# Patient Record
Sex: Male | Born: 1960 | Race: White | Hispanic: No | Smoking: Former smoker
Health system: Southern US, Community
[De-identification: ages and names within clinical notes are randomized; demographics above are authoritative.]

## PROBLEM LIST (undated history)

## (undated) DIAGNOSIS — Z9989 Dependence on other enabling machines and devices: Secondary | ICD-10-CM

## (undated) DIAGNOSIS — F909 Attention-deficit hyperactivity disorder, unspecified type: Secondary | ICD-10-CM

## (undated) DIAGNOSIS — F1010 Alcohol abuse, uncomplicated: Secondary | ICD-10-CM

## (undated) DIAGNOSIS — Z8719 Personal history of other diseases of the digestive system: Secondary | ICD-10-CM

## (undated) DIAGNOSIS — I209 Angina pectoris, unspecified: Secondary | ICD-10-CM

## (undated) DIAGNOSIS — J302 Other seasonal allergic rhinitis: Secondary | ICD-10-CM

## (undated) DIAGNOSIS — I701 Atherosclerosis of renal artery: Secondary | ICD-10-CM

## (undated) DIAGNOSIS — N186 End stage renal disease: Secondary | ICD-10-CM

## (undated) DIAGNOSIS — N281 Cyst of kidney, acquired: Secondary | ICD-10-CM

## (undated) DIAGNOSIS — E785 Hyperlipidemia, unspecified: Secondary | ICD-10-CM

## (undated) DIAGNOSIS — M009 Pyogenic arthritis, unspecified: Secondary | ICD-10-CM

## (undated) DIAGNOSIS — I739 Peripheral vascular disease, unspecified: Secondary | ICD-10-CM

## (undated) DIAGNOSIS — K759 Inflammatory liver disease, unspecified: Secondary | ICD-10-CM

## (undated) DIAGNOSIS — Z8711 Personal history of peptic ulcer disease: Secondary | ICD-10-CM

## (undated) DIAGNOSIS — G4733 Obstructive sleep apnea (adult) (pediatric): Secondary | ICD-10-CM

## (undated) DIAGNOSIS — G8929 Other chronic pain: Secondary | ICD-10-CM

## (undated) DIAGNOSIS — M545 Low back pain, unspecified: Secondary | ICD-10-CM

## (undated) DIAGNOSIS — M1990 Unspecified osteoarthritis, unspecified site: Secondary | ICD-10-CM

## (undated) DIAGNOSIS — J189 Pneumonia, unspecified organism: Secondary | ICD-10-CM

## (undated) DIAGNOSIS — I509 Heart failure, unspecified: Secondary | ICD-10-CM

## (undated) DIAGNOSIS — F32A Depression, unspecified: Secondary | ICD-10-CM

## (undated) DIAGNOSIS — I10 Essential (primary) hypertension: Secondary | ICD-10-CM

## (undated) DIAGNOSIS — F329 Major depressive disorder, single episode, unspecified: Secondary | ICD-10-CM

## (undated) DIAGNOSIS — F419 Anxiety disorder, unspecified: Secondary | ICD-10-CM

## (undated) DIAGNOSIS — Z992 Dependence on renal dialysis: Secondary | ICD-10-CM

## (undated) DIAGNOSIS — J449 Chronic obstructive pulmonary disease, unspecified: Secondary | ICD-10-CM

---
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
## 2011-03-24 ENCOUNTER — Encounter (HOSPITAL_COMMUNITY): Payer: Self-pay | Admitting: Nephrology

## 2011-03-24 ENCOUNTER — Inpatient Hospital Stay (HOSPITAL_COMMUNITY)
Admission: AD | Admit: 2011-03-24 | Discharge: 2011-03-29 | DRG: 292 | Disposition: A | Discharge status: Home / Self Care | Payer: Medicaid Other | Source: Ambulatory Visit | Attending: Internal Medicine | Admitting: Internal Medicine

## 2011-03-24 DIAGNOSIS — N183 Chronic kidney disease, stage 3 unspecified: Secondary | ICD-10-CM | POA: Diagnosis present

## 2011-03-24 DIAGNOSIS — I129 Hypertensive chronic kidney disease with stage 1 through stage 4 chronic kidney disease, or unspecified chronic kidney disease: Secondary | ICD-10-CM | POA: Diagnosis present

## 2011-03-24 DIAGNOSIS — M545 Low back pain, unspecified: Secondary | ICD-10-CM | POA: Diagnosis not present

## 2011-03-24 DIAGNOSIS — E785 Hyperlipidemia, unspecified: Secondary | ICD-10-CM | POA: Diagnosis present

## 2011-03-24 DIAGNOSIS — N179 Acute kidney failure, unspecified: Secondary | ICD-10-CM | POA: Diagnosis present

## 2011-03-24 DIAGNOSIS — N189 Chronic kidney disease, unspecified: Secondary | ICD-10-CM | POA: Diagnosis present

## 2011-03-24 DIAGNOSIS — Q619 Cystic kidney disease, unspecified: Secondary | ICD-10-CM

## 2011-03-24 DIAGNOSIS — J4489 Other specified chronic obstructive pulmonary disease: Secondary | ICD-10-CM | POA: Diagnosis present

## 2011-03-24 DIAGNOSIS — N186 End stage renal disease: Secondary | ICD-10-CM | POA: Insufficient documentation

## 2011-03-24 DIAGNOSIS — Z91199 Patient's noncompliance with other medical treatment and regimen due to unspecified reason: Secondary | ICD-10-CM

## 2011-03-24 DIAGNOSIS — I10 Essential (primary) hypertension: Secondary | ICD-10-CM | POA: Insufficient documentation

## 2011-03-24 DIAGNOSIS — I739 Peripheral vascular disease, unspecified: Secondary | ICD-10-CM | POA: Diagnosis present

## 2011-03-24 DIAGNOSIS — R109 Unspecified abdominal pain: Secondary | ICD-10-CM | POA: Diagnosis not present

## 2011-03-24 DIAGNOSIS — Z9119 Patient's noncompliance with other medical treatment and regimen: Secondary | ICD-10-CM

## 2011-03-24 DIAGNOSIS — I5023 Acute on chronic systolic (congestive) heart failure: Principal | ICD-10-CM | POA: Diagnosis present

## 2011-03-24 DIAGNOSIS — K5900 Constipation, unspecified: Secondary | ICD-10-CM | POA: Diagnosis not present

## 2011-03-24 DIAGNOSIS — F1010 Alcohol abuse, uncomplicated: Secondary | ICD-10-CM | POA: Insufficient documentation

## 2011-03-24 DIAGNOSIS — I509 Heart failure, unspecified: Secondary | ICD-10-CM | POA: Diagnosis present

## 2011-03-24 DIAGNOSIS — F1021 Alcohol dependence, in remission: Secondary | ICD-10-CM | POA: Diagnosis present

## 2011-03-24 DIAGNOSIS — J449 Chronic obstructive pulmonary disease, unspecified: Secondary | ICD-10-CM | POA: Diagnosis present

## 2011-03-24 DIAGNOSIS — F17200 Nicotine dependence, unspecified, uncomplicated: Secondary | ICD-10-CM | POA: Diagnosis present

## 2011-03-24 DIAGNOSIS — G894 Chronic pain syndrome: Secondary | ICD-10-CM | POA: Diagnosis present

## 2011-03-24 HISTORY — DX: Chronic obstructive pulmonary disease, unspecified: J44.9

## 2011-03-24 HISTORY — DX: Alcohol abuse, uncomplicated: F10.10

## 2011-03-24 HISTORY — DX: Hyperlipidemia, unspecified: E78.5

## 2011-03-24 HISTORY — DX: Peripheral vascular disease, unspecified: I73.9

## 2011-03-24 HISTORY — DX: Essential (primary) hypertension: I10

## 2011-03-24 LAB — CARDIAC PANEL(CRET KIN+CKTOT+MB+TROPI)
CK, MB: 5.2 ng/mL — ABNORMAL HIGH (ref 0.3–4.0)
Relative Index: 3.3 — ABNORMAL HIGH (ref 0.0–2.5)
Troponin I: <0.3 ng/mL (ref <0.30)

## 2011-03-24 LAB — CREATININE, SERUM
Creatinine, Ser: 3.3 mg/dL — ABNORMAL HIGH (ref 0.50–1.35)
GFR calc Af Amer: 24 mL/min — ABNORMAL LOW (ref >90)

## 2011-03-24 LAB — GLUCOSE, CAPILLARY
Glucose-Capillary: 173 mg/dL — ABNORMAL HIGH (ref 70–99)
Glucose-Capillary: 152 mg/dL — ABNORMAL HIGH (ref 70–99)

## 2011-03-24 LAB — CBC
MCV: 91.7 fL (ref 78.0–100.0)
Platelets: 222 10*3/uL (ref 150–400)
RBC: 4.1 MIL/uL — ABNORMAL LOW (ref 4.22–5.81)
RDW: 13.8 % (ref 11.5–15.5)
WBC: 9.2 10*3/uL (ref 4.0–10.5)

## 2011-03-24 MED ORDER — ISOSORBIDE DINITRATE 10 MG PO TABS
10.0000 mg | ORAL_TABLET | Freq: Three times a day (TID) | ORAL | Status: DC
  Administered 2011-03-24 – 2011-03-29 (×14): 10 mg via ORAL
  Filled 2011-03-24 (×19): qty 1

## 2011-03-24 MED ORDER — MORPHINE SULFATE 2 MG/ML IJ SOLN
2.0000 mg | INTRAMUSCULAR | Status: DC | PRN
  Administered 2011-03-24 – 2011-03-26 (×6): 2 mg via INTRAVENOUS
  Filled 2011-03-24 (×7): qty 1

## 2011-03-24 MED ORDER — ALBUTEROL SULFATE (5 MG/ML) 0.5% IN NEBU
2.5000 mg | INHALATION_SOLUTION | RESPIRATORY_TRACT | Status: DC | PRN
  Administered 2011-03-24 – 2011-03-28 (×5): 2.5 mg via RESPIRATORY_TRACT
  Filled 2011-03-24 (×5): qty 0.5

## 2011-03-24 MED ORDER — ZOLPIDEM TARTRATE 5 MG PO TABS
5.0000 mg | ORAL_TABLET | Freq: Every evening | ORAL | Status: DC | PRN
  Administered 2011-03-26 – 2011-03-29 (×4): 5 mg via ORAL
  Filled 2011-03-24 (×4): qty 1

## 2011-03-24 MED ORDER — DEXTROSE 5 % IV SOLN
160.0000 mg | Freq: Two times a day (BID) | INTRAVENOUS | Status: DC
  Administered 2011-03-24 – 2011-03-26 (×4): 160 mg via INTRAVENOUS
  Filled 2011-03-24 (×5): qty 16

## 2011-03-24 MED ORDER — HEPARIN SODIUM (PORCINE) 5000 UNIT/ML IJ SOLN
5000.0000 [IU] | Freq: Three times a day (TID) | INTRAMUSCULAR | Status: DC
Start: 2011-03-24 — End: 2011-03-29
  Administered 2011-03-24 – 2011-03-29 (×15): 5000 [IU] via SUBCUTANEOUS
  Filled 2011-03-24 (×17): qty 1

## 2011-03-24 MED ORDER — HYDRALAZINE HCL 10 MG PO TABS
10.0000 mg | ORAL_TABLET | Freq: Two times a day (BID) | ORAL | Status: DC
  Administered 2011-03-24 – 2011-03-29 (×10): 10 mg via ORAL
  Filled 2011-03-24 (×13): qty 1

## 2011-03-24 MED ORDER — CARVEDILOL 25 MG PO TABS
25.0000 mg | ORAL_TABLET | Freq: Two times a day (BID) | ORAL | Status: DC
  Administered 2011-03-24 – 2011-03-29 (×10): 25 mg via ORAL
  Filled 2011-03-24 (×12): qty 1

## 2011-03-24 MED ORDER — ALPRAZOLAM 0.5 MG PO TABS
0.5000 mg | ORAL_TABLET | Freq: Three times a day (TID) | ORAL | Status: DC | PRN
  Administered 2011-03-24 – 2011-03-29 (×10): 0.5 mg via ORAL
  Filled 2011-03-24 (×10): qty 1

## 2011-03-24 NOTE — Progress Notes (Signed)
Subjective:  Patient is a 50 yo WM with PMhx most notable for CHF, patient tells me EF of 25-30%.  He also has CKD stage 3 with a baseline creatinine of 1.5-2.  He has been hospitalized at [facility] with CHF, treated with a continuous IV lasix infusion.  His CHF is felt to be secondary to noncompliance.  The patient was seen by Dr. Caryn Section from [facility] at [facility] on 11/12.  Please see his note for details regarding past history/SH/FH.  At the time of consultation, pts creatinine was up to 4, but actually has trended down the last couple of days.  Today crt was 3.14 but with BUN of 121.   Pt right now is c/o thirst and still has significant LE edema, but states it is better than when he presented Objective Vital signs in last 24 hours: Filed Vitals:   03/24/11 1535  BP: 143/96  Pulse: 86  Temp: 97.4 F (36.3 C)  TempSrc: Oral  Resp: 18  Height: 5\' 8"  (1.727 m)  Weight: 78.4 kg (172 lb 13.5 oz)  SpO2: 99%   Weight change:  No intake or output data in the 24 hours ending 03/24/11 1625 Labs: Basic Metabolic Panel: No results found for this basename: NA:3,K:3,CL:3,CO2:3,GLUCOSE:3,BUN:3,CREATININE:3,CALCIUM:3,ALB:3,PHOS:3 in the last 168 hours Liver Function Tests: No results found for this basename: AST:3,ALT:3,ALKPHOS:3,BILITOT:3,PROT:3,ALBUMIN:3 in the last 168 hours No results found for this basename: LIPASE:3,AMYLASE:3 in the last 168 hours No results found for this basename: AMMONIA:3 in the last 168 hours CBC: No results found for this basename: WBC:3,NEUTROABS:3,HGB:3,HCT:3,MCV:5,PLT:3 in the last 168 hours Cardiac Enzymes: No results found for this basename: CKTOTAL:5,CKMB:5,CKMBINDEX:5,TROPONINI:5 in the last 168 hours CBG: No results found for this basename: GLUCAP:5 in the last 168 hours  Iron Studies: No results found for this basename: IRON,TIBC,TRANSFERRIN,FERRITIN in the last 72 hours Studies/Results: No results found. Medications:  From [facility] Neurontin 100 BID solumdrol 60 mg IV q6 Hydralazine 50 q 8 hrs atrovent Isordil 20 mg q 8 hrs Furosemide infusion Azithromycin 500 mg daily protonix 40 daily xopenex Coreg 25 mg BID PRN narcotics     have reviewed scheduled and prn medications.  Physical Exam: General: pleasant wm, NAD, c/o thirst Heart:RRR Lungs: actually mostly clear Abdomen:soft, nt Extremities:2-3 plus pitting edema, pt self reports scrotal edema   I Assessment/ Plan: Pt is a 50 y.o. yo male who was admitted on 03/24/2011 with  CHF exacerbation as well as A on CRF  Assessment/Plan: 1.CHF- actually has improved during his hospitalization, renal function appears to be trending in hte right direction as well.  I am going to continue to treat with IV lasix and monitor the situation  2. A of CRF- may be trending for the better. No acute indications for HD at this time. SPEP and UPEP were negative at [place].  Proteinuria was quantified at 2 grams 3. Anemia- the early hgb was good, will follow  Dang Mathison A   03/24/2011,4:25 PM  LOS: 0 days

## 2011-03-24 NOTE — H&P (Signed)
[name] is an 50 y.o. male.   Chief Complaint: Patient transferred from [facility] for worsening renal function HPI This is a 50 year old male with a history of chronic systolic congestive heart failure, noncompliance with medications, chronic kidney disease stage III, patient transfer from [facility] after being treated for congestive heart failure with continuous IV Lasix. The patient ran out of his coreg. He was not not monitoring his weight and he noticed over 20 pounds weight gain, also  shortness of breath and accordingly presented to [facility] ,at [facility] patient treated with IV lasix continuously and fluid restriction. Patient was on the Lasix at the 5 mg per hour and also placed on Coreg 12.5 mg twice daily but noticed elevated creatinine.accordingly patient transfer for better controll and monitoring of his kidney Past Medical History  Diagnosis Date  . Hypertension   . Congestive heart failure (CHF)   . Chronic kidney disease (CKD), stage III (moderate)   . Hyperlipidemia   . COPD (chronic obstructive pulmonary disease)   . Peripheral vascular disease   . ETOH abuse     No past surgical history on file.   family history: Positive for father who is deceased with pancreatic cancer and a mother with a brain tumor. He is prepared in a grandfather and uncle have died with congestive heart failure and heart disease, he has a strong family history of chronic alcoholism Social History: positive for tobacco abus1-2 packs per day., quit drinking 9 month ago, denies any illicit drug abuse Allergies: No Known Allergies Medication at home alprazolam 0.5 mg every 6 hour when necessary,frusemide 40 mg twice daily , Coreg 25 mg twice daily number, isosorbide dinitrate 10 mg twice daily Medications Prior to Admission  Medication Dose Route Frequency Provider Last Rate Last Dose  . furosemide (LASIX) 160 mg in dextrose 5 % 50 mL IVPB  160 mg Intravenous Q12H  Kellie A [place]       No current outpatient prescriptions on file as of 03/24/2011.    Results for orders placed during the hospital encounter of 03/24/11 (from the past 48 hour(s))  GLUCOSE, CAPILLARY     Status: Abnormal   Collection Time   03/24/11  3:36 PM      Component Value Range Comment   Glucose-Capillary 173 (*) 70 - 99 (mg/dL)   GLUCOSE, CAPILLARY     Status: Abnormal   Collection Time   03/24/11  5:31 PM      Component Value Range Comment   Glucose-Capillary 152 (*) 70 - 99 (mg/dL)    Comment 1 Documented in Chart      Comment 2 Notify RN      No results found.  Review of Systems  Constitutional: Positive for diaphoresis. Negative for fever, chills, weight loss and malaise/fatigue.  HENT: Negative for hearing loss, congestion, sore throat, neck pain, tinnitus and ear discharge.   Eyes: Negative for double vision, photophobia, pain and redness.  Respiratory: Positive for shortness of breath. Negative for cough, hemoptysis, sputum production, wheezing and stridor.   Cardiovascular: Positive for chest pain, palpitations, orthopnea, leg swelling and PND.  Gastrointestinal: Negative for heartburn, nausea, vomiting, abdominal pain, diarrhea and melena.  Genitourinary: Negative for dysuria, urgency, frequency, hematuria and flank pain.  Musculoskeletal: Negative for myalgias, back pain, joint pain and falls.  Skin: Negative for itching and rash.  Neurological: Negative for dizziness, tingling, tremors, speech change, focal weakness, seizures, loss of consciousness, weakness and headaches.  Endo/Heme/Allergies: Positive for environmental  allergies. Negative for polydipsia. Does not bruise/bleed easily.    Blood pressure 147/89, pulse 82, temperature 97.6 F (36.4 C), temperature source Oral, resp. rate 19, height 5\' 8"  (1.727 m), weight 78.4 kg (172 lb 13.5 oz), SpO2 99.00%. Physical Exam  Constitutional:       On mild respiratory distress, on nasal cannula  HENT:    Head: Normocephalic and atraumatic.  Right Ear: External ear normal.  Left Ear: External ear normal.  Eyes: Pupils are equal, round, and reactive to light. Right eye exhibits no discharge. Left eye exhibits no discharge.  Neck: Normal range of motion. JVD present. No tracheal deviation present. No thyromegaly present.  Cardiovascular: Normal rate, regular rhythm and normal heart sounds.  Exam reveals no gallop and no friction rub.   No murmur heard. Respiratory: He is in respiratory distress. He has no wheezes. He has rales. He exhibits no tenderness.       Bilatera]l rales  GI: Bowel sounds are normal. He exhibits no distension and no mass. There is no tenderness. There is no rebound and no guarding.  Musculoskeletal: He exhibits edema.       Bilateral oedema , stage4  Lymphadenopathy:    He has no cervical adenopathy.  Skin: He is diaphoretic.     Assessment/Plan 1-acute on chronic congestive heart failure agree with the Dr. Edmonia Lynch evaluation would continue with Lasix IV, coreg 12.5 mg twice daily, Imdur 10 mg bid, would get 2-D echo on 5 inpatient cardiac enzyme, EKG would be ordered . 2-acute and chronic renal failure petr the nephrology team, SPEP, UPEP was negative the [facility]  3- COPD with ongoing tobacco abuse counseling, and nebs , no steroid 4-hypertension resume coreg and imdur as compared note from [facility] the patient has an EF of 27% I would like to add hydralazine to his medications and currently would hold aldactonepending improvement  kidney function.   Kimyata Milich I. 03/24/2011, 6:12 PM

## 2011-03-25 ENCOUNTER — Inpatient Hospital Stay (HOSPITAL_COMMUNITY): Payer: Medicaid Other

## 2011-03-25 ENCOUNTER — Other Ambulatory Visit: Payer: Self-pay

## 2011-03-25 ENCOUNTER — Encounter (HOSPITAL_COMMUNITY): Payer: Self-pay | Admitting: *Deleted

## 2011-03-25 DIAGNOSIS — I059 Rheumatic mitral valve disease, unspecified: Secondary | ICD-10-CM

## 2011-03-25 LAB — CBC
HCT: 38.4 % — ABNORMAL LOW (ref 39.0–52.0)
Platelets: 222 10*3/uL (ref 150–400)
RBC: 4.17 MIL/uL — ABNORMAL LOW (ref 4.22–5.81)
RDW: 13.7 % (ref 11.5–15.5)
WBC: 9.9 10*3/uL (ref 4.0–10.5)

## 2011-03-25 LAB — COMPREHENSIVE METABOLIC PANEL
ALT: 47 U/L (ref 0–53)
AST: 19 U/L (ref 0–37)
CO2: 36 mEq/L — ABNORMAL HIGH (ref 19–32)
Calcium: 8.4 mg/dL (ref 8.4–10.5)
Creatinine, Ser: 3.21 mg/dL — ABNORMAL HIGH (ref 0.50–1.35)
GFR calc Af Amer: 24 mL/min — ABNORMAL LOW (ref >90)
GFR calc non Af Amer: 21 mL/min — ABNORMAL LOW (ref >90)
Sodium: 139 mEq/L (ref 135–145)
Total Protein: 5.7 g/dL — ABNORMAL LOW (ref 6.0–8.3)

## 2011-03-25 LAB — GLUCOSE, CAPILLARY
Glucose-Capillary: 133 mg/dL — ABNORMAL HIGH (ref 70–99)
Glucose-Capillary: 140 mg/dL — ABNORMAL HIGH (ref 70–99)
Glucose-Capillary: 142 mg/dL — ABNORMAL HIGH (ref 70–99)
Glucose-Capillary: 87 mg/dL (ref 70–99)

## 2011-03-25 LAB — PHOSPHORUS: Phosphorus: 5.7 mg/dL — ABNORMAL HIGH (ref 2.3–4.6)

## 2011-03-25 LAB — CARDIAC PANEL(CRET KIN+CKTOT+MB+TROPI)
CK, MB: 5.6 ng/mL — ABNORMAL HIGH (ref 0.3–4.0)
Troponin I: <0.3 ng/mL (ref <0.30)

## 2011-03-25 MED ORDER — INFLUENZA VIRUS VACC SPLIT PF IM SUSP
0.5000 mL | INTRAMUSCULAR | Status: AC
  Administered 2011-03-26: 0.5 mL via INTRAMUSCULAR
  Filled 2011-03-25: qty 0.5

## 2011-03-25 MED ORDER — HYDROCODONE-ACETAMINOPHEN 5-325 MG PO TABS
1.0000 | ORAL_TABLET | ORAL | Status: DC | PRN
  Administered 2011-03-25: 1 via ORAL
  Administered 2011-03-26 – 2011-03-29 (×11): 2 via ORAL
  Filled 2011-03-25 (×5): qty 2
  Filled 2011-03-25: qty 1
  Filled 2011-03-25 (×7): qty 2

## 2011-03-25 MED ORDER — ASPIRIN EC 325 MG PO TBEC
325.0000 mg | DELAYED_RELEASE_TABLET | Freq: Every day | ORAL | Status: DC
  Administered 2011-03-25 – 2011-03-29 (×5): 325 mg via ORAL
  Filled 2011-03-25 (×5): qty 1

## 2011-03-25 MED ORDER — PNEUMOCOCCAL VAC POLYVALENT 25 MCG/0.5ML IJ INJ
0.5000 mL | INJECTION | INTRAMUSCULAR | Status: AC
  Administered 2011-03-26: 0.5 mL via INTRAMUSCULAR
  Filled 2011-03-25: qty 0.5

## 2011-03-25 NOTE — Progress Notes (Signed)
Patient c/o chest pain at 1830 of a "2" of "10" on pain scale.  The pain was non radiating and sharp that was much more painful upon palpation of the muscles in the middle of the chest.  Vss, 12 lead ekg was done and dr elsaid was made aware and vicoden was given x 1.  Continue to monitor

## 2011-03-25 NOTE — Progress Notes (Signed)
Utilization review complete 

## 2011-03-25 NOTE — Progress Notes (Signed)
Inpatient Diabetes Program Recommendations  AACE/ADA: New Consensus Statement on Inpatient Glycemic Control (2009)  Target Ranges:  Prepandial:   less than 140 mg/dL      Peak postprandial:   less than 180 mg/dL (1-2 hours)      Critically ill patients:  140 - 180 mg/dL   Reason for Visit: monitoring CBGs and no correction insulin    Consider starting Novolog sensitive scale TID for elevated CBGs.

## 2011-03-25 NOTE — Progress Notes (Signed)
  Echocardiogram 2D Echocardiogram has been performed.  Dewitt Hoes, RDCS 03/25/2011, 11:23 AM

## 2011-03-25 NOTE — Progress Notes (Signed)
Subjective: Today patient complained of abdominal pain, last bowel movement was yesterday, tolerated a diet well, no evidence of nausea or vomiting, also complained of pain on his both lower extremities and back. Patient informed me that he had an MRI of his back done [place]. Looking at the the report from [place] I don't see any MRI of his back. Patient able to mobilize both lower extremities he is making good urine.  Objective: Vital signs in last 24 hours: Filed Vitals:   03/24/11 2100 03/25/11 0553 03/25/11 0900 03/25/11 1400  BP: 168/117 139/91 160/80 144/70  Pulse: 97 94 87 80  Temp: 97.6 F (36.4 C) 97.6 F (36.4 C) 98 F (36.7 C) 97.5 F (36.4 C)  TempSrc: Oral  Oral Oral  Resp: 20 20 20 20   Height:      Weight:  78.6 kg (173 lb 4.5 oz)    SpO2: 98% 100% 100% 100%   Weight change:   Intake/Output Summary (Last 24 hours) at 03/25/11 1600 Last data filed at 03/25/11 1500  Gross per 24 hour  Intake    900 ml  Output    725 ml  Net    175 ml  On examination  The patient not on active respiratory distress,  Pupil equally "reactive to light and accommodation  heart S1 and S2 no gallop Lungs no Rales no wheezing Abdomen distended tender to palpation over bowel sounds present no rebound no guarding, ecchymosis to the suprapubic area Extremities bilateral lower limb edema improve since yesterday CNS patient awake alert oriented x3 moves of both lower extremities, pulses intact bilateral back examined no point tenderness. Lab Results:  [facility] 03/25/11 0308 03/24/11 1837  NA 139 --  K 3.6 --  CL 89* --  CO2 36* --  GLUCOSE 142* --  BUN 111* --  CREATININE 3.21* 3.30*  CALCIUM 8.4 --  MG 2.1 --  PHOS 5.7* --    Basename 03/25/11 0308  AST 19  ALT 47  ALKPHOS 58  BILITOT 0.5  PROT 5.7*  ALBUMIN 3.1*   No results found for this basename: LIPASE:2,AMYLASE:2 in the last 72 hours  Basename 03/25/11 0308 03/24/11 1837  WBC 9.9 9.2  NEUTROABS -- --  HGB 12.8*  12.3*  HCT 38.4* 37.6*  MCV 92.1 91.7  PLT 222 222    Basename 03/25/11 1037 03/25/11 0308 03/24/11 1832  CKTOTAL 151 127 160  CKMB 5.6* 5.1* 5.2*  CKMBINDEX -- -- --  TROPONINI <0.30 <0.30 <0.30   Micro Results: Recent Results (from the past 240 hour(s))  MRSA PCR SCREENING     Status: Normal   Collection Time   03/24/11  3:58 PM      Component Value Range Status Comment   MRSA by PCR NEGATIVE  NEGATIVE  Final     Studies/Results: No results found.  Medications: I have reviewed the patient's current medications. Scheduled Meds:   . carvedilol  25 mg Oral BID  . furosemide  160 mg Intravenous Q12H  . heparin  5,000 Units Subcutaneous Q8H  . hydrALAZINE  10 mg Oral BID  . influenza  inactive virus vaccine  0.5 mL Intramuscular Tomorrow-1000  . isosorbide dinitrate  10 mg Oral TID  . pneumococcal 23 valent vaccine  0.5 mL Intramuscular Tomorrow-1000   Continuous Infusions:  PRN Meds:.albuterol, ALPRAZolam, morphine injection, zolpidem  Assessment/Plan: Principal Problem:  *Congestive heart failure (CHF) acute on chronic systolic CHF with EF 30%, history of of alcoholism, has never had cardiac caths in  the past would get more history from his cardiologist, may need a cardiac cath in the hospital before discharge, cardiac enzymes currently negative and patient denies any chest pain, would continue with Coreg 25 mg twice daily, hydralazine 10 mg twice daily, Imdur 2 or 3 times daily, a CEA inhibitor on hold secondary to renal insufficiency. 2-acute on chronic renal insufficiency per nephrology team 3-abdomen  pain and distention patient has a history of alcoholism, cannot exclude ascites s and liver cirrhosis I would proceed with ultrasound of the abdomen, to give [place] a picture about the liver and kidney, would also get KUB to exclude any ileus 4- patient also complained of back pain he admitted he has an MRI done at [facility] we will get the report, would get ESR and  C-reactive protein. 5-COPD currently no wheezing would continue with nebs no need for steroids at this time. -  LOS: 1 day  Aaralyn Kil I. 03/25/2011, 4:00 PM

## 2011-03-25 NOTE — Progress Notes (Signed)
Subjective:  Not much UOP recorded, but patient said "i filled that jug" , BUN and creatinine not that different from [facility] where it was trending down.   Objective Vital signs in last 24 hours: Filed Vitals:   03/24/11 1813 03/24/11 2100 03/25/11 0553 03/25/11 0900  BP: 158/100 168/117 139/91 160/80  Pulse: 100 97 94 87  Temp: 97.4 F (36.3 C) 97.6 F (36.4 C) 97.6 F (36.4 C) 98 F (36.7 C)  TempSrc: Oral Oral  Oral  Resp: 20 20 20 20   Height:      Weight:   78.6 kg (173 lb 4.5 oz)   SpO2: 98% 98% 100% 100%   Weight change:   Intake/Output Summary (Last 24 hours) at 03/25/11 1045 Last data filed at 03/25/11 0900  Gross per 24 hour  Intake    780 ml  Output    225 ml  Net    555 ml   Labs: Basic Metabolic Panel:  Lab 03/25/11 1610 03/24/11 1837  NA 139 --  K 3.6 --  CL 89* --  CO2 36* --  GLUCOSE 142* --  BUN 111* --  CREATININE 3.21* 3.30*  CALCIUM 8.4 --  ALB -- --  PHOS 5.7* --   Liver Function Tests:  Lab 03/25/11 0308  AST 19  ALT 47  ALKPHOS 58  BILITOT 0.5  PROT 5.7*  ALBUMIN 3.1*   No results found for this basename: LIPASE:3,AMYLASE:3 in the last 168 hours No results found for this basename: AMMONIA:3 in the last 168 hours CBC:  Lab 03/25/11 0308 03/24/11 1837  WBC 9.9 9.2  NEUTROABS -- --  HGB 12.8* 12.3*  HCT 38.4* 37.6*  MCV 92.1 91.7  PLT 222 222   Cardiac Enzymes:  Lab 03/25/11 0308 03/24/11 1832  CKTOTAL 127 160  CKMB 5.1* 5.2*  CKMBINDEX -- --  TROPONINI <0.30 <0.30   CBG:  Lab 03/25/11 0437 03/25/11 0035 03/24/11 2110 03/24/11 1731 03/24/11 1536  GLUCAP 133* 142* 272* 152* 173*    Iron Studies: No results found for this basename: IRON,TIBC,TRANSFERRIN,FERRITIN in the last 72 hours Studies/Results: No results found. Medications: Infusions:    Scheduled Medications:    . carvedilol  25 mg Oral BID  . furosemide  160 mg Intravenous Q12H  . heparin  5,000 Units Subcutaneous Q8H  . hydrALAZINE  10 mg Oral BID  .  isosorbide dinitrate  10 mg Oral TID    have reviewed scheduled and prn medications.  Physical Exam: General: alert, still on O2 but not that symptomatic Heart: RRR Lungs: decreased BS at bases Abdomen: slightly distended and tender per pt Extremities: 1-2 + edema   I Assessment/ Plan: Pt is a 50 y.o. yo male who was admitted on 03/24/2011 as transfer from [facility] with  CHF and A on chronic renal failure  Assessment/Plan: 1. CHF- although not in records pt states is making urine, i currently have him on lasix 160 mg q 12.  Will keep same dose for now. 2. CKD with A on CKD- was trending better before transfer from [facility].  Will continue with medical management for now, no indications for HD  3. Anemia- not an issue    Nakya Weyand A   03/25/2011,10:45 AM  LOS: 1 day

## 2011-03-26 ENCOUNTER — Other Ambulatory Visit: Payer: Self-pay

## 2011-03-26 LAB — GLUCOSE, CAPILLARY
Glucose-Capillary: 105 mg/dL — ABNORMAL HIGH (ref 70–99)
Glucose-Capillary: 123 mg/dL — ABNORMAL HIGH (ref 70–99)
Glucose-Capillary: 138 mg/dL — ABNORMAL HIGH (ref 70–99)
Glucose-Capillary: 143 mg/dL — ABNORMAL HIGH (ref 70–99)
Glucose-Capillary: 94 mg/dL (ref 70–99)
Glucose-Capillary: 98 mg/dL (ref 70–99)

## 2011-03-26 LAB — SEDIMENTATION RATE: Sed Rate: 0 mm/hr (ref 0–16)

## 2011-03-26 LAB — C-REACTIVE PROTEIN: CRP: 0.15 mg/dL — ABNORMAL LOW (ref <0.60)

## 2011-03-26 LAB — CBC
Hemoglobin: 13.5 g/dL (ref 13.0–17.0)
MCHC: 32.6 g/dL (ref 30.0–36.0)
Platelets: 240 10*3/uL (ref 150–400)

## 2011-03-26 MED ORDER — CALCIUM CARBONATE ANTACID 500 MG PO CHEW
1.0000 | CHEWABLE_TABLET | Freq: Two times a day (BID) | ORAL | Status: DC | PRN
  Administered 2011-03-26 – 2011-03-27 (×2): 200 mg via ORAL
  Filled 2011-03-26: qty 1

## 2011-03-26 MED ORDER — FUROSEMIDE 80 MG PO TABS
80.0000 mg | ORAL_TABLET | Freq: Two times a day (BID) | ORAL | Status: DC
  Administered 2011-03-26 – 2011-03-29 (×7): 80 mg via ORAL
  Filled 2011-03-26 (×9): qty 1

## 2011-03-26 MED ORDER — NICOTINE 21 MG/24HR TD PT24
21.0000 mg | MEDICATED_PATCH | Freq: Every day | TRANSDERMAL | Status: DC
  Administered 2011-03-26 – 2011-03-27 (×2): 21 mg via TRANSDERMAL
  Filled 2011-03-26 (×4): qty 1

## 2011-03-26 NOTE — Progress Notes (Signed)
PCP- [facility] [place]  03/26/11- 1530-[place] Scheiderer Zenda Alpers RN, BSN [phone] Spoke with pt at bedside -per conversation pt states that he goes to the [facility] in [place] and gets his medications from [place]. He states that is is sometimes difficult affording his medications that sometimes cost up to $150/month. Per pharmacy pt is eligible for assistance with medications if needed at discharge. Pt has had some CHF f/u in the past put does not follow up with a Cardiologist on a regular basis due to cost. Pt repoorts that he has applied for Medicaid and Disability.

## 2011-03-26 NOTE — Progress Notes (Signed)
Subjective: Complaining of abdominal pain, diffuse, no nausea no vomiting he has 1 bowel movement yesterday, he pass gas, lower extremity swelling significantly resolving responding to the IV Lasix  Objective: Vital signs in last 24 hours: Filed Vitals:   15-Apr-2011 2156 03/26/11 0300 03/26/11 0900 03/26/11 1415  BP: 141/84 142/98 151/74 124/78  Pulse: 77 83 70 74  Temp:  97.3 F (36.3 C) 97.7 F (36.5 C) 98.2 F (36.8 C)  TempSrc:  Oral Oral Oral  Resp: 20 18 19 18   Height:      Weight:      SpO2: 96% 100% 94% 94%   Weight change:   Intake/Output Summary (Last 24 hours) at 03/26/11 1534 Last data filed at 03/26/11 1400  Gross per 24 hour  Intake   2140 ml  Output   4101 ml  Net  -1961 ml   The patient not on active respiratory distress, Pupil equally "reactive to light and accommodation  heart S1 and S2 no gallop  Lungs no Rales no wheezing  Abdomen distended tender to palpation over bowel sounds present no rebound no guarding, ecchymosis to the suprapubic area  Extremities bilateral lower limb edema improving CNS patient awake alert oriented x3 moves of both lower extremities, pulses intact bilateral back examined no point tenderness    Lab Results:  [facility] 15-Apr-2011 0308 03/24/11 1837  NA 139 --  K 3.6 --  CL 89* --  CO2 36* --  GLUCOSE 142* --  BUN 111* --  CREATININE 3.21* 3.30*  CALCIUM 8.4 --  MG 2.1 --  PHOS 5.7* --    Basename 2011-04-15 0308  AST 19  ALT 47  ALKPHOS 58  BILITOT 0.5  PROT 5.7*  ALBUMIN 3.1*   No results found for this basename: LIPASE:2,AMYLASE:2 in the last 72 hours  Basename 03/26/11 0605 15-Apr-2011 0308  WBC 13.2* 9.9  NEUTROABS -- --  HGB 13.5 12.8*  HCT 41.4 38.4*  MCV 94.3 92.1  PLT 240 222    Basename 2011-04-15 1037 15-Apr-2011 0308 03/24/11 1832  CKTOTAL 151 127 160  CKMB 5.6* 5.1* 5.2*  CKMBINDEX -- -- --  TROPONINI <0.30 <0.30 <0.30    Micro Results: Recent Results (from the past 240 hour(s))  MRSA PCR  SCREENING     Status: Normal   Collection Time   03/24/11  3:58 PM      Component Value Range Status Comment   MRSA by PCR NEGATIVE  NEGATIVE  Final     Studies/Results: Dg Abd 2 Views  2011-04-15  *RADIOLOGY REPORT*  Clinical Data: 50 year old male with abdominal pain.  ABDOMEN - 2 VIEW  Comparison: MRI abdomen 03/23/2011.  Findings: Nonobstructed bowel gas pattern.  Lung bases are clear. Degenerative changes in the spine.  Bulky osteophytosis at the L5 S1 level felt to explain increased density overlying the left sacral ala. No acute osseous abnormality identified.  No pneumoperitoneum.  IMPRESSION: Nonobstructed bowel gas pattern, no free air.  Original Report Authenticated By: Harley Hallmark, M.D.    Medications: I have reviewed the patient's current medications. Scheduled Meds:   . aspirin EC  325 mg Oral Daily  . carvedilol  25 mg Oral BID  . furosemide  80 mg Oral BID  . heparin  5,000 Units Subcutaneous Q8H  . hydrALAZINE  10 mg Oral BID  . influenza  inactive virus vaccine  0.5 mL Intramuscular Tomorrow-1000  . isosorbide dinitrate  10 mg Oral TID  . pneumococcal 23 valent vaccine  0.5  mL Intramuscular Tomorrow-1000  . DISCONTD: furosemide  160 mg Intravenous Q12H   Continuous Infusions:  PRN Meds:.albuterol, ALPRAZolam, calcium carbonate, HYDROcodone-acetaminophen, morphine injection, zolpidem  Assessment/Plan: Principal Problem: Congestive heart failure (CHF) acute on chronic systolic congestive heart failure, with noncompliance to medications, patient currently respond to high-dose IV Lasix, he is on beta blocker , ACE inhibitor on hold secondary to worsening renal function, generally  Improving, will arrange follow up with his cardiology as out patient. EF  30%(ischemic cardiomyopathy versus ETOH cardiomyopathy) 2- ARF on CKD. Ultrasound of the kidney done [facility]   , medical renal disease monospecific calls. 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 3- COPD: NEBS , breathing better wean off oxygen 4-abdominal distension. KUB NEG.  6- COMPLEX  Renal cyst sp MRI OF ABDOMIN did show Bosniak 36F lesion recommend follow up 6 month 7- chest pain  repeat EKG  Seems reproducible, cardiac enzyme   LOS: 2 days  Zanai Mallari I. 03/26/2011, 3:34 PM

## 2011-03-26 NOTE — Progress Notes (Signed)
Subjective: Interval History: has complaints of constipation and lower back pain.  Objective: Vital signs in last 24 hours:  Temp:  [97 F (36.1 C)-98 F (36.7 C)] 97.3 F (36.3 C) (11/16 0300) Pulse Rate:  [77-87] 83  (11/16 0300) Resp:  [18-20] 18  (11/16 0300) BP: (135-158)/(62-105) 142/98 mmHg (11/16 0300) SpO2:  [96 %-100 %] 100 % (11/16 0300)  Weight change:   Intake/Output: I/O last 3 completed shifts: In: 2080 [P.O.:1980; IV Piggyback:100] Out: 4575 [Urine:4575]   Intake/Output this shift:     CVS- RRR no murmurs RS- CTA no rales ABD- BS present soft non-distended EXT- no edema  Lab Results:  [facility] 03/26/11 0605 03-28-11 0308 03/24/11 1837  WBC 13.2* 9.9 9.2  HGB 13.5 12.8* 12.3*  HCT 41.4 38.4* 37.6*  PLT 240 222 222   BME Basename 2011/03/28 0308 03/24/11 1837  NA 139 --  K 3.6 --  CL 89* --  CO2 36* --  GLUCOSE 142* --  BUN 111* --  CREATININE 3.21* 3.30*  CALCIUM 8.4 --  PHOS 5.7* --   LFT  Basename Mar 28, 2011 0308  PROT 5.7*  ALBUMIN 3.1*  AST 19  ALT 47  ALKPHOS 58  BILITOT 0.5  BILIDIR --  IBILI --   PT/INR No results found for this basename: LABPROT:2,INR:2 in the last 72 hours Hepatitis Panel No results found for this basename: HEPBSAG,HCVAB,HEPAIGM,HEPBIGM in the last 72 hours  Studies/Results: Dg Abd 2 Views  2011/03/28  *RADIOLOGY REPORT*  Clinical Data: 50 year old male with abdominal pain.  ABDOMEN - 2 VIEW  Comparison: MRI abdomen 03/23/2011.  Findings: Nonobstructed bowel gas pattern.  Lung bases are clear. Degenerative changes in the spine.  Bulky osteophytosis at the L5 S1 level felt to explain increased density overlying the left sacral ala. No acute osseous abnormality identified.  No pneumoperitoneum.  IMPRESSION: Nonobstructed bowel gas pattern, no free air.  Original Report Authenticated By: Harley Hallmark, M.D.    I have reviewed the patient's current medications.  Assessment/Plan: 1. Acute on Chronic renal  disease Creatinine slowly trending down. 2. Good diuresis output of 2 L with high dose lasix  Will decrease lasix and change to oral 3. Electrolytes stable 4.Continue to follow labs, daily weights and ins and outs awaiting result of renal Ultrasound  LOS: 2 Jalexia Lalli W @TODAY @9 :38 AM

## 2011-03-27 ENCOUNTER — Inpatient Hospital Stay (HOSPITAL_COMMUNITY): Payer: Medicaid Other

## 2011-03-27 LAB — RENAL FUNCTION PANEL
Albumin: 3.1 g/dL — ABNORMAL LOW (ref 3.5–5.2)
BUN: 94 mg/dL — ABNORMAL HIGH (ref 6–23)
CO2: 35 mEq/L — ABNORMAL HIGH (ref 19–32)
Chloride: 90 mEq/L — ABNORMAL LOW (ref 96–112)
GFR calc non Af Amer: 22 mL/min — ABNORMAL LOW (ref >90)
Potassium: 3.8 mEq/L (ref 3.5–5.1)

## 2011-03-27 LAB — GLUCOSE, CAPILLARY: Glucose-Capillary: 131 mg/dL — ABNORMAL HIGH (ref 70–99)

## 2011-03-27 LAB — CBC
HCT: 39 % (ref 39.0–52.0)
Hemoglobin: 12.7 g/dL — ABNORMAL LOW (ref 13.0–17.0)
MCH: 30.5 pg (ref 26.0–34.0)
MCHC: 32.6 g/dL (ref 30.0–36.0)
RDW: 13.8 % (ref 11.5–15.5)

## 2011-03-27 NOTE — Progress Notes (Signed)
Subjective: Complaining of abdominal pain, and lower extremity pain, he has good bowel movement, no nausea no vomiting, he pass gases. No chest pain or shortness of breath, making good urine and his kidney function remained stable  Objective: Vital signs in last 24 hours: Filed Vitals:   03/26/11 2300 03/27/11 0500 03/27/11 0503 03/27/11 1003  BP: 159/101  151/108 146/96  Pulse: 91  87 79  Temp:   97.8 F (36.6 C) 98.1 F (36.7 C)  TempSrc:   Oral Oral  Resp: 17  20 19   Height:      Weight:  79 kg (174 lb 2.6 oz)    SpO2:   98% 96%   Weight change:   Intake/Output Summary (Last 24 hours) at 03/27/11 1603 Last data filed at 03/27/11 0900  Gross per 24 hour  Intake    240 ml  Output    850 ml  Net   -610 ml    Awake alert oriented, not on respiratory distress Lung normal vesicular breathing no wheezing no relative Heart S1 and S2 no no gallops Abdomen distended, no rebound tenderness, bowel sounds present ecchymosis on the suprapubic area   extremities edema almost resolved and peripheral pulses are intact on both lower extremities Lab Results:  [facility] 03/27/11 0820 April 06, 2011 0308  NA 137 139  K 3.8 3.6  CL 90* 89*  CO2 35* 36*  GLUCOSE 112* 142*  BUN 94* 111*  CREATININE 3.11* 3.21*  CALCIUM 7.5* 8.4  MG -- 2.1  PHOS 4.0 5.7*    Basename 03/27/11 0820 2011-04-06 0308  AST -- 19  ALT -- 47  ALKPHOS -- 58  BILITOT -- 0.5  PROT -- 5.7*  ALBUMIN 3.1* 3.1*   No results found for this basename: LIPASE:2,AMYLASE:2 in the last 72 hours  Basename 03/26/11 0605 04-06-2011 0308  WBC 13.2* 9.9  NEUTROABS -- --  HGB 13.5 12.8*  HCT 41.4 38.4*  MCV 94.3 92.1  PLT 240 222    Basename 2011/04/06 1037 April 06, 2011 0308 03/24/11 1832  CKTOTAL 151 127 160  CKMB 5.6* 5.1* 5.2*  CKMBINDEX -- -- --  TROPONINI <0.30 <0.30 <0.30   No results found for this basename: POCBNP:3 in the last 72 hours No results found for this basename: DDIMER:2 in the last 72 hours  Basename  03/26/11 0605  HGBA1C 5.8*   No results found for this basename: CHOL:2,HDL:2,LDLCALC:2,TRIG:2,CHOLHDL:2,LDLDIRECT:2 in the last 72 hours No results found for this basename: TSH,T4TOTAL,FREET3,T3FREE,THYROIDAB in the last 72 hours No results found for this basename: VITAMINB12:2,FOLATE:2,FERRITIN:2,TIBC:2,IRON:2,RETICCTPCT:2 in the last 72 hours  Micro Results: Recent Results (from the past 240 hour(s))  MRSA PCR SCREENING     Status: Normal   Collection Time   03/24/11  3:58 PM      Component Value Range Status Comment   MRSA by PCR NEGATIVE  NEGATIVE  Final     Studies/Results: Dg Abd 2 Views  April 06, 2011  *RADIOLOGY REPORT*  Clinical Data: 50 year old male with abdominal pain.  ABDOMEN - 2 VIEW  Comparison: MRI abdomen 03/23/2011.  Findings: Nonobstructed bowel gas pattern.  Lung bases are clear. Degenerative changes in the spine.  Bulky osteophytosis at the L5 S1 level felt to explain increased density overlying the left sacral ala. No acute osseous abnormality identified.  No pneumoperitoneum.  IMPRESSION: Nonobstructed bowel gas pattern, no free air.  Original Report Authenticated By: Harley Hallmark, M.D.    Medications: I have reviewed the patient's current medications. Scheduled Meds:   . aspirin EC  325 mg Oral Daily  . carvedilol  25 mg Oral BID  . furosemide  80 mg Oral BID  . heparin  5,000 Units Subcutaneous Q8H  . hydrALAZINE  10 mg Oral BID  . influenza  inactive virus vaccine  0.5 mL Intramuscular Tomorrow-1000  . isosorbide dinitrate  10 mg Oral TID  . nicotine  21 mg Transdermal Daily  . pneumococcal 23 valent vaccine  0.5 mL Intramuscular Tomorrow-1000   Continuous Infusions:  PRN Meds:.albuterol, ALPRAZolam, calcium carbonate, HYDROcodone-acetaminophen, morphine injection, zolpidem  Assessment/Plan: 1-acute on chronic systolic congestive heart failure, respond to high dose of Lasix, on beta blocker, hydralazine, Imdur. His congestive heart failure is chronic,  he had a cardiologist and he need to follow up with him as an outpatient and more than that he need to be compliant with medication 2-acute on chronic renal failure improved 3-abdomen pain and distention, would repeat KUB, half MRI of the abdomen before which was negative tamylase and lipase, patient denies any pain when he is eating but he feel his abdomin is full. Possible truncal obesity? 4-ongoing tobacco abuse alcohol abuse counseling 5- copd nebs  LOS: 3 days likely dc home soon Desera Graffeo I. 03/27/2011, 4:03 PM

## 2011-03-27 NOTE — Progress Notes (Signed)
Subjective:  Pt feeling better.  Leg swelling down, had BM first time.  Walking some, good subjective UOP.    Objective Vital signs in last 24 hours: Filed Vitals:   03/26/11 2300 03/27/11 0500 03/27/11 0503 03/27/11 1003  BP: 159/101  151/108 146/96  Pulse: 91  87 79  Temp:   97.8 F (36.6 C) 98.1 F (36.7 C)  TempSrc:   Oral Oral  Resp: 17  20 19   Height:      Weight:  79 kg (174 lb 2.6 oz)    SpO2:   98% 96%   Weight change:   Intake/Output Summary (Last 24 hours) at 03/27/11 1554 Last data filed at 03/27/11 0900  Gross per 24 hour  Intake    240 ml  Output    850 ml  Net   -610 ml   Labs: Basic Metabolic Panel:  Lab 03/27/11 9147 2011-03-28 0308 03/24/11 1837  NA 137 139 --  K 3.8 3.6 --  CL 90* 89* --  CO2 35* 36* --  GLUCOSE 112* 142* --  BUN 94* 111* --  CREATININE 3.11* 3.21* 3.30*  ALB -- -- --  CALCIUM 7.5* 8.4 --  PHOS 4.0 5.7* --   Liver Function Tests:  Lab 03/27/11 0820 28-Mar-2011 0308  AST -- 19  ALT -- 47  ALKPHOS -- 58  BILITOT -- 0.5  PROT -- 5.7*  ALBUMIN 3.1* 3.1*   No results found for this basename: LIPASE:3,AMYLASE:3 in the last 168 hours No results found for this basename: AMMONIA:3 in the last 168 hours CBC:  Lab 03/26/11 0605 03-28-11 0308 03/24/11 1837  WBC 13.2* 9.9 9.2  NEUTROABS -- -- --  HGB 13.5 12.8* 12.3*  HCT 41.4 38.4* 37.6*  MCV 94.3 92.1 91.7  PLT 240 222 222   Cardiac Enzymes:  Lab March 28, 2011 1037 03-28-11 0308 03/24/11 1832  CKTOTAL 151 127 160  CKMB 5.6* 5.1* 5.2*  CKMBINDEX -- -- --  TROPONINI <0.30 <0.30 <0.30   CBG:  Lab 03/27/11 1227 03/27/11 0754 03/26/11 2129 03/26/11 1709 03/26/11 1141  GLUCAP 101* 112* 138* 123* 143*    Iron Studies: No results found for this basename: IRON:30,TIBC:30,TRANSFERRIN:30,FERRITIN:30 in the last 168 hours Studies/Results: Dg Abd 2 Views  March 28, 2011  *RADIOLOGY REPORT*  Clinical Data: 50 year old male with abdominal pain.  ABDOMEN - 2 VIEW  Comparison: MRI abdomen  03/23/2011.  Findings: Nonobstructed bowel gas pattern.  Lung bases are clear. Degenerative changes in the spine.  Bulky osteophytosis at the L5 S1 level felt to explain increased density overlying the left sacral ala. No acute osseous abnormality identified.  No pneumoperitoneum.  IMPRESSION: Nonobstructed bowel gas pattern, no free air.  Original Report Authenticated By: Harley Hallmark, M.D.   Medications:      . aspirin EC  325 mg Oral Daily  . carvedilol  25 mg Oral BID  . furosemide  80 mg Oral BID  . heparin  5,000 Units Subcutaneous Q8H  . hydrALAZINE  10 mg Oral BID  . influenza  inactive virus vaccine  0.5 mL Intramuscular Tomorrow-1000  . isosorbide dinitrate  10 mg Oral TID  . nicotine  21 mg Transdermal Daily  . pneumococcal 23 valent vaccine  0.5 mL Intramuscular Tomorrow-1000    I  have reviewed scheduled and prn medications.  Physical Exam: General: alert, chronically ill Heart: frequent premature beats,  with no rub or gallop. No JVD. Lungs:  Some exp wheezing R lung fields.  No distress. Abdomen: soft,  protuberant, no obvious masses or HSM Extremities: trace edema LE's Dialysis Access: N/A Neuro:  Alert and 0x3,   Problem/Plan: 1. AKI - improving creatinine, baseline creat  around 2. Will sign off, please call as needed. 2. CKD - stage III 3. HTN/volume - volume overload improved, edema better, on po lasix at 80 bid, hydralzine and beta-blocker. Hx of syst CHF.   4.  COPD 5.  Renal complex cyst - f/U with repeat MRI in 6 month recommended.  Joann Kulpa D  03/27/2011,3:54 PM  LOS: 3 days

## 2011-03-28 LAB — BASIC METABOLIC PANEL
BUN: 87 mg/dL — ABNORMAL HIGH (ref 6–23)
CO2: 35 mEq/L — ABNORMAL HIGH (ref 19–32)
Calcium: 7.7 mg/dL — ABNORMAL LOW (ref 8.4–10.5)
Creatinine, Ser: 2.79 mg/dL — ABNORMAL HIGH (ref 0.50–1.35)
Glucose, Bld: 101 mg/dL — ABNORMAL HIGH (ref 70–99)

## 2011-03-28 LAB — DIFFERENTIAL
Basophils Relative: 0 % (ref 0–1)
Eosinophils Absolute: 0.3 10*3/uL (ref 0.0–0.7)
Eosinophils Relative: 2 % (ref 0–5)
Monocytes Absolute: 1.2 10*3/uL — ABNORMAL HIGH (ref 0.1–1.0)
Monocytes Relative: 10 % (ref 3–12)

## 2011-03-28 LAB — GLUCOSE, CAPILLARY: Glucose-Capillary: 79 mg/dL (ref 70–99)

## 2011-03-28 NOTE — Progress Notes (Signed)
Subjective:  feels better. Breathing improved, had a bowel movement. On chronic pain in his back and legs Abdomen x-ray reviewed, negative for acute abdomen process Objective: Vital signs in last 24 hours: Filed Vitals:   03-29-2011 2103 03/28/11 0013 03/28/11 0536 03/28/11 1036  BP: 147/84  121/79 128/82  Pulse: 76 82 86 84  Temp: 97.4 F (36.3 C)  97.6 F (36.4 C) 97.7 F (36.5 C)  TempSrc: Oral  Oral Oral  Resp: 20 19 20 19   Height: 5\' 8"  (1.727 m)     Weight: 74.3 kg (163 lb 12.8 oz)     SpO2: 95% 97% 93% 96%   Weight change: -4.7 kg (-10 lb 5.8 oz)  Intake/Output Summary (Last 24 hours) at 03/28/11 1151 Last data filed at 03/28/11 0500  Gross per 24 hour  Intake    480 ml  Output   1500 ml  Net  -1020 ml   On exam lying comfortably on bed. Not on respiratory distress Heart S1 and S2 no gallops, no murmurs Lung examination without wheezing or rales Abdomen soft nontender bowel sounds present Extremities without edema and his peripheral pulses intact   Lab Results:  [facility] 03/28/11 0620 03-29-11 0820  NA 134* 137  K 3.5 3.8  CL 88* 90*  CO2 35* 35*  GLUCOSE 101* 112*  BUN 87* 94*  CREATININE 2.79* 3.11*  CALCIUM 7.7* 7.5*  MG -- --  PHOS -- 4.0    Basename 03/29/11 0820  AST --  ALT --  ALKPHOS --  BILITOT --  PROT --  ALBUMIN 3.1*    Basename 03/28/11 0620  LIPASE 21  AMYLASE 68    Basename 03/28/11 0620 03/29/2011 1645 03/26/11 0605  WBC -- 15.0* 13.2*  NEUTROABS 8.9* -- --  HGB -- 12.7* 13.5  HCT -- 39.0 41.4  MCV -- 93.8 94.3  PLT -- 231 240   No results found for this basename: CKTOTAL:3,CKMB:3,CKMBINDEX:3,TROPONINI:3 in the last 72 hours No results found for this basename: POCBNP:3 in the last 72 hours No results found for this basename: DDIMER:2 in the last 72 hours  Basename 03/26/11 0605  HGBA1C 5.8*   No results found for this basename: CHOL:2,HDL:2,LDLCALC:2,TRIG:2,CHOLHDL:2,LDLDIRECT:2 in the last 72 hours No results found  for this basename: TSH,T4TOTAL,FREET3,T3FREE,THYROIDAB in the last 72 hours No results found for this basename: VITAMINB12:2,FOLATE:2,FERRITIN:2,TIBC:2,IRON:2,RETICCTPCT:2 in the last 72 hours  Micro Results: Recent Results (from the past 240 hour(s))  MRSA PCR SCREENING     Status: Normal   Collection Time   03/24/11  3:58 PM      Component Value Range Status Comment   MRSA by PCR NEGATIVE  NEGATIVE  Final     Studies/Results: Dg Abd 2 Views  03-29-11  *RADIOLOGY REPORT*  Clinical Data: Abdominal fullness  ABDOMEN - 2 VIEW  Comparison: 03/25/2011  Findings: Negative for bowel obstruction.  No dilated bowel loops. No free intraperitoneal gas, however the extreme top of the diaphragm was not imaged.  Mild amount of stool in the colon, similar to the prior study.  Large left-sided osteophyte at L5-S1 is unchanged.  IMPRESSION: Mild constipation without bowel obstruction.  Original Report Authenticated By: Camelia Phenes, M.D.   Dg Abd 2 Views  03/25/2011  *RADIOLOGY REPORT*  Clinical Data: 50 year old male with abdominal pain.  ABDOMEN - 2 VIEW  Comparison: MRI abdomen 03/23/2011.  Findings: Nonobstructed bowel gas pattern.  Lung bases are clear. Degenerative changes in the spine.  Bulky osteophytosis at the L5 S1 level  felt to explain increased density overlying the left sacral ala. No acute osseous abnormality identified.  No pneumoperitoneum.  IMPRESSION: Nonobstructed bowel gas pattern, no free air.  Original Report Authenticated By: Harley Hallmark, M.D.    Medications: I have reviewed the patient's current medications. Scheduled Meds:   . aspirin EC  325 mg Oral Daily  . carvedilol  25 mg Oral BID  . furosemide  80 mg Oral BID  . heparin  5,000 Units Subcutaneous Q8H  . hydrALAZINE  10 mg Oral BID  . isosorbide dinitrate  10 mg Oral TID  . nicotine  21 mg Transdermal Daily   Continuous Infusions:  PRN Meds:.albuterol, ALPRAZolam, calcium carbonate, HYDROcodone-acetaminophen,  morphine injection, zolpidem  Assessment/Plan: Acute on chronic renal failure creatinine significantly improved continue with the current Lasix 80 mg twice daily. My plan to discharge the patient home tomorrow after evaluation by the case manager at the patient admitted he does not have an M.D.as we need  to followup his kidney function closely, I would try to contact his cardiologist at [facility] in a.m., would wean off oxygenating to evaluate if patient requires any form of home oxygenation. Patient with a history of ongoing tobacco and alcohol abuse I did consult the patient about those behaviors. Patient currently denies any chest pain, telemetry monitor and event for. No change on current medications  LOS: 4 days  Telitha Plath I. 03/28/2011, 11:51 AM

## 2011-03-29 LAB — BASIC METABOLIC PANEL
BUN: 80 mg/dL — ABNORMAL HIGH (ref 6–23)
Calcium: 7.8 mg/dL — ABNORMAL LOW (ref 8.4–10.5)
GFR calc Af Amer: 31 mL/min — ABNORMAL LOW (ref >90)
GFR calc non Af Amer: 26 mL/min — ABNORMAL LOW (ref >90)
Glucose, Bld: 90 mg/dL (ref 70–99)
Sodium: 131 mEq/L — ABNORMAL LOW (ref 135–145)

## 2011-03-29 LAB — GLUCOSE, CAPILLARY: Glucose-Capillary: 123 mg/dL — ABNORMAL HIGH (ref 70–99)

## 2011-03-29 LAB — DIFFERENTIAL
Basophils Absolute: 0 10*3/uL (ref 0.0–0.1)
Basophils Relative: 0 % (ref 0–1)
Eosinophils Relative: 2 % (ref 0–5)
Monocytes Relative: 11 % (ref 3–12)
Neutro Abs: 8.6 10*3/uL — ABNORMAL HIGH (ref 1.7–7.7)

## 2011-03-29 MED ORDER — ALPRAZOLAM 0.5 MG PO TABS: 0.5000 mg | ORAL_TABLET | Freq: Three times a day (TID) | ORAL | Status: DC | PRN

## 2011-03-29 MED ORDER — MOXIFLOXACIN HCL 400 MG PO TABS
400.0000 mg | ORAL_TABLET | Freq: Every day | ORAL | Status: DC
  Filled 2011-03-29: qty 1

## 2011-03-29 MED ORDER — ASPIRIN 325 MG PO TBEC: 325.0000 mg | DELAYED_RELEASE_TABLET | Freq: Every day | ORAL | Status: AC

## 2011-03-29 MED ORDER — ALBUTEROL SULFATE (5 MG/ML) 0.5% IN NEBU: 2.5000 mg | INHALATION_SOLUTION | RESPIRATORY_TRACT | Status: DC | PRN

## 2011-03-29 MED ORDER — ISOSORBIDE DINITRATE 10 MG PO TABS: 10.0000 mg | ORAL_TABLET | Freq: Three times a day (TID) | ORAL | Status: DC

## 2011-03-29 MED ORDER — HYDRALAZINE HCL 10 MG PO TABS: 10.0000 mg | ORAL_TABLET | Freq: Two times a day (BID) | ORAL | Status: DC

## 2011-03-29 MED ORDER — HYDROCODONE-ACETAMINOPHEN 5-500 MG PO CAPS: 1.0000 | ORAL_CAPSULE | Freq: Four times a day (QID) | ORAL | Status: DC | PRN

## 2011-03-29 MED ORDER — CARVEDILOL 25 MG PO TABS: 25.0000 mg | ORAL_TABLET | Freq: Two times a day (BID) | ORAL | Status: DC

## 2011-03-29 MED ORDER — FUROSEMIDE 80 MG PO TABS: 80.0000 mg | ORAL_TABLET | Freq: Two times a day (BID) | ORAL | Status: DC

## 2011-03-29 MED ORDER — MOXIFLOXACIN HCL 400 MG PO TABS: 400.0000 mg | ORAL_TABLET | Freq: Every day | ORAL | Status: DC

## 2011-03-29 MED ORDER — TIOTROPIUM BROMIDE MONOHYDRATE 18 MCG IN CAPS: 18.0000 ug | ORAL_CAPSULE | Freq: Every day | RESPIRATORY_TRACT | Status: DC

## 2011-03-29 MED ORDER — CALCIUM CARBONATE ANTACID 500 MG PO CHEW: 1.0000 | CHEWABLE_TABLET | Freq: Two times a day (BID) | ORAL | Status: DC | PRN

## 2011-03-29 NOTE — Progress Notes (Signed)
Patient discharge home.  Discharge instruction given as explained by MD and nurse.  Copies of all forms given to patient and explained.  Patient and daughter voiced understanding of all instructions as explained by MD and nurse.

## 2011-03-29 NOTE — Discharge Summary (Signed)
DISCHARGE SUMMARY  [name]  MR#: [id number]  DOB:1960/08/09  Date of Admission: 03/24/2011 Date of Discharge: 03/29/2011  Attending Physician:Kenden Brandt I.  Patient's UYQ:[id number] Not In System  Consults:  NEPHROLOGY  Discharge Diagnoses: 1-acute on chronic systolic congestive heart failure 2-acute on chronic renal insufficiency with a baseline creatinine of 2.3 improved 3-ongoing tobacco abuse 4-COPD 5-history of alcohol abuse 6-chronic pain syndrome 7-hyperlipidemia 8-PVD 0-complex left renal cyst suggest close follow up with [place] within 3 month(([id number]) 10-anxiety PROCEDURE: ABDOMIN XRAY Mild constipation without bowel obstruction ECHOLeft ventricle: The cavity size was moderately dilated. Wall thickness was increased in a pattern of mild LVH. The estimated ejection fraction was 30%. Diffuse hypokinesis. - Mitral valve: Mild regurgitation. - Left atrium: The atrium was moderately dilated. - Atrial septum: No defect or patent foramen ovale was identified. MRI abdomin ;lefr renal cyst with complex feature([id number])  Current Discharge Medication List    START taking these medications   Details  albuterol (PROVENTIL) (5 MG/ML) 0.5% nebulizer solution Take 0.5 mLs (2.5 mg total) by nebulization every 2 (two) hours as needed for wheezing or shortness of breath. Qty: 20 mL, Refills: 2    aspirin EC 325 MG EC tablet Take 1 tablet (325 mg total) by mouth daily. Qty: 30 tablet, Refills: 0    calcium carbonate (TUMS - DOSED IN MG ELEMENTAL CALCIUM) 500 MG chewable tablet Chew 1 tablet (200 mg of elemental calcium total) by mouth 2 (two) times daily as needed. Qty: 30 tablet, Refills: 0    hydrALAZINE (APRESOLINE) 10 MG tablet Take 1 tablet (10 mg total) by mouth 2 (two) times daily. Qty: 30 tablet, Refills: 0    tiotropium (SPIRIVA HANDIHALER) 18 MCG inhalation capsule Place 1 capsule (18 mcg total) into inhaler and inhale daily. Qty: 30 capsule, Refills: 12       CONTINUE these medications which have CHANGED   Details  ALPRAZolam (XANAX) 0.5 MG tablet Take 1 tablet (0.5 mg total) by mouth 3 (three) times daily as needed. For anxiety Qty: 20 tablet, Refills: 0    furosemide (LASIX) 80 MG tablet Take 1 tablet (80 mg total) by mouth 2 (two) times daily. Qty: 30 tablet, Refills: 1    isosorbide dinitrate (ISORDIL) 10 MG tablet Take 1 tablet (10 mg total) by mouth 3 (three) times daily. Qty: 30 tablet, Refills: 2      CONTINUE these medications which have NOT CHANGED   Details  carvedilol (COREG) 25 MG tablet Take 25 mg by mouth 2 (two) times daily.      hydrocodone-acetaminophen (LORCET-HD) 5-500 MG per capsule Take 1 capsule by mouth every 6 (six) hours as needed. For pain           Hospital Course: This is a 50 year old gentleman with a past medical history of multiple multiple medical problems including congestive heart failure systolic with EF 56-38%, chronic kidney disease stage III baseline creatinine 1.5-2. Patient transferred from [facility] secondary to shortness of breath and then monocytes acute exacerbation of congestive heart failure and treated with IV Lasix infusion, nephrology consult at that time secondary to worsening creatinine and apparently requesting transfer to: Hospital as the patient may need further dialysis. #1 acute on chronic systolic congestive heart failure patient treated with high dose of Lasix 80 mg twice daily, beta blocker added including coreg,  and hydralazine added. Cardiac enzymes were sent which are negative. 2-D echo confirmed the presence of EF 30%. We'll arrange followup the heart failure clinic if patient is interested,  patient has a history of noncompliance 2-acute on chronic kidney failure with a baseline creatinine of 2 this is improved with a high dose of Lasix and his lower extremity resolved 3-COPD remained stable and patient weaned off oxygenating him as this time patient does not  require any form off oxygenation as outpatient 4-patient counseled regarding alcohol and tobacco abuse  5-hypertension remained under good control r with the current medications, consider Aldactone as outpatient  During hospital stay patient continued to complain of pain and requesting Xanax and pain medication. In none of his abdomen distention, patient has good bowel movement, there is no nausea or vomiting, multiple x-ray of his abdomen were negative, - lipase were negative, patient noted to have an MRI of the abdomen a transverse hospital which were negative.this time I don't feel the patient pain is related to pathological cause rather than abuse to pain medications  Day of Discharge BP 154/107  Pulse 88  Temp(Src) 97.6 F (36.4 C) (Oral)  Resp 20  Ht 5\' 8"  (1.727 m)  Wt 75.1 kg (165 lb 9.1 oz)  BMI 25.17 kg/m2  SpO2 97%  Physical Exam: The patient not on active respiratory distress, Pupil equally "reactive to light and accommodation  heart S1 and S2 no gallop  Lungs no Rales no wheezing  Abdomen distended tender to palpation over bowel sounds present no rebound no guarding, ecchymosis to the suprapubic area  Extremities bilateral lower limb edema resolved CNS patient awake alert oriented x3 moves y f both lower extremities, pulses intact bilateral back examined no point tenderness.   Results for orders placed during the hospital encounter of 03/24/11 (from the past 24 hour(s))  GLUCOSE, CAPILLARY     Status: Abnormal   Collection Time   03/28/11 11:49 AM      Component Value Range   Glucose-Capillary 175 (*) 70 - 99 (mg/dL)  GLUCOSE, CAPILLARY     Status: Abnormal   Collection Time   03/28/11  4:54 PM      Component Value Range   Glucose-Capillary 105 (*) 70 - 99 (mg/dL)  GLUCOSE, CAPILLARY     Status: Normal   Collection Time   03/28/11 10:28 PM      Component Value Range   Glucose-Capillary 79  70 - 99 (mg/dL)  DIFFERENTIAL     Status: Abnormal   Collection Time    03/29/11  5:30 AM      Component Value Range   Neutro Abs 8.6 (*) 1.7 - 7.7 (K/uL)   Lymphs Abs 1.4  0.7 - 4.0 (K/uL)   Monocytes Absolute 1.3 (*) 0.1 - 1.0 (K/uL)   Eosinophils Absolute 0.2  0.0 - 0.7 (K/uL)   Basophils Absolute 0.0  0.0 - 0.1 (K/uL)   Neutrophils Relative 75  43 - 77 (%)   Lymphocytes Relative 12  12 - 46 (%)   Monocytes Relative 11  3 - 12 (%)   Eosinophils Relative 2  0 - 5 (%)   Basophils Relative 0  0 - 1 (%)   WBC Morphology TOXIC GRANULATION    BASIC METABOLIC PANEL     Status: Abnormal   Collection Time   03/29/11  5:30 AM      Component Value Range   Sodium 131 (*) 135 - 145 (mEq/L)   Potassium 4.1  3.5 - 5.1 (mEq/L)   Chloride 89 (*) 96 - 112 (mEq/L)   CO2 29  19 - 32 (mEq/L)   Glucose, Bld 90  70 - 99 (  mg/dL)   BUN 80 (*) 6 - 23 (mg/dL)   Creatinine, Ser 1.19 (*) 0.50 - 1.35 (mg/dL)   Calcium 7.8 (*) 8.4 - 10.5 (mg/dL)   GFR calc non Af Amer 26 (*) >90 (mL/min)   GFR calc Af Amer 31 (*) >90 (mL/min)  GLUCOSE, CAPILLARY     Status: Normal   Collection Time   03/29/11  8:11 AM      Component Value Range   Glucose-Capillary 94  70 - 99 (mg/dL)   Comment 1 Documented in Chart     Comment 2 Notify RN      Disposition: home   Follow-up Appts: Discharge Orders    Future Orders Please Complete By Expires   Diet - low sodium heart healthy      Increase activity slowly         FOLLOW UP : MD NEXT WEEK AND DRAW KIDNEY FUNCTION [facility]   Tests Needing Follow-up:  kidney panel  Signed: Ariv Penrod I. 03/29/2011, 11:32 AM

## 2011-03-29 NOTE — Progress Notes (Signed)
On rounds patient explained that he is concerned that he will not be able to afford meds post discharge if insulin and supplies are added. CM consult entered. Also added sticky note for MD to discuss treatment plan with patient. [name], [name]

## 2011-04-05 ENCOUNTER — Ambulatory Visit (HOSPITAL_COMMUNITY)
Admission: RE | Admit: 2011-04-05 | Discharge: 2011-04-05 | Disposition: A | Discharge status: Home / Self Care | Payer: Medicaid Other | Source: Ambulatory Visit | Attending: Internal Medicine | Admitting: Internal Medicine

## 2011-04-05 ENCOUNTER — Encounter (HOSPITAL_COMMUNITY): Payer: Self-pay | Admitting: *Deleted

## 2011-04-05 ENCOUNTER — Encounter (HOSPITAL_COMMUNITY): Payer: Self-pay

## 2011-04-05 VITALS — BP 156/94 | HR 104 | Ht 68.0 in | Wt 166.5 lb

## 2011-04-05 DIAGNOSIS — I5023 Acute on chronic systolic (congestive) heart failure: Secondary | ICD-10-CM | POA: Insufficient documentation

## 2011-04-05 DIAGNOSIS — J449 Chronic obstructive pulmonary disease, unspecified: Secondary | ICD-10-CM

## 2011-04-05 DIAGNOSIS — I5022 Chronic systolic (congestive) heart failure: Secondary | ICD-10-CM

## 2011-04-05 DIAGNOSIS — J4489 Other specified chronic obstructive pulmonary disease: Secondary | ICD-10-CM | POA: Insufficient documentation

## 2011-04-05 DIAGNOSIS — I10 Essential (primary) hypertension: Secondary | ICD-10-CM

## 2011-04-05 HISTORY — DX: Cyst of kidney, acquired: N28.1

## 2011-04-05 LAB — PROTIME-INR: Prothrombin Time: 14.8 seconds (ref 11.6–15.2)

## 2011-04-05 LAB — CBC
Hemoglobin: 12.1 g/dL — ABNORMAL LOW (ref 13.0–17.0)
MCH: 30.6 pg (ref 26.0–34.0)
RBC: 3.95 MIL/uL — ABNORMAL LOW (ref 4.22–5.81)

## 2011-04-05 LAB — BASIC METABOLIC PANEL
CO2: 20 mEq/L (ref 19–32)
GFR calc non Af Amer: 23 mL/min — ABNORMAL LOW (ref >90)
Glucose, Bld: 84 mg/dL (ref 70–99)
Potassium: 4.3 mEq/L (ref 3.5–5.1)
Sodium: 141 mEq/L (ref 135–145)

## 2011-04-05 MED ORDER — HYDRALAZINE HCL 25 MG PO TABS: 25.0000 mg | ORAL_TABLET | Freq: Three times a day (TID) | ORAL | Status: DC

## 2011-04-05 NOTE — Progress Notes (Signed)
Referring Physician: Dr. Eda Paschal Primary Physician: [facility] in [place]  Primary Cardiologist: Reason for Consultation: Heart Failure    HPI:  [name] is a 50 y.o. gentlemen with history of COPD, systolic HF with EF 30%, CRI with baseline Cr 2.3, history of tobacco and alcohol abuse.  He has not had a catheterization or stress test.  He was recently discharged from [facility] hospital for acute on chronic HF, diuresed almost 30 lbs.  Echo showed EF 30%, mild LVH, diffuse hypokinesis, mild MR and mod dilated LA.  No PFO.  He diuresed almost 30 lbs during hospitalization.    He was referred to the HF clinic for further management.  He has been weighing himself daily and was staying around 161 until 2 days ago and now he is up 2 lbs.  SOB has improved since discharge but still dyspneic.  Has to stop after about 25 yards to rest.  Unable to go up steps, even 3-4 bother him.  Chronic 3 pillow orthopnea for the past 2 years.  +PND. Uses nebulizer at night with some improvement.  Lower extremity edema improved.   Review of Systems:     Cardiac Review of Systems: {Y] = yes [ ]  = no  Chest Pain [    ]  Resting SOB [  y ] Exertional SOB  [ y ] Pollyann Kennedy [name]  ]   Pedal Edema [  y]    Palpitations [  ] Syncope  [  ]   Presyncope [   ]  General Review of Systems: [Y] = yes [  ]=no Constitional: recent weight change [  ]; anorexia [name]  ]; fatigue [  ]; nausea [  ]; night sweats [  ]; fever [  ]; or chills [  ];                                                                                                                                          Dental: poor dentition[  ];   Eye : blurred vision [  ]; diplopia [   ]; vision changes [  ];  Amaurosis fugax[  ]; Resp: cough [  ];  wheezing[  ];  hemoptysis[  ]; shortness of breath[  ]; paroxysmal nocturnal dyspnea[  ]; dyspnea on exertion[  ]; or orthopnea[  ];  GI:  gallstones[  ], vomiting[  ];  dysphagia[  ]; melena[  ];  hematochezia [  ]; heartburn[  ];    Hx of  Colonoscopy[  ]; GU: kidney stones [  ]; hematuria[  ];   dysuria [  ];  nocturia[  ];  history of     obstruction [  ];                 Skin: rash, swelling[  ];, hair loss[  ];  peripheral edema[  ];  or  itching[  ]; Musculosketetal: myalgias[  ];  joint swelling[  ];  joint erythema[  ];  joint pain[  ];  back pain[  ];  Heme/Lymph: bruising[  ];  bleeding[  ];  anemia[  ];  Neuro: TIA[  ];  headaches[  ];  stroke[  ];  vertigo[  ];  seizures[  ];   paresthesias[  ];  difficulty walking[  ];  Psych:depression[  ]; anxiety[  ];  Endocrine: diabetes[  ];  thyroid dysfunction[  ];  Immunizations: Flu [  ]; Pneumococcal[  ];  Other:  Past Medical History  Diagnosis Date  . Hypertension   . Systolic heart failure     EF 16-10%  . Chronic kidney disease (CKD), stage III (moderate)     baseline cr 2.3  . Hyperlipidemia   . COPD (chronic obstructive pulmonary disease)   . Peripheral vascular disease   . ETOH abuse   . Renal cyst     left, complex    Medications Prior to Admission  Medication Sig Dispense Refill  . albuterol (PROVENTIL) (5 MG/ML) 0.5% nebulizer solution Take 0.5 mLs (2.5 mg total) by nebulization every 2 (two) hours as needed for wheezing or shortness of breath.  20 mL  2  . ALPRAZolam (XANAX) 0.5 MG tablet Take 1 tablet (0.5 mg total) by mouth 3 (three) times daily as needed. For anxiety  20 tablet  0  . aspirin EC 325 MG EC tablet Take 1 tablet (325 mg total) by mouth daily.  30 tablet  0  . calcium carbonate (TUMS - DOSED IN MG ELEMENTAL CALCIUM) 500 MG chewable tablet Chew 1 tablet (200 mg of elemental calcium total) by mouth 2 (two) times daily as needed.  30 tablet  0  . carvedilol (COREG) 25 MG tablet Take 1 tablet (25 mg total) by mouth 2 (two) times daily.  30 tablet  3  . furosemide (LASIX) 80 MG tablet Take 1 tablet (80 mg total) by mouth 2 (two) times daily.  30 tablet  1  . hydrALAZINE (APRESOLINE) 10 MG tablet Take 1 tablet (10 mg total) by mouth 2  (two) times daily.  30 tablet  0  . hydrocodone-acetaminophen (LORCET-HD) 5-500 MG per capsule Take 1 capsule by mouth every 6 (six) hours as needed. For pain  30 capsule  0  . isosorbide dinitrate (ISORDIL) 10 MG tablet Take 1 tablet (10 mg total) by mouth 3 (three) times daily.  30 tablet  2  . moxifloxacin (AVELOX) 400 MG tablet Take 1 tablet (400 mg total) by mouth daily at 6 PM.  5 tablet  0  . tiotropium (SPIRIVA HANDIHALER) 18 MCG inhalation capsule Place 1 capsule (18 mcg total) into inhaler and inhale daily.  30 capsule  12   No current facility-administered medications on file as of 04/05/2011.       No Known Allergies  History   Social History  . Marital Status: Widowed    Spouse Name: N/A    Number of Children: N/A  . Years of Education: N/A   Occupational History  . Not on file.   Social History Main Topics  . Smoking status: Former Smoker    Types: Cigarettes    Quit date: 06/10/2010  . Smokeless tobacco: Not on file  . Alcohol Use: No     quit drinking in Feb 2012  . Drug Use: No  . Sexually Active: Not on file   Other Topics Concern  . Not on  file   Social History Narrative  . No narrative on file    Family History  Problem Relation Age of Onset  . Brain cancer Mother   . Pancreatic cancer Father   . Heart failure Paternal Uncle   . Heart disease Paternal Uncle   . Heart failure Paternal Grandfather   . Heart disease Paternal Grandfather     PHYSICAL EXAM: Filed Vitals:   04/05/11 1333  Pulse: 104  Wt 166  No intake or output data in the 24 hours ending 04/05/11 1403  General:  Well appearing. No respiratory difficulty HEENT: normal Neck: supple. JVP difficult to assess but appears 7-8. Carotids 2+ bilat; no bruits. No lymphadenopathy or thryomegaly appreciated. Cor: PMI nondisplaced. Distant regular rate & rhythm. +S3.  2/6 MR murmurs. Lungs: Exp wheezing, rales bilaterally  Abdomen: soft, nontender, nondistended. No  hepatosplenomegaly. No bruits or masses. Good bowel sounds. Extremities: no cyanosis, rash, trace edema, bilateral clubbing.  DP 2+ on right and 1+ on left  Neuro: alert & oriented x 3, cranial nerves grossly intact. moves all 4 extremities w/o difficulty. Affect pleasant.   ASSESSMENT/PLAN:

## 2011-04-05 NOTE — Patient Instructions (Addendum)
Increase Hydralazine 25 mg three times a day.  Continue to weight daily.  Can take an extra lasix 40 mg if weight up 2-3 lbs in 24 hours.    Your physician has requested that you have a cardiac catheterization. Cardiac catheterization is used to diagnose and/or treat various heart conditions. Doctors may recommend this procedure for a number of different reasons. The most common reason is to evaluate chest pain. Chest pain can be a symptom of coronary artery disease (CAD), and cardiac catheterization can show whether plaque is narrowing or blocking your heart's arteries. This procedure is also used to evaluate the valves, as well as measure the blood flow and oxygen levels in different parts of your heart. For further information please visit [email or web address] Please follow instruction sheet, as given.  Consult for pulmonologist for lung testing.   Return for follow up in 2-3 weeks.

## 2011-04-05 NOTE — Assessment & Plan Note (Addendum)
Volume status appears stable today.  NYHA III-IIIb but unsure if this is more due to his heart or lungs.  He appears to have minimal fluid on board but continues to demonstrate marked functional limitations.  He ambulated in the clinic today and O2 saturations remained above 94%.  Will proceed with PFTs as well as right and left heart catheterization to determine limiting factor.  With chronic renal failure will use minimal dye exposure in left heart catheterization, he understands risk of renal failure.  Risk, benefits and alternatives were discussed in full with the patient and he agrees to proceed.   Will increase hydralazine at this time.  Discussed changing coreg to bisoprolol with lung disease but will wait for PFT before making this decision.    Patient seen and examined with Ulyess Blossom PA-C. We discussed all aspects of the encounter. I agree with the assessment and plan as stated above. I agree that cath is next step. Discussed risks and indications at length including risk of renal failure and need for dialysis with contrast exposure. He is willing to proceed. Overall suspect the major issue here is pulmonary and will need pulmonary eval with full PFTs as well.

## 2011-04-05 NOTE — Assessment & Plan Note (Signed)
As above, increase hydralazine.

## 2011-04-06 ENCOUNTER — Encounter (HOSPITAL_BASED_OUTPATIENT_CLINIC_OR_DEPARTMENT_OTHER): Payer: Self-pay

## 2011-04-08 ENCOUNTER — Encounter (HOSPITAL_BASED_OUTPATIENT_CLINIC_OR_DEPARTMENT_OTHER)
Admission: RE | Disposition: A | Discharge status: Hospice | Payer: Self-pay | Source: Ambulatory Visit | Attending: Internal Medicine

## 2011-04-08 ENCOUNTER — Other Ambulatory Visit: Payer: Self-pay | Admitting: Physician Assistant

## 2011-04-08 ENCOUNTER — Inpatient Hospital Stay (HOSPITAL_COMMUNITY)
Admission: AD | Admit: 2011-04-08 | Discharge: 2011-04-13 | DRG: 291 | Disposition: A | Discharge status: Home / Self Care | Payer: Medicaid Other | Source: Ambulatory Visit | Attending: Internal Medicine | Admitting: Internal Medicine

## 2011-04-08 ENCOUNTER — Institutional Professional Consult (permissible substitution): Payer: Self-pay | Admitting: Critical Care Medicine

## 2011-04-08 ENCOUNTER — Inpatient Hospital Stay (HOSPITAL_BASED_OUTPATIENT_CLINIC_OR_DEPARTMENT_OTHER)
Admission: RE | Admit: 2011-04-08 | Discharge: 2011-04-08 | Disposition: A | Discharge status: Hospice | Payer: Medicaid Other | Source: Ambulatory Visit | Attending: Internal Medicine | Admitting: Internal Medicine

## 2011-04-08 ENCOUNTER — Encounter (HOSPITAL_BASED_OUTPATIENT_CLINIC_OR_DEPARTMENT_OTHER): Payer: Self-pay | Admitting: Internal Medicine

## 2011-04-08 DIAGNOSIS — N179 Acute kidney failure, unspecified: Secondary | ICD-10-CM | POA: Diagnosis present

## 2011-04-08 DIAGNOSIS — F1011 Alcohol abuse, in remission: Secondary | ICD-10-CM | POA: Insufficient documentation

## 2011-04-08 DIAGNOSIS — I428 Other cardiomyopathies: Secondary | ICD-10-CM | POA: Insufficient documentation

## 2011-04-08 DIAGNOSIS — R570 Cardiogenic shock: Secondary | ICD-10-CM | POA: Diagnosis present

## 2011-04-08 DIAGNOSIS — N189 Chronic kidney disease, unspecified: Secondary | ICD-10-CM | POA: Insufficient documentation

## 2011-04-08 DIAGNOSIS — E785 Hyperlipidemia, unspecified: Secondary | ICD-10-CM | POA: Diagnosis present

## 2011-04-08 DIAGNOSIS — J449 Chronic obstructive pulmonary disease, unspecified: Secondary | ICD-10-CM | POA: Insufficient documentation

## 2011-04-08 DIAGNOSIS — I5023 Acute on chronic systolic (congestive) heart failure: Principal | ICD-10-CM | POA: Diagnosis present

## 2011-04-08 DIAGNOSIS — I739 Peripheral vascular disease, unspecified: Secondary | ICD-10-CM

## 2011-04-08 DIAGNOSIS — I509 Heart failure, unspecified: Secondary | ICD-10-CM

## 2011-04-08 DIAGNOSIS — F341 Dysthymic disorder: Secondary | ICD-10-CM | POA: Diagnosis present

## 2011-04-08 DIAGNOSIS — Z87891 Personal history of nicotine dependence: Secondary | ICD-10-CM | POA: Insufficient documentation

## 2011-04-08 DIAGNOSIS — R0609 Other forms of dyspnea: Secondary | ICD-10-CM | POA: Insufficient documentation

## 2011-04-08 DIAGNOSIS — I10 Essential (primary) hypertension: Secondary | ICD-10-CM

## 2011-04-08 DIAGNOSIS — I129 Hypertensive chronic kidney disease with stage 1 through stage 4 chronic kidney disease, or unspecified chronic kidney disease: Secondary | ICD-10-CM | POA: Diagnosis present

## 2011-04-08 DIAGNOSIS — Z79899 Other long term (current) drug therapy: Secondary | ICD-10-CM

## 2011-04-08 DIAGNOSIS — N186 End stage renal disease: Secondary | ICD-10-CM | POA: Diagnosis present

## 2011-04-08 DIAGNOSIS — G4733 Obstructive sleep apnea (adult) (pediatric): Secondary | ICD-10-CM | POA: Diagnosis present

## 2011-04-08 DIAGNOSIS — N183 Chronic kidney disease, stage 3 unspecified: Secondary | ICD-10-CM | POA: Diagnosis present

## 2011-04-08 DIAGNOSIS — J4489 Other specified chronic obstructive pulmonary disease: Secondary | ICD-10-CM | POA: Insufficient documentation

## 2011-04-08 DIAGNOSIS — R0989 Other specified symptoms and signs involving the circulatory and respiratory systems: Secondary | ICD-10-CM | POA: Insufficient documentation

## 2011-04-08 DIAGNOSIS — F1010 Alcohol abuse, uncomplicated: Secondary | ICD-10-CM

## 2011-04-08 LAB — POCT I-STAT 3, VENOUS BLOOD GAS (G3P V)
Acid-base deficit: 6 mmol/L — ABNORMAL HIGH (ref 0.0–2.0)
Acid-base deficit: 7 mmol/L — ABNORMAL HIGH (ref 0.0–2.0)
Bicarbonate: 17.4 mEq/L — ABNORMAL LOW (ref 20.0–24.0)
Bicarbonate: 19 mEq/L — ABNORMAL LOW (ref 20.0–24.0)
O2 Saturation: 48 %
O2 Saturation: 49 %
O2 Saturation: 54 %
TCO2: 19 mmol/L (ref 0–100)
TCO2: 20 mmol/L (ref 0–100)
pCO2, Ven: 37.5 mmHg — ABNORMAL LOW (ref 45.0–50.0)
pO2, Ven: 29 mmHg — CL (ref 30.0–45.0)

## 2011-04-08 LAB — GLUCOSE, CAPILLARY

## 2011-04-08 LAB — POCT I-STAT 3, ART BLOOD GAS (G3+)
O2 Saturation: 98 %
pCO2 arterial: 29.9 mmHg — ABNORMAL LOW (ref 35.0–45.0)
pO2, Arterial: 109 mmHg — ABNORMAL HIGH (ref 80.0–100.0)

## 2011-04-08 LAB — CBC
HCT: 32.2 % — ABNORMAL LOW (ref 39.0–52.0)
MCH: 30.3 pg (ref 26.0–34.0)
MCHC: 33.2 g/dL (ref 30.0–36.0)
MCV: 91.2 fL (ref 78.0–100.0)
RDW: 14.5 % (ref 11.5–15.5)

## 2011-04-08 SURGERY — JV LEFT AND RIGHT HEART CATHETERIZATION WITH CORONARY ANGIOGRAM
Anesthesia: Choice

## 2011-04-08 SURGERY — JV LEFT AND RIGHT HEART CATHETERIZATION WITH CORONARY ANGIOGRAM
Anesthesia: Moderate Sedation

## 2011-04-08 MED ORDER — HEPARIN SODIUM (PORCINE) 5000 UNIT/ML IJ SOLN
5000.0000 [IU] | Freq: Three times a day (TID) | INTRAMUSCULAR | Status: DC
  Administered 2011-04-08 – 2011-04-13 (×15): 5000 [IU] via SUBCUTANEOUS
  Filled 2011-04-08 (×19): qty 1

## 2011-04-08 MED ORDER — SODIUM CHLORIDE 0.9 % IJ SOLN
3.0000 mL | INTRAMUSCULAR | Status: DC | PRN
  Administered 2011-04-10: 3 mL via INTRAVENOUS

## 2011-04-08 MED ORDER — POTASSIUM CHLORIDE CRYS ER 20 MEQ PO TBCR
20.0000 meq | EXTENDED_RELEASE_TABLET | Freq: Every day | ORAL | Status: DC
  Administered 2011-04-09 – 2011-04-13 (×5): 20 meq via ORAL
  Filled 2011-04-08 (×5): qty 1

## 2011-04-08 MED ORDER — SODIUM CHLORIDE 0.9 % IJ SOLN
3.0000 mL | Freq: Two times a day (BID) | INTRAMUSCULAR | Status: DC
  Administered 2011-04-08 – 2011-04-09 (×2): 3 mL via INTRAVENOUS

## 2011-04-08 MED ORDER — SODIUM CHLORIDE 0.9 % IV SOLN
250.0000 mL | INTRAVENOUS | Status: DC | PRN
  Administered 2011-04-08: 10 mL via INTRAVENOUS

## 2011-04-08 MED ORDER — OXYCODONE HCL 5 MG PO TABS
5.0000 mg | ORAL_TABLET | ORAL | Status: DC | PRN
  Administered 2011-04-08 – 2011-04-13 (×15): 5 mg via ORAL
  Filled 2011-04-08 (×15): qty 1

## 2011-04-08 MED ORDER — ALPRAZOLAM 0.25 MG PO TABS
0.2500 mg | ORAL_TABLET | Freq: Three times a day (TID) | ORAL | Status: DC | PRN
  Administered 2011-04-08 – 2011-04-13 (×12): 0.25 mg via ORAL
  Filled 2011-04-08 (×10): qty 1

## 2011-04-08 MED ORDER — ALBUTEROL SULFATE (5 MG/ML) 0.5% IN NEBU
2.5000 mg | INHALATION_SOLUTION | Freq: Four times a day (QID) | RESPIRATORY_TRACT | Status: DC | PRN
  Administered 2011-04-10: 2.5 mg via RESPIRATORY_TRACT
  Filled 2011-04-08 (×2): qty 0.5

## 2011-04-08 MED ORDER — SODIUM CHLORIDE 0.9 % IV SOLN: 250.0000 mL | INTRAVENOUS | Status: DC

## 2011-04-08 MED ORDER — ASPIRIN 325 MG PO TABS
325.0000 mg | ORAL_TABLET | Freq: Every day | ORAL | Status: DC
  Administered 2011-04-08 – 2011-04-13 (×6): 325 mg via ORAL
  Filled 2011-04-08 (×6): qty 1

## 2011-04-08 MED ORDER — SODIUM CHLORIDE 0.9 % IJ SOLN
3.0000 mL | Freq: Two times a day (BID) | INTRAMUSCULAR | Status: DC
  Administered 2011-04-08 – 2011-04-13 (×10): 3 mL via INTRAVENOUS

## 2011-04-08 MED ORDER — CARVEDILOL 12.5 MG PO TABS
12.5000 mg | ORAL_TABLET | Freq: Two times a day (BID) | ORAL | Status: DC
  Administered 2011-04-08 – 2011-04-13 (×10): 12.5 mg via ORAL
  Filled 2011-04-08 (×12): qty 1

## 2011-04-08 MED ORDER — SODIUM CHLORIDE 0.9 % IJ SOLN: 3.0000 mL | INTRAMUSCULAR | Status: DC | PRN

## 2011-04-08 MED ORDER — HYDRALAZINE HCL 25 MG PO TABS
25.0000 mg | ORAL_TABLET | Freq: Three times a day (TID) | ORAL | Status: DC
  Administered 2011-04-08 – 2011-04-09 (×3): 25 mg via ORAL
  Filled 2011-04-08 (×7): qty 1

## 2011-04-08 MED ORDER — ALPRAZOLAM 0.25 MG PO TABS
0.2500 mg | ORAL_TABLET | Freq: Two times a day (BID) | ORAL | Status: DC | PRN
  Administered 2011-04-09: 0.25 mg via ORAL
  Filled 2011-04-08 (×3): qty 1

## 2011-04-08 MED ORDER — FUROSEMIDE 10 MG/ML IJ SOLN
80.0000 mg | Freq: Two times a day (BID) | INTRAMUSCULAR | Status: DC
  Administered 2011-04-08: 80 mg via INTRAVENOUS

## 2011-04-08 MED ORDER — TIOTROPIUM BROMIDE MONOHYDRATE 18 MCG IN CAPS
18.0000 ug | ORAL_CAPSULE | Freq: Every day | RESPIRATORY_TRACT | Status: DC
  Administered 2011-04-09 – 2011-04-13 (×6): 18 ug via RESPIRATORY_TRACT
  Filled 2011-04-08: qty 5

## 2011-04-08 MED ORDER — MILRINONE IN DEXTROSE 200-5 MCG/ML-% IV SOLN
0.2500 ug/kg/min | INTRAVENOUS | Status: AC
  Administered 2011-04-08 – 2011-04-10 (×4): 0.25 ug/kg/min via INTRAVENOUS
  Filled 2011-04-08 (×4): qty 100

## 2011-04-08 MED ORDER — ASPIRIN 325 MG PO TABS
325.0000 mg | ORAL_TABLET | Freq: Once | ORAL | Status: AC
  Administered 2011-04-08: 325 mg via ORAL

## 2011-04-08 MED ORDER — ISOSORBIDE MONONITRATE ER 30 MG PO TB24
30.0000 mg | ORAL_TABLET | Freq: Every day | ORAL | Status: DC
  Administered 2011-04-08 – 2011-04-13 (×6): 30 mg via ORAL
  Filled 2011-04-08 (×7): qty 1

## 2011-04-08 MED ORDER — ACETAMINOPHEN 325 MG PO TABS
650.0000 mg | ORAL_TABLET | ORAL | Status: DC | PRN
  Administered 2011-04-08 – 2011-04-10 (×3): 650 mg via ORAL
  Filled 2011-04-08 (×3): qty 2

## 2011-04-08 MED ORDER — ONDANSETRON HCL 4 MG/2ML IJ SOLN: 4.0000 mg | Freq: Four times a day (QID) | INTRAMUSCULAR | Status: DC | PRN

## 2011-04-08 NOTE — OR Nursing (Signed)
Pt transported via stretcher, on monitor, to [id number] without change.

## 2011-04-08 NOTE — OR Nursing (Signed)
Tegaderm dressing applied, site intact, bedrest begins at 0945

## 2011-04-08 NOTE — H&P (View-Only) (Signed)
Referring Physician: Dr. Elsaid Primary Physician: [facility] in [place]  Primary Cardiologist: Reason for Consultation: Heart Failure    HPI:  [name] is a 50 y.o. gentlemen with history of COPD, systolic HF with EF 30%, CRI with baseline Cr 2.3, history of tobacco and alcohol abuse.  He has not had a catheterization or stress test.  He was recently discharged from [facility] for acute on chronic HF, diuresed almost 30 lbs.  Echo showed EF 30%, mild LVH, diffuse hypokinesis, mild MR and mod dilated LA.  No PFO.  He diuresed almost 30 lbs during hospitalization.    He was referred to the HF clinic for further management.  He has been weighing himself daily and was staying around 161 until 2 days ago and now he is up 2 lbs.  SOB has improved since discharge but still dyspneic.  Has to stop after about 25 yards to rest.  Unable to go up steps, even 3-4 bother him.  Chronic 3 pillow orthopnea for the past 2 years.  +PND. Uses nebulizer at night with some improvement.  Lower extremity edema improved.   Review of Systems:     Cardiac Review of Systems: {Y] = yes [ ] = no  Chest Pain [    ]  Resting SOB [  y ] Exertional SOB  [ y ] Orthopnea [y  ]   Pedal Edema [  y]    Palpitations [  ] Syncope  [  ]   Presyncope [   ]  General Review of Systems: [Y] = yes [  ]=no Constitional: recent weight change [  ]; anorexia [y  ]; fatigue [  ]; nausea [  ]; night sweats [  ]; fever [  ]; or chills [  ];                                                                                                                                          Dental: poor dentition[  ];   Eye : blurred vision [  ]; diplopia [   ]; vision changes [  ];  Amaurosis fugax[  ]; Resp: cough [  ];  wheezing[  ];  hemoptysis[  ]; shortness of breath[  ]; paroxysmal nocturnal dyspnea[  ]; dyspnea on exertion[  ]; or orthopnea[  ];  GI:  gallstones[  ], vomiting[  ];  dysphagia[  ]; melena[  ];  hematochezia [  ]; heartburn[  ];    Hx of  Colonoscopy[  ]; GU: kidney stones [  ]; hematuria[  ];   dysuria [  ];  nocturia[  ];  history of     obstruction [  ];                 Skin: rash, swelling[  ];, hair loss[  ];  peripheral edema[  ];  or   itching[  ]; Musculosketetal: myalgias[  ];  joint swelling[  ];  joint erythema[  ];  joint pain[  ];  back pain[  ];  Heme/Lymph: bruising[  ];  bleeding[  ];  anemia[  ];  Neuro: TIA[  ];  headaches[  ];  stroke[  ];  vertigo[  ];  seizures[  ];   paresthesias[  ];  difficulty walking[  ];  Psych:depression[  ]; anxiety[  ];  Endocrine: diabetes[  ];  thyroid dysfunction[  ];  Immunizations: Flu [  ]; Pneumococcal[  ];  Other:  Past Medical History  Diagnosis Date  . Hypertension   . Systolic heart failure     EF 25-30%  . Chronic kidney disease (CKD), stage III (moderate)     baseline cr 2.3  . Hyperlipidemia   . COPD (chronic obstructive pulmonary disease)   . Peripheral vascular disease   . ETOH abuse   . Renal cyst     left, complex    Medications Prior to Admission  Medication Sig Dispense Refill  . albuterol (PROVENTIL) (5 MG/ML) 0.5% nebulizer solution Take 0.5 mLs (2.5 mg total) by nebulization every 2 (two) hours as needed for wheezing or shortness of breath.  20 mL  2  . ALPRAZolam (XANAX) 0.5 MG tablet Take 1 tablet (0.5 mg total) by mouth 3 (three) times daily as needed. For anxiety  20 tablet  0  . aspirin EC 325 MG EC tablet Take 1 tablet (325 mg total) by mouth daily.  30 tablet  0  . calcium carbonate (TUMS - DOSED IN MG ELEMENTAL CALCIUM) 500 MG chewable tablet Chew 1 tablet (200 mg of elemental calcium total) by mouth 2 (two) times daily as needed.  30 tablet  0  . carvedilol (COREG) 25 MG tablet Take 1 tablet (25 mg total) by mouth 2 (two) times daily.  30 tablet  3  . furosemide (LASIX) 80 MG tablet Take 1 tablet (80 mg total) by mouth 2 (two) times daily.  30 tablet  1  . hydrALAZINE (APRESOLINE) 10 MG tablet Take 1 tablet (10 mg total) by mouth 2  (two) times daily.  30 tablet  0  . hydrocodone-acetaminophen (LORCET-HD) 5-500 MG per capsule Take 1 capsule by mouth every 6 (six) hours as needed. For pain  30 capsule  0  . isosorbide dinitrate (ISORDIL) 10 MG tablet Take 1 tablet (10 mg total) by mouth 3 (three) times daily.  30 tablet  2  . moxifloxacin (AVELOX) 400 MG tablet Take 1 tablet (400 mg total) by mouth daily at 6 PM.  5 tablet  0  . tiotropium (SPIRIVA HANDIHALER) 18 MCG inhalation capsule Place 1 capsule (18 mcg total) into inhaler and inhale daily.  30 capsule  12   No current facility-administered medications on file as of 04/05/2011.       No Known Allergies  History   Social History  . Marital Status: Widowed    Spouse Name: N/A    Number of Children: N/A  . Years of Education: N/A   Occupational History  . Not on file.   Social History Main Topics  . Smoking status: Former Smoker    Types: Cigarettes    Quit date: 06/10/2010  . Smokeless tobacco: Not on file  . Alcohol Use: No     quit drinking in Feb 2012  . Drug Use: No  . Sexually Active: Not on file   Other Topics Concern  . Not on   file   Social History Narrative  . No narrative on file    Family History  Problem Relation Age of Onset  . Brain cancer Mother   . Pancreatic cancer Father   . Heart failure Paternal Uncle   . Heart disease Paternal Uncle   . Heart failure Paternal Grandfather   . Heart disease Paternal Grandfather     PHYSICAL EXAM: Filed Vitals:   04/05/11 1333  Pulse: 104  Wt 166  No intake or output data in the 24 hours ending 04/05/11 1403  General:  Well appearing. No respiratory difficulty HEENT: normal Neck: supple. JVP difficult to assess but appears 7-8. Carotids 2+ bilat; no bruits. No lymphadenopathy or thryomegaly appreciated. Cor: PMI nondisplaced. Distant regular rate & rhythm. +S3.  2/6 MR murmurs. Lungs: Exp wheezing, rales bilaterally  Abdomen: soft, nontender, nondistended. No  hepatosplenomegaly. No bruits or masses. Good bowel sounds. Extremities: no cyanosis, rash, trace edema, bilateral clubbing.  DP 2+ on right and 1+ on left  Neuro: alert & oriented x 3, cranial nerves grossly intact. moves all 4 extremities w/o difficulty. Affect pleasant.   ASSESSMENT/PLAN:    

## 2011-04-08 NOTE — Op Note (Signed)
Cardiac Cath Procedure Note  Indication:   Procedures performed:  1) Right heart cathererization 2) Selective coronary angiography 3) Left heart catheterization  No v-gram done due to desire to limit contrast exposure.  Description of procedure:     The risks and indication of the procedure were explained. Consent was signed and placed on the chart. An appropriate timeout was taken prior to the procedure. The right groin was prepped and draped in the routine sterile fashion and anesthetized with 1% local lidocaine.   A 4 FR arterial sheath was placed in the right femoral artery using a modified Seldinger technique. Standard catheters including a JL4, 3DRC and angled pigtail were used. All catheter exchanges were made over a wire. A 7 FR venous sheath was placed in the right femoral vein using a modified Seldinger technique. A standard Swan-Ganz catheter was used for the procedure.   Complications:  None apparent  Findings:  RA = 18 RV =  67/16/21 PA =   72/43 (57) PCW =  38 v = 50 Fick cardiac output/index = 2.9/1.5 PVR = 6.6 Woods  SVR = 2883 Dynes FA sat = 98 PA sat = 48%, 49% SVC sat = 54%  Ao Pressure: 147/102 (122) LV Pressure: 147/23/39  There was no signficant gradient across the aortic valve on pullback.  Left main: Normal   LAD: Normal  LCX: Normal   RCA: Dominant/con-dominant. Normal   Assessment:  1) Normal coronary arteries 2) Severe NICM with markedly elevated filling pressures and low cardiac output c/w cardiogenic shock 3) Markedly elevated systemic vascular resistance 4) PVR probably overestimated due to prominent v-waves (mitral regurgitation)  Plan/Discussion:  Admit to stepdown for diuresis and inotropic support. Will need aggressive afterload reduction.   Arvilla Meres, MD 9:32 AM

## 2011-04-08 NOTE — OR Nursing (Signed)
Meal served 

## 2011-04-08 NOTE — Interval H&P Note (Signed)
History and Physical Interval Note:  04/08/2011 8:58 AM  [name]  has presented today for surgery, with the diagnosis of Heart failure, dyspnea  The various methods of treatment have been discussed with the patient and family. After consideration of risks, benefits and other options for treatment, the patient has consented to  Procedure(s): JV LEFT AND RIGHT HEART CATHETERIZATION WITH CORONARY ANGIOGRAM as a surgical intervention .  The patients' history has been reviewed, patient examined, no change in status, stable for surgery.  I have reviewed the patients' chart and labs.  Questions were answered to the patient's satisfaction.     [name]

## 2011-04-08 NOTE — Progress Notes (Signed)
Patient seen and examined with Nicki Bradley PA-C. We discussed all aspects of the encounter. I agree with the assessment and plan as stated above.   

## 2011-04-08 NOTE — OR Nursing (Signed)
Bedrest complete, pt up to bedside to void, site intact.

## 2011-04-08 NOTE — OR Nursing (Signed)
Report called to Jan, RN on 2900.

## 2011-04-09 ENCOUNTER — Inpatient Hospital Stay (HOSPITAL_COMMUNITY): Payer: Medicaid Other

## 2011-04-09 DIAGNOSIS — R570 Cardiogenic shock: Secondary | ICD-10-CM

## 2011-04-09 LAB — BASIC METABOLIC PANEL
BUN: 54 mg/dL — ABNORMAL HIGH (ref 6–23)
CO2: 20 mEq/L (ref 19–32)
Chloride: 105 mEq/L (ref 96–112)
Creatinine, Ser: 2.88 mg/dL — ABNORMAL HIGH (ref 0.50–1.35)

## 2011-04-09 LAB — GLUCOSE, CAPILLARY
Glucose-Capillary: 92 mg/dL (ref 70–99)
Glucose-Capillary: 124 mg/dL — ABNORMAL HIGH (ref 70–99)

## 2011-04-09 MED ORDER — FUROSEMIDE 10 MG/ML IJ SOLN
80.0000 mg | Freq: Two times a day (BID) | INTRAMUSCULAR | Status: DC
  Administered 2011-04-09 – 2011-04-12 (×7): 80 mg via INTRAVENOUS
  Filled 2011-04-09 (×9): qty 8

## 2011-04-09 MED ORDER — HYDRALAZINE HCL 50 MG PO TABS
50.0000 mg | ORAL_TABLET | Freq: Three times a day (TID) | ORAL | Status: DC
  Administered 2011-04-09 – 2011-04-12 (×9): 50 mg via ORAL
  Filled 2011-04-09 (×13): qty 1

## 2011-04-09 MED ORDER — ALBUTEROL SULFATE (5 MG/ML) 0.5% IN NEBU
2.5000 mg | INHALATION_SOLUTION | Freq: Three times a day (TID) | RESPIRATORY_TRACT | Status: DC
  Administered 2011-04-09 – 2011-04-10 (×3): 2.5 mg via RESPIRATORY_TRACT
  Filled 2011-04-09 (×3): qty 0.5

## 2011-04-09 MED ORDER — HYDRALAZINE HCL 25 MG PO TABS
25.0000 mg | ORAL_TABLET | ORAL | Status: AC
  Administered 2011-04-09: 25 mg via ORAL
  Filled 2011-04-09: qty 1

## 2011-04-09 MED ORDER — ALBUTEROL SULFATE (5 MG/ML) 0.5% IN NEBU
2.5000 mg | INHALATION_SOLUTION | Freq: Once | RESPIRATORY_TRACT | Status: AC
  Administered 2011-04-09: 2.5 mg via RESPIRATORY_TRACT

## 2011-04-09 MED ORDER — SODIUM CHLORIDE 0.9 % IV SOLN
INTRAVENOUS | Status: DC
  Administered 2011-04-09: 21:00:00 via INTRAVENOUS

## 2011-04-09 NOTE — Progress Notes (Signed)
Advanced Heart Failure Rounding Note   Subjective:    Mr. Lundin was admitted 11/29 with cardiogenic shock after right and left heart catheterization.  RA 18, RV mean 21, PA mean 57, PCW 38 with V 50, Fick Cardiac output/index 2.9/1.5.  PVR 6.6 Woods (although felt overestimated due to MR).  Normal coronaries.    He was placed on milrinone.  Given 1 dose of IV lasix yesterday with adequate response although weight unchanged today. BP up today.  He feels his breathing has improved since yesterday, not as SOB during conversations.  No CP.  No dizziness.      Objective:    Vital Signs:   Temp:  [97.5 F (36.4 C)-98.1 F (36.7 C)] 97.7 F (36.5 C) (11/30 0400) Pulse Rate:  [81-96] 89  (11/30 0400) Resp:  [12-53] 23  (11/30 0400) BP: (101-175)/(52-107) 175/97 mmHg (11/30 0638) SpO2:  [91 %-98 %] 97 % (11/30 0400) Weight:  [73 kg (160 lb 15 oz)-74.1 kg (163 lb 5.8 oz)] 163 lb 5.8 oz (74.1 kg) (11/30 0500)    24-hour weight change: Weight change:   Intake/Output:  11/29 0701 - 11/30 0700 In: 1162 [P.O.:960; I.V.:202] Out: 1950 [Urine:1950]   Physical Exam: General:  Well appearing. No resp difficulty HEENT: normal Neck: thick supple. JVP 7-8 difficult to assess . Carotids 2+ bilat; no bruits. No lymphadenopathy or thryomegaly appreciated. Cor: PMI nondisplaced. Distant regular rate & rhythm.  +S3.  2/6 murmurs. Lungs: occ rales Abdomen: obese. soft, nontender, nondistended. No hepatosplenomegaly. No bruits or masses. Good bowel sounds. Extremities: no cyanosis, clubbing, rash, edema Neuro: alert & orientedx3, cranial nerves grossly intact. moves all 4 extremities w/o difficulty. Affect pleasant  Telemetry: SR 80s  Labs: Basic Metabolic Panel:  Lab 04/09/11 0454 04/08/11 1614 04/05/11 1545  NA 139 -- 141  K 3.7 -- 4.3  CL 105 -- 106  CO2 20 -- 20  GLUCOSE 103* -- 84  BUN 54* -- 54*  CREATININE 2.88* 2.91* 2.99*  CALCIUM 8.5 -- 8.6  MG 2.3 -- --  PHOS -- -- --     Liver Function Tests: No results found for this basename: AST:5,ALT:5,ALKPHOS:5,BILITOT:5,PROT:5,ALBUMIN:5 in the last 168 hours No results found for this basename: LIPASE:5,AMYLASE:5 in the last 168 hours No results found for this basename: AMMONIA:3 in the last 168 hours  CBC:  Lab 04/08/11 1614 04/05/11 1545  WBC 8.8 11.2*  NEUTROABS -- --  HGB 10.7* 12.1*  HCT 32.2* 36.4*  MCV 91.2 92.2  PLT 221 251    Cardiac Enzymes: No results found for this basename: CKTOTAL:5,CKMB:5,CKMBINDEX:5,TROPONINI:5 in the last 168 hours  BNP: No results found for this basename: POCBNP:5 in the last 168 hours  CBG:  Lab 04/08/11 2138  GLUCAP 114*    Coagulation Studies: No results found for this basename: LABPROT:5,INR:5 in the last 72 hours  Other results:   Imaging: No results found.   Medications:     Scheduled Medications:    . aspirin  325 mg Oral Daily  . carvedilol  12.5 mg Oral Q12H  . heparin  5,000 Units Subcutaneous Q8H  . hydrALAZINE  25 mg Oral Q8H  . isosorbide mononitrate  30 mg Oral Daily  . potassium chloride  20 mEq Oral Daily  . sodium chloride  3 mL Intravenous Q12H  . sodium chloride  3 mL Intravenous Q12H  . tiotropium  18 mcg Inhalation Daily    Infusions:    . milrinone 0.251 mcg/kg/min (04/09/11 0600)  PRN Medications: sodium chloride, acetaminophen, albuterol, ALPRAZolam, ALPRAZolam, ondansetron (ZOFRAN) IV, oxyCODONE, sodium chloride, sodium chloride   Assessment:    Cardiogenic Shock  Chronic Systolic Heart Failure  Nonischemic cardiomyopathy, EF 30%  Normal coronaries per cath 04/08/2011  Acute on Chronic Renal Failure, baseline Cr 2.3  No ACEI/ARB  COPD  Plan/Discussion:    Symptomatically improved but still with volume on board.  Will continue with diuresis and inotrope support. Also needs aggressive afterload reduction (SVR > 2880)  At this time the patient has only received 1 dose of IV lasix, UOP did slow last  evening but his second dose of lasix was not given.  I will therefore continue with BID lasix today but will follow up at lunch to ensure adequate response.  Will continue to monitor renal function closely, stable at this point.  Will hold off on digoxin at this time with renal failure.  Will increase afterload reduction with hydralazine/nitrates which will also help with better BP control.    Length of Stay: 1 Ulyess Blossom, [place]  04/09/2011, 7:47 AM  Patient seen and examined with Ulyess Blossom PA-C. We discussed all aspects of the encounter. I agree with the assessment and plan as stated above. Improved with inotropes and first dose lasix. Will continue IV diuresis and milrinone over weekend. Keep in stepdown. Aggressive titration of hydralazine over the weekend with goal of at least 75 TID and Imdur 30 BID.  I still think he has very severe lung disease in addition to his cardiomyopathy - management will be challenging. Get PFTs/PFDs to assess severity of lung disease. No ACE-I due to renal failure. B-blocker at half dose. We have considered switching carvedilol to bisoprolol for more beta-selectivity. Has severe leg pain concerning for PAD but pulses seem ok. Check ABIs.   Daniel BensimhonMD 9:26 AM

## 2011-04-09 NOTE — H&P (Addendum)
Patient being admitted after outpatient cath which showed decompensated HF.   Please see our clinic note on 04/05/2011 for full details.   Daniel BensimhonMD  1:47 AM  Cath results:  Findings:  RA = 18  RV = 67/16/21  PA = 72/43 (57)  PCW = 38 v = 50  Fick cardiac output/index = 2.9/1.5  PVR = 6.6 Woods  SVR = 2883 Dynes  FA sat = 98  PA sat = 48%, 49%  SVC sat = 54%  Ao Pressure: 147/102 (122)  LV Pressure: 147/23/39  There was no signficant gradient across the aortic valve on pullback.  Left main: Normal  LAD: Normal  LCX: Normal  RCA: Dominant/con-dominant. Normal  Assessment:  1) Normal coronary arteries  2) Severe NICM with markedly elevated filling pressures and low cardiac output c/w cardiogenic shock  3) Markedly elevated systemic vascular resistance  4) PVR probably overestimated due to prominent v-waves (mitral regurgitation)  Plan/Discussion:  Admit to stepdown for diuresis and inotropic support. Will need aggressive afterload reduction.

## 2011-04-09 NOTE — Plan of Care (Signed)
Problem: Consults Goal: Tobacco Cessation referral if indicated Outcome: Not Applicable Date Met:  04/09/11 Pt. Tobacco free as of 06/2010

## 2011-04-09 NOTE — Progress Notes (Signed)
Pt refuses to wear CPAP at this time. No distress noted. 

## 2011-04-10 DIAGNOSIS — M79609 Pain in unspecified limb: Secondary | ICD-10-CM

## 2011-04-10 DIAGNOSIS — I5023 Acute on chronic systolic (congestive) heart failure: Secondary | ICD-10-CM

## 2011-04-10 LAB — BASIC METABOLIC PANEL
CO2: 22 mEq/L (ref 19–32)
Calcium: 8.4 mg/dL (ref 8.4–10.5)
Chloride: 102 mEq/L (ref 96–112)
Creatinine, Ser: 2.71 mg/dL — ABNORMAL HIGH (ref 0.50–1.35)
Glucose, Bld: 101 mg/dL — ABNORMAL HIGH (ref 70–99)

## 2011-04-10 LAB — MRSA PCR SCREENING: MRSA by PCR: NEGATIVE

## 2011-04-10 MED ORDER — ALBUTEROL SULFATE (5 MG/ML) 0.5% IN NEBU
2.5000 mg | INHALATION_SOLUTION | Freq: Three times a day (TID) | RESPIRATORY_TRACT | Status: DC
  Administered 2011-04-11 – 2011-04-12 (×6): 2.5 mg via RESPIRATORY_TRACT
  Filled 2011-04-10 (×5): qty 0.5

## 2011-04-10 NOTE — Progress Notes (Signed)
Patient ID: [name], male   DOB: October 15, 1960, 50 y.o.   MRN: [id number] Subjective:  Dyspnea improved. C/o body aches. Objective:  Vital Signs in the last 24 hours: Temp:  [97.5 F (36.4 C)-98.3 F (36.8 C)] 98.3 F (36.8 C) (12/01 0510) Pulse Rate:  [85-94] 89  (12/01 0510) Resp:  [18-25] 18  (12/01 0510) BP: (126-166)/(62-102) 158/85 mmHg (12/01 0825) SpO2:  [94 %-99 %] 95 % (12/01 1011) Weight:  [76.386 kg (168 lb 6.4 oz)] 168 lb 6.4 oz (76.386 kg) (12/01 0019)  Intake/Output from previous day: 11/30 0701 - 12/01 0700 In: 1893.2 [P.O.:1480; I.V.:405.2; IV Piggyback:8] Out: 4340 [Urine:4340] Intake/Output from this shift: Total I/O In: 240 [P.O.:240] Out: -   Physical Exam: Well appearing middle aged man, NAD HEENT: Unremarkable Neck:  No JVD, no thyromegally Lymphatics:  No adenopathy Back:  No CVA tenderness Lungs:  Clear except for rales 1/4 up bilaterally. HEART:  Regular rate rhythm, no murmurs, no rubs, no clicks. S3 gallop. Abd:  Flat, positive bowel sounds, no organomegally, no rebound, no guarding Ext:  2 plus pulses, trace edema, no cyanosis, no clubbing. No hematoma. Skin:  No rashes no nodules Neuro:  CN II through XII intact, motor grossly intact  Lab Results:  Basename 04/08/11 1614  WBC 8.8  HGB 10.7*  PLT 221    Basename 04/10/11 0500 04/09/11 0554  NA 136 139  K 3.7 3.7  CL 102 105  CO2 22 20  GLUCOSE 101* 103*  BUN 48* 54*  CREATININE 2.71* 2.88*   No results found for this basename: TROPONINI:2,CK,MB:2 in the last 72 hours Hepatic Function Panel No results found for this basename: PROT,ALBUMIN,AST,ALT,ALKPHOS,BILITOT,BILIDIR,IBILI in the last 72 hours No results found for this basename: CHOL in the last 72 hours No results found for this basename: PROTIME in the last 72 hours  Cardiac Studies: Right and left heart cath reviewed. Tele - NSR. Assessment/Plan:  1. Acute on chronic systolic heart failure - Continue diuretic therapy.  Continue afterload reduction and ionotropes. Follow daily weights. 2. COPD - continue bronchodilators  3. Stage 3 renal insufficiency - creatinine trending downward. Will follow.   LOS: 2 days    Lewayne Bunting 04/10/2011, 10:38 AM

## 2011-04-10 NOTE — Progress Notes (Signed)
ABIs completed. Right 1.34. Left ABI 1.18.  [name] 04/10/2011, 11:03 AM

## 2011-04-11 LAB — BASIC METABOLIC PANEL
CO2: 24 mEq/L (ref 19–32)
Calcium: 8.7 mg/dL (ref 8.4–10.5)
Calcium: 8.9 mg/dL (ref 8.4–10.5)
Chloride: 98 mEq/L (ref 96–112)
Creatinine, Ser: 2.85 mg/dL — ABNORMAL HIGH (ref 0.50–1.35)
GFR calc Af Amer: 28 mL/min — ABNORMAL LOW (ref >90)
GFR calc non Af Amer: 24 mL/min — ABNORMAL LOW (ref >90)
Glucose, Bld: 105 mg/dL — ABNORMAL HIGH (ref 70–99)
Potassium: 3.9 mEq/L (ref 3.5–5.1)
Sodium: 135 mEq/L (ref 135–145)
Sodium: 133 mEq/L — ABNORMAL LOW (ref 135–145)

## 2011-04-11 NOTE — Plan of Care (Signed)
Problem: Phase I Progression Outcomes Goal: EF % per last Echo/documented,Core Reminder form on chart EF 30% WITH CARDIAC CATH 04/08/11

## 2011-04-11 NOTE — Progress Notes (Signed)
Patient ID: [name], male   DOB: 07-17-1960, 50 y.o.   MRN: [id number] Subjective:  Dyspnea improved. Cough improved. Peripheral edema improved. Objective:  Vital Signs in the last 24 hours: Temp:  [98.1 F (36.7 C)] 98.1 F (36.7 C) (12/02 0449) Pulse Rate:  [87-96] 90  (12/02 0449) Resp:  [16-18] 16  (12/02 0449) BP: (138-152)/(77-81) 148/81 mmHg (12/02 0449) SpO2:  [93 %-96 %] 95 % (12/02 0805) Weight:  [75.751 kg (167 lb)] 167 lb (75.751 kg) (12/02 0449)  Intake/Output from previous day: 12/01 0701 - 12/02 0700 In: 1104 [P.O.:1020; I.V.:84] Out: 3125 [Urine:3125] Intake/Output from this shift: Total I/O In: 360 [P.O.:360] Out: 375 [Urine:375]  Physical Exam: Well appearing middle aged man, NAD HEENT: Unremarkable Neck:  No JVD, no thyromegally Lungs:  Clear except for basilar rales. HEART:  Regular rate rhythm, no murmurs, no rubs, no clicks. S3 gallop. Abd:  Flat, positive bowel sounds, no organomegally, no rebound, no guarding Ext:  2 plus pulses, trace edema, no cyanosis, no clubbing. No hematoma. Skin:  No rashes no nodules Neuro:  CN II through XII intact, motor grossly intact  Lab Results:  Basename 04/08/11 1614  WBC 8.8  HGB 10.7*  PLT 221    Basename 04/11/11 0642 04/11/11 0003  NA 133* 135  K 3.9 3.5  CL 100 98  CO2 22 24  GLUCOSE 105* 108*  BUN 46* 49*  CREATININE 2.86* 2.85*   No results found for this basename: TROPONINI:2,CK,MB:2 in the last 72 hours Hepatic Function Panel No results found for this basename: PROT,ALBUMIN,AST,ALT,ALKPHOS,BILITOT,BILIDIR,IBILI in the last 72 hours No results found for this basename: CHOL in the last 72 hours No results found for this basename: PROTIME in the last 72 hours  Cardiac Studies: Right and left heart cath reviewed. Tele - NSR. Assessment/Plan:  1. Acute on chronic systolic heart failure - Continue diuretic therapy. Continue afterload reduction and ionotropes. Follow daily weights. Will  discontinue milrinone this p.m. 2. COPD - continue bronchodilators  3. Stage 3 renal insufficiency - Will follow creatinine.   LOS: 3 days    Lewayne Bunting 04/11/2011, 9:32 AM

## 2011-04-12 ENCOUNTER — Inpatient Hospital Stay (HOSPITAL_COMMUNITY): Payer: Medicaid Other

## 2011-04-12 ENCOUNTER — Institutional Professional Consult (permissible substitution): Payer: Self-pay | Admitting: Critical Care Medicine

## 2011-04-12 ENCOUNTER — Encounter (HOSPITAL_COMMUNITY): Payer: Self-pay | Admitting: Pulmonary Disease

## 2011-04-12 DIAGNOSIS — R0602 Shortness of breath: Secondary | ICD-10-CM

## 2011-04-12 DIAGNOSIS — J449 Chronic obstructive pulmonary disease, unspecified: Secondary | ICD-10-CM

## 2011-04-12 DIAGNOSIS — R570 Cardiogenic shock: Secondary | ICD-10-CM

## 2011-04-12 DIAGNOSIS — I509 Heart failure, unspecified: Secondary | ICD-10-CM

## 2011-04-12 DIAGNOSIS — I5022 Chronic systolic (congestive) heart failure: Secondary | ICD-10-CM

## 2011-04-12 LAB — CBC
HCT: 34.9 % — ABNORMAL LOW (ref 39.0–52.0)
Hemoglobin: 11.6 g/dL — ABNORMAL LOW (ref 13.0–17.0)
RBC: 3.81 MIL/uL — ABNORMAL LOW (ref 4.22–5.81)
WBC: 5.5 10*3/uL (ref 4.0–10.5)

## 2011-04-12 LAB — BASIC METABOLIC PANEL
BUN: 44 mg/dL — ABNORMAL HIGH (ref 6–23)
CO2: 22 mEq/L (ref 19–32)
Chloride: 98 mEq/L (ref 96–112)
Chloride: 100 mEq/L (ref 96–112)
GFR calc Af Amer: 26 mL/min — ABNORMAL LOW (ref >90)
GFR calc non Af Amer: 22 mL/min — ABNORMAL LOW (ref >90)
GFR calc non Af Amer: 23 mL/min — ABNORMAL LOW (ref >90)
Glucose, Bld: 105 mg/dL — ABNORMAL HIGH (ref 70–99)
Potassium: 3.9 mEq/L (ref 3.5–5.1)
Potassium: 4.2 mEq/L (ref 3.5–5.1)
Sodium: 136 mEq/L (ref 135–145)
Sodium: 136 mEq/L (ref 135–145)

## 2011-04-12 MED ORDER — HYDRALAZINE HCL 50 MG PO TABS
75.0000 mg | ORAL_TABLET | Freq: Three times a day (TID) | ORAL | Status: DC
  Administered 2011-04-12 – 2011-04-13 (×3): 75 mg via ORAL
  Filled 2011-04-12 (×6): qty 1

## 2011-04-12 MED ORDER — HYDRALAZINE HCL 50 MG PO TABS
62.5000 mg | ORAL_TABLET | Freq: Three times a day (TID) | ORAL | Status: DC
  Filled 2011-04-12 (×3): qty 0.5

## 2011-04-12 MED ORDER — HYDRALAZINE HCL 25 MG PO TABS
25.0000 mg | ORAL_TABLET | Freq: Once | ORAL | Status: AC
  Administered 2011-04-12: 25 mg via ORAL
  Filled 2011-04-12: qty 1

## 2011-04-12 NOTE — Consult Note (Signed)
HISTORY of PRESENT ILLNESS:  [name] is a 50 y.o. male, former smoker (20pk yr) with PMH of HTN, Systolic CHF (EF 45/40 25-30%, diffuse hypokinesis), CKD (baseline cr 2.3), HLD, COPD, OSA, recent admit from 11/14-11/19 for decompensated CHF.  Pt admitted on 04/08/2011 with increased weight gain, LE swelling and SOB.  Had cardiogenic shock after right and left heart catheterization.  Normal coronaries per cath.  12/3 PCCM consulted for COPD optimization.  Pt indicates at baseline he can walk approx 50 yards before requiring rest. Prior to admit noted increased weight gain, LE swelling, 3 pillows to sleep at night, and worsening SOB.  Denies fevers, chills, chest pain, pain on inspiration.       Past Medical History  Diagnosis Date  . Hypertension   . Systolic heart failure     EF 98-11%  . Chronic kidney disease (CKD), stage III (moderate)     baseline cr 2.3  . Hyperlipidemia   . COPD (chronic obstructive pulmonary disease)   . Peripheral vascular disease   . ETOH abuse   . Renal cyst     left, complex    No past surgical history on file.  Family History  Problem Relation Age of Onset  . Brain cancer Mother   . Pancreatic cancer Father   . Heart failure Paternal Uncle   . Heart disease Paternal Uncle   . Heart failure Paternal Grandfather   . Heart disease Paternal Grandfather      reports that he quit smoking about 10 months ago. His smoking use included Cigarettes. He has a 20 pack-year smoking history. He does not have any smokeless tobacco history on file. He reports that he does not drink alcohol or use illicit drugs.  No Known Allergies  Scheduled Medications    . albuterol  2.5 mg Nebulization TID  . aspirin  325 mg Oral Daily  . carvedilol  12.5 mg Oral Q12H  . heparin  5,000 Units Subcutaneous Q8H  . hydrALAZINE  25 mg Oral Once  . hydrALAZINE  75 mg Oral Q8H  . isosorbide mononitrate  30 mg Oral Daily  . potassium chloride  20 mEq Oral Daily  .  sodium chloride  3 mL Intravenous Q12H  . tiotropium  18 mcg Inhalation Daily  . DISCONTD: furosemide  80 mg Intravenous BID  . DISCONTD: hydrALAZINE  50 mg Oral Q8H  . DISCONTD: hydrALAZINE  62.5 mg Oral Q8H   Prescriptions prior to admission  Medication Sig Dispense Refill  . albuterol (PROVENTIL) (5 MG/ML) 0.5% nebulizer solution Take 0.5 mLs (2.5 mg total) by nebulization every 2 (two) hours as needed for wheezing or shortness of breath.  20 mL  2  . ALPRAZolam (XANAX) 0.5 MG tablet Take 1 tablet (0.5 mg total) by mouth 3 (three) times daily as needed. For anxiety  20 tablet  0  . aspirin EC 325 MG EC tablet Take 1 tablet (325 mg total) by mouth daily.  30 tablet  0  . calcium carbonate (TUMS - DOSED IN MG ELEMENTAL CALCIUM) 500 MG chewable tablet Chew 1 tablet by mouth 2 (two) times daily as needed. For heartburn       . carvedilol (COREG) 25 MG tablet Take 1 tablet (25 mg total) by mouth 2 (two) times daily.  30 tablet  3  . furosemide (LASIX) 80 MG tablet Take 1 tablet (80 mg total) by mouth 2 (two) times daily.  30 tablet  1  . hydrALAZINE (APRESOLINE) 25  MG tablet Take 1 tablet (25 mg total) by mouth 3 (three) times daily.  90 tablet  6  . hydrocodone-acetaminophen (LORCET-HD) 5-500 MG per capsule Take 1 capsule by mouth every 6 (six) hours as needed. For pain  30 capsule  0  . isosorbide dinitrate (ISORDIL) 10 MG tablet Take 1 tablet (10 mg total) by mouth 3 (three) times daily.  30 tablet  2  . tiotropium (SPIRIVA HANDIHALER) 18 MCG inhalation capsule Place 1 capsule (18 mcg total) into inhaler and inhale daily.  30 capsule  12  . DISCONTD: calcium carbonate (TUMS - DOSED IN MG ELEMENTAL CALCIUM) 500 MG chewable tablet Chew 1 tablet (200 mg of elemental calcium total) by mouth 2 (two) times daily as needed.  30 tablet  0     ROS: Gen: Denies fever, chills, increased weight prior to admit, fatigue, night sweats HEENT: Denies blurred vision, double vision, hearing loss, tinnitus,  sinus congestion, rhinorrhea, sore throat, neck stiffness, dysphagia PULM: SEE HPI CV: Denies chest pain.  Otherwise see HPI 3  GI: Denies abdominal pain, nausea, vomiting, diarrhea, hematochezia, melena, constipation, change in bowel habits GU: Denies dysuria, hematuria, polyuria, oliguria, urethral discharge Endocrine: Denies hot or cold intolerance, polyuria, polyphagia or appetite change Derm: Denies rash, dry skin, scaling or peeling skin change Heme: Denies easy bruising, bleeding, bleeding gums Neuro: Denies headache, numbness, weakness, slurred speech, loss of memory or conciousness   Blood pressure 153/84, pulse 91, temperature 98.4 F (36.9 C), temperature source Oral, resp. rate 22, height 5\' 8"  (1.727 m), weight 165 lb 5.5 oz (75 kg), SpO2 92.00%.  PHYICAL EXAM: General: wdwn adult male in NAD Neuro: aaox4, MAE, speech clear CV: s1s2 rrr, no m/r/g PULM:resp's even/non-labored, lungs bilaterally with faint scattered wheezes GI: obese/soft, bsx4 active Extremities: warm/dry, no edema   LABS BMET    Component Value Date/Time   NA 136 04/12/2011 0615   K 4.2 04/12/2011 0615   CL 100 04/12/2011 0615   CO2 22 04/12/2011 0615   GLUCOSE 105* 04/12/2011 0615   BUN 44* 04/12/2011 0615   CREATININE 3.01* 04/12/2011 0615   CALCIUM 9.3 04/12/2011 0615   GFRNONAA 23* 04/12/2011 0615   GFRAA 26* 04/12/2011 0615    CBC    Component Value Date/Time   WBC 5.5 04/12/2011 0217   RBC 3.81* 04/12/2011 0217   HGB 11.6* 04/12/2011 0217   HCT 34.9* 04/12/2011 0217   PLT 345 04/12/2011 0217   MCV 91.6 04/12/2011 0217   MCH 30.4 04/12/2011 0217   MCHC 33.2 04/12/2011 0217   RDW 14.8 04/12/2011 0217   LYMPHSABS 1.4 03/29/2011 0530   MONOABS 1.3* 03/29/2011 0530   EOSABS 0.2 03/29/2011 0530   BASOSABS 0.0 03/29/2011 0530     ABG    Component Value Date/Time   PHART 7.341* 04/08/2011 0909   HCO3 19.4* 04/08/2011 0929   TCO2 21 04/08/2011 0929   ACIDBASEDEF 6.0* 04/08/2011 0929   O2SAT  54.0 04/08/2011 0929    RADIOLOGIC DATA 12/3 CXR>>>no acute infiltrate, edema or effusion.  Cardiomegaly.     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  Decreased DLCO likely related to acute decompensated CHF.  No real improvement with BD's. ? Accuracy of PFT's given acute illness.  Normal RV on ECHO 11/19.    It is unclear to me why he has an elevated total lung cap if restrictive lung process , accuracy? He has a strong h/o smoking ad some  wheezing now ( chf related?), but no response on pft He has significant exposure occupational that could have resulted in restriction and intr markings in addition to chf  PLAN: -repeat PFT's as outpatient when CHF more stable -check CT chest to eval for ILD given hx of occupational exposures -continue albuterol, spiriva, may benefit from inhaled steroid but would like to re-eval PFT's prior to initiation.  -scheduled follow up for outpatient pulmonary  Acute on Chronic Systolic CHF PLAN: -afterload reduction per Cardiology  Will follow in am    Mcarthur Rossetti. Tyson Alias, MD, FACP Pgr: [phone] [facility]  Canary Brim, NP-C [facility] Pgr: [phone]  04/12/2011

## 2011-04-12 NOTE — Progress Notes (Signed)
UR Completed.   [name] 04/12/2011 [phone]  

## 2011-04-12 NOTE — Progress Notes (Signed)
Advanced Heart Failure Rounding Note   Subjective:    Mr. Toledo was admitted 11/29 with cardiogenic shock after right and left heart catheterization.  RA 18, RV mean 21, PA mean 57, PCW 38 with V 50, Fick Cardiac output/index 2.9/1.5.  PVR 6.6 [place] (although felt overestimated due to MR).  Normal coronaries.    PFTS and ABIs reviewed carefully today: ABIs: Right 1.34, Left 1.18  PFTs: FVC 64% predicted, FEV1 57% predicted, FEV1/FVC 88% predicted, FEF 25-75% 42% predicted, DLCO 61% predicted.  No significant response to bronchodilators.    Milrinone stopped yesterday.  Feels the best he has felt in a really long time.  Walked 6 lengths of the hallway without stopping.  Had to quit because of leg pain not SOB.  No dizziness.  No orthopnea, PND.     Objective:    Vital Signs:   Temp:  [97.4 F (36.3 C)-98.4 F (36.9 C)] 98.4 F (36.9 C) (12/03 0553) Pulse Rate:  [91-97] 91  (12/03 0553) Resp:  [18-22] 22  (12/03 0553) BP: (140-179)/(79-91) 153/84 mmHg (12/03 0553) SpO2:  [93 %-96 %] 96 % (12/03 0553) Weight:  [75 kg (165 lb 5.5 oz)] 165 lb 5.5 oz (75 kg) (12/03 0553) Last BM Date: 04/11/11  24-hour weight change: Weight change: -0.751 kg (-1 lb 10.5 oz)  Intake/Output:  12/02 0701 - 12/03 0700 In: 1349.6 [P.O.:1140; I.V.:209.6] Out: 3275 [Urine:3275]   Physical Exam: General:  Well appearing. No resp difficulty HEENT: normal Neck: thick supple. JVP 7-8 difficult to assess . Carotids 2+ bilat; no bruits. No lymphadenopathy or thryomegaly appreciated. Cor: PMI nondisplaced. Distant regular rate & rhythm.  2/6 murmurs. Lungs: occ wheeze Abdomen: obese. soft, nontender, nondistended. No hepatosplenomegaly. No bruits or masses. Good bowel sounds. Extremities: no cyanosis, clubbing, rash, edema, DP +2 on L and +1 on R Neuro: alert & orientedx3, cranial nerves grossly intact. moves all 4 extremities w/o difficulty. Affect pleasant  Telemetry: SR 90s  Labs: Basic Metabolic  Panel:  Lab 04/12/11 0615 04/12/11 0217 04/11/11 0642 04/11/11 0003 04/10/11 0500 04/09/11 0554  NA 136 136 133* 135 136 --  K 4.2 3.9 3.9 3.5 3.7 --  CL 100 98 100 98 102 --  CO2 22 24 22 24 22  --  GLUCOSE 105* 114* 105* 108* 101* --  BUN 44* 46* 46* 49* 48* --  CREATININE 3.01* 3.08* 2.86* 2.85* 2.71* --  CALCIUM 9.3 9.1 8.9 -- -- --  MG -- -- -- -- -- 2.3  PHOS -- -- -- -- -- --    Liver Function Tests: No results found for this basename: AST:5,ALT:5,ALKPHOS:5,BILITOT:5,PROT:5,ALBUMIN:5 in the last 168 hours No results found for this basename: LIPASE:5,AMYLASE:5 in the last 168 hours No results found for this basename: AMMONIA:3 in the last 168 hours  CBC:  Lab 04/12/11 0217 04/08/11 1614 04/05/11 1545  WBC 5.5 8.8 11.2*  NEUTROABS -- -- --  HGB 11.6* 10.7* 12.1*  HCT 34.9* 32.2* 36.4*  MCV 91.6 91.2 92.2  PLT 345 221 251    Cardiac Enzymes: No results found for this basename: CKTOTAL:5,CKMB:5,CKMBINDEX:5,TROPONINI:5 in the last 168 hours  BNP:  Lab 04/12/11 0217  POCBNP 6260.0*    CBG:  Lab 04/09/11 2114 04/09/11 1718 04/09/11 1205 04/09/11 0752 04/08/11 2138  GLUCAP 124* 92 98 101* 114*    Coagulation Studies: No results found for this basename: LABPROT:5,INR:5 in the last 72 hours  Other results:   Imaging: Dg Chest 2 View  04/12/2011  *RADIOLOGY REPORT*  Clinical Data: Hypertension and COPD.  Leg pain.  CHEST - 2 VIEW  Comparison: 03/22/2011  Findings: The cardiopericardial silhouette is enlarged. The lungs are clear without focal infiltrate, edema, pneumothorax or pleural effusion. Imaged bony structures of the thorax are intact.  IMPRESSION: No acute cardiopulmonary process.  Original Report Authenticated By: ERIC A. MANSELL, M.D.     Medications:     Scheduled Medications:    . albuterol  2.5 mg Nebulization TID  . aspirin  325 mg Oral Daily  . carvedilol  12.5 mg Oral Q12H  . furosemide  80 mg Intravenous BID  . heparin  5,000 Units  Subcutaneous Q8H  . hydrALAZINE  50 mg Oral Q8H  . isosorbide mononitrate  30 mg Oral Daily  . potassium chloride  20 mEq Oral Daily  . sodium chloride  3 mL Intravenous Q12H  . tiotropium  18 mcg Inhalation Daily    Infusions:    . sodium chloride 10 mL/hr at 04/09/11 2040  . milrinone 0.25 mcg/kg/min (04/10/11 2032)    PRN Medications: sodium chloride, acetaminophen, albuterol, ALPRAZolam, ALPRAZolam, ondansetron (ZOFRAN) IV, oxyCODONE, sodium chloride   Assessment:    Cardiogenic Shock  Chronic Systolic Heart Failure  Nonischemic cardiomyopathy, EF 30%  Normal coronaries per cath 04/08/2011  Acute on Chronic Renal Failure, baseline Cr 2.3  No ACEI/ARB  COPD  Restrictive Pulmonary Disease   Plan/Discussion:    Symptomatically much better, UOP remains good but weight relatively unchanged.  Inotrope support stopped yesterday without further titration of afterload reduction.  Will titrate hydralazine/nitrate at this time with initial SVR >2880.  Renal function slightly worse today, will hold diuretics at this time.    PFTs suggest moderate to severe small airway disease and also restriction, expected this to be much worse.  Will continue with carvedilol at this time.  Continue bronchodilator therapy.  Will have pulmonary evaluate while in the hospital to optimize therapy.     Length of Stay: [address] [name], [place]  04/12/2011, 8:05 AM   Patient seen and examined with Albert Blossom PA-C. We discussed all aspects of the encounter. I agree with the assessment and plan as stated above. Much improved despite no significant change in weight. Will continue to try to optimize med regimen. On PFTs patient appears to get much worse with BDs, Will ask Pulm to see to help optimize this regimen. Check noncontrast CT of ab/pelvis to look for spinal stenosis as cause of pseudoclaudication (ABIs ok). Hopefully home 24-48 hours.   Reuel Boom BensimhonMD

## 2011-04-12 NOTE — Progress Notes (Signed)
Clinical Social worker completed the psychosocial assessment which can be found in the shadow chart . MD please be advised-  Pt reported having symptoms of depression due to lack of support in the home.Christmas is also a very difficult time for the patient because his wife died several years ago around this time. As well, Patient has not been able to work due to his illness and has no source of income at this time. Pt has applied for Medicaid and is filing for disability but both are still pending. Pt qualifies for the 34 day supply of HF medications.   Please call if you have any questions. [phone]. CSW will continue to follow.

## 2011-04-13 ENCOUNTER — Other Ambulatory Visit: Payer: Self-pay | Admitting: *Deleted

## 2011-04-13 DIAGNOSIS — I509 Heart failure, unspecified: Secondary | ICD-10-CM

## 2011-04-13 DIAGNOSIS — N189 Chronic kidney disease, unspecified: Secondary | ICD-10-CM

## 2011-04-13 DIAGNOSIS — J4489 Other specified chronic obstructive pulmonary disease: Secondary | ICD-10-CM

## 2011-04-13 DIAGNOSIS — J449 Chronic obstructive pulmonary disease, unspecified: Secondary | ICD-10-CM

## 2011-04-13 DIAGNOSIS — R0602 Shortness of breath: Secondary | ICD-10-CM

## 2011-04-13 LAB — BASIC METABOLIC PANEL
CO2: 22 mEq/L (ref 19–32)
Chloride: 98 mEq/L (ref 96–112)
GFR calc non Af Amer: 23 mL/min — ABNORMAL LOW (ref >90)
Glucose, Bld: 99 mg/dL (ref 70–99)
Potassium: 4.1 mEq/L (ref 3.5–5.1)
Sodium: 134 mEq/L — ABNORMAL LOW (ref 135–145)

## 2011-04-13 MED ORDER — CITALOPRAM HYDROBROMIDE 10 MG PO TABS: 10.0000 mg | ORAL_TABLET | Freq: Every day | ORAL | Status: DC

## 2011-04-13 MED ORDER — HYDRALAZINE HCL 25 MG PO TABS: 75.0000 mg | ORAL_TABLET | Freq: Three times a day (TID) | ORAL | Status: DC

## 2011-04-13 MED ORDER — BISOPROLOL FUMARATE 5 MG PO TABS: 5.0000 mg | ORAL_TABLET | Freq: Every day | ORAL | Status: DC

## 2011-04-13 MED ORDER — IPRATROPIUM-ALBUTEROL 18-103 MCG/ACT IN AERO: 2.0000 | INHALATION_SPRAY | RESPIRATORY_TRACT | Status: DC | PRN

## 2011-04-13 MED ORDER — IPRATROPIUM-ALBUTEROL 18-103 MCG/ACT IN AERO
2.0000 | INHALATION_SPRAY | RESPIRATORY_TRACT | Status: DC | PRN
  Filled 2011-04-13: qty 14.7

## 2011-04-13 MED ORDER — ISOSORBIDE MONONITRATE ER 30 MG PO TB24: 30.0000 mg | ORAL_TABLET | Freq: Two times a day (BID) | ORAL | Status: DC

## 2011-04-13 NOTE — Plan of Care (Signed)
Problem: Phase I Progression Outcomes Goal: EF % per last Echo/documented,Core Reminder form on chart Outcome: Completed/Met Date Met:  04/13/11 25-30% as of 03/25/11

## 2011-04-13 NOTE — Progress Notes (Signed)
IV d/c'd. Tele d/c'd. Pt d/c'd to home. Home meds and d/c instructions discussed with pt. Pt denies any questions or concerns at this time. Pt leaving unit via wheelchair and appears in no acute distress. Karey Stucki RN 

## 2011-04-13 NOTE — Progress Notes (Signed)
[name] is a 50 y.o. male fomer smoker admitted on 04/08/2011 with dyspnea, CHF exacerbation and cardiogenic shock.  Lt heart cath showed normal coronaries.  Pulmonary consulted for COPD management. PMHx HTN, Systolic CHF (EF 25 to 30%), CKD (Baseline creat 2.3), Hyperlipidemia, COPD, OSA, ETOH, PVD  PFT 11/30>>FEV1 2.10(57%), FEV1% 69, TLC 7.37(112%), DLCO 61%, no BD response CT chest w/o contrast 12/03>>basilar scarring  SUBJECTIVE:  Breathing improved.  States he gets hoarseness from spiriva, and not sure if this helps.  He has tried advair before, but developed thrush and didn't feel this helped either.  He has used ventolin and albuterol nebulizer, and feels these work the best.  He is concerned about how he will be able to afford his inhalers.  OBJECTIVE:  Blood pressure 160/97, pulse 87, temperature 97.9 F (36.6 C), temperature source Oral, resp. rate 18, height 5\' 8"  (1.727 m), weight 167 lb 3.2 oz (75.841 kg), SpO2 94.00%. Body mass index is 25.42 kg/(m^2).    Intake/Output Summary (Last 24 hours) at 04/13/11 9811 Last data filed at 04/13/11 0859  Gross per 24 hour  Intake    600 ml  Output   3875 ml  Net  -3275 ml   General - obese, no distress HEENT - no sinus tenderness, no LAN Cardiac - s1s2 regular, no murmur Chest - prolonged exhalation, no wheeze/rales Abd - obese, soft, nontender Ext - no edema Neuro - normal strength Psych - normal mood, behavior  Lab Results  Component Value Date   CREATININE 2.94* 04/13/2011   BUN 45* 04/13/2011   NA 134* 04/13/2011   K 4.1 04/13/2011   CL 98 04/13/2011   CO2 22 04/13/2011   Lab Results  Component Value Date   WBC 5.5 04/12/2011   HGB 11.6* 04/12/2011   HCT 34.9* 04/12/2011   MCV 91.6 04/12/2011   PLT 345 04/12/2011    Ct Abdomen Pelvis Wo Contrast  04/12/2011  *RADIOLOGY REPORT*  Clinical Data:  History of occupational exposure.  Evaluate for interstitial lung disease.  The patient complains of shortness of  breath and low abdominal pain.  Renal failure.  CT CHEST, ABDOMEN AND PELVIS WITHOUT CONTRAST  Technique:  Multidetector CT imaging of the chest, abdomen and pelvis was performed following the standard protocol without IV contrast.  Comparison:  Chest radiographs 04/12/2011.  Abdominal MRI 03/23/2011.  CT CHEST  Findings:  Small mediastinal lymph nodes do not appear pathologically enlarged.  Hilar assessment is limited without contrast, however, no significant enlarged lymph nodes are identified.  The great vessels appear normal.  There are faint coronary artery calcifications.  There is a small hiatal hernia.  No pleural or pericardial effusion is present.  There is no evidence of interstitial lung disease.  Linear scarring is present in both lower lobes.  There is no confluent airspace opacity.  IMPRESSION:  1.  No acute chest findings or evidence of interstitial lung disease. 2.  Linear scarring in both lung bases. 3.  Coronary artery calcifications and small hiatal hernia.  CT ABDOMEN AND PELVIS  Findings:  There is stable low density prominence of both adrenal glands consistent with hyperplasia.  The liver, spleen, pancreas, gallbladder and biliary system appear normal.  Several low density renal lesions are present bilaterally.  The largest on the left measures 3.5 cm on image 62, unchanged from the mildly complicated cystic lesion on prior MRI.  The other lesions also appear unchanged.  There is no hydronephrosis.  The bowel gas pattern  is normal.  The appendix appears normal. There are no enlarged abdominal pelvic lymph nodes.  There is no ascites or inflammatory change.  The urinary bladder, prostate gland and seminal vesicles appear unremarkable.  Bridging osteophytes at the lumbosacral junction on the left are noted. There is no acute osseous abnormality. There are small inguinal lymph nodes, right greater than left with possible mild surrounding soft tissue stranding in the right groin.  IMPRESSION:  1.   Possible mild soft tissue stranding in the right groin. Adjacent lymph nodes do not appear significantly enlarged. 2.  No other acute abdominal pelvic findings. 3.  Stable bilateral renal cysts.  Original Report Authenticated By: Gerrianne Scale, M.D.   Dg Chest 2 View  04/12/2011  *RADIOLOGY REPORT*  Clinical Data: Hypertension and COPD.  Leg pain.  CHEST - 2 VIEW  Comparison: 03/22/2011  Findings: The cardiopericardial silhouette is enlarged. The lungs are clear without focal infiltrate, edema, pneumothorax or pleural effusion. Imaged bony structures of the thorax are intact.  IMPRESSION: No acute cardiopulmonary process.  Original Report Authenticated By: ERIC A. MANSELL, M.D.   Ct Chest Wo Contrast  04/12/2011  *RADIOLOGY REPORT*  Clinical Data:  History of occupational exposure.  Evaluate for interstitial lung disease.  The patient complains of shortness of breath and low abdominal pain.  Renal failure.  CT CHEST, ABDOMEN AND PELVIS WITHOUT CONTRAST  Technique:  Multidetector CT imaging of the chest, abdomen and pelvis was performed following the standard protocol without IV contrast.  Comparison:  Chest radiographs 04/12/2011.  Abdominal MRI 03/23/2011.  CT CHEST  Findings:  Small mediastinal lymph nodes do not appear pathologically enlarged.  Hilar assessment is limited without contrast, however, no significant enlarged lymph nodes are identified.  The great vessels appear normal.  There are faint coronary artery calcifications.  There is a small hiatal hernia.  No pleural or pericardial effusion is present.  There is no evidence of interstitial lung disease.  Linear scarring is present in both lower lobes.  There is no confluent airspace opacity.  IMPRESSION:  1.  No acute chest findings or evidence of interstitial lung disease. 2.  Linear scarring in both lung bases. 3.  Coronary artery calcifications and small hiatal hernia.  CT ABDOMEN AND PELVIS  Findings:  There is stable low density prominence  of both adrenal glands consistent with hyperplasia.  The liver, spleen, pancreas, gallbladder and biliary system appear normal.  Several low density renal lesions are present bilaterally.  The largest on the left measures 3.5 cm on image 62, unchanged from the mildly complicated cystic lesion on prior MRI.  The other lesions also appear unchanged.  There is no hydronephrosis.  The bowel gas pattern is normal.  The appendix appears normal. There are no enlarged abdominal pelvic lymph nodes.  There is no ascites or inflammatory change.  The urinary bladder, prostate gland and seminal vesicles appear unremarkable.  Bridging osteophytes at the lumbosacral junction on the left are noted. There is no acute osseous abnormality. There are small inguinal lymph nodes, right greater than left with possible mild surrounding soft tissue stranding in the right groin.  IMPRESSION:  1.  Possible mild soft tissue stranding in the right groin. Adjacent lymph nodes do not appear significantly enlarged. 2.  No other acute abdominal pelvic findings. 3.  Stable bilateral renal cysts.  Original Report Authenticated By: Gerrianne Scale, M.D.    ASSESSMENT/PLAN:  COPD -his PFT's are consistent with COPD, airtrapping, and decreased diffusion capacity -will  d/c spiriva>>causes hoarseness, and pt states he would not be able to afford this -was not able to tolerate advair previously>>developed thrush -will start combivent, and continue albuterol nebulizer prn -check ambulatory oximetry -will need outpt f/u with pulmonary>>pt concerned about having to come to [place] for this>>could arrange for evaluation by Dr. Jonell Cluck in [place]  OSA -will need to continue CPAP as outpt  Okay for d/c home from pulmonary standpoint if ambulatory oximetry okay  Cay Kath Pager:  [phone] 04/13/2011, 9:21 AM

## 2011-04-13 NOTE — Discharge Summary (Signed)
Patient ID: HALTON NEAS MRN: [id number] DOB/AGE: 05/23/1960 50 y.o.  Admit date: 04/08/2011 Discharge date: 04/13/2011  Primary Discharge Diagnosis 1) Cardiogenic Shock requiring inotrope therapy 2) Chronic systolic heart failure 3) Nonischemic cardiomyopathy, EF 30% 4) Normal coronary arteries per catheterization this admission 5) Acute on chronic renal failure      - Discharge Cr 2.94 6) COPD 7) Depression/anxiety    Secondary Discharge Diagnosis 1) History of alcohol and tobacco abuse, quit 06/2010 2) Hyperlipidemia 3) Hypertension 4) Renal Cyst, complex  Significant Diagnostic Studies: (Please see full reports in Results Review) 1) Right and Left Heart Catheterization, 04/09/2011 2) PFT's  3) ABI's 4) CXR 5) CT Chest, Abdomen, and Pelvis without contrast  Hospital Course:  Mr. Hanton is a 50 year old gentlemen with the above stated problem list who was referred to the HF clinic for follow up post hospitalization for HF exacerbation.  In the clinic patient's was tachypneic with complaints of NYHA III-IIIB symptoms.  With his history of COPD it was unclear if limiting factor was pulmonary or cardiac in nature.  It was decided to proceed with right and left heart catheterization for hemodynamics.  Risk, benefits and alternatives were discussed in full with patient understanding that renal failure with possible need for dialysis was an option, he agreed to proceed.  He was brought to the [facility] by Dr. [name] on 04/08/2011.  Right heart catheterization demonstrated decompensated HF with RA = 18, RV = 67/16/21, PA = 72/43 (57),  PCW = 38 v = 50, Fick cardiac output/index = 2.9/1.5, PVR = 6.6 Woods, SVR = 2883 Dynes.  Left heart with normal coronaries.  He was admitted from the [facility] for diuresis with inotropic therapy.   Placed on milrinone for 4 days with improvement in symptoms, his afterload reducers were titrated at this time due to an SVR >28800.  His weight was relatively  unchanged during hospitalization but did exhibit good UOP.  He was transitioned to oral lasix.  With his lung disease carvedilol was changed to bisoprolol.  Currently patient awaiting medicaid acceptance therefore he was given a month supply of his HF medication prior to discharge.    He had complaints of right greater than left lower extremity pain with ambulation.  ABIs ordered and were noted to be ok.  CT abdomen and pelvis did not show spinal stenosis.  Will follow up in the outpatient setting.  Renal cyst noted on CT, will have outpatient renal ultrasound and follow up with nephrology on an outpatient basis.    Pulmonary consult was obtained during his stay after PFTs showed FVC 64% predicted, FEV1 57% predicted, FEV1/FVC 88% predicted, FEF 25-75% 42% predicted, DLCO 61% predicted. No significant response to bronchodilators.  CT chest significant for basilar scarring.  Dr. Craige Cotta states findings consistent with COPD, air trapping and decreased diffusion capacity.  Spiriva was discontinued due to hoarseness, advair not used due to thrush but patient started on combivent and continued on albuterol nebulizers.  Will follow up in the outpatient setting.  Asked to continue CPAP at night.  Ambulated in halls without O2 with sats remaining above 96%.  Patient stable for discharge and further outpatient follow up.    Discharge Info: Blood pressure 168/116, pulse 91, temperature 97.9 F (36.6 C), temperature source Oral, resp. rate 18, height 5\' 8"  (1.727 m), weight 75.841 kg (167 lb 3.2 oz), SpO2 94.00%.  Weight change: 0.841 kg (1 lb 13.7 oz)   Results for orders placed during  the hospital encounter of 04/08/11 (from the past 24 hour(s))  BASIC METABOLIC PANEL     Status: Abnormal   Collection Time   04/13/11  6:35 AM      Component Value Range   Sodium 134 (*) 135 - 145 (mEq/L)   Potassium 4.1  3.5 - 5.1 (mEq/L)   Chloride 98  96 - 112 (mEq/L)   CO2 22  19 - 32 (mEq/L)   Glucose, Bld 99  70 - 99  (mg/dL)   BUN 45 (*) 6 - 23 (mg/dL)   Creatinine, Ser 1.61 (*) 0.50 - 1.35 (mg/dL)   Calcium 9.1  8.4 - 09.6 (mg/dL)   GFR calc non Af Amer 23 (*) >90 (mL/min)   GFR calc Af Amer 27 (*) >90 (mL/min)   Physical Exam:  General: Well appearing. No resp difficulty  HEENT: normal  Neck: thick supple. JVP 7-8 difficult to assess . Carotids 2+ bilat; no bruits. No lymphadenopathy or thryomegaly appreciated.  Cor: PMI nondisplaced. Distant regular rate & rhythm. 2/6 murmurs.  Lungs: CTA  Abdomen: obese. soft, nontender, nondistended. No hepatosplenomegaly. No bruits or masses. Good bowel sounds.  Extremities: no cyanosis, clubbing, rash, edema, DP +2 on L and +1 on R  Neuro: alert & orientedx3, cranial nerves grossly intact. moves all 4 extremities w/o difficulty. Affect pleasant   Discharge Medications: Current Discharge Medication List    START taking these medications   Details  albuterol-ipratropium (COMBIVENT) 18-103 MCG/ACT inhaler Inhale 2 puffs into the lungs every 4 (four) hours as needed for wheezing or shortness of breath (prn cough). Qty: 1 Inhaler, Refills: 3    bisoprolol (ZEBETA) 5 MG tablet Take 1 tablet (5 mg total) by mouth daily. Qty: 30 tablet, Refills: 6    citalopram (CELEXA) 10 MG tablet Take 1 tablet (10 mg total) by mouth daily. Qty: 30 tablet, Refills: 6    isosorbide mononitrate (IMDUR) 30 MG 24 hr tablet Take 1 tablet (30 mg total) by mouth 2 (two) times daily. Qty: 60 tablet, Refills: 6      CONTINUE these medications which have CHANGED   Details  hydrALAZINE (APRESOLINE) 25 MG tablet Take 3 tablets (75 mg total) by mouth 3 (three) times daily. Qty: 90 tablet, Refills: 6      CONTINUE these medications which have NOT CHANGED   Details  albuterol (PROVENTIL) (5 MG/ML) 0.5% nebulizer solution Take 0.5 mLs (2.5 mg total) by nebulization every 2 (two) hours as needed for wheezing or shortness of breath. Qty: 20 mL, Refills: 2    ALPRAZolam (XANAX) 0.5  MG tablet Take 1 tablet (0.5 mg total) by mouth 3 (three) times daily as needed. For anxiety Qty: 20 tablet, Refills: 0    calcium carbonate (TUMS - DOSED IN MG ELEMENTAL CALCIUM) 500 MG chewable tablet Chew 1 tablet by mouth 2 (two) times daily as needed. For heartburn     furosemide (LASIX) 80 MG tablet Take 1 tablet (80 mg total) by mouth 2 (two) times daily. Qty: 30 tablet, Refills: 1    hydrocodone-acetaminophen (LORCET-HD) 5-500 MG per capsule Take 1 capsule by mouth every 6 (six) hours as needed. For pain Qty: 30 capsule, Refills: 0      STOP taking these medications     aspirin EC 325 MG EC tablet      carvedilol (COREG) 25 MG tablet      isosorbide dinitrate (ISORDIL) 10 MG tablet      tiotropium (SPIRIVA HANDIHALER) 18 MCG inhalation capsule  Follow-up Plans & Instructions: Discharge Orders    Future Appointments: Provider: Department: Dept Phone: Center:   04/20/2011 9:45 AM [facility] [phone] None   04/30/2011 3:15 PM Sandrea Hughs, MD [facility] [phone] None   05/10/2011 9:00 AM [facility]  None     Future Orders Please Complete By Expires   Diet - low sodium heart healthy      Increase activity slowly      Discharge instructions      Comments:   Do the following things EVERYDAY: Weigh yourself in the morning before breakfast. Write it down and keep it in a log. Take your medicines as prescribed Eat low salt foods-Limit salt (sodium) to 2000mg  per day.  Stay as active as you can everyday    Heart Failure patients record your daily weight using the same scale at the same time of day      (HEART FAILURE PATIENTS) Call MD:  Anytime you have any of the following symptoms: 1) 3 pound weight gain in 24 hours or 5 pounds in 1 week 2) shortness of breath, with or without a dry hacking cough 3) swelling in the hands, feet or stomach 4) if you have to sleep on extra pillows at night in order to breathe.       Contraindication to ACEI at discharge        Follow-up Information    Follow up with Sandrea Hughs, MD on 04/30/2011. (@ 315 )    Contact information:   [address] [facility] [address] [place] [address] [place] [address] [phone]       Follow up with Renal Artery Ultrasound on 05/10/2011. (at 9 am )    Contact information:   [facility]  [address] [place]  [address] [phone]      Follow up with Arvilla Meres, MD on 04/20/2011. (at 9:45 am )    Contact information:   [facility]  [address] [place] [address] [phone]           BRING ALL MEDICATIONS WITH YOU TO FOLLOW UP APPOINTMENTS  Time spent with patient to include physician time: Greater than 40 minutes Signed:  Robbi Garter, PA 04/13/2011, 11:21 AM   Patient seen and examined with Ulyess Blossom PA-C. We discussed all aspects of the encounter. I agree with the assessment and plan as stated above. Ready for d/c today. Long talk about need for close f/u. Coordinated care with outpatient pulmonary. Also discussed need to f/u with renal as he will likely need access placed soon. Will follow closely in HF clinic in several days. Anti-depressant initiated on d/c.   Reuel Boom BensimhonMD

## 2011-04-14 ENCOUNTER — Telehealth: Payer: Self-pay | Admitting: *Deleted

## 2011-04-14 NOTE — Telephone Encounter (Signed)
Message copied by Tommie Sams on Wed Apr 14, 2011  8:41 AM ------      Message from: Coralyn Helling      Created: Wed Apr 14, 2011  7:41 AM       I saw Mr. Yniguez as consult in hospital.  He is scheduled for FOV with Dr. Sherene Sires later this month.  He has both COPD and OSA, and would be better to f/u with me.  Can you please schedule his HFU visit with me.  Thanks.

## 2011-04-14 NOTE — Telephone Encounter (Signed)
Pt is scheduled to come in 04/29/11 at 1:30 for his hfu

## 2011-04-20 ENCOUNTER — Encounter (HOSPITAL_COMMUNITY): Payer: Self-pay

## 2011-04-20 ENCOUNTER — Ambulatory Visit (HOSPITAL_COMMUNITY)
Admit: 2011-04-20 | Discharge: 2011-04-20 | Disposition: A | Discharge status: Home / Self Care | Payer: Medicaid Other | Source: Ambulatory Visit | Attending: Internal Medicine | Admitting: Internal Medicine

## 2011-04-20 VITALS — BP 142/78 | HR 85 | Wt 166.5 lb

## 2011-04-20 DIAGNOSIS — J449 Chronic obstructive pulmonary disease, unspecified: Secondary | ICD-10-CM

## 2011-04-20 DIAGNOSIS — J4489 Other specified chronic obstructive pulmonary disease: Secondary | ICD-10-CM | POA: Insufficient documentation

## 2011-04-20 DIAGNOSIS — I509 Heart failure, unspecified: Secondary | ICD-10-CM | POA: Insufficient documentation

## 2011-04-20 DIAGNOSIS — I10 Essential (primary) hypertension: Secondary | ICD-10-CM | POA: Insufficient documentation

## 2011-04-20 DIAGNOSIS — I5022 Chronic systolic (congestive) heart failure: Secondary | ICD-10-CM | POA: Insufficient documentation

## 2011-04-20 LAB — BASIC METABOLIC PANEL
BUN: 60 mg/dL — ABNORMAL HIGH (ref 6–23)
CO2: 22 mEq/L (ref 19–32)
Calcium: 9.6 mg/dL (ref 8.4–10.5)
Creatinine, Ser: 3.73 mg/dL — ABNORMAL HIGH (ref 0.50–1.35)
GFR calc non Af Amer: 17 mL/min — ABNORMAL LOW (ref >90)
Glucose, Bld: 97 mg/dL (ref 70–99)
Sodium: 138 mEq/L (ref 135–145)

## 2011-04-20 MED ORDER — HYDRALAZINE HCL 25 MG PO TABS: 100.0000 mg | ORAL_TABLET | Freq: Three times a day (TID) | ORAL | Status: DC

## 2011-04-20 MED ORDER — HYDRALAZINE HCL 25 MG PO TABS: 75.0000 mg | ORAL_TABLET | Freq: Three times a day (TID) | ORAL | Status: DC

## 2011-04-20 MED ORDER — BISOPROLOL FUMARATE 5 MG PO TABS: 7.5000 mg | ORAL_TABLET | Freq: Every day | ORAL | Status: DC

## 2011-04-20 NOTE — Assessment & Plan Note (Addendum)
NYHA III.  Volume status looks good today.  I am pleased with how he is doing today, will continue current medical therapy but will titrate bisoprolol to 7.5 mg daily.  Continue to watch low sodium diet and daily weights.  BMET today.  Will refer to renal for renal evaluation and discussion of timing of access placement.  Patient seen and examined with Ulyess Blossom PA-C. We discussed all aspects of the encounter. I agree with the assessment and plan as stated above. Much improved. Check labs today. Renal function will be a problem. Agree with nephrology referral.

## 2011-04-20 NOTE — Assessment & Plan Note (Signed)
Will follow up with Dr. Craige Cotta at the end of the month.

## 2011-04-20 NOTE — Patient Instructions (Addendum)
Increase Bisoprolol to 7.5 mg daily (1 & 1/2 tabs)   Labs today.  Follow up in 4 weeks.    Do the following things EVERYDAY: 1) Weigh yourself in the morning before breakfast. Write it down and keep it in a log. 2) Take your medicines as prescribed 3) Eat low salt foods-Limit salt (sodium) to 2000mg  per day.  4) Stay as active as you can everyday

## 2011-04-20 NOTE — Assessment & Plan Note (Addendum)
As above, increase bisoprolol.

## 2011-04-20 NOTE — Progress Notes (Signed)
Referring Physician: Dr. Eda Paschal Primary Physician: [facility] in [place]    HPI:  [name] is a 50 y.o. gentlemen with history of COPD, systolic HF with EF 30%, CRI with baseline Cr 2.3, history of tobacco and alcohol abuse.  He has not had a catheterization or stress test.  He was recently discharged from [facility] hospital for acute on chronic HF, diuresed almost 30 lbs.  Echo showed EF 30%, mild LVH, diffuse hypokinesis, mild MR and mod dilated LA.  No PFO.  He diuresed almost 30 lbs during hospitalization.    Last visit he continued to be dyspneic at rest and it was decided to proceed with left and right heart catheterization on 04/15/11.  Results showed RA 18, RV mean 21, PA mean 57, PCW 38 with V 50, Fick Cardiac output/index 2.9/1.5.  PVR 6.6 Woods (although felt overestimated due to MR).  Normal coronaries.  He was admitted for cardiogenic shock and placed on milrinone.  During hospitalization ABIs: Right 1.34, Left 1.18 and PFTs: FVC 64% predicted, FEV1 57% predicted, FEV1/FVC 88% predicted, FEF 25-75% 42% predicted, DLCO 61% predicted.  No significant response to bronchodilators.  Dr. Craige Cotta evaluated the patient and discontinued Spiriva and started combivent although he is awaiting aid from the [facility] clinic to afford this prescription.    He returns for post hospital follow up today.  He is feeling great, he has not felt this way in a really long time.  His baseline dyspnea has improved.  He is able to get around without having to stop and rest.  The only time he has problems is when he is trying to carry something and walk at the same time.  He denies orthopnea/PND.  No CP or syncope.  No lower extremity edema.  His right leg continues to cause him pain.  He is weighing himself daily and watching a low sodium diet.    Renal cyst noted on abdominal CT, has renal ultrasound at the end of the month and will then need renal follow up.     Past Medical History  Diagnosis Date  . Hypertension     . Systolic heart failure     EF 40-98%  . Chronic kidney disease (CKD), stage III (moderate)     baseline cr 2.3  . Hyperlipidemia   . COPD (chronic obstructive pulmonary disease)   . Peripheral vascular disease   . ETOH abuse   . Renal cyst     left, complex    Medications Prior to Admission  Medication Sig Dispense Refill  . albuterol (PROVENTIL) (5 MG/ML) 0.5% nebulizer solution Take 0.5 mLs (2.5 mg total) by nebulization every 2 (two) hours as needed for wheezing or shortness of breath.  20 mL  2  . albuterol-ipratropium (COMBIVENT) 18-103 MCG/ACT inhaler Inhale 2 puffs into the lungs every 4 (four) hours as needed for wheezing or shortness of breath (prn cough).  1 Inhaler  3  . ALPRAZolam (XANAX) 0.5 MG tablet Take 1 tablet (0.5 mg total) by mouth 3 (three) times daily as needed. For anxiety  20 tablet  0  . bisoprolol (ZEBETA) 5 MG tablet Take 1 tablet (5 mg total) by mouth daily.  30 tablet  6  . calcium carbonate (TUMS - DOSED IN MG ELEMENTAL CALCIUM) 500 MG chewable tablet Chew 1 tablet by mouth 2 (two) times daily as needed. For heartburn       . citalopram (CELEXA) 10 MG tablet Take 1 tablet (10 mg total) by mouth  daily.  30 tablet  6  . furosemide (LASIX) 80 MG tablet Take 1 tablet (80 mg total) by mouth 2 (two) times daily.  30 tablet  1  . hydrALAZINE (APRESOLINE) 25 MG tablet Take 3 tablets (75 mg total) by mouth 3 (three) times daily.  90 tablet  6  . hydrocodone-acetaminophen (LORCET-HD) 5-500 MG per capsule Take 1 capsule by mouth every 6 (six) hours as needed. For pain  30 capsule  0  . isosorbide mononitrate (IMDUR) 30 MG 24 hr tablet Take 1 tablet (30 mg total) by mouth 2 (two) times daily.  60 tablet  6   No current facility-administered medications on file as of 04/20/2011.       No Known Allergies  History   Social History  . Marital Status: Widowed    Spouse Name: N/A    Number of Children: N/A  . Years of Education: N/A   Occupational History  .  Not on file.   Social History Main Topics  . Smoking status: Former Smoker -- 1.0 packs/day for 20 years    Types: Cigarettes    Quit date: 06/10/2010  . Smokeless tobacco: Not on file  . Alcohol Use: No     quit drinking in Feb 2012  . Drug Use: No  . Sexually Active: Not on file   Other Topics Concern  . Not on file   Social History Narrative  . No narrative on file    Family History  Problem Relation Age of Onset  . Brain cancer Mother   . Pancreatic cancer Father   . Heart failure Paternal Uncle   . Heart disease Paternal Uncle   . Heart failure Paternal Grandfather   . Heart disease Paternal Grandfather     PHYSICAL EXAM: Filed Vitals:   04/20/11 1015  BP: 142/78  Pulse: 85  Wt 166   General:  Well appearing. No respiratory difficulty HEENT: normal Neck: thick, supple. JVP difficult to assess but appears 6-7. Carotids 2+ bilat; no bruits. No lymphadenopathy or thryomegaly appreciated. Cor: PMI nondisplaced. Distant regular rate & rhythm. 2/6 MR murmurs. Lungs: CTA bilaterally   Abdomen: soft, nontender, nondistended. No hepatosplenomegaly. No bruits or masses. Good bowel sounds. Extremities: no cyanosis, rash, trace edema, bilateral clubbing.  DP 2+ on right and 1+ on left  Neuro: alert & oriented x 3, cranial nerves grossly intact. moves all 4 extremities w/o difficulty. Affect pleasant.   ASSESSMENT/PLAN:

## 2011-04-21 ENCOUNTER — Other Ambulatory Visit: Payer: Self-pay | Admitting: Internal Medicine

## 2011-04-21 ENCOUNTER — Telehealth (HOSPITAL_COMMUNITY): Payer: Self-pay | Admitting: *Deleted

## 2011-04-21 DIAGNOSIS — I5023 Acute on chronic systolic (congestive) heart failure: Secondary | ICD-10-CM

## 2011-04-21 NOTE — Telephone Encounter (Signed)
Message copied by Noralee Space on Wed Apr 21, 2011  3:23 PM ------      Message from: Hadassah Pais      Created: Tue Apr 20, 2011  1:06 PM       Hold lasix for 2 days then start lasix 80/40 mg and repeat BMET next week.

## 2011-04-21 NOTE — Telephone Encounter (Signed)
Pt aware order placed for bmet 12/20 at Dr Evlyn Courier office

## 2011-04-25 NOTE — Progress Notes (Signed)
Patient seen and examined with Nicki Bradley PA-C. We discussed all aspects of the encounter. I agree with the assessment and plan as stated below.   

## 2011-04-26 ENCOUNTER — Encounter (HOSPITAL_COMMUNITY): Payer: Self-pay

## 2011-04-29 ENCOUNTER — Encounter: Payer: Self-pay | Admitting: Pulmonary Disease

## 2011-04-29 ENCOUNTER — Ambulatory Visit (INDEPENDENT_AMBULATORY_CARE_PROVIDER_SITE_OTHER): Payer: Self-pay | Admitting: Pulmonary Disease

## 2011-04-29 ENCOUNTER — Inpatient Hospital Stay: Payer: Self-pay | Admitting: Pulmonary Disease

## 2011-04-29 DIAGNOSIS — G4733 Obstructive sleep apnea (adult) (pediatric): Secondary | ICD-10-CM

## 2011-04-29 DIAGNOSIS — J449 Chronic obstructive pulmonary disease, unspecified: Secondary | ICD-10-CM

## 2011-04-29 NOTE — Assessment & Plan Note (Signed)
He has trouble affording his medications.  He is applying for medicare.  I have give him a sample of proair.  He is to continue albuterol nebulizer.

## 2011-04-29 NOTE — Patient Instructions (Signed)
Will arrange for sleep study Will call to schedule follow up after sleep study reviewed 

## 2011-04-29 NOTE — Progress Notes (Signed)
Chief Complaint  Patient presents with  . HFU    Pt states his breathing has been fine. Pt c/o cough w/ clear phlem, wheezing, vomiting, diarrhea off and on x 1 week. Pt states he has never had sleep study.    History of Present Illness: [name] is a 50 y.o. male former smoker with COPD, OSA, systolic CHF.  He is here for hospital follow up.  He has been doing better with his breathing.  He is not able to afford combivent.  His medicaid hearing is in two weeks.  He has been using albuterol nebulizer twice per day and this helps.  He started getting a cough yesterday.  He denies sore throat, chest pain, fever, or sputum.  He has some sinus congestion, but not too bad.  He does snore.  He was told by the nurses in the hospital that he stops breathing while asleep.  He feels sleepy all the time, and can never get a good night sleep.  His Epworth score is 8 out of 24.  Past Medical History  Diagnosis Date  . Hypertension   . Systolic heart failure     EF 04-54%  . Chronic kidney disease (CKD), stage III (moderate)     baseline cr 2.3  . Hyperlipidemia   . COPD (chronic obstructive pulmonary disease)   . Peripheral vascular disease   . ETOH abuse   . Renal cyst     left, complex    No past surgical history on file.  No Known Allergies  Physical Exam:  Blood pressure 152/88, pulse 84, temperature 98.3 F (36.8 C), temperature source Oral, height 5\' 8"  (1.727 m), weight 171 lb 6.4 oz (77.747 kg), SpO2 98.00%. Body mass index is 26.06 kg/(m^2). Wt Readings from Last 2 Encounters:  04/29/11 171 lb 6.4 oz (77.747 kg)  04/20/11 166 lb 8 oz (75.524 kg)   General - obese, no distress  HEENT - no sinus tenderness, no LAN  Cardiac - s1s2 regular, no murmur  Chest - prolonged exhalation, no wheeze/rales  Abd - obese, soft, nontender  Ext - no edema  Neuro - normal strength  Psych - normal mood, behavior  PFT 11/30>>FEV1 2.10(57%), FEV1% 69, TLC 7.37(112%), DLCO 61%, no  BD response   CT chest w/o contrast 04/12/11>>basilar scarring  Assessment/Plan:  Outpatient Encounter Prescriptions as of 04/29/2011  Medication Sig Dispense Refill  . albuterol (PROVENTIL) (5 MG/ML) 0.5% nebulizer solution Take 0.5 mLs (2.5 mg total) by nebulization every 2 (two) hours as needed for wheezing or shortness of breath.  20 mL  2  . albuterol-ipratropium (COMBIVENT) 18-103 MCG/ACT inhaler Inhale 2 puffs into the lungs every 4 (four) hours as needed for wheezing or shortness of breath (prn cough).  1 Inhaler  3  . ALPRAZolam (XANAX) 0.5 MG tablet Take 1 tablet (0.5 mg total) by mouth 3 (three) times daily as needed. For anxiety  20 tablet  0  . bisoprolol (ZEBETA) 5 MG tablet Take 1.5 tablets (7.5 mg total) by mouth daily.  45 tablet  3  . calcium carbonate (TUMS - DOSED IN MG ELEMENTAL CALCIUM) 500 MG chewable tablet Chew 1 tablet by mouth 2 (two) times daily as needed. For heartburn       . citalopram (CELEXA) 10 MG tablet Take 1 tablet (10 mg total) by mouth daily.  30 tablet  6  . furosemide (LASIX) 80 MG tablet Take 1 tablet (80 mg total) by mouth 2 (two) times daily.  30 tablet  1  . hydrALAZINE (APRESOLINE) 25 MG tablet Take 3 tablets (75 mg total) by mouth 3 (three) times daily.  90 tablet  6  . hydrocodone-acetaminophen (LORCET-HD) 5-500 MG per capsule Take 1 capsule by mouth every 6 (six) hours as needed. For pain  30 capsule  0  . isosorbide mononitrate (IMDUR) 30 MG 24 hr tablet Take 1 tablet (30 mg total) by mouth 2 (two) times daily.  60 tablet  6    [name] Pager:  [phone] 04/29/2011, 1:35 PM

## 2011-04-29 NOTE — Assessment & Plan Note (Signed)
He has snoring, sleep disruption, and daytime sleepiness.  He has history of cardiovascular disease.  I am concerned he could have sleep apnea.  To further assess will arrange for in lab sleep study.

## 2011-04-30 ENCOUNTER — Inpatient Hospital Stay: Payer: Self-pay | Admitting: Internal Medicine

## 2011-05-10 ENCOUNTER — Encounter: Payer: Self-pay | Admitting: Cardiology

## 2011-05-21 ENCOUNTER — Encounter (HOSPITAL_COMMUNITY): Payer: Self-pay

## 2011-05-26 ENCOUNTER — Inpatient Hospital Stay: Payer: Self-pay | Admitting: Pulmonary Disease

## 2011-05-31 ENCOUNTER — Ambulatory Visit (HOSPITAL_BASED_OUTPATIENT_CLINIC_OR_DEPARTMENT_OTHER): Payer: Self-pay

## 2011-06-02 ENCOUNTER — Encounter (HOSPITAL_COMMUNITY): Payer: Self-pay

## 2011-06-04 ENCOUNTER — Encounter (HOSPITAL_COMMUNITY): Payer: Self-pay

## 2011-06-22 ENCOUNTER — Ambulatory Visit (HOSPITAL_COMMUNITY)
Admission: RE | Admit: 2011-06-22 | Discharge: 2011-06-22 | Disposition: A | Discharge status: Home / Self Care | Payer: Medicaid Other | Source: Ambulatory Visit | Attending: Internal Medicine | Admitting: Internal Medicine

## 2011-06-22 VITALS — BP 156/80 | HR 80 | Wt 171.5 lb

## 2011-06-22 DIAGNOSIS — I5022 Chronic systolic (congestive) heart failure: Secondary | ICD-10-CM

## 2011-06-22 MED ORDER — ALBUTEROL SULFATE (5 MG/ML) 0.5% IN NEBU: 2.5000 mg | INHALATION_SOLUTION | RESPIRATORY_TRACT | Status: DC | PRN

## 2011-06-22 MED ORDER — CITALOPRAM HYDROBROMIDE 10 MG PO TABS: 10.0000 mg | ORAL_TABLET | Freq: Every day | ORAL | Status: DC

## 2011-06-22 MED ORDER — HYDRALAZINE HCL 25 MG PO TABS: 75.0000 mg | ORAL_TABLET | Freq: Three times a day (TID) | ORAL | Status: DC

## 2011-06-22 MED ORDER — FUROSEMIDE 80 MG PO TABS: 80.0000 mg | ORAL_TABLET | ORAL | Status: DC | PRN

## 2011-06-22 MED ORDER — ISOSORBIDE MONONITRATE ER 30 MG PO TB24: 30.0000 mg | ORAL_TABLET | Freq: Two times a day (BID) | ORAL | Status: DC

## 2011-06-22 MED ORDER — BISOPROLOL FUMARATE 5 MG PO TABS: 7.5000 mg | ORAL_TABLET | Freq: Every day | ORAL | Status: DC

## 2011-06-22 NOTE — Assessment & Plan Note (Signed)
Stable.  Followed by Dr. Caryn Section.

## 2011-06-22 NOTE — Progress Notes (Signed)
Referring Physician: Dr. Eda Paschal Primary Physician: [facility] in [place]    HPI:  [name] is a 51 y.o. gentlemen with history of COPD, systolic HF with EF 30%, CRI with baseline Cr 2.3, history of tobacco and alcohol abuse.  He has not had a catheterization or stress test.  He was recently discharged from [facility] hospital for acute on chronic HF, diuresed almost 30 lbs.  Echo showed EF 30%, mild LVH, diffuse hypokinesis, mild MR and mod dilated LA.  No PFO.  He diuresed almost 30 lbs during hospitalization.    Last visit he continued to be dyspneic at rest and it was decided to proceed with left and right heart catheterization on 04/15/11.  Results showed RA 18, RV mean 21, PA mean 57, PCW 38 with V 50, Fick Cardiac output/index 2.9/1.5.  PVR 6.6 Woods (although felt overestimated due to MR).  Normal coronaries.  He was admitted for cardiogenic shock and placed on milrinone.  During hospitalization ABIs: Right 1.34, Left 1.18 and PFTs: FVC 64% predicted, FEV1 57% predicted, FEV1/FVC 88% predicted, FEF 25-75% 42% predicted, DLCO 61% predicted.  No significant response to bronchodilators.  Dr. Craige Cotta evaluated the patient and discontinued Spiriva and started combivent although he is awaiting aid from the [facility] clinic to afford this prescription.    Returns for routine follow up today.  He feels ok today, has increased fatigue.  Daughter states she noticed increased dyspnea over the last week.  Had the flu several weeks ago and feels like he is still getting over the fatigue.  He is undergoing a sleep study 2/27 now that he has insurance.  He is weighing daily, weight staying 164-165, he is only taking his lasix once the weight is around 170.   Saw Dr. Caryn Section yesterday with labs, will follow up with him in 2 months for further discussion of HD.  Denies orthopnea/PND/CP.  Dyspnea at baseline.  Weighing daily and following low sodium diet.  Was running out of his hydralazine so has been taking 50 mg TID instead of  75 mg TID.  +cough/HA     06/15/11: Cr 3.2, BUN 2  Ambulated in halls O2 sat 94-95%  ROS: All pertinent positive/negatives in HPI otherwise negative    Past Medical History  Diagnosis Date  . Hypertension   . Systolic heart failure     EF 16-10%  . Chronic kidney disease (CKD), stage III (moderate)     baseline cr 2.3  . Hyperlipidemia   . COPD (chronic obstructive pulmonary disease)   . Peripheral vascular disease   . ETOH abuse   . Renal cyst     left, complex    Medications Prior to Admission  Medication Sig Dispense Refill  . albuterol (PROVENTIL) (5 MG/ML) 0.5% nebulizer solution Take 0.5 mLs (2.5 mg total) by nebulization every 2 (two) hours as needed for wheezing or shortness of breath.  20 mL  2  . albuterol-ipratropium (COMBIVENT) 18-103 MCG/ACT inhaler Inhale 2 puffs into the lungs every 4 (four) hours as needed for wheezing or shortness of breath (prn cough).  1 Inhaler  3  . ALPRAZolam (XANAX) 0.5 MG tablet Take 1 tablet (0.5 mg total) by mouth 3 (three) times daily as needed. For anxiety  20 tablet  0  . bisoprolol (ZEBETA) 5 MG tablet Take 1.5 tablets (7.5 mg total) by mouth daily.  45 tablet  3  . calcium carbonate (TUMS - DOSED IN MG ELEMENTAL CALCIUM) 500 MG chewable tablet Chew 1  tablet by mouth 2 (two) times daily as needed. For heartburn       . citalopram (CELEXA) 10 MG tablet Take 1 tablet (10 mg total) by mouth daily.  30 tablet  6  . furosemide (LASIX) 80 MG tablet Take 1 tablet (80 mg total) by mouth 2 (two) times daily.  30 tablet  1  . hydrALAZINE (APRESOLINE) 25 MG tablet Take 3 tablets (75 mg total) by mouth 3 (three) times daily.  90 tablet  6  . hydrocodone-acetaminophen (LORCET-HD) 5-500 MG per capsule Take 1 capsule by mouth every 6 (six) hours as needed. For pain  30 capsule  0  . isosorbide mononitrate (IMDUR) 30 MG 24 hr tablet Take 1 tablet (30 mg total) by mouth 2 (two) times daily.  60 tablet  6   No current facility-administered medications on  file as of 06/22/2011.    No Known Allergies   PHYSICAL EXAM: Filed Vitals:   06/22/11 1004  BP: 156/80  Pulse: 80  Weight: 171 lb 8 oz (77.792 kg)  SpO2: 97%    General:  Well appearing. No respiratory difficulty HEENT: normal Neck: thick, supple. JVP difficult to assess but appears 6-7. Carotids 2+ bilat; no bruits. No lymphadenopathy or thryomegaly appreciated. Cor: PMI nondisplaced. Distant regular rate & rhythm. 2/6 MR murmurs. Lungs: CTA bilaterally   Abdomen: soft, nontender, nondistended. No hepatosplenomegaly. No bruits or masses. Good bowel sounds. Extremities: no cyanosis, rash, trace edema, bilateral clubbing.  DP 2+ on right and 1+ on left  Neuro: alert & oriented x 3, cranial nerves grossly intact. moves all 4 extremities w/o difficulty. Affect pleasant.   ASSESSMENT/PLAN:

## 2011-06-22 NOTE — Assessment & Plan Note (Signed)
Followed by Dr. Craige Cotta.  Sleep apnea study on 2/27.  No home O2 right now, O2 sats above 94% with ambulation.

## 2011-06-22 NOTE — Assessment & Plan Note (Addendum)
NYHA III.  Volume status looks good today.  Will increase hydralazine back to 75 mg TID.  Will use lasix 80 mg prn for weight greater than 168.  Will continue to follow closely and titrate as tolerated.  Congratulated him on daily weights and low sodium diet.    Patient seen and examined with Ulyess Blossom PA-C. We discussed all aspects of the encounter. I agree with the assessment and plan as stated above.  He looks good. Reinforced need for daily weights and reviewed use of sliding scale diuretics. We discussed the importance of complying with his medical regimen to avoid progression of his renal disease as he may not be able to tolerate dialysis with his HF.

## 2011-06-22 NOTE — Patient Instructions (Addendum)
Will send in refills today.    Take lasix 80 mg if weight goes up above 168 pounds until weight back down to 165.    Follow up 4 weeks.

## 2011-06-24 ENCOUNTER — Telehealth (HOSPITAL_COMMUNITY): Payer: Self-pay | Admitting: *Deleted

## 2011-06-24 MED ORDER — HYDRALAZINE HCL 25 MG PO TABS: 75.0000 mg | ORAL_TABLET | Freq: Three times a day (TID) | ORAL | Status: DC

## 2011-06-24 MED ORDER — FUROSEMIDE 80 MG PO TABS: 80.0000 mg | ORAL_TABLET | ORAL | Status: DC | PRN

## 2011-06-24 MED ORDER — CITALOPRAM HYDROBROMIDE 10 MG PO TABS: 10.0000 mg | ORAL_TABLET | Freq: Every day | ORAL | Status: DC

## 2011-06-24 MED ORDER — ISOSORBIDE MONONITRATE ER 30 MG PO TB24: 30.0000 mg | ORAL_TABLET | Freq: Two times a day (BID) | ORAL | Status: DC

## 2011-06-24 MED ORDER — BISOPROLOL FUMARATE 5 MG PO TABS: 7.5000 mg | ORAL_TABLET | Freq: Every day | ORAL | Status: DC

## 2011-06-24 NOTE — Telephone Encounter (Signed)
Okey Regal called today for her father.  She was here for his last visit and was told by [facility] that we would be calling in refills for her fathers medicine, and they have not been called yet.  Please follow up.  Thanks.

## 2011-06-25 NOTE — Telephone Encounter (Signed)
Spoke w/Carol yesterday she is aware prescriptions were sent in, she ask about enough refills on Xanax and Hydrocodone to last until next Friday when he is scheduled to see his new pcp Dr Nedra Hai, discussed w/Dr [name] he states he is not able to provide these prescriptions Okey Regal is aware

## 2011-06-29 ENCOUNTER — Encounter: Payer: Self-pay | Admitting: Internal Medicine

## 2011-07-07 ENCOUNTER — Ambulatory Visit (HOSPITAL_BASED_OUTPATIENT_CLINIC_OR_DEPARTMENT_OTHER): Payer: Medicaid Other | Attending: Pulmonary Disease | Admitting: Radiology

## 2011-07-07 VITALS — Ht 65.0 in | Wt 163.0 lb

## 2011-07-07 DIAGNOSIS — J4489 Other specified chronic obstructive pulmonary disease: Secondary | ICD-10-CM | POA: Insufficient documentation

## 2011-07-07 DIAGNOSIS — G4733 Obstructive sleep apnea (adult) (pediatric): Secondary | ICD-10-CM | POA: Insufficient documentation

## 2011-07-07 DIAGNOSIS — Z79899 Other long term (current) drug therapy: Secondary | ICD-10-CM | POA: Insufficient documentation

## 2011-07-07 DIAGNOSIS — J449 Chronic obstructive pulmonary disease, unspecified: Secondary | ICD-10-CM | POA: Insufficient documentation

## 2011-07-07 DIAGNOSIS — I10 Essential (primary) hypertension: Secondary | ICD-10-CM | POA: Insufficient documentation

## 2011-07-14 NOTE — Procedures (Signed)
NAME:  [name], [name] NO.:  [id number]  MEDICAL RECORD NO.:  [id number]          PATIENT TYPE:  OUT  LOCATION:  SLEEP CENTER                 FACILITY:  [facility]  PHYSICIAN:  Coralyn Helling, MD        DATE OF BIRTH:  07-Dec-1960  DATE OF STUDY:  07/07/2011                           NOCTURNAL POLYSOMNOGRAM  REFERRING PHYSICIAN:  Coralyn Helling, MD  FACILITY:  [facility]  REFERRING PHYSICIAN:  Coralyn Helling, MD  INDICATION:  Mr. Swetz is a 51 year old male who has a history of hypertension and COPD.  He also has symptoms of snoring, sleep disruption, and daytime sleepiness.  He is referred to the sleep lab for evaluation of hypersomnia with obstructive sleep apnea.  Height is 5 feet 5 inches, weight is 163 pounds.  BMI is 27, neck size is 16.5 inches.  MEDICATIONS:  Albuterol, Combivent, Xanax, Zebeta, Tums, Celexa, Lasix, hydralazine, hydrocodone, acetaminophen, and Imdur.  EPWORTH SLEEPINESS SCORE:  6.  SLEEP ARCHITECTURE:  The patient followed a split night study protocol. During the diagnostic portion of the study, total recording time was 208 minutes.  Total sleep time was 132 minutes.  Sleep efficiency was 63%. Sleep latency was 25 minutes.  This portion of the study was notable for lack of slow-wave sleep and REM sleep.  He slept in both the supine and nonsupine positions.  During the titration portion of study, total recording time was 175 minutes.  Total sleep time was 148 minutes.  Sleep efficiency was 84%. Sleep latency was 12 minutes.  REM latency was 10 minutes.  The patient was observed in all stages of sleep and slept in the nonsupine position.  RESPIRATORY DATA:  The average respiratory rate was 16.  Moderate snoring was noted by the technician.  The overall apnea/hypopnea index was 38.2.  The events were exclusively obstructive in nature.  The supine apnea/hypopnea index was 36.5.  The nonsupine apnea/hypopnea index is 40.8.  During the  titration portion of study, the patient was started on CPAP of 5 and increased to 11 cm of water.  With CPAP set at 7 cm of water, the apnea/hypopnea index was reduced to 2.1.  At this pressure setting, he was observed in REM sleep.  OXYGEN DATA:  The baseline oxygenation was 96%.  The oxygen saturation nadir was 87%.  The study was conducted without the use of supplemental oxygen.  CARDIAC DATA:  The average heart rate is 86 and the rhythm strip showed sinus rhythm with occasional PVCs and PACs.  MOVEMENT/PARASOMNIA:  The patient had no restroom trips.  The periodic limb movement index was 7.3 during the diagnostic portion of study but 0 during the titration portion of study.  IMPRESSION:  This study shows evidence for severe obstructive sleep apnea with an apnea/hypopnea index of 38.2 and oxygen saturation nadir of 87%.  He had very good control of his sleep-disordered breathing with CPAP set at 7 cm of water.  In addition to diet, exercise, weight reduction, I would recommend that the patient be started on CPAP at 7 cm of water and monitored for his clinical response.     Coralyn Helling, MD Diplomat, American  Board of Sleep Medicine    VS/MEDQ  D:  07/13/2011 11:25:23  T:  07/14/2011 02:51:35  Job:  [id number]

## 2011-07-16 ENCOUNTER — Telehealth: Payer: Self-pay | Admitting: Pulmonary Disease

## 2011-07-16 DIAGNOSIS — G4733 Obstructive sleep apnea (adult) (pediatric): Secondary | ICD-10-CM

## 2011-07-16 DIAGNOSIS — G4761 Periodic limb movement disorder: Secondary | ICD-10-CM

## 2011-07-16 NOTE — Telephone Encounter (Signed)
PSG 07/07/11>>AHI 38.2, SpO2 low 87%.  CPAP 7 cm H2O>>AHI 2.1.  Discussed with patient.    Will proceed with setting up CPAP 7 cm H2O.    Will have my nurse schedule ROV 2 months after CPAP set up.

## 2011-07-19 ENCOUNTER — Encounter (HOSPITAL_COMMUNITY): Payer: Self-pay

## 2011-07-19 ENCOUNTER — Ambulatory Visit (HOSPITAL_COMMUNITY)
Admission: RE | Admit: 2011-07-19 | Discharge: 2011-07-19 | Disposition: A | Discharge status: Home / Self Care | Payer: Medicaid Other | Source: Ambulatory Visit | Attending: Internal Medicine | Admitting: Internal Medicine

## 2011-07-19 VITALS — BP 162/86 | HR 82 | Ht 66.0 in | Wt 168.4 lb

## 2011-07-19 DIAGNOSIS — J4489 Other specified chronic obstructive pulmonary disease: Secondary | ICD-10-CM | POA: Insufficient documentation

## 2011-07-19 DIAGNOSIS — E785 Hyperlipidemia, unspecified: Secondary | ICD-10-CM | POA: Insufficient documentation

## 2011-07-19 DIAGNOSIS — I739 Peripheral vascular disease, unspecified: Secondary | ICD-10-CM | POA: Insufficient documentation

## 2011-07-19 DIAGNOSIS — F1010 Alcohol abuse, uncomplicated: Secondary | ICD-10-CM | POA: Insufficient documentation

## 2011-07-19 DIAGNOSIS — Q619 Cystic kidney disease, unspecified: Secondary | ICD-10-CM | POA: Insufficient documentation

## 2011-07-19 DIAGNOSIS — F17200 Nicotine dependence, unspecified, uncomplicated: Secondary | ICD-10-CM | POA: Insufficient documentation

## 2011-07-19 DIAGNOSIS — G4733 Obstructive sleep apnea (adult) (pediatric): Secondary | ICD-10-CM | POA: Insufficient documentation

## 2011-07-19 DIAGNOSIS — I129 Hypertensive chronic kidney disease with stage 1 through stage 4 chronic kidney disease, or unspecified chronic kidney disease: Secondary | ICD-10-CM | POA: Insufficient documentation

## 2011-07-19 DIAGNOSIS — Z79899 Other long term (current) drug therapy: Secondary | ICD-10-CM | POA: Insufficient documentation

## 2011-07-19 DIAGNOSIS — I5022 Chronic systolic (congestive) heart failure: Secondary | ICD-10-CM | POA: Insufficient documentation

## 2011-07-19 DIAGNOSIS — J449 Chronic obstructive pulmonary disease, unspecified: Secondary | ICD-10-CM | POA: Insufficient documentation

## 2011-07-19 DIAGNOSIS — I509 Heart failure, unspecified: Secondary | ICD-10-CM | POA: Insufficient documentation

## 2011-07-19 DIAGNOSIS — I10 Essential (primary) hypertension: Secondary | ICD-10-CM

## 2011-07-19 DIAGNOSIS — R6889 Other general symptoms and signs: Secondary | ICD-10-CM | POA: Insufficient documentation

## 2011-07-19 DIAGNOSIS — N183 Chronic kidney disease, stage 3 unspecified: Secondary | ICD-10-CM | POA: Insufficient documentation

## 2011-07-19 DIAGNOSIS — I059 Rheumatic mitral valve disease, unspecified: Secondary | ICD-10-CM | POA: Insufficient documentation

## 2011-07-19 NOTE — Progress Notes (Signed)
Referring Physician: Dr. Eda Paschal Primary Physician: [facility] in [place]    HPI:  [name] is a 51 y.o. gentlemen with history of COPD, systolic HF with EF 30%, CRI with baseline Cr 2.3, history of tobacco and alcohol abuse.  He has not had a catheterization or stress test.  He was recently discharged from [facility] hospital for acute on chronic HF, diuresed almost 30 lbs.  Echo showed EF 30%, mild LVH, diffuse hypokinesis, mild MR and mod dilated LA.  No PFO.  He diuresed almost 30 lbs during hospitalization.    Last visit he continued to be dyspneic at rest and it was decided to proceed with left and right heart catheterization on 04/08/11.  Results showed RA 18, RV mean 21, PA mean 57, PCW 38 with V 50, Fick Cardiac output/index 2.9/1.5.  PVR 6.6 Woods (although felt overestimated due to MR).  Normal coronaries.  He was admitted for cardiogenic shock and placed on milrinone.  During hospitalization ABIs: Right 1.34, Left 1.18 and PFTs: FVC 64% predicted, FEV1 57% predicted, FEV1/FVC 88% predicted, FEF 25-75% 42% predicted, DLCO 61% predicted.  No significant response to bronchodilators.  Dr. Craige Cotta evaluated the patient and discontinued Spiriva and started combivent although he is awaiting aid from the [facility] clinic to afford this prescription.  D/c weight was 167 pounds.  Returns for routine follow up today.  Recently had sleep study which showed severe OSA with AHI 38. Starting CPAP.  He feels ok today. Very fatigued.He is weighing daily, weight staying 164-165, he is only taking his lasix once the weight is around 167.  Taking bisoprolol 7.5 daily. Reportedly taking hydralazine 75 tid but says he is only taking 1 pill at a time. Still smoking and drinking.   Following with Dr. Caryn Section. Last Cr 12/12 was 3.7.    ROS: All pertinent positive/negatives in HPI otherwise negative    Past Medical History  Diagnosis Date  . Hypertension   . Systolic heart failure     EF 40-98%  . Chronic kidney disease (CKD),  stage III (moderate)     baseline cr 2.3  . Hyperlipidemia   . COPD (chronic obstructive pulmonary disease)   . Peripheral vascular disease   . ETOH abuse   . Renal cyst     left, complex    Medications Prior to Admission  Medication Sig Dispense Refill  . albuterol (PROVENTIL) (5 MG/ML) 0.5% nebulizer solution Take 0.5 mLs (2.5 mg total) by nebulization every 2 (two) hours as needed for wheezing or shortness of breath.  20 mL  2  . bisoprolol (ZEBETA) 5 MG tablet Take 1.5 tablets (7.5 mg total) by mouth daily.  45 tablet  6  . calcium carbonate (TUMS - DOSED IN MG ELEMENTAL CALCIUM) 500 MG chewable tablet Chew 1 tablet by mouth 2 (two) times daily as needed. For heartburn       . citalopram (CELEXA) 10 MG tablet Take 1 tablet (10 mg total) by mouth daily.  10 tablet  0  . furosemide (LASIX) 80 MG tablet Take 1 tablet (80 mg total) by mouth as needed ( for weight greater than 168).  30 tablet  6  . hydrALAZINE (APRESOLINE) 25 MG tablet Take 3 tablets (75 mg total) by mouth 3 (three) times daily.  90 tablet  6  . isosorbide mononitrate (IMDUR) 30 MG 24 hr tablet Take 1 tablet (30 mg total) by mouth 2 (two) times daily.  60 tablet  6  . albuterol-ipratropium (COMBIVENT) 18-103 MCG/ACT  inhaler Inhale 2 puffs into the lungs every 4 (four) hours as needed for wheezing or shortness of breath (prn cough).  1 Inhaler  3  . ALPRAZolam (XANAX) 0.5 MG tablet Take 1 tablet (0.5 mg total) by mouth 3 (three) times daily as needed. For anxiety  20 tablet  0  . hydrocodone-acetaminophen (LORCET-HD) 5-500 MG per capsule Take 1 capsule by mouth every 6 (six) hours as needed. For pain  30 capsule  0   No current facility-administered medications on file as of 07/19/2011.    No Known Allergies   PHYSICAL EXAM: Filed Vitals:   07/19/11 1145  BP: 162/86  Pulse: 82  Height: 5\' 6"  (1.676 m)  Weight: 168 lb 6.4 oz (76.386 kg)  SpO2: 96%    General:  Ambulates slowly. No respiratory difficulty HEENT:  normal Neck: thick, supple. JVP difficult to assess but appears 6-7. Carotids 2+ bilat; no bruits. No lymphadenopathy or thryomegaly appreciated. Cor: PMI nondisplaced. Distant regular rate & rhythm. 2/6 MR murmurs. Lungs: CTA bilaterally   Abdomen: soft, nontender, nondistended. No hepatosplenomegaly. No bruits or masses. Good bowel sounds. Extremities: no cyanosis, rash, trace edema, bilateral clubbing.  DP 2+ on right and 1+ on left  Neuro: alert & oriented x 3, cranial nerves grossly intact. moves all 4 extremities w/o difficulty. Affect pleasant.   ASSESSMENT/PLAN:

## 2011-07-19 NOTE — Patient Instructions (Signed)
The current medical regimen is effective;  continue present plan and medications.   Follow up in 1 month with Dr [name].

## 2011-07-19 NOTE — Assessment & Plan Note (Addendum)
NYHA III-IIIB due to a combination of his HF and COPD. He seems to have significant confusion about his medications and I am not sure he is taking his hydralazine correctly. I have reviewed at length with him and his daughter and I have also asked our pharmacist, Raynelle Fanning Copper, to provide education and pill boxes. Volume status looks OK. Will continue current lasix regimen. Will need to re-evaluate EF to decided on ICD.

## 2011-07-19 NOTE — Assessment & Plan Note (Signed)
BP up. But not sure if he is taking hydralazine as directed. Will f/u in 1 month and see what it looks like.

## 2011-07-19 NOTE — Assessment & Plan Note (Signed)
Will start CPAP soon.

## 2011-07-21 NOTE — Telephone Encounter (Signed)
I spoke with pt and is scheduled to come in 09/15/10 at 1:30 for follow up

## 2011-07-30 ENCOUNTER — Telehealth: Payer: Self-pay | Admitting: Pulmonary Disease

## 2011-07-30 MED ORDER — ALBUTEROL SULFATE HFA 108 (90 BASE) MCG/ACT IN AERS: 2.0000 | INHALATION_SPRAY | Freq: Four times a day (QID) | RESPIRATORY_TRACT | Status: DC | PRN

## 2011-07-30 NOTE — Telephone Encounter (Signed)
Contacted patient, patient states that he received a sample of the proair inhaler and would like a prescription sent in but it is not on his med list. Please advise. Thanks

## 2011-07-30 NOTE — Telephone Encounter (Signed)
Okay to send script for proair hfa two puffs up to four times per day as needed for cough, wheezing, or chest congestion.  Dispense one inhaler with 5 refills.

## 2011-07-30 NOTE — Telephone Encounter (Signed)
Prescription for Proair went to [facility] in [place]. Was not able to reach the pt or LM so I called pt's daughter and and she will make sure pt knows prescription was sent.

## 2011-08-23 ENCOUNTER — Ambulatory Visit (HOSPITAL_COMMUNITY)
Admission: RE | Admit: 2011-08-23 | Discharge: 2011-08-23 | Disposition: A | Discharge status: Home / Self Care | Payer: Medicaid Other | Source: Ambulatory Visit | Attending: Internal Medicine | Admitting: Internal Medicine

## 2011-08-23 VITALS — BP 160/78 | HR 75 | Wt 167.2 lb

## 2011-08-23 DIAGNOSIS — I10 Essential (primary) hypertension: Secondary | ICD-10-CM | POA: Insufficient documentation

## 2011-08-23 DIAGNOSIS — I5022 Chronic systolic (congestive) heart failure: Secondary | ICD-10-CM | POA: Insufficient documentation

## 2011-08-23 DIAGNOSIS — G4733 Obstructive sleep apnea (adult) (pediatric): Secondary | ICD-10-CM

## 2011-08-23 MED ORDER — HYDRALAZINE HCL 100 MG PO TABS: 100.0000 mg | ORAL_TABLET | Freq: Three times a day (TID) | ORAL | Status: DC

## 2011-08-23 NOTE — Progress Notes (Addendum)
Patient ID: [name], male   DOB: 1961/02/07, 51 y.o.   MRN: [id number] Referring Physician: Dr Eda Paschal Primary Physician: Dr Orvan Falconer Nephoroligist: Dr Caryn Section   HPI: [name] is a 51 y.o. gentlemen with history of COPD, systolic HF with EF 30%, CRI with baseline Cr 2.3, history of tobacco, OSA (CPAP),  and alcohol abuse.  He has not had a catheterization or stress test.   Echo showed EF 30%, mild LVH, diffuse hypokinesis, mild MR and mod dilated LA.  No PFO.     L/RHCtheterization on 04/08/11.   RA 18, RV mean 21, PA mean 57, PCW 38 with V 50, Fick Cardiac output/index 2.9/1.5.  PVR 6.6 Woods (although felt overestimated due to MR).  Normal coronaries.  He was admitted for cardiogenic shock and placed on milrinone.  During hospitalization ABIs: Right 1.34, Left 1.18 and PFTs: FVC 64% predicted, FEV1 57% predicted, FEV1/FVC 88% predicted, FEF 25-75% 42% predicted, DLCO 61% predicted.  No significant response to bronchodilators.  Dr. Craige Cotta evaluated the patient and discontinued Spiriva and started combivent although he is awaiting aid from the [facility] clinic to afford this prescription.  D/c weight was 167 pounds.  He is here for follow up. Denies SOB and orthopnea. He is walking 200 yards daily. He is taking Xanax at night and he is able to tolerate CPAP.  Weight at home 161-164. He is only taking Lasix if his weight 165 pounds or greater. He only requires Lasix 80 mg once a month.   He is complaint with medications because he sets up pill box on Sundays. Stopped smoking and drinking alcohol.     ROS: All pertinent positive/negatives in HPI otherwise negative    Past Medical History  Diagnosis Date  . Hypertension   . Systolic heart failure     EF 40-98%  . Chronic kidney disease (CKD), stage III (moderate)     baseline cr 2.3  . Hyperlipidemia   . COPD (chronic obstructive pulmonary disease)   . Peripheral vascular disease   . ETOH abuse   . Renal cyst     left, complex    Medications Prior  to Admission  Medication Sig Dispense Refill  . albuterol (PROAIR HFA) 108 (90 BASE) MCG/ACT inhaler Inhale 2 puffs into the lungs every 6 (six) hours as needed for wheezing.  1 Inhaler  5  . albuterol (PROVENTIL) (5 MG/ML) 0.5% nebulizer solution Take 0.5 mLs (2.5 mg total) by nebulization every 2 (two) hours as needed for wheezing or shortness of breath.  20 mL  2  . albuterol-ipratropium (COMBIVENT) 18-103 MCG/ACT inhaler Inhale 2 puffs into the lungs every 4 (four) hours as needed for wheezing or shortness of breath (prn cough).  1 Inhaler  3  . ALPRAZolam (XANAX) 0.5 MG tablet Take 1 tablet (0.5 mg total) by mouth 3 (three) times daily as needed. For anxiety  20 tablet  0  . bisoprolol (ZEBETA) 5 MG tablet Take 1.5 tablets (7.5 mg total) by mouth daily.  45 tablet  6  . calcium carbonate (TUMS - DOSED IN MG ELEMENTAL CALCIUM) 500 MG chewable tablet Chew 1 tablet by mouth 2 (two) times daily as needed. For heartburn       . citalopram (CELEXA) 10 MG tablet Take 1 tablet (10 mg total) by mouth daily.  10 tablet  0  . furosemide (LASIX) 80 MG tablet Take 1 tablet (80 mg total) by mouth as needed ( for weight greater than 168).  30 tablet  6  . gabapentin (NEURONTIN) 100 MG capsule Take 100 mg by mouth 3 (three) times daily.      . hydrALAZINE (APRESOLINE) 25 MG tablet Take 3 tablets (75 mg total) by mouth 3 (three) times daily.  90 tablet  6  . hydrocodone-acetaminophen (LORCET-HD) 5-500 MG per capsule Take 1 capsule by mouth every 6 (six) hours as needed. For pain  30 capsule  0  . isosorbide mononitrate (IMDUR) 30 MG 24 hr tablet Take 1 tablet (30 mg total) by mouth 2 (two) times daily.  60 tablet  6   No current facility-administered medications on file as of 08/23/2011.    No Known Allergies   PHYSICAL EXAM: Filed Vitals:   08/23/11 1344  BP: 160/78  Pulse: 75  Weight: 167 lb 4 oz (75.864 kg)  SpO2: 93%    General:  Ambulates slowly. No respiratory difficulty HEENT: normal Neck:  thick, supple. JVP difficult to assess but appears flat. Carotids 2+ bilat; no bruits. No lymphadenopathy or thryomegaly appreciated. Cor: PMI nondisplaced. Distant regular rate & rhythm. 2/6 MR murmurs. Lungs: CTA bilaterally   Abdomen: soft, nontender, nondistended. No hepatosplenomegaly. No bruits or masses. Good bowel sounds. Extremities: no cyanosis, rash, trace edema, bilateral clubbing.   Neuro: alert & oriented x 3, cranial nerves grossly intact. moves all 4 extremities w/o difficulty. Affect pleasant.   ASSESSMENT/PLAN:

## 2011-08-23 NOTE — Patient Instructions (Signed)
Take Hydralazine 100 mg three times a day  Follow up in one week with NP or PA  Do the following things EVERYDAY: 1) Weigh yourself in the morning before breakfast. Write it down and keep it in a log. 2) Take your medicines as prescribed 3) Eat low salt foods--Limit salt (sodium) to 2000mg  per day.  4) Stay as active as you can everyday

## 2011-08-23 NOTE — Assessment & Plan Note (Signed)
He has been compliant with all medications over the last month. SBP >130. Will increase Hydralazine 100 mg tid and follow up in one month.

## 2011-08-23 NOTE — Assessment & Plan Note (Addendum)
NYHA III. Volume status stable. Continue current medical regimen. Reinforced daily weights and to take Lasix if his weight is 165 pounds or greater.

## 2011-08-23 NOTE — Assessment & Plan Note (Signed)
Congratulated him CPAP compliance. Continue CPAP.

## 2011-08-30 ENCOUNTER — Telehealth (HOSPITAL_COMMUNITY): Payer: Self-pay | Admitting: *Deleted

## 2011-08-30 ENCOUNTER — Ambulatory Visit (HOSPITAL_COMMUNITY): Payer: Medicaid Other

## 2011-08-30 NOTE — Telephone Encounter (Signed)
Ok he wasn't suppose to come back for a month anyway, it was a mistake on his pt instructions, just schedule him for a month out is fine, thanks

## 2011-08-30 NOTE — Telephone Encounter (Signed)
[name] called today.  He is unable to make his appt today due to no transportation.  He wanted [place] to know that he saw a Kidney Dr, Dr Caryn Section with [facility] and he changed [name] B/P medicine (Lisinopril 10 mg/day).  Since starting this medication his B/P has gone down 130/76 range.  He is feeling better.  He will call back to make follow up appt with daughter schedule later today.

## 2011-09-15 ENCOUNTER — Ambulatory Visit (INDEPENDENT_AMBULATORY_CARE_PROVIDER_SITE_OTHER): Payer: Medicaid Other | Admitting: Pulmonary Disease

## 2011-09-15 ENCOUNTER — Encounter: Payer: Self-pay | Admitting: Pulmonary Disease

## 2011-09-15 VITALS — BP 162/98 | HR 82 | Ht 67.0 in | Wt 166.4 lb

## 2011-09-15 DIAGNOSIS — G4733 Obstructive sleep apnea (adult) (pediatric): Secondary | ICD-10-CM

## 2011-09-15 DIAGNOSIS — J449 Chronic obstructive pulmonary disease, unspecified: Secondary | ICD-10-CM

## 2011-09-15 MED ORDER — ALBUTEROL SULFATE (2.5 MG/3ML) 0.083% IN NEBU: 2.5000 mg | INHALATION_SOLUTION | Freq: Four times a day (QID) | RESPIRATORY_TRACT | Status: DC | PRN

## 2011-09-15 NOTE — Assessment & Plan Note (Signed)
He has been doing better since he started spiriva.  He is able to tolerate this now w/o hoarseness.  Will refill his script for albuterol nebulizer meds.

## 2011-09-15 NOTE — Patient Instructions (Signed)
Follow up in 6 months 

## 2011-09-15 NOTE — Progress Notes (Signed)
Chief Complaint  Patient presents with  . Sleep Apnea    Pt states he tries to wear his cpap everynight. he wears 1-3 hrs a night--sometimes he doesn't realize he has taken his mask off during his sleep  . COPD    pt states breathing has been okay, cough w/ white phlem due to pollen, a lot of wheezing    History of Present Illness: [name] is a 51 y.o. male former smoker with COPD, OSA, systolic CHF.  PSG 07/07/11>>AHI 38.2, SpO2 low 87%. CPAP 7 cm H2O>>AHI 2.1.  He is still getting used to CPAP.  He feels it helps, but has trouble keeping mask on for whole night.  His breathing is doing much better.  He is using spiriva.  He does not need to use albuterol much.   Past Medical History  Diagnosis Date  . Hypertension   . Systolic heart failure     EF 16-10%  . Chronic kidney disease (CKD), stage III (moderate)     baseline cr 2.3  . Hyperlipidemia   . COPD (chronic obstructive pulmonary disease)   . Peripheral vascular disease   . ETOH abuse   . Renal cyst     left, complex    No past surgical history on file.  No Known Allergies  Physical Exam:  Blood pressure 162/98, pulse 82, height 5\' 7"  (1.702 m), weight 166 lb 6.4 oz (75.479 kg), SpO2 97.00%.  Body mass index is 26.06 kg/(m^2).  Wt Readings from Last 2 Encounters:  09/15/11 166 lb 6.4 oz (75.479 kg)  08/23/11 167 lb 4 oz (75.864 kg)    General - obese, no distress  HEENT - no sinus tenderness, no LAN  Cardiac - s1s2 regular, no murmur  Chest - prolonged exhalation, no wheeze/rales  Abd - obese, soft, nontender  Ext - no edema  Neuro - normal strength  Psych - normal mood, behavior   Assessment/Plan:  Outpatient Encounter Prescriptions as of 09/15/2011  Medication Sig Dispense Refill  . albuterol (PROAIR HFA) 108 (90 BASE) MCG/ACT inhaler Inhale 2 puffs into the lungs every 6 (six) hours as needed for wheezing.  1 Inhaler  5  . ALPRAZolam (XANAX) 0.5 MG tablet Take 1 tablet (0.5 mg total) by  mouth 3 (three) times daily as needed. For anxiety  20 tablet  0  . bisoprolol (ZEBETA) 5 MG tablet Take 1.5 tablets (7.5 mg total) by mouth daily.  45 tablet  6  . calcium carbonate (TUMS - DOSED IN MG ELEMENTAL CALCIUM) 500 MG chewable tablet Chew 1 tablet by mouth 2 (two) times daily as needed. For heartburn       . citalopram (CELEXA) 10 MG tablet Take 1 tablet (10 mg total) by mouth daily.  10 tablet  0  . furosemide (LASIX) 80 MG tablet Take 1 tablet (80 mg total) by mouth as needed ( for weight greater than 168).  30 tablet  6  . gabapentin (NEURONTIN) 100 MG capsule Take 100 mg by mouth 3 (three) times daily.      . hydrALAZINE (APRESOLINE) 100 MG tablet Take 1 tablet (100 mg total) by mouth 3 (three) times daily.  90 tablet  6  . isosorbide mononitrate (IMDUR) 30 MG 24 hr tablet Take 1 tablet (30 mg total) by mouth 2 (two) times daily.  60 tablet  6  . tiotropium (SPIRIVA) 18 MCG inhalation capsule Place 18 mcg into inhaler and inhale daily.      Marland Kitchen DISCONTD:  albuterol (PROVENTIL) (5 MG/ML) 0.5% nebulizer solution Take 0.5 mLs (2.5 mg total) by nebulization every 2 (two) hours as needed for wheezing or shortness of breath.  20 mL  2  . DISCONTD: albuterol-ipratropium (COMBIVENT) 18-103 MCG/ACT inhaler Inhale 2 puffs into the lungs every 4 (four) hours as needed for wheezing or shortness of breath (prn cough).  1 Inhaler  3  . DISCONTD: hydrocodone-acetaminophen (LORCET-HD) 5-500 MG per capsule Take 1 capsule by mouth every 6 (six) hours as needed. For pain  30 capsule  0    Maruice Pieroni Pager:  [phone] 09/15/2011, 1:46 PM

## 2011-09-15 NOTE — Assessment & Plan Note (Signed)
I have reviewed his sleep test results with the patient.  Explained how sleep apnea can affect the patient's health.  Driving precautions and importance of weight loss were discussed.  Treatment options for sleep apnea were reviewed.  Encouraged him to maintain his compliance with CPAP.  He does feel benefit from using CPAP.

## 2011-09-27 ENCOUNTER — Ambulatory Visit (HOSPITAL_COMMUNITY)
Admission: RE | Admit: 2011-09-27 | Discharge: 2011-09-27 | Disposition: A | Discharge status: Home / Self Care | Payer: Medicaid Other | Source: Ambulatory Visit | Attending: Internal Medicine | Admitting: Internal Medicine

## 2011-09-27 ENCOUNTER — Encounter (HOSPITAL_COMMUNITY): Payer: Self-pay

## 2011-09-27 VITALS — BP 156/90 | HR 76 | Ht 67.0 in | Wt 167.0 lb

## 2011-09-27 DIAGNOSIS — G4733 Obstructive sleep apnea (adult) (pediatric): Secondary | ICD-10-CM

## 2011-09-27 DIAGNOSIS — I5022 Chronic systolic (congestive) heart failure: Secondary | ICD-10-CM

## 2011-09-27 NOTE — Assessment & Plan Note (Signed)
Continue to use CPAP and inhalers as ordered

## 2011-09-27 NOTE — Assessment & Plan Note (Addendum)
He returns for follow and his volume status remains stable. He continues to use Lasix 80 mg as needed if his weight increases to 165 pounds. Reviewed most recent BMET potassium 5.8  Creatinine 3.88. He is not on ACE/Spironolactone due renal insuffiencey. Reinforced daily weights and low salt diet. He will follow up with Nephrology this week. Follow up in 2 months with an ECHO.   Patient seen and examined with Tonye Becket, NP. We discussed all aspects of the encounter. I agree with the assessment and plan as stated above. Doing well from HF perspective. Volume status looks good. Reinforced need for daily weights and reviewed use of sliding scale diuretics. Renal function slowly deteriorating and will likely need HD in near future. Renal is following.

## 2011-09-27 NOTE — Progress Notes (Signed)
Patient ID: [name], male   DOB: 1960-12-22, 51 y.o.   MRN: [id number]  Referring Physician: Dr Eda Paschal Primary Physician: Dr Orvan Falconer Nephoroligist: Dr Caryn Section Pulmonologist: Dr Craige Cotta  HPI: [name] is a 51 y.o. gentlemen with history of COPD, systolic HF with EF 30%, CRI with baseline Cr 2.3, history of tobacco, OSA (CPAP),  and alcohol abuse.  He has not had a catheterization or stress test.   Echo showed EF 30%, mild LVH, diffuse hypokinesis, mild MR and mod dilated LA.  No PFO.     L/RHCtheterization on 04/08/11.   RA 18, RV mean 21, PA mean 57, PCW 38 with V 50, Fick Cardiac output/index 2.9/1.5.  PVR 6.6 Woods (although felt overestimated due to MR).  Normal coronaries.  He was admitted for cardiogenic shock and placed on milrinone.  During hospitalization ABIs: Right 1.34, Left 1.18 and PFTs: FVC 64% predicted, FEV1 57% predicted, FEV1/FVC 88% predicted, FEF 25-75% 42% predicted, DLCO 61% predicted.  No significant response to bronchodilators.  Dr. Craige Cotta evaluated the patient and discontinued Spiriva and started combivent although he is awaiting aid from the [facility] clinic to afford this prescription.  D/C weight was 167 pounds.  09/24/11 Potassium 5.8 Creatinine 3.88 Sodium 134  He is here for follow up. Feels ok. Denies SOB/Orthopnea. +PND. He is taking Xanax at night and he is able to tolerate CPAP. He able to use CPAP nightly.   He is able to walk longer distances. SOB going up stairs. He will start home nebulizer. Weight at home 161-164.  He only take Lasix 80 mg if his weight increases to 165 pounds. He has only required a few doses of Lasix when his weight went to 168 pounds. Compliant with medications. He prepares pill box every Sunday morning. He remains tobacco free. SBP at home 120-130.   ROS: All pertinent positive/negatives in HPI otherwise negative    Past Medical History  Diagnosis Date  . Hypertension   . Systolic heart failure     EF 40-98%  . Chronic kidney disease (CKD),  stage III (moderate)     baseline cr 2.3  . Hyperlipidemia   . COPD (chronic obstructive pulmonary disease)   . Peripheral vascular disease   . ETOH abuse   . Renal cyst     left, complex     (Not in a hospital admission)  No Known Allergies   PHYSICAL EXAM: Filed Vitals:   09/27/11 1039  BP: 156/90  Pulse: 76  Height: 5\' 7"  (1.702 m)  Weight: 167 lb (75.751 kg)  SpO2: 97%    General:  No respiratory difficulty HEENT: normal Neck: thick, supple. JVP difficult to assess but appears flat. Carotids 2+ bilat; no bruits. No lymphadenopathy or thryomegaly appreciated. Cor: PMI nondisplaced. Distant regular rate & rhythm. 2/6 MR murmurs. Lungs: LLL EW RML and RLL EW  Abdomen: soft, nontender, nondistended. No hepatosplenomegaly. No bruits or masses. Good bowel sounds. Extremities: no cyanosis, rash, trace edema, bilateral clubbing.   Neuro: alert & oriented x 3, cranial nerves grossly intact. moves all 4 extremities w/o difficulty. Affect pleasant.   ASSESSMENT/PLAN:

## 2011-09-27 NOTE — Patient Instructions (Addendum)
Follow up in 2 months with an ECHO  Do the following things EVERYDAY: 1) Weigh yourself in the morning before breakfast. Write it down and keep it in a log. 2) Take your medicines as prescribed 3) Eat low salt foods-Limit salt (sodium) to 2000 mg per day.  4) Stay as active as you can everyday 5) Limit all fluids for the day to less than 2 liters 

## 2011-09-28 ENCOUNTER — Encounter (HOSPITAL_COMMUNITY): Payer: Self-pay

## 2011-09-29 ENCOUNTER — Other Ambulatory Visit (HOSPITAL_COMMUNITY): Payer: Self-pay | Admitting: Nephrology

## 2011-09-29 DIAGNOSIS — N189 Chronic kidney disease, unspecified: Secondary | ICD-10-CM

## 2011-10-05 ENCOUNTER — Other Ambulatory Visit (HOSPITAL_COMMUNITY): Payer: Medicaid Other

## 2011-10-13 ENCOUNTER — Inpatient Hospital Stay (HOSPITAL_COMMUNITY): Admission: RE | Admit: 2011-10-13 | Payer: Medicaid Other | Source: Ambulatory Visit

## 2011-10-21 ENCOUNTER — Encounter: Payer: Self-pay | Admitting: Internal Medicine

## 2011-11-05 ENCOUNTER — Ambulatory Visit (HOSPITAL_COMMUNITY)
Admission: RE | Admit: 2011-11-05 | Discharge: 2011-11-05 | Disposition: A | Discharge status: Home / Self Care | Payer: Medicaid Other | Source: Ambulatory Visit | Attending: Nephrology | Admitting: Nephrology

## 2011-11-05 DIAGNOSIS — Q619 Cystic kidney disease, unspecified: Secondary | ICD-10-CM | POA: Insufficient documentation

## 2011-11-05 DIAGNOSIS — N189 Chronic kidney disease, unspecified: Secondary | ICD-10-CM | POA: Insufficient documentation

## 2011-12-08 ENCOUNTER — Ambulatory Visit (HOSPITAL_COMMUNITY)
Admission: RE | Admit: 2011-12-08 | Discharge: 2011-12-08 | Disposition: A | Discharge status: Home / Self Care | Payer: Medicaid Other | Source: Ambulatory Visit | Attending: Internal Medicine | Admitting: Internal Medicine

## 2011-12-08 ENCOUNTER — Encounter (HOSPITAL_COMMUNITY): Payer: Self-pay

## 2011-12-08 VITALS — BP 198/98 | HR 69 | Ht 67.0 in | Wt 164.8 lb

## 2011-12-08 DIAGNOSIS — I5022 Chronic systolic (congestive) heart failure: Secondary | ICD-10-CM

## 2011-12-08 DIAGNOSIS — G4733 Obstructive sleep apnea (adult) (pediatric): Secondary | ICD-10-CM | POA: Insufficient documentation

## 2011-12-08 DIAGNOSIS — I10 Essential (primary) hypertension: Secondary | ICD-10-CM | POA: Insufficient documentation

## 2011-12-08 DIAGNOSIS — I059 Rheumatic mitral valve disease, unspecified: Secondary | ICD-10-CM

## 2011-12-08 MED ORDER — CLONIDINE HCL 0.1 MG PO TABS: 0.2000 mg | ORAL_TABLET | Freq: Once | ORAL | Status: DC

## 2011-12-08 MED ORDER — AMLODIPINE BESYLATE 10 MG PO TABS: 10.0000 mg | ORAL_TABLET | Freq: Every day | ORAL | Status: DC

## 2011-12-08 NOTE — Assessment & Plan Note (Signed)
Continue CPAP.  Follow up with Dr. Craige Cotta.

## 2011-12-08 NOTE — Assessment & Plan Note (Addendum)
Blood pressure uncontrolled, he is unsure of medications at this time.  Have given him clonidine 0.2 mg while in clinic today.  SBP down to 200.  Will call in norvasc 10 mg daily, he will pick up today and start immediately.  Will have him follow up in 5 days.  He will bring all medications, list of recorded BPs, and BP cuff.  Have discussed the plan with Dr. [name] via phone and he agrees.

## 2011-12-08 NOTE — Assessment & Plan Note (Signed)
EF recovered.  Volume status looks good.  Continue current medications.

## 2011-12-08 NOTE — Progress Notes (Signed)
  Echocardiogram 2D Echocardiogram has been performed.  [name] 12/08/2011, 11:22 AM

## 2011-12-08 NOTE — Patient Instructions (Addendum)
Start amlodipine 10 mg daily.  Bring all medications and blood pressure cuff to appointment on Monday.   Bring list of blood pressure monitors.   Follow up Monday after appointment with Dr. Caryn Section.

## 2011-12-08 NOTE — Progress Notes (Signed)
Primary Physician: Dr Orvan Falconer Nephoroligist: Dr Caryn Section Pulmonologist: Dr Craige Cotta  HPI: [name] is a 51 y.o. gentlemen with history of COPD, systolic HF with EF 30%, CRI with baseline Cr 2.3, history of tobacco, OSA (CPAP),  and alcohol abuse.  He has not had a catheterization or stress test.   Echo showed EF 30%, mild LVH, diffuse hypokinesis, mild MR and mod dilated LA.  No PFO.     L/RHCtheterization on 04/08/11.   RA 18, RV mean 21, PA mean 57, PCW 38 with V 50, Fick Cardiac output/index 2.9/1.5.  PVR 6.6 Woods (although felt overestimated due to MR).  Normal coronaries.  He was admitted for cardiogenic shock and placed on milrinone.  During hospitalization ABIs: Right 1.34, Left 1.18 and PFTs: FVC 64% predicted, FEV1 57% predicted, FEV1/FVC 88% predicted, FEF 25-75% 42% predicted, DLCO 61% predicted.  No significant response to bronchodilators.  Dr. Craige Cotta evaluated the patient and discontinued Spiriva and started combivent although he is awaiting aid from the [facility] clinic to afford this prescription.  D/C weight was 167 pounds.  09/24/11 Potassium 5.8 Creatinine 3.88 Sodium 134  He is here for follow up.  Increased fatigue over the last month.  He started group therapy 3 weeks ago, helping him a lot.  Continues to wear CPAP at night.  Started having aches throughout the day, oxy helps.  Not having to take lasix.  Denies SOB/Orthopnea/PND.  Dyspnea at basline Compliant with medications. He prepares pill box every Sunday morning. He remains tobacco free.  BP elevated in clinic today but SBP 150s in am and 120s in afternoon per his cuff.  Unsure if meds are accurate as last note from Dr. Caryn Section states bisoprolol was increased to 10 mg daily and added clonidine 0.2 mg BID, patient does not have his meds today.  He denies HA/dizziness/blurred vision.    Today Echo: EF 55-60%  ROS: All pertinent positive/negatives in HPI otherwise negative    Past Medical History  Diagnosis Date  . Hypertension   . Systolic  heart failure     EF 25-30%  . Chronic kidney disease (CKD), stage III (moderate)     baseline cr 2.3  . Hyperlipidemia   . COPD (chronic obstructive pulmonary disease)   . Peripheral vascular disease   . ETOH abuse   . Renal cyst     left, complex    Current Outpatient Prescriptions on File Prior to Encounter  Medication Sig Dispense Refill  . albuterol (PROAIR HFA) 108 (90 BASE) MCG/ACT inhaler Inhale 2 puffs into the lungs every 6 (six) hours as needed for wheezing.  1 Inhaler  5  . albuterol (PROVENTIL) (2.5 MG/3ML) 0.083% nebulizer solution Take 3 mLs (2.5 mg total) by nebulization every 6 (six) hours as needed for wheezing.  75 mL  12  . ALPRAZolam (XANAX) 0.5 MG tablet Take 0.5 mg by mouth at bedtime as needed.      . bisoprolol (ZEBETA) 5 MG tablet Take 1.5 tablets (7.5 mg total) by mouth daily.  45 tablet  6  . calcium carbonate (TUMS - DOSED IN MG ELEMENTAL CALCIUM) 500 MG chewable tablet Chew 1 tablet by mouth 2 (two) times daily as needed. For heartburn       . citalopram (CELEXA) 10 MG tablet Take 1 tablet (10 mg total) by mouth daily.  10 tablet  0  . furosemide (LASIX) 80 MG tablet Take 1 tablet (80 mg total) by mouth as needed ( for weight greater than 168).  30 tablet  6  . gabapentin (NEURONTIN) 100 MG capsule Take 100 mg by mouth 3 (three) times daily.      . hydrALAZINE (APRESOLINE) 100 MG tablet Take 100 mg by mouth 3 (three) times daily.       . isosorbide mononitrate (IMDUR) 30 MG 24 hr tablet Take 1 tablet (30 mg total) by mouth 2 (two) times daily.  60 tablet  6  . tiotropium (SPIRIVA) 18 MCG inhalation capsule Place 18 mcg into inhaler and inhale daily.        No Known Allergies   PHYSICAL EXAM: Filed Vitals:   12/08/11 1135  BP: 198/98  Pulse: 69  Height: 5\' 7"  (1.702 m)  Weight: 164 lb 12.8 oz (74.753 kg)  SpO2: 99%  Repeat blood pressure 220/115 left arm and 210/100 right arm   General:  No respiratory difficulty HEENT: normal Neck: thick,  supple. JVP difficult to assess but appears flat. Carotids 2+ bilat; no bruits. No lymphadenopathy or thryomegaly appreciated. Cor: PMI nondisplaced. Distant regular rate & rhythm. 2/6 MR murmurs. Lungs: clear.  Abdomen: soft, nontender, nondistended. No hepatosplenomegaly. No bruits or masses. Good bowel sounds. Extremities: no cyanosis, rash, trace edema, bilateral clubbing.   Neuro: alert & oriented x 3, cranial nerves grossly intact. moves all 4 extremities w/o difficulty. Affect pleasant.   ASSESSMENT/PLAN:

## 2011-12-13 ENCOUNTER — Ambulatory Visit (HOSPITAL_COMMUNITY)
Admission: RE | Admit: 2011-12-13 | Discharge: 2011-12-13 | Disposition: A | Discharge status: Home / Self Care | Payer: Medicaid Other | Source: Ambulatory Visit | Attending: Internal Medicine | Admitting: Internal Medicine

## 2011-12-13 ENCOUNTER — Encounter (HOSPITAL_COMMUNITY): Payer: Self-pay

## 2011-12-13 VITALS — BP 172/86 | HR 67 | Ht 67.0 in | Wt 165.4 lb

## 2011-12-13 DIAGNOSIS — J4489 Other specified chronic obstructive pulmonary disease: Secondary | ICD-10-CM | POA: Insufficient documentation

## 2011-12-13 DIAGNOSIS — G4733 Obstructive sleep apnea (adult) (pediatric): Secondary | ICD-10-CM | POA: Insufficient documentation

## 2011-12-13 DIAGNOSIS — N183 Chronic kidney disease, stage 3 unspecified: Secondary | ICD-10-CM | POA: Insufficient documentation

## 2011-12-13 DIAGNOSIS — E785 Hyperlipidemia, unspecified: Secondary | ICD-10-CM | POA: Insufficient documentation

## 2011-12-13 DIAGNOSIS — J449 Chronic obstructive pulmonary disease, unspecified: Secondary | ICD-10-CM | POA: Insufficient documentation

## 2011-12-13 DIAGNOSIS — Q619 Cystic kidney disease, unspecified: Secondary | ICD-10-CM | POA: Insufficient documentation

## 2011-12-13 DIAGNOSIS — I10 Essential (primary) hypertension: Secondary | ICD-10-CM

## 2011-12-13 DIAGNOSIS — I5022 Chronic systolic (congestive) heart failure: Secondary | ICD-10-CM

## 2011-12-13 DIAGNOSIS — F1010 Alcohol abuse, uncomplicated: Secondary | ICD-10-CM | POA: Insufficient documentation

## 2011-12-13 DIAGNOSIS — I059 Rheumatic mitral valve disease, unspecified: Secondary | ICD-10-CM | POA: Insufficient documentation

## 2011-12-13 DIAGNOSIS — Z87891 Personal history of nicotine dependence: Secondary | ICD-10-CM | POA: Insufficient documentation

## 2011-12-13 DIAGNOSIS — I517 Cardiomegaly: Secondary | ICD-10-CM | POA: Insufficient documentation

## 2011-12-13 DIAGNOSIS — I150 Renovascular hypertension: Secondary | ICD-10-CM | POA: Insufficient documentation

## 2011-12-13 DIAGNOSIS — I701 Atherosclerosis of renal artery: Secondary | ICD-10-CM | POA: Insufficient documentation

## 2011-12-13 DIAGNOSIS — I739 Peripheral vascular disease, unspecified: Secondary | ICD-10-CM | POA: Insufficient documentation

## 2011-12-13 DIAGNOSIS — I5020 Unspecified systolic (congestive) heart failure: Secondary | ICD-10-CM | POA: Insufficient documentation

## 2011-12-13 NOTE — Progress Notes (Addendum)
Primary Physician: Dr Orvan Falconer Nephoroligist: Dr Caryn Section Pulmonologist: Dr Craige Cotta  HPI: [name] is a 51 y.o. gentlemen with history of COPD, systolic HF with EF 30% (recovered to 55-60% in 7/13), CRI with baseline Cr 2.3, history of tobacco, OSA (CPAP),  and alcohol abuse.  L/RHCtheterization on 04/08/11.   RA 18, RV mean 21, PA mean 57, PCW 38 with V 50, Fick Cardiac output/index 2.9/1.5.  PVR 6.6 Woods (although felt overestimated due to MR).  Normal coronaries.  He was admitted for cardiogenic shock and placed on milrinone.  During hospitalization ABIs: Right 1.34, Left 1.18 and PFTs: FVC 64% predicted, FEV1 57% predicted, FEV1/FVC 88% predicted, FEF 25-75% 42% predicted, DLCO 61% predicted.  No significant response to bronchodilators.  Dr. Craige Cotta evaluated the patient and discontinued Spiriva and started combivent although he is awaiting aid from the [facility] clinic to afford this prescription.  D/C weight was 167 pounds.  09/24/11 Potassium 5.8 Creatinine 3.88 Sodium 134 12/08/11  Echo 55-60%  He is here for BP recheck.  Dr. Caryn Section increased clonidine 0.2 mg 1.5-2 tabs BID.  Amlodipine 10 mg added last week.  BP at home   AM BP 198/111, 205/120, 176/102, 193/110.  Evening 143/82, 167/86.  Bilateral renal artery stenosis, followed by Dr. Caryn Section with plans to refer to Dr. Excell Seltzer for possible stent.  Feels ok today.  Had episode of dizziness 2 days ago lasting 15 min.  One SBP reading is 114 which may correlate with dizziness.  He denies SOB/Orthopnea/PND.  Dyspnea at basline Compliant with medications. He prepares pill box every Sunday morning. Wearing CPAP.   He denies HA/dizziness/blurred vision.  ROS: All pertinent positive/negatives in HPI otherwise negative    Past Medical History  Diagnosis Date  . Hypertension   . Systolic heart failure     EF 95-62%  . Chronic kidney disease (CKD), stage III (moderate)     baseline cr 2.3  . Hyperlipidemia   . COPD (chronic obstructive pulmonary disease)   .  Peripheral vascular disease   . ETOH abuse   . Renal cyst     left, complex    Current Outpatient Prescriptions on File Prior to Encounter  Medication Sig Dispense Refill  . albuterol (PROAIR HFA) 108 (90 BASE) MCG/ACT inhaler Inhale 2 puffs into the lungs every 6 (six) hours as needed for wheezing.  1 Inhaler  5  . albuterol (PROVENTIL) (2.5 MG/3ML) 0.083% nebulizer solution Take 3 mLs (2.5 mg total) by nebulization every 6 (six) hours as needed for wheezing.  75 mL  12  . ALPRAZolam (XANAX) 0.5 MG tablet Take 0.5 mg by mouth at bedtime as needed.      . calcium carbonate (TUMS - DOSED IN MG ELEMENTAL CALCIUM) 500 MG chewable tablet Chew 1 tablet by mouth 2 (two) times daily as needed. For heartburn       . citalopram (CELEXA) 10 MG tablet Take 1 tablet (10 mg total) by mouth daily.  10 tablet  0  . furosemide (LASIX) 80 MG tablet Take 1 tablet (80 mg total) by mouth as needed ( for weight greater than 168).  30 tablet  6  . gabapentin (NEURONTIN) 100 MG capsule Take 100 mg by mouth 3 (three) times daily.      . hydrALAZINE (APRESOLINE) 100 MG tablet Take 100 mg by mouth 3 (three) times daily.       . isosorbide mononitrate (IMDUR) 30 MG 24 hr tablet Take 1 tablet (30 mg total) by mouth 2 (two)  times daily.  60 tablet  6  . oxycodone (OXY-IR) 5 MG capsule Take 5 mg by mouth as directed.      . tiotropium (SPIRIVA) 18 MCG inhalation capsule Place 18 mcg into inhaler and inhale daily.      Marland Kitchen DISCONTD: bisoprolol (ZEBETA) 5 MG tablet Take 1.5 tablets (7.5 mg total) by mouth daily.  45 tablet  6  . DISCONTD: cloNIDine (CATAPRES) 0.1 MG tablet Take 2 tablets (0.2 mg total) by mouth once.  60 tablet  11    No Known Allergies   PHYSICAL EXAM: Filed Vitals:   12/13/11 1242  BP: 172/86  Pulse: 67  Height: 5\' 7"  (1.702 m)  Weight: 165 lb 6.4 oz (75.025 kg)  SpO2: 97%  Home BP monitor 176/102  General:  No respiratory difficulty HEENT: normal Neck: thick, supple. JVP difficult to assess  but appears flat. Carotids 2+ bilat; no bruits. No lymphadenopathy or thryomegaly appreciated. Cor: PMI nondisplaced. Distant regular rate & rhythm. 2/6 MR murmurs. Lungs: rhonchi throughout with expiratory wheezes  Abdomen: soft, nontender, nondistended. No hepatosplenomegaly. No bruits or masses. Good bowel sounds. Extremities: no cyanosis, rash, trace edema, bilateral clubbing.   Neuro: alert & oriented x 3, cranial nerves grossly intact. moves all 4 extremities w/o difficulty. Affect pleasant.   ASSESSMENT/PLAN:

## 2011-12-13 NOTE — Assessment & Plan Note (Addendum)
Will refer to Dr. Excell Seltzer for further evaluation and possible stent placement.   Patient seen and examined with Ulyess Blossom, PA-C. We discussed all aspects of the encounter. I agree with the assessment and plan as stated above.  Will refer to [facility] clinic to evaluate for PCI of renals.

## 2011-12-13 NOTE — Assessment & Plan Note (Addendum)
Have reviewed MRA with bilateral renal artery stenosis.  Will refer to Dr. Excell Seltzer for possible stent placement.  Have reviewed blood pressures and morning BP much higher then afternoon/evening will therefore move amlodipine to bedtime for better am coverage.  Otherwise agree with increase in clonidine per Dr. Caryn Section.     Attending: Agree with above. Given pattern of BP will move amlodipine to bedtime. Refer to Dr. Excell Seltzer for possible PCI of renals.

## 2011-12-13 NOTE — Patient Instructions (Addendum)
Will refer to Dr Excell Seltzer for renal artery stenosis evaluation.   Continue current medications.  Follow up 2-3 months.

## 2011-12-22 ENCOUNTER — Ambulatory Visit: Payer: Medicaid Other | Admitting: Cardiovascular Disease

## 2011-12-29 ENCOUNTER — Ambulatory Visit (INDEPENDENT_AMBULATORY_CARE_PROVIDER_SITE_OTHER): Payer: Medicaid Other | Admitting: Cardiovascular Disease

## 2011-12-29 ENCOUNTER — Encounter (HOSPITAL_COMMUNITY): Payer: Self-pay | Admitting: Pharmacy Technician

## 2011-12-29 ENCOUNTER — Encounter: Payer: Self-pay | Admitting: Cardiovascular Disease

## 2011-12-29 VITALS — BP 148/86 | HR 73 | Ht 67.0 in | Wt 161.1 lb

## 2011-12-29 DIAGNOSIS — I701 Atherosclerosis of renal artery: Secondary | ICD-10-CM

## 2011-12-29 DIAGNOSIS — I509 Heart failure, unspecified: Secondary | ICD-10-CM

## 2011-12-29 DIAGNOSIS — Z01812 Encounter for preprocedural laboratory examination: Secondary | ICD-10-CM

## 2011-12-29 LAB — PROTIME-INR
INR: 0.9 ratio (ref 0.8–1.0)
Prothrombin Time: 9.6 s — ABNORMAL LOW (ref 10.2–12.4)

## 2011-12-29 LAB — CBC WITH DIFFERENTIAL/PLATELET
Basophils Absolute: 0.1 10*3/uL (ref 0.0–0.1)
HCT: 35.2 % — ABNORMAL LOW (ref 39.0–52.0)
Lymphs Abs: 0.8 10*3/uL (ref 0.7–4.0)
MCV: 91.4 fl (ref 78.0–100.0)
Monocytes Absolute: 0.8 10*3/uL (ref 0.1–1.0)
Neutrophils Relative %: 77.6 % — ABNORMAL HIGH (ref 43.0–77.0)
Platelets: 276 10*3/uL (ref 150.0–400.0)
RDW: 15.9 % — ABNORMAL HIGH (ref 11.5–14.6)

## 2011-12-29 LAB — BASIC METABOLIC PANEL
BUN: 49 mg/dL — ABNORMAL HIGH (ref 6–23)
Creatinine, Ser: 3.5 mg/dL — ABNORMAL HIGH (ref 0.4–1.5)
GFR: 19.99 mL/min — ABNORMAL LOW (ref >60.00)
Glucose, Bld: 95 mg/dL (ref 70–99)
Potassium: 4.6 mEq/L (ref 3.5–5.1)

## 2011-12-29 NOTE — Patient Instructions (Addendum)
Please refer to the letter you were given for your instructions for your procedure on 01/05/12  Your physician recommends that you return for lab work in: today (bmet, cbc, pt/inr)

## 2011-12-29 NOTE — Assessment & Plan Note (Signed)
The patient has significant bilateral renal artery stenosis in at least 3/5 renal arteries. It is possible that he might have ischemic nephropathy contributing to his chronic kidney disease. I had a prolonged discussion with him about the risks and benefits of proceeding with angiography and possible endovascular intervention. Obviously the biggest risk is contrast-induced nephropathy due to his advanced chronic kidney disease. Also I discussed with him that even with successful revascularization, that is not a guarantee for improved renal function if his kidneys already have been reversible damage or if the etiology of chronic kidney disease is due to something else. Nonetheless, there is potentially significant benefit in revascularization in terms of renal function preservation. I will plan on doing the procedure mostly with CO2 and avoid excessive contrast use. I will check preprocedure labs. If his creatinine is about 3, I will likely admit him to the hospital the night before for gentle hydration which will have to be done carefully given his known history of congestive heart failure.

## 2011-12-29 NOTE — Progress Notes (Signed)
  HPI [name] is a 50 y.o. gentlemen who was referred by Dr. Fox and Dr. Bensimhon for evaluation and management of renal artery stenosis. The patient has history of COPD, systolic HF with EF 30%, chronic kidney disease, history of tobacco, OSA (CPAP), and alcohol abuse. L/RHCtheterization on 04/08/11. RA 18, RV mean 21, PA mean 57, PCW 38 with V 50, Fick Cardiac output/index 2.9/1.5. PVR 6.6 Woods (although felt overestimated due to MR). Normal coronaries. He was admitted for cardiogenic shock and placed on milrinone.  In November of 2012 his creatinine was 3.77. Since then, he had fluctuations in renal function with worse creatinine of 4.23 in May of this year. Most recently in June his creatinine was 3.09. The patient had renal artery duplex ultrasound which showed absence of flow in the proximal and mid right renal artery with significant left renal artery stenosis greater than 60%. He had an MRA done which showed that the patient has 3 left renal arteries and 2 right renal arteries. There was significant stenosis in 3 of these. The patient was referred to consider revascularization.  No Known Allergies   Current Outpatient Prescriptions on File Prior to Visit  Medication Sig Dispense Refill  . bisoprolol (ZEBETA) 5 MG tablet Take 10 mg by mouth daily.       . calcium carbonate (TUMS - DOSED IN MG ELEMENTAL CALCIUM) 500 MG chewable tablet Chew 1 tablet by mouth 2 (two) times daily as needed. For heartburn       . carvedilol (COREG) 25 MG tablet Take 25 mg by mouth 2 (two) times daily with a meal.      . cloNIDine (CATAPRES) 0.1 MG tablet Take 0.15 mg by mouth 2 (two) times daily.       . gabapentin (NEURONTIN) 100 MG capsule Take 100 mg by mouth 3 (three) times daily.      . hydrALAZINE (APRESOLINE) 100 MG tablet Take 100 mg by mouth 3 (three) times daily.       . oxycodone (OXY-IR) 5 MG capsule Take 5 mg by mouth every 6 (six) hours as needed. For pain.      . tiotropium (SPIRIVA) 18 MCG  inhalation capsule Place 18 mcg into inhaler and inhale daily.         Past Medical History  Diagnosis Date  . Hypertension   . Systolic heart failure     EF 25-30%  . Chronic kidney disease (CKD), stage III (moderate)     baseline cr 2.3  . Hyperlipidemia   . COPD (chronic obstructive pulmonary disease)   . Peripheral vascular disease   . ETOH abuse   . Renal cyst     left, complex     No past surgical history on file.   Family History  Problem Relation Age of Onset  . Brain cancer Mother   . Pancreatic cancer Father   . Heart failure Paternal Uncle   . Heart disease Paternal Uncle   . Heart failure Paternal Grandfather   . Heart disease Paternal Grandfather      History   Social History  . Marital Status: Widowed    Spouse Name: N/A    Number of Children: N/A  . Years of Education: N/A   Occupational History  . Not on file.   Social History Main Topics  . Smoking status: Former Smoker -- 1.0 packs/day for 20 years    Types: Cigarettes    Quit date: 06/10/2010  . Smokeless tobacco: Current User      Types: Snuff, Chew   Comment: 1 to 2 times a week  . Alcohol Use: No     quit drinking in Feb 2012  . Drug Use: No  . Sexually Active: Not on file   Other Topics Concern  . Not on file   Social History Narrative  . No narrative on file     ROS Constitutional: Negative for fever, chills, diaphoresis, activity change, appetite change and fatigue.  HENT: Negative for hearing loss, nosebleeds, congestion, sore throat, facial swelling, drooling, trouble swallowing, neck pain, voice change, sinus pressure and tinnitus.  Eyes: Negative for photophobia, pain, discharge and visual disturbance.  Respiratory: Negative for apnea, cough and wheezing.  Cardiovascular: Negative for palpitations and leg swelling.  Gastrointestinal: Negative for nausea, vomiting, abdominal pain, diarrhea, constipation, blood in stool and abdominal distention.  Genitourinary: Negative  for dysuria, urgency, frequency, hematuria and decreased urine volume.  Musculoskeletal: Negative for myalgias, back pain, joint swelling, arthralgias and gait problem.  Skin: Negative for color change, pallor, rash and wound.  Neurological: Negative for dizziness, tremors, seizures, syncope, speech difficulty, weakness, light-headedness, numbness and headaches.  Psychiatric/Behavioral: Negative for suicidal ideas, hallucinations, behavioral problems and agitation. The patient is not nervous/anxious.     PHYSICAL EXAM   BP 148/86  Pulse 73  Ht 5' 7" (1.702 m)  Wt 161 lb 1.9 oz (73.084 kg)  BMI 25.24 kg/m2 Constitutional: He is oriented to person, place, and time. He appears well-developed and well-nourished. No distress.  HENT: No nasal discharge.  Head: Normocephalic and atraumatic.  Eyes: Pupils are equal and round. Right eye exhibits no discharge. Left eye exhibits no discharge.  Neck: Normal range of motion. Neck supple. No JVD present. No thyromegaly present.  Cardiovascular: Normal rate, regular rhythm, normal heart sounds and. Exam reveals no gallop and no friction rub. No murmur heard.  Pulmonary/Chest: Effort normal and breath sounds normal. No stridor. No respiratory distress. He has no wheezes. He has no rales. He exhibits no tenderness.  Abdominal: Soft. Bowel sounds are normal. He exhibits no distension. There is no tenderness. There is no rebound and no guarding.  Musculoskeletal: Normal range of motion. He exhibits no edema and no tenderness.  Neurological: He is alert and oriented to person, place, and time. Coordination normal.  Skin: Skin is warm and dry. No rash noted. He is not diaphoretic. No erythema. No pallor.  Psychiatric: He has a normal mood and affect. His behavior is normal. Judgment and thought content normal.  Vascular: No abdominal bruits. Femoral pulses are normal.     EKG: Normal sinus rhythm with no significant ST or T wave changes.   ASSESSMENT  AND PLAN   

## 2012-01-04 ENCOUNTER — Other Ambulatory Visit: Payer: Self-pay | Admitting: Cardiovascular Disease

## 2012-01-04 DIAGNOSIS — I701 Atherosclerosis of renal artery: Secondary | ICD-10-CM

## 2012-01-05 ENCOUNTER — Encounter (HOSPITAL_COMMUNITY): Payer: Self-pay | Admitting: General Practice

## 2012-01-05 ENCOUNTER — Ambulatory Visit (HOSPITAL_COMMUNITY)
Admission: RE | Admit: 2012-01-05 | Discharge: 2012-01-06 | Disposition: A | Discharge status: Home / Self Care | Payer: Medicaid Other | Source: Ambulatory Visit | Attending: Cardiovascular Disease | Admitting: Cardiovascular Disease

## 2012-01-05 ENCOUNTER — Encounter (HOSPITAL_COMMUNITY)
Admission: RE | Disposition: A | Discharge status: Home / Self Care | Payer: Self-pay | Source: Ambulatory Visit | Attending: Cardiovascular Disease

## 2012-01-05 DIAGNOSIS — E785 Hyperlipidemia, unspecified: Secondary | ICD-10-CM | POA: Diagnosis present

## 2012-01-05 DIAGNOSIS — I701 Atherosclerosis of renal artery: Secondary | ICD-10-CM | POA: Diagnosis present

## 2012-01-05 DIAGNOSIS — J4489 Other specified chronic obstructive pulmonary disease: Secondary | ICD-10-CM | POA: Insufficient documentation

## 2012-01-05 DIAGNOSIS — G4733 Obstructive sleep apnea (adult) (pediatric): Secondary | ICD-10-CM | POA: Diagnosis present

## 2012-01-05 DIAGNOSIS — I10 Essential (primary) hypertension: Secondary | ICD-10-CM | POA: Diagnosis present

## 2012-01-05 DIAGNOSIS — J449 Chronic obstructive pulmonary disease, unspecified: Secondary | ICD-10-CM | POA: Diagnosis present

## 2012-01-05 DIAGNOSIS — I150 Renovascular hypertension: Secondary | ICD-10-CM | POA: Insufficient documentation

## 2012-01-05 DIAGNOSIS — I5020 Unspecified systolic (congestive) heart failure: Secondary | ICD-10-CM | POA: Insufficient documentation

## 2012-01-05 DIAGNOSIS — N183 Chronic kidney disease, stage 3 unspecified: Secondary | ICD-10-CM | POA: Insufficient documentation

## 2012-01-05 DIAGNOSIS — I5022 Chronic systolic (congestive) heart failure: Secondary | ICD-10-CM | POA: Diagnosis present

## 2012-01-05 DIAGNOSIS — N186 End stage renal disease: Secondary | ICD-10-CM | POA: Diagnosis present

## 2012-01-05 DIAGNOSIS — I739 Peripheral vascular disease, unspecified: Secondary | ICD-10-CM | POA: Diagnosis present

## 2012-01-05 DIAGNOSIS — F1010 Alcohol abuse, uncomplicated: Secondary | ICD-10-CM | POA: Insufficient documentation

## 2012-01-05 HISTORY — DX: Low back pain, unspecified: M54.50

## 2012-01-05 HISTORY — DX: Anxiety disorder, unspecified: F41.9

## 2012-01-05 HISTORY — DX: Dependence on other enabling machines and devices: Z99.89

## 2012-01-05 HISTORY — DX: Major depressive disorder, single episode, unspecified: F32.9

## 2012-01-05 HISTORY — PX: RENAL ARTERY STENT: SHX2321

## 2012-01-05 HISTORY — DX: Personal history of other diseases of the digestive system: Z87.19

## 2012-01-05 HISTORY — DX: Depression, unspecified: F32.A

## 2012-01-05 HISTORY — DX: Pneumonia, unspecified organism: J18.9

## 2012-01-05 HISTORY — DX: Unspecified osteoarthritis, unspecified site: M19.90

## 2012-01-05 HISTORY — DX: Attention-deficit hyperactivity disorder, unspecified type: F90.9

## 2012-01-05 HISTORY — PX: ABDOMINAL AORTAGRAM: SHX5454

## 2012-01-05 HISTORY — DX: Obstructive sleep apnea (adult) (pediatric): G47.33

## 2012-01-05 HISTORY — DX: Other chronic pain: G89.29

## 2012-01-05 HISTORY — DX: Personal history of peptic ulcer disease: Z87.11

## 2012-01-05 HISTORY — DX: Atherosclerosis of renal artery: I70.1

## 2012-01-05 HISTORY — PX: RENAL ANGIOGRAM: SHX5509

## 2012-01-05 HISTORY — DX: Low back pain: M54.5

## 2012-01-05 HISTORY — DX: Angina pectoris, unspecified: I20.9

## 2012-01-05 LAB — BASIC METABOLIC PANEL
BUN: 40 mg/dL — ABNORMAL HIGH (ref 6–23)
Calcium: 9.3 mg/dL (ref 8.4–10.5)
Creatinine, Ser: 3.39 mg/dL — ABNORMAL HIGH (ref 0.50–1.35)
GFR calc Af Amer: 23 mL/min — ABNORMAL LOW (ref >90)
GFR calc non Af Amer: 20 mL/min — ABNORMAL LOW (ref >90)

## 2012-01-05 SURGERY — ABDOMINAL AORTAGRAM
Anesthesia: LOCAL

## 2012-01-05 MED ORDER — CLONIDINE HCL 0.3 MG PO TABS
0.1500 mg | ORAL_TABLET | Freq: Two times a day (BID) | ORAL | Status: DC
  Administered 2012-01-05 – 2012-01-06 (×2): 0.15 mg via ORAL
  Filled 2012-01-05 (×3): qty 0.5

## 2012-01-05 MED ORDER — HYDRALAZINE HCL 50 MG PO TABS
100.0000 mg | ORAL_TABLET | Freq: Three times a day (TID) | ORAL | Status: DC
  Administered 2012-01-05 – 2012-01-06 (×4): 100 mg via ORAL
  Filled 2012-01-05 (×6): qty 2

## 2012-01-05 MED ORDER — AMLODIPINE BESYLATE 10 MG PO TABS
10.0000 mg | ORAL_TABLET | Freq: Every day | ORAL | Status: DC
  Administered 2012-01-06: 10 mg via ORAL
  Filled 2012-01-05: qty 1

## 2012-01-05 MED ORDER — ISOSORBIDE MONONITRATE ER 30 MG PO TB24
30.0000 mg | ORAL_TABLET | Freq: Two times a day (BID) | ORAL | Status: DC
  Administered 2012-01-05 – 2012-01-06 (×2): 30 mg via ORAL
  Filled 2012-01-05 (×3): qty 1

## 2012-01-05 MED ORDER — MIDAZOLAM HCL 2 MG/2ML IJ SOLN
INTRAMUSCULAR | Status: AC
  Filled 2012-01-05: qty 2

## 2012-01-05 MED ORDER — FENTANYL CITRATE 0.05 MG/ML IJ SOLN
INTRAMUSCULAR | Status: AC
  Filled 2012-01-05: qty 2

## 2012-01-05 MED ORDER — OXYCODONE HCL 5 MG PO CAPS
5.0000 mg | ORAL_CAPSULE | Freq: Four times a day (QID) | ORAL | Status: DC | PRN
  Administered 2012-01-05: 5 mg via ORAL

## 2012-01-05 MED ORDER — SODIUM CHLORIDE 0.9 % IJ SOLN: 3.0000 mL | INTRAMUSCULAR | Status: DC | PRN

## 2012-01-05 MED ORDER — FENTANYL CITRATE 0.05 MG/ML IJ SOLN
50.0000 ug | Freq: Once | INTRAMUSCULAR | Status: AC
  Administered 2012-01-05: 50 ug via INTRAVENOUS

## 2012-01-05 MED ORDER — ALPRAZOLAM 0.25 MG PO TABS
0.5000 mg | ORAL_TABLET | Freq: Every evening | ORAL | Status: DC | PRN
  Administered 2012-01-06: 0.5 mg via ORAL
  Filled 2012-01-05 (×2): qty 1

## 2012-01-05 MED ORDER — SODIUM CHLORIDE 0.9 % IJ SOLN: 3.0000 mL | Freq: Two times a day (BID) | INTRAMUSCULAR | Status: DC

## 2012-01-05 MED ORDER — HEPARIN (PORCINE) IN NACL 2-0.9 UNIT/ML-% IJ SOLN
INTRAMUSCULAR | Status: AC
  Filled 2012-01-05: qty 1000

## 2012-01-05 MED ORDER — SODIUM CHLORIDE 0.9 % IV SOLN
INTRAVENOUS | Status: AC
  Administered 2012-01-05: 16:00:00 via INTRAVENOUS

## 2012-01-05 MED ORDER — MORPHINE SULFATE 4 MG/ML IJ SOLN
INTRAMUSCULAR | Status: AC
  Filled 2012-01-05: qty 1

## 2012-01-05 MED ORDER — HYDRALAZINE HCL 100 MG PO TABS: 100.0000 mg | ORAL_TABLET | Freq: Three times a day (TID) | ORAL | Status: DC

## 2012-01-05 MED ORDER — ONDANSETRON HCL 4 MG/2ML IJ SOLN: 4.0000 mg | Freq: Four times a day (QID) | INTRAMUSCULAR | Status: DC | PRN

## 2012-01-05 MED ORDER — CITALOPRAM HYDROBROMIDE 10 MG PO TABS
10.0000 mg | ORAL_TABLET | Freq: Every day | ORAL | Status: DC
  Administered 2012-01-06: 10 mg via ORAL
  Filled 2012-01-05: qty 1

## 2012-01-05 MED ORDER — LABETALOL HCL 5 MG/ML IV SOLN
INTRAVENOUS | Status: AC
  Filled 2012-01-05: qty 4

## 2012-01-05 MED ORDER — LABETALOL HCL 5 MG/ML IV SOLN
10.0000 mg | INTRAVENOUS | Status: DC | PRN
  Administered 2012-01-05: 10 mg via INTRAVENOUS
  Filled 2012-01-05: qty 4

## 2012-01-05 MED ORDER — TIOTROPIUM BROMIDE MONOHYDRATE 18 MCG IN CAPS
18.0000 ug | ORAL_CAPSULE | Freq: Every day | RESPIRATORY_TRACT | Status: DC
  Filled 2012-01-05: qty 5

## 2012-01-05 MED ORDER — CALCIUM CARBONATE ANTACID 500 MG PO CHEW
1.0000 | CHEWABLE_TABLET | Freq: Two times a day (BID) | ORAL | Status: DC | PRN
  Filled 2012-01-05: qty 1

## 2012-01-05 MED ORDER — SODIUM CHLORIDE 0.9 % IV SOLN
INTRAVENOUS | Status: DC
  Administered 2012-01-05: 08:00:00 via INTRAVENOUS

## 2012-01-05 MED ORDER — OXYCODONE HCL 5 MG PO TABS
5.0000 mg | ORAL_TABLET | Freq: Four times a day (QID) | ORAL | Status: DC | PRN
  Administered 2012-01-05 – 2012-01-06 (×2): 5 mg via ORAL
  Filled 2012-01-05 (×2): qty 1

## 2012-01-05 MED ORDER — ACETAMINOPHEN 325 MG PO TABS: 650.0000 mg | ORAL_TABLET | ORAL | Status: DC | PRN

## 2012-01-05 MED ORDER — CLOPIDOGREL BISULFATE 300 MG PO TABS
300.0000 mg | ORAL_TABLET | Freq: Once | ORAL | Status: DC
  Filled 2012-01-05: qty 1

## 2012-01-05 MED ORDER — OXYCODONE HCL 5 MG PO TABS
ORAL_TABLET | ORAL | Status: AC
  Filled 2012-01-05: qty 1

## 2012-01-05 MED ORDER — CARVEDILOL 25 MG PO TABS
25.0000 mg | ORAL_TABLET | Freq: Two times a day (BID) | ORAL | Status: DC
  Administered 2012-01-05 – 2012-01-06 (×2): 25 mg via ORAL
  Filled 2012-01-05 (×4): qty 1

## 2012-01-05 MED ORDER — ALBUTEROL SULFATE (5 MG/ML) 0.5% IN NEBU
2.5000 mg | INHALATION_SOLUTION | RESPIRATORY_TRACT | Status: DC | PRN
  Administered 2012-01-06: 03:00:00 2.5 mg via RESPIRATORY_TRACT
  Filled 2012-01-05: qty 0.5

## 2012-01-05 MED ORDER — CLOPIDOGREL BISULFATE 75 MG PO TABS
75.0000 mg | ORAL_TABLET | Freq: Every day | ORAL | Status: DC
  Administered 2012-01-06: 10:00:00 75 mg via ORAL
  Filled 2012-01-05: qty 1

## 2012-01-05 MED ORDER — ALBUTEROL SULFATE HFA 108 (90 BASE) MCG/ACT IN AERS: 2.0000 | INHALATION_SPRAY | Freq: Four times a day (QID) | RESPIRATORY_TRACT | Status: DC | PRN

## 2012-01-05 MED ORDER — HEPARIN SODIUM (PORCINE) 1000 UNIT/ML IJ SOLN
INTRAMUSCULAR | Status: AC
  Filled 2012-01-05: qty 1

## 2012-01-05 MED ORDER — SODIUM CHLORIDE 0.9 % IV SOLN: 250.0000 mL | INTRAVENOUS | Status: DC | PRN

## 2012-01-05 MED ORDER — LIDOCAINE HCL (PF) 1 % IJ SOLN
INTRAMUSCULAR | Status: AC
  Filled 2012-01-05: qty 30

## 2012-01-05 MED ORDER — CLOPIDOGREL BISULFATE 300 MG PO TABS
ORAL_TABLET | ORAL | Status: AC
  Filled 2012-01-05: qty 1

## 2012-01-05 MED ORDER — GABAPENTIN 100 MG PO CAPS
100.0000 mg | ORAL_CAPSULE | Freq: Three times a day (TID) | ORAL | Status: DC
  Administered 2012-01-05 – 2012-01-06 (×3): 100 mg via ORAL
  Filled 2012-01-05 (×6): qty 1

## 2012-01-05 MED ORDER — MORPHINE SULFATE 4 MG/ML IJ SOLN
4.0000 mg | INTRAMUSCULAR | Status: DC | PRN
Start: 2012-01-05 — End: 2012-01-06
  Administered 2012-01-05 (×2): 4 mg via INTRAVENOUS
  Filled 2012-01-05: qty 1

## 2012-01-05 NOTE — Interval H&P Note (Signed)
History and Physical Interval Note:  01/05/2012 11:25 AM  [name]  has presented today for surgery, with the diagnosis of PVD  The various methods of treatment have been discussed with the patient and family. After consideration of risks, benefits and other options for treatment, the patient has consented to  Procedure(s) (LRB): ABDOMINAL AORTAGRAM (N/A) RENAL ANGIOGRAM (N/A) as a surgical intervention .  The patient's history has been reviewed, patient examined, no change in status, stable for surgery.  I have reviewed the patient's chart and labs.  Questions were answered to the patient's satisfaction.     Lorine Bears

## 2012-01-05 NOTE — H&P (View-Only) (Signed)
HPI [name] is a 51 y.o. gentlemen who was referred by Dr. Caryn Section and Dr. [name] for evaluation and management of renal artery stenosis. The patient has history of COPD, systolic HF with EF 30%, chronic kidney disease, history of tobacco, OSA (CPAP), and alcohol abuse. L/RHCtheterization on 04/08/11. RA 18, RV mean 21, PA mean 57, PCW 38 with V 50, Fick Cardiac output/index 2.9/1.5. PVR 6.6 Woods (although felt overestimated due to MR). Normal coronaries. He was admitted for cardiogenic shock and placed on milrinone.  In November of 2012 his creatinine was 3.77. Since then, he had fluctuations in renal function with worse creatinine of 4.23 in May of this year. Most recently in June his creatinine was 3.09. The patient had renal artery duplex ultrasound which showed absence of flow in the proximal and mid right renal artery with significant left renal artery stenosis greater than 60%. He had an MRA done which showed that the patient has 3 left renal arteries and 2 right renal arteries. There was significant stenosis in 3 of these. The patient was referred to consider revascularization.  No Known Allergies   Current Outpatient Prescriptions on File Prior to Visit  Medication Sig Dispense Refill  . bisoprolol (ZEBETA) 5 MG tablet Take 10 mg by mouth daily.       . calcium carbonate (TUMS - DOSED IN MG ELEMENTAL CALCIUM) 500 MG chewable tablet Chew 1 tablet by mouth 2 (two) times daily as needed. For heartburn       . carvedilol (COREG) 25 MG tablet Take 25 mg by mouth 2 (two) times daily with a meal.      . cloNIDine (CATAPRES) 0.1 MG tablet Take 0.15 mg by mouth 2 (two) times daily.       Marland Kitchen gabapentin (NEURONTIN) 100 MG capsule Take 100 mg by mouth 3 (three) times daily.      . hydrALAZINE (APRESOLINE) 100 MG tablet Take 100 mg by mouth 3 (three) times daily.       Marland Kitchen oxycodone (OXY-IR) 5 MG capsule Take 5 mg by mouth every 6 (six) hours as needed. For pain.      Marland Kitchen tiotropium (SPIRIVA) 18 MCG  inhalation capsule Place 18 mcg into inhaler and inhale daily.         Past Medical History  Diagnosis Date  . Hypertension   . Systolic heart failure     EF 16-10%  . Chronic kidney disease (CKD), stage III (moderate)     baseline cr 2.3  . Hyperlipidemia   . COPD (chronic obstructive pulmonary disease)   . Peripheral vascular disease   . ETOH abuse   . Renal cyst     left, complex     No past surgical history on file.   Family History  Problem Relation Age of Onset  . Brain cancer Mother   . Pancreatic cancer Father   . Heart failure Paternal Uncle   . Heart disease Paternal Uncle   . Heart failure Paternal Grandfather   . Heart disease Paternal Grandfather      History   Social History  . Marital Status: Widowed    Spouse Name: N/A    Number of Children: N/A  . Years of Education: N/A   Occupational History  . Not on file.   Social History Main Topics  . Smoking status: Former Smoker -- 1.0 packs/day for 20 years    Types: Cigarettes    Quit date: 06/10/2010  . Smokeless tobacco: Current User  Types: Snuff, Chew   Comment: 1 to 2 times a week  . Alcohol Use: No     quit drinking in Feb 2012  . Drug Use: No  . Sexually Active: Not on file   Other Topics Concern  . Not on file   Social History Narrative  . No narrative on file     ROS Constitutional: Negative for fever, chills, diaphoresis, activity change, appetite change and fatigue.  HENT: Negative for hearing loss, nosebleeds, congestion, sore throat, facial swelling, drooling, trouble swallowing, neck pain, voice change, sinus pressure and tinnitus.  Eyes: Negative for photophobia, pain, discharge and visual disturbance.  Respiratory: Negative for apnea, cough and wheezing.  Cardiovascular: Negative for palpitations and leg swelling.  Gastrointestinal: Negative for nausea, vomiting, abdominal pain, diarrhea, constipation, blood in stool and abdominal distention.  Genitourinary: Negative  for dysuria, urgency, frequency, hematuria and decreased urine volume.  Musculoskeletal: Negative for myalgias, back pain, joint swelling, arthralgias and gait problem.  Skin: Negative for color change, pallor, rash and wound.  Neurological: Negative for dizziness, tremors, seizures, syncope, speech difficulty, weakness, light-headedness, numbness and headaches.  Psychiatric/Behavioral: Negative for suicidal ideas, hallucinations, behavioral problems and agitation. The patient is not nervous/anxious.     PHYSICAL EXAM   BP 148/86  Pulse 73  Ht 5\' 7"  (1.702 m)  Wt 161 lb 1.9 oz (73.084 kg)  BMI 25.24 kg/m2 Constitutional: He is oriented to person, place, and time. He appears well-developed and well-nourished. No distress.  HENT: No nasal discharge.  Head: Normocephalic and atraumatic.  Eyes: Pupils are equal and round. Right eye exhibits no discharge. Left eye exhibits no discharge.  Neck: Normal range of motion. Neck supple. No JVD present. No thyromegaly present.  Cardiovascular: Normal rate, regular rhythm, normal heart sounds and. Exam reveals no gallop and no friction rub. No murmur heard.  Pulmonary/Chest: Effort normal and breath sounds normal. No stridor. No respiratory distress. He has no wheezes. He has no rales. He exhibits no tenderness.  Abdominal: Soft. Bowel sounds are normal. He exhibits no distension. There is no tenderness. There is no rebound and no guarding.  Musculoskeletal: Normal range of motion. He exhibits no edema and no tenderness.  Neurological: He is alert and oriented to person, place, and time. Coordination normal.  Skin: Skin is warm and dry. No rash noted. He is not diaphoretic. No erythema. No pallor.  Psychiatric: He has a normal mood and affect. His behavior is normal. Judgment and thought content normal.  Vascular: No abdominal bruits. Femoral pulses are normal.     EKG: Normal sinus rhythm with no significant ST or T wave changes.   ASSESSMENT  AND PLAN

## 2012-01-05 NOTE — Progress Notes (Signed)
Utilization Review Completed.Lieutenant Abarca T8/28/2013   

## 2012-01-05 NOTE — CV Procedure (Signed)
     PERIPHERAL VASCULAR PROCEDURE  NAME:  [name]   MRN: [id number] DOB:  14-Oct-1960   ADMIT DATE: 01/05/2012  Performing Cardiologist: Lorine Bears Primary Physician: Junious Dresser, MD Primary Cardiologist:  Nicholes Mango, MD Primary nephrologist: Dr. Caryn Section.   Procedures Performed: 1. Abdominal Aortic Angiogram 2. Selective right renal artery angiography.   3. Stenting of the inferior right renal artery.  Indication(s): renovascular hypertension with chronic kidney disease and known renal artery stenosis.  Consent: The procedure with Risks/Benefits/Alternatives and Indications was reviewed with the patient .  All questions were answered.  Medications:  Sedation:   1  mg IV Versed,  25  mcg IV Fentanyl  Contrast:   30 mL of Visipaque    Procedural details: The  right  groin was prepped, draped, and anesthetized with 1% lidocaine. Using modified Seldinger technique, a  5  French sheath was introduced into the  femoral artery. A 5 Fr Short Pigtail Catheter was advanced of over a  Versicore wire into the descending Aorta to a level just above the renal arteries. 2 manual injections were performed with CO2. One manual injection was performed with contrast.   Interventional Procedure:   the 5 French sheath was exchanged into a 6 French sheath. 4,000 units of unfractionated heparin was given. A CT was only 188. Thus, an additional 2000 units of heparin was given .  A 6 [place] JR renal guide was used. It engaged the inferior right renal artery and was occlusive. The lesion was wired with a [place] core wire. Direct stenting was performed with a 4.0 x 15 mm Herculink stent which was deployed to 14 atmospheres. The balloon was retracted and another inflation was performed at the ostium. Final angiography showed excellent results with no residual stenosis. The stent was at least 2-3 mm in the aorta. The wire was removed as well as the guiding catheter. The sheath will be removed manually.     Hemodynamics:  Central Aortic Pressure / Mean Aortic Pressure:  186/94   Findings:  Abdominal aorta:  normal in size with no evidence of aortic aneurysm. The aorta has diffuse atherosclerosis.  Left renal artery:  it appears that are there are 3 left renal arteries. The superior branch is medium in size with minor irregularities. The 2 inferior branches are tiny with likely subtotal occlusion.   Right renal artery:  There are 2 renal arteries on the right side. The superior branch is larger and free of significant disease. The inferior branch has 99% ostial stenosis.  Conclusions: 1. Significant renal artery stenosis. On the left side, there are 3 renal arteries. Only the superior branch appears to be patent with minor irregularities. On the right side there was severe 99% stenosis in the inferior branch. 2. Successful stenting of the inferior right renal artery.   Recommendations:   I recommend Plavix for one month. The patient will be admitted overnight for hydration. A total of 30 mL of contrast was used. Check renal function in the morning. No revascularization is advised to the left renal arteries which seem to be subtotally occluded and very small in diameter.    Lorine Bears, MD, [facility] 01/05/2012 12:26 PM

## 2012-01-06 ENCOUNTER — Encounter (HOSPITAL_COMMUNITY): Payer: Self-pay | Admitting: Nurse Practitioner

## 2012-01-06 DIAGNOSIS — I701 Atherosclerosis of renal artery: Secondary | ICD-10-CM

## 2012-01-06 LAB — CBC
Hemoglobin: 11.2 g/dL — ABNORMAL LOW (ref 13.0–17.0)
MCH: 30.3 pg (ref 26.0–34.0)
MCV: 90 fL (ref 78.0–100.0)
Platelets: 232 10*3/uL (ref 150–400)
RBC: 3.7 MIL/uL — ABNORMAL LOW (ref 4.22–5.81)
WBC: 8.7 10*3/uL (ref 4.0–10.5)

## 2012-01-06 LAB — BASIC METABOLIC PANEL WITH GFR
BUN: 37 mg/dL — ABNORMAL HIGH (ref 6–23)
CO2: 23 meq/L (ref 19–32)
Calcium: 8.7 mg/dL (ref 8.4–10.5)
Chloride: 101 meq/L (ref 96–112)
Creatinine, Ser: 2.76 mg/dL — ABNORMAL HIGH (ref 0.50–1.35)
GFR calc Af Amer: 29 mL/min — ABNORMAL LOW
GFR calc non Af Amer: 25 mL/min — ABNORMAL LOW
Glucose, Bld: 119 mg/dL — ABNORMAL HIGH (ref 70–99)
Potassium: 3.7 meq/L (ref 3.5–5.1)
Sodium: 135 meq/L (ref 135–145)

## 2012-01-06 MED ORDER — CLOPIDOGREL BISULFATE 75 MG PO TABS: 75.0000 mg | ORAL_TABLET | Freq: Every day | ORAL | Status: DC

## 2012-01-06 NOTE — Plan of Care (Signed)
Problem: Consults Goal: Tobacco Cessation referral if indicated Outcome: Completed/Met Date Met:  01/06/12 NT found quart bottle of Natural Ice beer in patient's shower approx 1/4 full.  NT left it untouched and showed RN.  Removed bottle from room, disposed of in trash can, and advised pt it is against hospital policy and unsafe to have alcohol here.  Pt states it is not his and he "didn't know my boy brought it".  Pt's son has been in and out of room all night, going outside to smoke frequently.  Pt states he quit drinking and smoking March 2012.  No alcohol smelled on patient but room smells strongly of old cigarette smoke.  Pt shows no signs of intoxication.  Congratulated patient on quitting and encouraged him to avoid second hand smoke and to remind son of policy.

## 2012-01-06 NOTE — Progress Notes (Addendum)
Patient Name: [name] Date of Encounter: 01/06/2012   Principal Problem:  *Renal artery stenosis Active Problems:  Chronic kidney disease (CKD), stage III (moderate)  Hypertension  Hyperlipidemia  COPD (chronic obstructive pulmonary disease)  Peripheral vascular disease  Chronic systolic heart failure  OSA (obstructive sleep apnea)   SUBJECTIVE  Feels well.  Ready to go home.  No groin complaints.   Renal fxn stable.  CURRENT MEDS    . amLODipine  10 mg Oral Daily  . carvedilol  25 mg Oral BID WC  . citalopram  10 mg Oral Daily  . cloNIDine  0.15 mg Oral BID  . clopidogrel  300 mg Oral Once  . clopidogrel  75 mg Oral Q breakfast  . fentaNYL  50 mcg Intravenous Once  . gabapentin  100 mg Oral TID  . hydrALAZINE  100 mg Oral Q8H  . isosorbide mononitrate  30 mg Oral BID  . tiotropium  18 mcg Inhalation Daily  . DISCONTD: hydrALAZINE  100 mg Oral Q8H  . DISCONTD: sodium chloride  3 mL Intravenous Q12H   OBJECTIVE  Filed Vitals:   01/06/12 0319 01/06/12 0500 01/06/12 0510 01/06/12 0817  BP:  177/85 165/99 161/90  Pulse:   70 76  Temp:  97.9 F (36.6 C)  97.7 F (36.5 C)  TempSrc:  Oral  Oral  Resp:  12 10 15   Height:      Weight:  165 lb 12.6 oz (75.2 kg)    SpO2: 97% 97%  97%    Intake/Output Summary (Last 24 hours) at 01/06/12 1225 Last data filed at 01/06/12 0600  Gross per 24 hour  Intake 1567.5 ml  Output   1300 ml  Net  267.5 ml   Filed Weights   01/05/12 0733 01/06/12 0500  Weight: 160 lb (72.576 kg) 165 lb 12.6 oz (75.2 kg)   PHYSICAL EXAM  General: Pleasant, NAD. Neuro: Alert and oriented X 3. Moves all extremities spontaneously. Psych: Normal affect. HEENT:  Normal  Neck: Supple without bruits or JVD. Lungs:  Resp regular and unlabored, CTA. Heart: RRR no s3, s4, or murmurs. Abdomen: Soft, non-tender, non-distended, BS + x 4.  Extremities: No clubbing, cyanosis or edema. DP/PT/Radials 2+ and equal bilaterally.  No fem bruits,  hematoma, bleeding.  Accessory Clinical Findings  CBC  Basename 01/06/12 0523  WBC 8.7  NEUTROABS --  HGB 11.2*  HCT 33.3*  MCV 90.0  PLT 232   Basic Metabolic Panel  Basename 01/06/12 0523 01/05/12 0737  NA 135 138  K 3.7 4.1  CL 101 99  CO2 23 20  GLUCOSE 119* 101*  BUN 37* 40*  CREATININE 2.76* 3.39*  CALCIUM 8.7 9.3  MG -- --  PHOS -- --   TELE  rsr  ASSESSMENT AND PLAN  1.  Bilateral RAS:  S/p PTA and stenting of the inferior branch of the R Renal Artery.  Creat slightly improved.  Cont asa, plavix (30 days).  ? Not on statin.  2.  Chronic syst chf/NICM:  Volume looks good.  I/O +267.  Wt listed as being up 5 lbs but this is not reflected on exam - ? Accuracy.  Cont bb, hydral/ntg.  3.  CKD Stage IV: Improved post cath.  F/U bmet as an outpt.  4.  HTN:  bp up this am.  Cont home meds.  F/u as outpt as he may drop following RAS stenting.  Signed, Nicolasa Ducking NP  Patient seen, examined. Available data  reviewed. Agree with findings, assessment, and plan as outlined by Ward Givens NP. He is s/p renal PTA and stenting by Dr Kirke Corin. Groin site looks good and he will follow-up in one month. Continued follow-up with Dr Caryn Section in nephrology.  Tonny Bollman, M.D. 01/06/2012 1:27 PM

## 2012-01-06 NOTE — Discharge Summary (Signed)
Patient ID: [name],  MRN: [id number], DOB/AGE: 1960/12/06 50 y.o.  Admit date: 01/05/2012 Discharge date: 01/06/2012  Primary Care Provider: CAMPBELL,STEPHEN Primary Cardiologist: D. Bensimhon, MD/M. Kirke Corin, MD  Discharge Diagnoses Principal Problem:  *Renal artery stenosis  **s/p stenting of the inferior branch of the R Renal Artery this admission. Active Problems:  Chronic kidney disease (CKD), stage III (moderate)  Hypertension  Hyperlipidemia  COPD (chronic obstructive pulmonary disease)  Peripheral vascular disease  Chronic systolic heart failure  OSA (obstructive sleep apnea)  NICM/Chronic systolic CHF  Allergies No Known Allergies  Procedures  Renal Artery Angiography  Hemodynamics:             Central Aortic Pressure / Mean Aortic Pressure:  186/94   Findings:  Abdominal aorta:  normal in size with no evidence of aortic aneurysm. The aorta has diffuse atherosclerosis.   Left renal artery:  it appears that are there are 3 left renal arteries. The superior branch is medium in size with minor irregularities. The 2 inferior branches are tiny with likely subtotal occlusion.     Right renal artery:  There are 2 renal arteries on the right side. The superior branch is larger and free of significant disease. The inferior branch has 99% ostial stenosis.  Conclusions: 1. Significant renal artery stenosis. On the left side, there are 3 renal arteries. Only the superior branch appears to be patent with minor irregularities. On the right side there was severe 99% stenosis in the inferior branch. 2. Successful stenting of the inferior right renal artery with a 4.0 x 15mm Herculink BMS. _____________  History of Present Illness  51 y/o male with the above problem list.  He was recently seen in PV clinic secondary to worsening renal function and recent finding of RAS noted on renal duplex.  Decision was made to pursue renal angiography.  Hospital Course  Pt presented to  the [facility] EP lab.  He underwent renal angiography with findings as outlined above.  He then underwent successful stenting of the inferior branch of the R renal artery.  He tolerated this procedure well and post-procedure creatinine is stable.  He will be discharged home today in good condition.   Discharge Vitals Blood pressure 161/90, pulse 76, temperature 97.7 F (36.5 C), temperature source Oral, resp. rate 15, height 5\' 7"  (1.702 m), weight 165 lb 12.6 oz (75.2 kg), SpO2 97.00%.  Filed Weights   01/05/12 0733 01/06/12 0500  Weight: 160 lb (72.576 kg) 165 lb 12.6 oz (75.2 kg)   Labs  CBC  Basename 01/06/12 0523  WBC 8.7  NEUTROABS --  HGB 11.2*  HCT 33.3*  MCV 90.0  PLT 232   Basic Metabolic Panel  Basename 01/06/12 0523 01/05/12 0737  NA 135 138  K 3.7 4.1  CL 101 99  CO2 23 20  GLUCOSE 119* 101*  BUN 37* 40*  CREATININE 2.76* 3.39*  CALCIUM 8.7 9.3  MG -- --  PHOS -- --   Disposition  Pt is being discharged home today in good condition.  Follow-up Plans & Appointments  Follow-up Information    Follow up with Lorine Bears, MD on 02/02/2012. (2:00)    Contact information:   [address] [place] [address] [phone]         Discharge Medications  Medication List  As of 01/06/2012  2:13 PM   STOP taking these medications         bisoprolol 5 MG tablet  TAKE these medications         albuterol 108 (90 BASE) MCG/ACT inhaler   Commonly known as: PROVENTIL HFA;VENTOLIN HFA   Inhale 2 puffs into the lungs every 6 (six) hours as needed. For wheezing.      albuterol (2.5 MG/3ML) 0.083% nebulizer solution   Commonly known as: PROVENTIL   Take 2.5 mg by nebulization every 6 (six) hours as needed. For wheezing.      ALPRAZolam 0.5 MG tablet   Commonly known as: XANAX   Take 0.5 mg by mouth at bedtime as needed. For anxiety/sleep.      amLODipine 10 MG tablet   Commonly known as: NORVASC   Take 10 mg by mouth  daily.      calcium carbonate 500 MG chewable tablet   Commonly known as: TUMS - dosed in mg elemental calcium   Chew 1 tablet by mouth 2 (two) times daily as needed. For heartburn      carvedilol 25 MG tablet   Commonly known as: COREG   Take 25 mg by mouth 2 (two) times daily with a meal.      citalopram 10 MG tablet   Commonly known as: CELEXA   Take 10 mg by mouth daily.      cloNIDine 0.1 MG tablet   Commonly known as: CATAPRES   Take 0.15 mg by mouth 2 (two) times daily.      clopidogrel 75 MG tablet   Commonly known as: PLAVIX   Take 1 tablet (75 mg total) by mouth daily with breakfast.      furosemide 80 MG tablet   Commonly known as: LASIX   Take 80 mg by mouth as needed. For weight greater than 168.      gabapentin 100 MG capsule   Commonly known as: NEURONTIN   Take 100 mg by mouth 3 (three) times daily.      hydrALAZINE 100 MG tablet   Commonly known as: APRESOLINE   Take 100 mg by mouth 3 (three) times daily.      isosorbide mononitrate 30 MG 24 hr tablet   Commonly known as: IMDUR   Take 30 mg by mouth 2 (two) times daily.      oxycodone 5 MG capsule   Commonly known as: OXY-IR   Take 5 mg by mouth every 6 (six) hours as needed. For pain.      tiotropium 18 MCG inhalation capsule   Commonly known as: SPIRIVA   Place 18 mcg into inhaler and inhale daily.           Outstanding Labs/Studies  rec f/u renal artery duplex in 6 mos.  Duration of Discharge Encounter   Greater than 30 minutes including physician time.  Signed, Nicolasa Ducking NP 01/06/2012, 2:13 PM

## 2012-01-07 ENCOUNTER — Telehealth: Payer: Self-pay | Admitting: *Deleted

## 2012-01-07 NOTE — Telephone Encounter (Signed)
Message copied by Deveron Furlong on Fri Jan 07, 2012  9:31 AM ------      Message from: Lorine Bears A      Created: Fri Jan 07, 2012  8:55 AM      Regarding: labs       I want him to have BMP done next week on Monday to make sure renal function is stable after procedure.

## 2012-01-07 NOTE — Telephone Encounter (Signed)
That's fine

## 2012-01-07 NOTE — Telephone Encounter (Signed)
Dr Kirke Corin --called mr revoir and advised about needing lab work next Monday--per pt --he normally goes to [facility] in [place] for labs and he cannot go Monday or Tuesday due to previous appoint., but will go for bmp on Wednesday in [place]

## 2012-01-08 NOTE — Assessment & Plan Note (Addendum)
Much improved. EF recovered. Volume status looks good. BP meds just titrated by Dr. Caryn Section. Will continue.

## 2012-01-08 NOTE — Addendum Note (Signed)
Encounter addended by: Dolores Patty, MD on: 01/08/2012  6:27 PM<BR>     Documentation filed: Notes Section, Follow-up Section, LOS Section

## 2012-01-12 ENCOUNTER — Encounter: Payer: Self-pay | Admitting: Cardiovascular Disease

## 2012-01-26 ENCOUNTER — Encounter (INDEPENDENT_AMBULATORY_CARE_PROVIDER_SITE_OTHER): Payer: Medicaid Other

## 2012-01-26 DIAGNOSIS — I10 Essential (primary) hypertension: Secondary | ICD-10-CM

## 2012-01-26 DIAGNOSIS — N189 Chronic kidney disease, unspecified: Secondary | ICD-10-CM

## 2012-02-02 ENCOUNTER — Ambulatory Visit (INDEPENDENT_AMBULATORY_CARE_PROVIDER_SITE_OTHER): Payer: Medicaid Other | Admitting: Cardiovascular Disease

## 2012-02-02 ENCOUNTER — Encounter: Payer: Self-pay | Admitting: Cardiovascular Disease

## 2012-02-02 VITALS — BP 140/84 | HR 65 | Ht 67.0 in | Wt 161.0 lb

## 2012-02-02 DIAGNOSIS — I739 Peripheral vascular disease, unspecified: Secondary | ICD-10-CM

## 2012-02-02 DIAGNOSIS — I701 Atherosclerosis of renal artery: Secondary | ICD-10-CM

## 2012-02-02 NOTE — Patient Instructions (Addendum)
Your physician wants you to follow-up in: 6 months, one week after your renal artery duplex.   You will receive a reminder letter in the mail two months in advance. If you don't receive a letter, please call our office to schedule the follow-up appointment.  Your physician has requested that you have a renal artery duplex. During this test, an ultrasound is used to evaluate blood flow to the kidneys. Allow one hour for this exam. Do not eat after midnight the day before and avoid carbonated beverages. Take your medications as you usually do.  Your physician has recommended you make the following change in your medication: STOP your Plavix when you run out

## 2012-02-02 NOTE — Assessment & Plan Note (Signed)
Status post stenting of the inferior right renal artery. Slight improvement in renal function since then. Blood pressure is reasonably controlled. He can stop Plavix after a month of treatment. He had renal artery duplex ultrasound which was overall a difficult study but did show patent arteries. I will plan to repeat this in 6 months and have him followup with me after that. Due to his chronic kidney disease, there are no other good alternatives to duplex ultrasound in terms of following the patency of the stent.

## 2012-02-02 NOTE — Progress Notes (Signed)
HPI  This is a 51 year old man who is here today for a followup visit. He was seen recently for renal artery stenosis with chronic kidney disease. He underwent renal angiography which showed 2 renal arteries on the right side with 99% stenosis in the inferior one. And the left side there were 3 renal arteries. The superior branch was the largest that was patent. The other 2 were small in size and subtotally occluded. He underwent a successful stent placement to the right renal artery without complications. The procedure was performed with small amount of contrast. His creatinine before the procedure was slightly above 3. Shortly after the procedure it went down to 2.76. It was checked 10 days later and it was 2.64. The patient is overall feeling well. He denies chest pain or dyspnea.  No Known Allergies   Current Outpatient Prescriptions on File Prior to Visit  Medication Sig Dispense Refill  . albuterol (PROVENTIL HFA;VENTOLIN HFA) 108 (90 BASE) MCG/ACT inhaler Inhale 2 puffs into the lungs every 6 (six) hours as needed. For wheezing.      Marland Kitchen albuterol (PROVENTIL) (2.5 MG/3ML) 0.083% nebulizer solution Take 2.5 mg by nebulization every 6 (six) hours as needed. For wheezing.      Marland Kitchen ALPRAZolam (XANAX) 0.5 MG tablet Take 0.5 mg by mouth at bedtime as needed. For anxiety/sleep.      Marland Kitchen amLODipine (NORVASC) 10 MG tablet Take 10 mg by mouth daily.      . calcium carbonate (TUMS - DOSED IN MG ELEMENTAL CALCIUM) 500 MG chewable tablet Chew 1 tablet by mouth 2 (two) times daily as needed. For heartburn       . carvedilol (COREG) 25 MG tablet Take 25 mg by mouth 2 (two) times daily with a meal.      . citalopram (CELEXA) 10 MG tablet Take 10 mg by mouth daily.      . cloNIDine (CATAPRES) 0.1 MG tablet Take 0.15 mg by mouth 2 (two) times daily.       . furosemide (LASIX) 80 MG tablet Take 80 mg by mouth as needed. For weight greater than 168.      . gabapentin (NEURONTIN) 100 MG capsule Take 100 mg by  mouth 3 (three) times daily.      . hydrALAZINE (APRESOLINE) 100 MG tablet Take 100 mg by mouth 3 (three) times daily.       . isosorbide mononitrate (IMDUR) 30 MG 24 hr tablet Take 30 mg by mouth 2 (two) times daily.      Marland Kitchen oxycodone (OXY-IR) 5 MG capsule Take 5 mg by mouth every 6 (six) hours as needed. For pain.      Marland Kitchen tiotropium (SPIRIVA) 18 MCG inhalation capsule Place 18 mcg into inhaler and inhale daily.         Past Medical History  Diagnosis Date  . Hypertension   . Systolic heart failure     EF 56-21%  . Hyperlipidemia   . COPD (chronic obstructive pulmonary disease)   . Peripheral vascular disease   . Pneumonia 1980's  . OSA on CPAP   . Anginal pain   . ETOH abuse   . History of stomach ulcers   . Arthritis     "arms"  . Chronic lower back pain   . Anxiety   . Depression   . Adult ADHD     "never diagnosed; my son was; I think I've got it too"  . Chronic kidney disease (CKD), stage III (moderate)  baseline cr 2.3  . Renal cyst     left, complex  . Renal artery stenosis     a. s/p BMS to inferior branch of right renal artery 12/2011. On left, 3 renal arteries were noted, 2 of them were very small in size and subtotally occluded     Past Surgical History  Procedure Date  . Renal artery stent 01/05/2012    right inferior     Family History  Problem Relation Age of Onset  . Brain cancer Mother   . Pancreatic cancer Father   . Heart failure Paternal Uncle   . Heart disease Paternal Uncle   . Heart failure Paternal Grandfather   . Heart disease Paternal Grandfather      History   Social History  . Marital Status: Widowed    Spouse Name: N/A    Number of Children: N/A  . Years of Education: N/A   Occupational History  . Not on file.   Social History Main Topics  . Smoking status: Former Smoker -- 1.0 packs/day for 20 years    Types: Cigarettes    Quit date: 07/09/2010  . Smokeless tobacco: Current User    Types: Snuff, Chew   Comment:  01/05/2012 offered cessation materials; pt declines; I only use 1 to 2 times a week; when I get urge to smoke"  . Alcohol Use: Yes     01/05/2012 "quit drinking in March 2012"  . Drug Use: No  . Sexually Active: Not Currently   Other Topics Concern  . Not on file   Social History Narrative  . No narrative on file     PHYSICAL EXAM   BP 140/84  Pulse 65  Ht 5\' 7"  (1.702 m)  Wt 161 lb (73.029 kg)  BMI 25.22 kg/m2 Constitutional: He is oriented to person, place, and time. He appears well-developed and well-nourished. No distress.  HENT: No nasal discharge.  Head: Normocephalic and atraumatic.  Eyes: Pupils are equal and round. Right eye exhibits no discharge. Left eye exhibits no discharge.  Neck: Normal range of motion. Neck supple. No JVD present. No thyromegaly present.  Cardiovascular: Normal rate, regular rhythm, normal heart sounds and. Exam reveals no gallop and no friction rub. No murmur heard.  Pulmonary/Chest: Effort normal and breath sounds normal. No stridor. No respiratory distress. He has no wheezes. He has no rales. He exhibits no tenderness.  Abdominal: Soft. Bowel sounds are normal. He exhibits no distension. There is no tenderness. There is no rebound and no guarding.  Musculoskeletal: Normal range of motion. He exhibits no edema and no tenderness.  Neurological: He is alert and oriented to person, place, and time. Coordination normal.  Skin: Skin is warm and dry. No rash noted. He is not diaphoretic. No erythema. No pallor.  Psychiatric: He has a normal mood and affect. His behavior is normal. Judgment and thought content normal.  Right groin: No hematoma or bruit.      ASSESSMENT AND PLAN

## 2012-04-13 ENCOUNTER — Ambulatory Visit: Payer: Medicaid Other | Admitting: Pulmonary Disease

## 2012-06-15 ENCOUNTER — Other Ambulatory Visit: Payer: Self-pay | Admitting: *Deleted

## 2012-06-15 DIAGNOSIS — Z01810 Encounter for preprocedural cardiovascular examination: Secondary | ICD-10-CM

## 2012-06-15 DIAGNOSIS — N184 Chronic kidney disease, stage 4 (severe): Secondary | ICD-10-CM

## 2012-06-19 ENCOUNTER — Other Ambulatory Visit (HOSPITAL_COMMUNITY): Payer: Self-pay | Admitting: Adult Health

## 2012-06-19 ENCOUNTER — Encounter: Payer: Self-pay | Admitting: Vascular Surgery

## 2012-06-20 ENCOUNTER — Ambulatory Visit: Payer: Medicaid Other | Admitting: Vascular Surgery

## 2012-07-03 ENCOUNTER — Telehealth (HOSPITAL_COMMUNITY): Payer: Self-pay | Admitting: Cardiology

## 2012-07-03 NOTE — Telephone Encounter (Signed)
Message copied by Divonte Senger, Milagros Reap on Mon Jul 03, 2012  4:36 PM ------      Message from: Noralee Space      Created: Mon Jun 19, 2012  1:31 PM       Hey can you bring him in for f/u please, we haven't seen him since the fall, thanks ------

## 2012-07-03 NOTE — Telephone Encounter (Signed)
Attempted to call regarding follow up. No answer and unable to leave message

## 2012-08-01 ENCOUNTER — Other Ambulatory Visit: Payer: Self-pay | Admitting: Pulmonary Disease

## 2012-08-08 ENCOUNTER — Ambulatory Visit: Payer: Medicaid Other | Admitting: Vascular Surgery

## 2012-08-29 ENCOUNTER — Ambulatory Visit (HOSPITAL_COMMUNITY)
Admission: RE | Admit: 2012-08-29 | Discharge: 2012-08-29 | Disposition: A | Discharge status: Home / Self Care | Payer: Medicaid Other | Source: Ambulatory Visit | Attending: Internal Medicine | Admitting: Internal Medicine

## 2012-08-29 ENCOUNTER — Encounter (HOSPITAL_COMMUNITY): Payer: Self-pay

## 2012-08-29 VITALS — BP 180/90 | HR 91 | Wt 168.0 lb

## 2012-08-29 DIAGNOSIS — G4733 Obstructive sleep apnea (adult) (pediatric): Secondary | ICD-10-CM | POA: Insufficient documentation

## 2012-08-29 DIAGNOSIS — I739 Peripheral vascular disease, unspecified: Secondary | ICD-10-CM | POA: Insufficient documentation

## 2012-08-29 DIAGNOSIS — Z8711 Personal history of peptic ulcer disease: Secondary | ICD-10-CM | POA: Insufficient documentation

## 2012-08-29 DIAGNOSIS — J4489 Other specified chronic obstructive pulmonary disease: Secondary | ICD-10-CM | POA: Insufficient documentation

## 2012-08-29 DIAGNOSIS — M129 Arthropathy, unspecified: Secondary | ICD-10-CM | POA: Insufficient documentation

## 2012-08-29 DIAGNOSIS — I5022 Chronic systolic (congestive) heart failure: Secondary | ICD-10-CM

## 2012-08-29 DIAGNOSIS — Q619 Cystic kidney disease, unspecified: Secondary | ICD-10-CM | POA: Insufficient documentation

## 2012-08-29 DIAGNOSIS — I5020 Unspecified systolic (congestive) heart failure: Secondary | ICD-10-CM | POA: Insufficient documentation

## 2012-08-29 DIAGNOSIS — M549 Dorsalgia, unspecified: Secondary | ICD-10-CM | POA: Insufficient documentation

## 2012-08-29 DIAGNOSIS — N183 Chronic kidney disease, stage 3 unspecified: Secondary | ICD-10-CM | POA: Insufficient documentation

## 2012-08-29 DIAGNOSIS — G8929 Other chronic pain: Secondary | ICD-10-CM | POA: Insufficient documentation

## 2012-08-29 DIAGNOSIS — I10 Essential (primary) hypertension: Secondary | ICD-10-CM

## 2012-08-29 DIAGNOSIS — J449 Chronic obstructive pulmonary disease, unspecified: Secondary | ICD-10-CM | POA: Insufficient documentation

## 2012-08-29 DIAGNOSIS — F17200 Nicotine dependence, unspecified, uncomplicated: Secondary | ICD-10-CM | POA: Insufficient documentation

## 2012-08-29 DIAGNOSIS — F1010 Alcohol abuse, uncomplicated: Secondary | ICD-10-CM | POA: Insufficient documentation

## 2012-08-29 MED ORDER — CLONIDINE HCL 0.1 MG PO TABS: 0.2000 mg | ORAL_TABLET | Freq: Two times a day (BID) | ORAL | Status: DC

## 2012-08-29 MED ORDER — FUROSEMIDE 80 MG PO TABS: 80.0000 mg | ORAL_TABLET | ORAL | Status: DC | PRN

## 2012-08-29 NOTE — Assessment & Plan Note (Signed)
Increase clonidine to 0.2 mg twice a day

## 2012-08-29 NOTE — Progress Notes (Signed)
Patient ID: [name], male   DOB: Mar 11, 1961, 52 y.o.   MRN: [id number] Primary Physician: Dr Orvan Falconer Nephoroligist: Dr Caryn Section Pulmonologist: Dr Craige Cotta  HPI: [name] is a 52 y.o. gentlemen with history of COPD, systolic HF with EF 30% (recovered to 55-60% in 7/13), CRI with baseline Cr 2.3, history of tobacco, OSA (CPAP),  and alcohol abuse.  L/RHCtheterization on 04/08/11.   RA 18, RV mean 21, PA mean 57, PCW 38 with V 50, Fick Cardiac output/index 2.9/1.5.  PVR 6.6 Woods (although felt overestimated due to MR).  Normal coronaries.  He was admitted for cardiogenic shock and placed on milrinone.  During hospitalization ABIs: Right 1.34, Left 1.18 and PFTs: FVC 64% predicted, FEV1 57% predicted, FEV1/FVC 88% predicted, FEF 25-75% 42% predicted, DLCO 61% predicted.  No significant response to bronchodilators.  Dr. Craige Cotta evaluated the patient and discontinued Spiriva and started combivent although he is awaiting aid from the [facility] clinic to afford this prescription.  D/C weight was 167 pounds.  09/24/11 Potassium 5.8 Creatinine 3.88 Sodium 134 12/08/11  Echo 55-60%  01/05/2012  S/P successful stenting R renal artery. Unable to complete revascularization to L renal artery.  Plan for renal ultrasound.   Admitted to [facility] 04/2012 for bleeding ulcers. Received multiple transfusions.   He returns for follow up. Has good days and bad days. Denies SOB/PND/Orthopnea. Sleeps in a recliner. Uses CPAP every night. SBP at home 130-140s/70-80s. Smokes 1/2 pack of cigarettes per day. Weight at home 167 pounds. Taking lasix as needed for ankle swelling about 3 times per week.   He will follow up with Nephrology today regarding possible fistula.   ROS: All pertinent positive/negatives in HPI otherwise negative    Past Medical History  Diagnosis Date  . Hypertension   . Systolic heart failure     EF 82-95%  . Hyperlipidemia   . COPD (chronic obstructive pulmonary disease)   . Peripheral vascular disease    . Pneumonia 1980's  . OSA on CPAP   . Anginal pain   . ETOH abuse   . History of stomach ulcers   . Arthritis     "arms"  . Chronic lower back pain   . Anxiety   . Depression   . Adult ADHD     "never diagnosed; my son was; I think I've got it too"  . Chronic kidney disease (CKD), stage III (moderate)     baseline cr 2.3  . Renal cyst     left, complex  . Renal artery stenosis     a. s/p BMS to inferior branch of right renal artery 12/2011. On left, 3 renal arteries were noted, 2 of them were very small in size and subtotally occluded    Current Outpatient Prescriptions on File Prior to Encounter  Medication Sig Dispense Refill  . albuterol (PROVENTIL HFA;VENTOLIN HFA) 108 (90 BASE) MCG/ACT inhaler Inhale 2 puffs into the lungs every 6 (six) hours as needed. For wheezing.      Marland Kitchen albuterol (PROVENTIL) (2.5 MG/3ML) 0.083% nebulizer solution Take 2.5 mg by nebulization every 6 (six) hours as needed. For wheezing.      Marland Kitchen amLODipine (NORVASC) 10 MG tablet Take 10 mg by mouth daily.      . cloNIDine (CATAPRES) 0.1 MG tablet Take 0.2 mg by mouth 2 (two) times daily.       . furosemide (LASIX) 80 MG tablet Take 80 mg by mouth as needed. For weight greater than 168.      Marland Kitchen  gabapentin (NEURONTIN) 100 MG capsule Take 100 mg by mouth 3 (three) times daily.      . hydrALAZINE (APRESOLINE) 100 MG tablet TAKE ONE TABLET BY MOUTH THREE TIMES DAILY  90 tablet  1  . isosorbide mononitrate (IMDUR) 30 MG 24 hr tablet Take 60 mg by mouth daily.       Marland Kitchen PROVENTIL HFA 108 (90 BASE) MCG/ACT inhaler INHALE TWO PUFFS BY MOUTH EVERY 6 HOURS AS NEEDED FOR WHEEZING  7 each  0  . tiotropium (SPIRIVA) 18 MCG inhalation capsule Place 18 mcg into inhaler and inhale daily.       No current facility-administered medications on file prior to encounter.    No Known Allergies   PHYSICAL EXAM: Filed Vitals:   08/29/12 1153  BP: 180/90  Pulse: 91  Weight: 168 lb (76.204 kg)  SpO2: 96%    General:  No  respiratory difficulty HEENT: normal Neck: thick, supple. JVP difficult to assess but appears flat. Carotids 2+ bilat; no bruits. No lymphadenopathy or thryomegaly appreciated. Cor: PMI nondisplaced. Distant regular rate & rhythm. 2/6 MR murmurs. Lungs: Clear   Abdomen: soft, nontender, nondistended. No hepatosplenomegaly. No bruits or masses. Good bowel sounds. Extremities: no cyanosis, rash, edema, bilateral clubbing.   Neuro: alert & oriented x 3, cranial nerves grossly intact. moves all 4 extremities w/o difficulty. Affect pleasant.   ASSESSMENT/PLAN:

## 2012-08-29 NOTE — Patient Instructions (Addendum)
Take clonidine 0.2 mg twice a day (2 tablets in am and pm)  Follow up in 4 months   Do the following things EVERYDAY: 1) Weigh yourself in the morning before breakfast. Write it down and keep it in a log. 2) Take your medicines as prescribed 3) Eat low salt foods-Limit salt (sodium) to 2000 mg per day.  4) Stay as active as you can everyday 5) Limit all fluids for the day to less than 2 liters

## 2012-08-29 NOTE — Assessment & Plan Note (Signed)
Volume status stable. Continue current regimen. Reinforced daily weights, low salt food choices, and medication compliance. Follow up in 4 months.

## 2012-08-29 NOTE — Assessment & Plan Note (Signed)
Continue CPAP.  

## 2012-09-02 ENCOUNTER — Other Ambulatory Visit: Payer: Self-pay | Admitting: Pulmonary Disease

## 2012-09-06 ENCOUNTER — Encounter: Payer: Self-pay | Admitting: Vascular Surgery

## 2012-09-07 ENCOUNTER — Ambulatory Visit: Payer: Medicaid Other | Admitting: Vascular Surgery

## 2012-09-20 ENCOUNTER — Other Ambulatory Visit: Payer: Self-pay | Admitting: Pulmonary Disease

## 2012-09-20 ENCOUNTER — Other Ambulatory Visit (HOSPITAL_COMMUNITY): Payer: Self-pay | Admitting: Adult Health

## 2012-09-22 NOTE — Telephone Encounter (Signed)
Pt last seen Dr. Craige Cotta on 09/15/11; asked to f/u in 6 months. No pending OV.  Proventil HFA rx sent x 1 with instructions to pharm that pt needs OV for further refills.

## 2012-10-04 ENCOUNTER — Encounter: Payer: Self-pay | Admitting: Vascular Surgery

## 2012-10-05 ENCOUNTER — Ambulatory Visit (INDEPENDENT_AMBULATORY_CARE_PROVIDER_SITE_OTHER): Payer: Medicaid Other | Admitting: Vascular Surgery

## 2012-10-05 ENCOUNTER — Encounter: Payer: Self-pay | Admitting: Vascular Surgery

## 2012-10-05 ENCOUNTER — Other Ambulatory Visit: Payer: Self-pay | Admitting: *Deleted

## 2012-10-05 ENCOUNTER — Encounter (INDEPENDENT_AMBULATORY_CARE_PROVIDER_SITE_OTHER): Payer: Medicaid Other | Admitting: *Deleted

## 2012-10-05 ENCOUNTER — Encounter: Payer: Self-pay | Admitting: *Deleted

## 2012-10-05 VITALS — BP 184/99 | HR 90 | Ht 67.0 in | Wt 163.8 lb

## 2012-10-05 DIAGNOSIS — Z01810 Encounter for preprocedural cardiovascular examination: Secondary | ICD-10-CM

## 2012-10-05 DIAGNOSIS — N184 Chronic kidney disease, stage 4 (severe): Secondary | ICD-10-CM

## 2012-10-05 NOTE — Progress Notes (Signed)
[facility] [place] HISTORY AND PHYSICAL   History of Present Illness:  Patient is a 52 y.o. year old male who presents for placement of a permanent hemodialysis access. The patient is right handed .  The patient is not currently on hemodialysis.  The cause of renal failure is thought to be secondary to hypertension.  Other chronic medical problems include CHF, COPD, peripheral arterial disease, sleep apnea wearing CPAP. He becomes short of breath with minimal exertion. He does have some left hand numbness at baseline and says he has carpal tunnel syndrome.  Past Medical History  Diagnosis Date  . Hypertension   . Systolic heart failure     EF 25-30%  . Hyperlipidemia   . COPD (chronic obstructive pulmonary disease)   . Peripheral vascular disease   . Pneumonia 1980's  . OSA on CPAP   . Anginal pain   . ETOH abuse   . History of stomach ulcers   . Arthritis     "arms"  . Chronic lower back pain   . Anxiety   . Depression   . Adult ADHD     "never diagnosed; my son was; I think I've got it too"  . Chronic kidney disease (CKD), stage III (moderate)     baseline cr 2.3  . Renal cyst     left, complex  . Renal artery stenosis     a. s/p BMS to inferior branch of right renal artery 12/2011. On left, 3 renal arteries were noted, 2 of them were very small in size and subtotally occluded    Past Surgical History  Procedure Laterality Date  . Renal artery stent  01/05/2012    right inferior     Social History History  Substance Use Topics  . Smoking status: Current Every Day Smoker -- 0.50 packs/day for 20 years    Types: Cigarettes  . Smokeless tobacco: Current User    Types: Snuff, Chew  . Alcohol Use: Yes     Comment: 01/05/2012 "quit drinking in March 2012"    Family History Family History  Problem Relation Age of Onset  . Brain cancer Mother   . Pancreatic cancer Father   . Heart failure Paternal Uncle   . Heart disease Paternal Uncle   .  Heart failure Paternal Grandfather   . Heart disease Paternal Grandfather     Allergies  No Known Allergies   Current Outpatient Prescriptions  Medication Sig Dispense Refill  . acetaminophen (TYLENOL) 500 MG tablet Take 500 mg by mouth every 6 (six) hours as needed for pain.      . albuterol (PROVENTIL HFA;VENTOLIN HFA) 108 (90 BASE) MCG/ACT inhaler Inhale 2 puffs into the lungs every 6 (six) hours as needed. For wheezing.      . albuterol (PROVENTIL) (2.5 MG/3ML) 0.083% nebulizer solution Take 2.5 mg by nebulization every 6 (six) hours as needed. For wheezing.      . amLODipine (NORVASC) 10 MG tablet Take 10 mg by mouth daily.      . calcitRIOL (ROCALTROL) 0.5 MCG capsule Take 0.5 mcg by mouth daily.      . cloNIDine (CATAPRES) 0.1 MG tablet Take 2 tablets (0.2 mg total) by mouth 2 (two) times daily.  120 tablet  3  . diphenhydrAMINE (BENADRYL) 25 MG tablet Take 25 mg by mouth every 6 (six) hours as needed for itching.      . furosemide (LASIX) 80 MG tablet Take 1 tablet (80 mg total) by mouth   as needed. For weight greater than 168.  30 tablet  3  . gabapentin (NEURONTIN) 100 MG capsule Take 100 mg by mouth 3 (three) times daily.      . hydrALAZINE (APRESOLINE) 100 MG tablet TAKE ONE TABLET BY MOUTH THREE TIMES DAILY...NEEDS FOLLOW UP APPOINTMENT  90 tablet  6  . isosorbide mononitrate (IMDUR) 30 MG 24 hr tablet Take 60 mg by mouth daily.       . Nebivolol HCl (BYSTOLIC) 20 MG TABS Take 20 mg by mouth daily.      . pantoprazole (PROTONIX) 40 MG tablet Take 40 mg by mouth daily.      . PROVENTIL HFA 108 (90 BASE) MCG/ACT inhaler INHALE TWO PUFFS BY MOUTH EVERY 6 HOURS AS NEEDED FOR WHEEZING  1 Inhaler  0  . roflumilast (DALIRESP) 500 MCG TABS tablet Take 500 mcg by mouth daily.      . tiotropium (SPIRIVA) 18 MCG inhalation capsule Place 18 mcg into inhaler and inhale daily.       No current facility-administered medications for this visit.    ROS:   General:  No weight loss, Fever,  chills  HEENT: No recent headaches, no nasal bleeding, no visual changes, no sore throat  Neurologic: No dizziness, blackouts, seizures. No recent symptoms of stroke or mini- stroke. No recent episodes of slurred speech, or temporary blindness.  Cardiac: No recent episodes of chest pain/pressure, no shortness of breath at rest.  + shortness of breath with exertion.  Denies history of atrial fibrillation or irregular heartbeat  Vascular: No history of rest pain in feet.    Pulmonary: No home oxygen, no productive cough, no hemoptysis,  No asthma or wheezing  Musculoskeletal:  [ ] Arthritis, [ ] Low back pain,  [ ] Joint pain  Hematologic:No history of hypercoagulable state.  No history of easy bleeding.  No history of anemia  Gastrointestinal: No hematochezia or melena,  No gastroesophageal reflux, no trouble swallowing  Urinary: [ x] chronic Kidney disease, [ ] on HD - [ ] MWF or [ ] TTHS, [ ] Burning with urination, [ ] Frequent urination, [ ] Difficulty urinating;   Skin: No rashes  Psychological: No history of anxiety,  No history of depression   Physical Examination  Filed Vitals:   10/05/12 1406  BP: 184/99  Pulse: 90  Height: 5' 7" (1.702 m)  Weight: 163 lb 12.8 oz (74.299 kg)  SpO2: 98%    Body mass index is 25.65 kg/(m^2).  General:  Alert and oriented, no acute distress HEENT: Normal Neck: No bruit or JVD Pulmonary: Clear to auscultation bilaterally Cardiac: Regular Rate and Rhythm without murmur Gastrointestinal: Soft, non-tender, non-distended, no mass Skin: No rash Extremity Pulses:  2+ radial, brachial pulses bilaterally Musculoskeletal: No deformity or edema  Neurologic: Upper and lower extremity motor 5/5 and symmetric  DATA: Patient had a vein mapping ultrasound today. I reviewed and interpreted this study. The left forearm cephalic vein is small. Upper arm pain his 3-4 mm in diameter. The upper arm basilic vein is also 3-5 mm in diameter. On the right  side the cephalic vein is too small to be usable. The upper arm right basilic vein is 3-6 mm in diameter.   ASSESSMENT:   Needs long-term hemodialysis access.   PLAN:  Left brachiocephalic AV fistula scheduled for 10/10/2012. Risks benefits possible complications and procedure details were explained the patient today including but not limited to bleeding infection ischemic steal. I also emphasized to the   patient that although he does have baseline numbness in his left hand is potentially could get worse and that he would need to call me if he expands any aching tingling or worsening of his symptoms in his left hand postoperatively.  Charles Fields, MD [facility] [place] Office: [phone] Pager: [phone]  

## 2012-10-06 ENCOUNTER — Encounter: Payer: Self-pay | Admitting: Nephrology

## 2012-10-06 ENCOUNTER — Encounter (HOSPITAL_COMMUNITY): Payer: Self-pay | Admitting: Pharmacy Technician

## 2012-10-09 ENCOUNTER — Encounter (HOSPITAL_COMMUNITY): Payer: Self-pay | Admitting: *Deleted

## 2012-10-09 MED ORDER — DEXTROSE 5 % IV SOLN
1.5000 g | INTRAVENOUS | Status: AC
  Administered 2012-10-10: 1.5 g via INTRAVENOUS
  Filled 2012-10-09: qty 1.5

## 2012-10-09 NOTE — Progress Notes (Addendum)
Pt was in a hurry to get off of phone, "I'm running low on minutes."  Pt said that he was told to be here at 7:30 in am  And that is what time he will be here.  I explained to him the reason why he needed to be early, "my daughter is bring me and she is not a early riser, we have a 1 hour drive, we will be there at 7:30."

## 2012-10-10 ENCOUNTER — Ambulatory Visit (HOSPITAL_COMMUNITY): Payer: Medicaid Other

## 2012-10-10 ENCOUNTER — Encounter (HOSPITAL_COMMUNITY): Payer: Self-pay | Admitting: *Deleted

## 2012-10-10 ENCOUNTER — Telehealth: Payer: Self-pay | Admitting: Vascular Surgery

## 2012-10-10 ENCOUNTER — Encounter (HOSPITAL_COMMUNITY)
Admission: RE | Disposition: A | Discharge status: Home / Self Care | Payer: Self-pay | Source: Ambulatory Visit | Attending: Vascular Surgery

## 2012-10-10 ENCOUNTER — Ambulatory Visit (HOSPITAL_COMMUNITY): Payer: Medicaid Other | Admitting: Anesthesiology

## 2012-10-10 ENCOUNTER — Ambulatory Visit (HOSPITAL_COMMUNITY)
Admission: RE | Admit: 2012-10-10 | Discharge: 2012-10-10 | Disposition: A | Discharge status: Home / Self Care | Payer: Medicaid Other | Source: Ambulatory Visit | Attending: Vascular Surgery | Admitting: Vascular Surgery

## 2012-10-10 ENCOUNTER — Other Ambulatory Visit: Payer: Self-pay | Admitting: *Deleted

## 2012-10-10 ENCOUNTER — Encounter (HOSPITAL_COMMUNITY): Payer: Self-pay | Admitting: Anesthesiology

## 2012-10-10 DIAGNOSIS — I509 Heart failure, unspecified: Secondary | ICD-10-CM | POA: Insufficient documentation

## 2012-10-10 DIAGNOSIS — F329 Major depressive disorder, single episode, unspecified: Secondary | ICD-10-CM | POA: Insufficient documentation

## 2012-10-10 DIAGNOSIS — J4489 Other specified chronic obstructive pulmonary disease: Secondary | ICD-10-CM | POA: Insufficient documentation

## 2012-10-10 DIAGNOSIS — F3289 Other specified depressive episodes: Secondary | ICD-10-CM | POA: Insufficient documentation

## 2012-10-10 DIAGNOSIS — F1010 Alcohol abuse, uncomplicated: Secondary | ICD-10-CM | POA: Insufficient documentation

## 2012-10-10 DIAGNOSIS — Z8711 Personal history of peptic ulcer disease: Secondary | ICD-10-CM | POA: Insufficient documentation

## 2012-10-10 DIAGNOSIS — N186 End stage renal disease: Secondary | ICD-10-CM | POA: Insufficient documentation

## 2012-10-10 DIAGNOSIS — Z8701 Personal history of pneumonia (recurrent): Secondary | ICD-10-CM | POA: Insufficient documentation

## 2012-10-10 DIAGNOSIS — G4733 Obstructive sleep apnea (adult) (pediatric): Secondary | ICD-10-CM | POA: Insufficient documentation

## 2012-10-10 DIAGNOSIS — Z79899 Other long term (current) drug therapy: Secondary | ICD-10-CM | POA: Insufficient documentation

## 2012-10-10 DIAGNOSIS — Z4931 Encounter for adequacy testing for hemodialysis: Secondary | ICD-10-CM

## 2012-10-10 DIAGNOSIS — F17200 Nicotine dependence, unspecified, uncomplicated: Secondary | ICD-10-CM | POA: Insufficient documentation

## 2012-10-10 DIAGNOSIS — I5020 Unspecified systolic (congestive) heart failure: Secondary | ICD-10-CM | POA: Insufficient documentation

## 2012-10-10 DIAGNOSIS — F411 Generalized anxiety disorder: Secondary | ICD-10-CM | POA: Insufficient documentation

## 2012-10-10 DIAGNOSIS — I120 Hypertensive chronic kidney disease with stage 5 chronic kidney disease or end stage renal disease: Secondary | ICD-10-CM | POA: Insufficient documentation

## 2012-10-10 DIAGNOSIS — J449 Chronic obstructive pulmonary disease, unspecified: Secondary | ICD-10-CM | POA: Insufficient documentation

## 2012-10-10 DIAGNOSIS — N184 Chronic kidney disease, stage 4 (severe): Secondary | ICD-10-CM

## 2012-10-10 DIAGNOSIS — I701 Atherosclerosis of renal artery: Secondary | ICD-10-CM | POA: Insufficient documentation

## 2012-10-10 DIAGNOSIS — I739 Peripheral vascular disease, unspecified: Secondary | ICD-10-CM | POA: Insufficient documentation

## 2012-10-10 HISTORY — DX: Heart failure, unspecified: I50.9

## 2012-10-10 HISTORY — DX: Other seasonal allergic rhinitis: J30.2

## 2012-10-10 HISTORY — DX: Inflammatory liver disease, unspecified: K75.9

## 2012-10-10 HISTORY — PX: AV FISTULA PLACEMENT: SHX1204

## 2012-10-10 LAB — POCT I-STAT 4, (NA,K, GLUC, HGB,HCT)
Glucose, Bld: 118 mg/dL — ABNORMAL HIGH (ref 70–99)
HCT: 34 % — ABNORMAL LOW (ref 39.0–52.0)
Sodium: 133 mEq/L — ABNORMAL LOW (ref 135–145)

## 2012-10-10 LAB — SURGICAL PCR SCREEN: Staphylococcus aureus: NEGATIVE

## 2012-10-10 SURGERY — ARTERIOVENOUS (AV) FISTULA CREATION
Anesthesia: Monitor Anesthesia Care | Site: Arm Upper | Laterality: Left | Wound class: Clean

## 2012-10-10 MED ORDER — FENTANYL CITRATE 0.05 MG/ML IJ SOLN
INTRAMUSCULAR | Status: DC | PRN
  Administered 2012-10-10: 25 ug via INTRAVENOUS
  Administered 2012-10-10 (×2): 50 ug via INTRAVENOUS
  Administered 2012-10-10: 25 ug via INTRAVENOUS
  Administered 2012-10-10: 50 ug via INTRAVENOUS

## 2012-10-10 MED ORDER — 0.9 % SODIUM CHLORIDE (POUR BTL) OPTIME
TOPICAL | Status: DC | PRN
  Administered 2012-10-10: 1000 mL

## 2012-10-10 MED ORDER — MUPIROCIN 2 % EX OINT
TOPICAL_OINTMENT | CUTANEOUS | Status: AC
  Filled 2012-10-10: qty 22

## 2012-10-10 MED ORDER — SODIUM CHLORIDE 0.9 % IV SOLN: INTRAVENOUS | Status: DC

## 2012-10-10 MED ORDER — MIDAZOLAM HCL 5 MG/5ML IJ SOLN
INTRAMUSCULAR | Status: DC | PRN
  Administered 2012-10-10 (×2): 1 mg via INTRAVENOUS

## 2012-10-10 MED ORDER — MUPIROCIN 2 % EX OINT
TOPICAL_OINTMENT | Freq: Two times a day (BID) | CUTANEOUS | Status: DC
  Administered 2012-10-10: 1 via NASAL

## 2012-10-10 MED ORDER — LIDOCAINE HCL (PF) 1 % IJ SOLN
INTRAMUSCULAR | Status: DC | PRN
  Administered 2012-10-10: 30 mL via SUBCUTANEOUS

## 2012-10-10 MED ORDER — FENTANYL CITRATE 0.05 MG/ML IJ SOLN: 25.0000 ug | INTRAMUSCULAR | Status: DC | PRN

## 2012-10-10 MED ORDER — PROMETHAZINE HCL 25 MG/ML IJ SOLN: 6.2500 mg | INTRAMUSCULAR | Status: DC | PRN

## 2012-10-10 MED ORDER — MEPERIDINE HCL 25 MG/ML IJ SOLN: 6.2500 mg | INTRAMUSCULAR | Status: DC | PRN

## 2012-10-10 MED ORDER — LACTATED RINGERS IV SOLN: INTRAVENOUS | Status: DC

## 2012-10-10 MED ORDER — SODIUM CHLORIDE 0.9 % IV SOLN
INTRAVENOUS | Status: DC
  Administered 2012-10-10: 20 mL/h via INTRAVENOUS

## 2012-10-10 MED ORDER — OXYCODONE HCL 5 MG PO TABS: 5.0000 mg | ORAL_TABLET | Freq: Four times a day (QID) | ORAL | Status: DC | PRN

## 2012-10-10 MED ORDER — HEPARIN SODIUM (PORCINE) 1000 UNIT/ML IJ SOLN
INTRAMUSCULAR | Status: DC | PRN
  Administered 2012-10-10: 5000 [IU] via INTRAVENOUS

## 2012-10-10 MED ORDER — SODIUM CHLORIDE 0.9 % IV SOLN
INTRAVENOUS | Status: DC | PRN
  Administered 2012-10-10: 10:00:00 via INTRAVENOUS

## 2012-10-10 MED ORDER — SODIUM CHLORIDE 0.9 % IR SOLN
Status: DC | PRN
  Administered 2012-10-10: 10:00:00

## 2012-10-10 MED ORDER — LIDOCAINE HCL (PF) 1 % IJ SOLN
INTRAMUSCULAR | Status: AC
  Filled 2012-10-10: qty 30

## 2012-10-10 MED ORDER — PROPOFOL INFUSION 10 MG/ML OPTIME
INTRAVENOUS | Status: DC | PRN
  Administered 2012-10-10: 25 ug/kg/min via INTRAVENOUS

## 2012-10-10 MED ORDER — ALBUTEROL SULFATE (5 MG/ML) 0.5% IN NEBU
INHALATION_SOLUTION | RESPIRATORY_TRACT | Status: AC
  Administered 2012-10-10: 2.5 mg via RESPIRATORY_TRACT
  Filled 2012-10-10: qty 0.5

## 2012-10-10 MED ORDER — ALBUTEROL SULFATE (5 MG/ML) 0.5% IN NEBU: 2.5000 mg | INHALATION_SOLUTION | RESPIRATORY_TRACT | Status: DC | PRN

## 2012-10-10 SURGICAL SUPPLY — 39 items
ARMBAND PINK RESTRICT EXTREMIT (MISCELLANEOUS) ×2 IMPLANT
CANISTER SUCTION 2500CC (MISCELLANEOUS) ×2 IMPLANT
CLIP TI MEDIUM 6 (CLIP) ×2 IMPLANT
CLIP TI WIDE RED SMALL 6 (CLIP) ×2 IMPLANT
CLOTH BEACON ORANGE TIMEOUT ST (SAFETY) ×2 IMPLANT
COVER PROBE W GEL 5X96 (DRAPES) IMPLANT
COVER SURGICAL LIGHT HANDLE (MISCELLANEOUS) ×2 IMPLANT
DECANTER SPIKE VIAL GLASS SM (MISCELLANEOUS) ×2 IMPLANT
DERMABOND ADVANCED (GAUZE/BANDAGES/DRESSINGS) ×1
DERMABOND ADVANCED .7 DNX12 (GAUZE/BANDAGES/DRESSINGS) ×1 IMPLANT
DRAIN PENROSE 1/4X12 LTX STRL (WOUND CARE) ×2 IMPLANT
ELECT REM PT RETURN 9FT ADLT (ELECTROSURGICAL) ×2
ELECTRODE REM PT RTRN 9FT ADLT (ELECTROSURGICAL) ×1 IMPLANT
GEL ULTRASOUND 20GR AQUASONIC (MISCELLANEOUS) IMPLANT
GLOVE BIO SURGEON STRL SZ 6.5 (GLOVE) ×4 IMPLANT
GLOVE BIO SURGEON STRL SZ7.5 (GLOVE) ×4 IMPLANT
GLOVE BIOGEL PI IND STRL 6.5 (GLOVE) ×1 IMPLANT
GLOVE BIOGEL PI IND STRL 7.0 (GLOVE) ×1 IMPLANT
GLOVE BIOGEL PI IND STRL 7.5 (GLOVE) ×1 IMPLANT
GLOVE BIOGEL PI INDICATOR 6.5 (GLOVE) ×1
GLOVE BIOGEL PI INDICATOR 7.0 (GLOVE) ×1
GLOVE BIOGEL PI INDICATOR 7.5 (GLOVE) ×1
GOWN PREVENTION PLUS XLARGE (GOWN DISPOSABLE) ×2 IMPLANT
GOWN STRL NON-REIN LRG LVL3 (GOWN DISPOSABLE) ×4 IMPLANT
KIT BASIN OR (CUSTOM PROCEDURE TRAY) ×2 IMPLANT
KIT ROOM TURNOVER OR (KITS) ×2 IMPLANT
LOOP VESSEL MINI RED (MISCELLANEOUS) IMPLANT
NS IRRIG 1000ML POUR BTL (IV SOLUTION) ×2 IMPLANT
PACK CV ACCESS (CUSTOM PROCEDURE TRAY) ×2 IMPLANT
PAD ARMBOARD 7.5X6 YLW CONV (MISCELLANEOUS) ×4 IMPLANT
SPONGE SURGIFOAM ABS GEL 100 (HEMOSTASIS) IMPLANT
SUT PROLENE 7 0 BV 1 (SUTURE) ×2 IMPLANT
SUT VIC AB 3-0 SH 27 (SUTURE) ×1
SUT VIC AB 3-0 SH 27X BRD (SUTURE) ×1 IMPLANT
SUT VICRYL 4-0 PS2 18IN ABS (SUTURE) ×2 IMPLANT
TOWEL OR 17X24 6PK STRL BLUE (TOWEL DISPOSABLE) ×2 IMPLANT
TOWEL OR 17X26 10 PK STRL BLUE (TOWEL DISPOSABLE) ×2 IMPLANT
UNDERPAD 30X30 INCONTINENT (UNDERPADS AND DIAPERS) ×2 IMPLANT
WATER STERILE IRR 1000ML POUR (IV SOLUTION) ×2 IMPLANT

## 2012-10-10 NOTE — Interval H&P Note (Signed)
History and Physical Interval Note:  10/10/2012 9:04 AM  [name]  has presented today for surgery, with the diagnosis of ESRD  The various methods of treatment have been discussed with the patient and family. After consideration of risks, benefits and other options for treatment, the patient has consented to  Procedure(s) with comments: ARTERIOVENOUS (AV) FISTULA CREATION (Left) - B-C as a surgical intervention .  The patient's history has been reviewed, patient examined, no change in status, stable for surgery.  I have reviewed the patient's chart and labs.  Questions were answered to the patient's satisfaction.     FIELDS,CHARLES E

## 2012-10-10 NOTE — Telephone Encounter (Addendum)
Message copied by Fredrich Birks on Tue Oct 10, 2012 12:16 PM ------      Message from: Sharee Pimple      Created: Tue Oct 10, 2012 11:12 AM      Regarding: schedule                   ----- Message -----         From: Dara Lords, PA-C         Sent: 10/10/2012  11:01 AM           To: Sharee Pimple, CMA            S/p left brachiocephalic AVF 10/10/12.  F/u with Dr. Darrick Penna in 4 weeks.            Thanks,      Lelon Mast ------  Tried to call patient, phone rings and is picked up, but no one says anything- mailed letter, dpm

## 2012-10-10 NOTE — Anesthesia Postprocedure Evaluation (Signed)
Anesthesia Post Note  Patient: [name]  Procedure(s) Performed: Procedure(s) (LRB): ARTERIOVENOUS (AV) FISTULA CREATION (Left)  Anesthesia type: general  Patient location: PACU  Post pain: Pain level controlled  Post assessment: Patient's Cardiovascular Status Stable  Last Vitals:  Filed Vitals:   10/10/12 1151  BP: 138/74  Pulse: 79  Temp: 36.3 C  Resp: 18    Post vital signs: Reviewed and stable  Level of consciousness: sedated  Complications: No apparent anesthesia complications

## 2012-10-10 NOTE — Preoperative (Signed)
Beta Blockers   Reason not to administer Beta Blockers:Not Applicable 

## 2012-10-10 NOTE — Transfer of Care (Signed)
Immediate Anesthesia Transfer of Care Note  Patient: [name]  Procedure(s) Performed: Procedure(s) with comments: ARTERIOVENOUS (AV) FISTULA CREATION (Left) - Brachio-cephalic  Patient Location: PACU  Anesthesia Type:MAC  Level of Consciousness: awake, alert , oriented and sedated  Airway & Oxygen Therapy: Patient Spontanous Breathing and Patient connected to nasal cannula oxygen  Post-op Assessment: Report given to PACU RN, Post -op Vital signs reviewed and stable and Patient moving all extremities  Post vital signs: Reviewed and stable  Complications: No apparent anesthesia complications

## 2012-10-10 NOTE — H&P (View-Only) (Signed)
[facility] [place] HISTORY AND PHYSICAL   History of Present Illness:  Patient is a 52 y.o. year old male who presents for placement of a permanent hemodialysis access. The patient is right handed .  The patient is not currently on hemodialysis.  The cause of renal failure is thought to be secondary to hypertension.  Other chronic medical problems include CHF, COPD, peripheral arterial disease, sleep apnea wearing CPAP. He becomes short of breath with minimal exertion. He does have some left hand numbness at baseline and says he has carpal tunnel syndrome.  Past Medical History  Diagnosis Date  . Hypertension   . Systolic heart failure     EF 40-98%  . Hyperlipidemia   . COPD (chronic obstructive pulmonary disease)   . Peripheral vascular disease   . Pneumonia 1980's  . OSA on CPAP   . Anginal pain   . ETOH abuse   . History of stomach ulcers   . Arthritis     "arms"  . Chronic lower back pain   . Anxiety   . Depression   . Adult ADHD     "never diagnosed; my son was; I think I've got it too"  . Chronic kidney disease (CKD), stage III (moderate)     baseline cr 2.3  . Renal cyst     left, complex  . Renal artery stenosis     a. s/p BMS to inferior branch of right renal artery 12/2011. On left, 3 renal arteries were noted, 2 of them were very small in size and subtotally occluded    Past Surgical History  Procedure Laterality Date  . Renal artery stent  01/05/2012    right inferior     Social History History  Substance Use Topics  . Smoking status: Current Every Day Smoker -- 0.50 packs/day for 20 years    Types: Cigarettes  . Smokeless tobacco: Current User    Types: Snuff, Chew  . Alcohol Use: Yes     Comment: 01/05/2012 "quit drinking in March 2012"    Family History Family History  Problem Relation Age of Onset  . Brain cancer Mother   . Pancreatic cancer Father   . Heart failure Paternal Uncle   . Heart disease Paternal Uncle   .  Heart failure Paternal Grandfather   . Heart disease Paternal Grandfather     Allergies  No Known Allergies   Current Outpatient Prescriptions  Medication Sig Dispense Refill  . acetaminophen (TYLENOL) 500 MG tablet Take 500 mg by mouth every 6 (six) hours as needed for pain.      Marland Kitchen albuterol (PROVENTIL HFA;VENTOLIN HFA) 108 (90 BASE) MCG/ACT inhaler Inhale 2 puffs into the lungs every 6 (six) hours as needed. For wheezing.      Marland Kitchen albuterol (PROVENTIL) (2.5 MG/3ML) 0.083% nebulizer solution Take 2.5 mg by nebulization every 6 (six) hours as needed. For wheezing.      Marland Kitchen amLODipine (NORVASC) 10 MG tablet Take 10 mg by mouth daily.      . calcitRIOL (ROCALTROL) 0.5 MCG capsule Take 0.5 mcg by mouth daily.      . cloNIDine (CATAPRES) 0.1 MG tablet Take 2 tablets (0.2 mg total) by mouth 2 (two) times daily.  120 tablet  3  . diphenhydrAMINE (BENADRYL) 25 MG tablet Take 25 mg by mouth every 6 (six) hours as needed for itching.      . furosemide (LASIX) 80 MG tablet Take 1 tablet (80 mg total) by mouth  as needed. For weight greater than 168.  30 tablet  3  . gabapentin (NEURONTIN) 100 MG capsule Take 100 mg by mouth 3 (three) times daily.      . hydrALAZINE (APRESOLINE) 100 MG tablet TAKE ONE TABLET BY MOUTH THREE TIMES DAILY...NEEDS FOLLOW UP APPOINTMENT  90 tablet  6  . isosorbide mononitrate (IMDUR) 30 MG 24 hr tablet Take 60 mg by mouth daily.       . Nebivolol HCl (BYSTOLIC) 20 MG TABS Take 20 mg by mouth daily.      . pantoprazole (PROTONIX) 40 MG tablet Take 40 mg by mouth daily.      Marland Kitchen PROVENTIL HFA 108 (90 BASE) MCG/ACT inhaler INHALE TWO PUFFS BY MOUTH EVERY 6 HOURS AS NEEDED FOR WHEEZING  1 Inhaler  0  . roflumilast (DALIRESP) 500 MCG TABS tablet Take 500 mcg by mouth daily.      Marland Kitchen tiotropium (SPIRIVA) 18 MCG inhalation capsule Place 18 mcg into inhaler and inhale daily.       No current facility-administered medications for this visit.    ROS:   General:  No weight loss, Fever,  chills  HEENT: No recent headaches, no nasal bleeding, no visual changes, no sore throat  Neurologic: No dizziness, blackouts, seizures. No recent symptoms of stroke or mini- stroke. No recent episodes of slurred speech, or temporary blindness.  Cardiac: No recent episodes of chest pain/pressure, no shortness of breath at rest.  + shortness of breath with exertion.  Denies history of atrial fibrillation or irregular heartbeat  Vascular: No history of rest pain in feet.    Pulmonary: No home oxygen, no productive cough, no hemoptysis,  No asthma or wheezing  Musculoskeletal:  [ ]  Arthritis, [ ]  Low back pain,  [ ]  Joint pain  Hematologic:No history of hypercoagulable state.  No history of easy bleeding.  No history of anemia  Gastrointestinal: No hematochezia or melena,  No gastroesophageal reflux, no trouble swallowing  Urinary: [ x] chronic Kidney disease, [ ]  on HD - [ ]  MWF or [ ]  TTHS, [ ]  Burning with urination, [ ]  Frequent urination, [ ]  Difficulty urinating;   Skin: No rashes  Psychological: No history of anxiety,  No history of depression   Physical Examination  Filed Vitals:   10/05/12 1406  BP: 184/99  Pulse: 90  Height: 5\' 7"  (1.702 m)  Weight: 163 lb 12.8 oz (74.299 kg)  SpO2: 98%    Body mass index is 25.65 kg/(m^2).  General:  Alert and oriented, no acute distress HEENT: Normal Neck: No bruit or JVD Pulmonary: Clear to auscultation bilaterally Cardiac: Regular Rate and Rhythm without murmur Gastrointestinal: Soft, non-tender, non-distended, no mass Skin: No rash Extremity Pulses:  2+ radial, brachial pulses bilaterally Musculoskeletal: No deformity or edema  Neurologic: Upper and lower extremity motor 5/5 and symmetric  DATA: Patient had a vein mapping ultrasound today. I reviewed and interpreted this study. The left forearm cephalic vein is small. Upper arm pain his 3-4 mm in diameter. The upper arm basilic vein is also 3-5 mm in diameter. On the right  side the cephalic vein is too small to be usable. The upper arm right basilic vein is 3-6 mm in diameter.   ASSESSMENT:   Needs long-term hemodialysis access.   PLAN:  Left brachiocephalic AV fistula scheduled for 10/10/2012. Risks benefits possible complications and procedure details were explained the patient today including but not limited to bleeding infection ischemic steal. I also emphasized to the  patient that although he does have baseline numbness in his left hand is potentially could get worse and that he would need to call me if he expands any aching tingling or worsening of his symptoms in his left hand postoperatively.  Fabienne Bruns, MD [facility] [place] Office: [phone] Pager: [phone]

## 2012-10-10 NOTE — Op Note (Signed)
Procedure: Left Brachial Cephalic AV fistula  Preop: ESRD  Postop: ESRD  Anesthesia: Local with sedation  Assistant:  Doreatha Massed PA-C  Findings: 3.5 mm cephalic vein  Procedure: After obtaining informed consent, the patient was taken to the operating room.  After adequate sedation, the left upper extremity was prepped and draped in usual sterile fashion.  Local anesthesia was infiltrated in the antecubital crease.  A transverse incision was then made near the antecubital crease the left arm. The incision was carried into the subcutaneous tissues down to level of the cephalic vein. The cephalic vein was approximately 3.5 mm in diameter. It was of good quality. This was dissected free circumferentially and small side branches ligated and divided between silk ties or clips. Next the brachial artery was dissected free in the medial portion of the incision. The artery was  3-4 mm in diameter. The vessel loops were placed proximal and distal to the planned site of arteriotomy. The patient was given 5000 units of intravenous heparin. After appropriate circulation time, the vessel loops were used to control the artery. A longitudinal opening was made in the brachial artery.  The vein was ligated distally with a 2-0 silk tie. The vein was controlled proximally with a fine bulldog clamp. The vein was then swung over to the artery and sewn end of vein to side of artery using a running 7-0 Prolene suture. Just prior to completion of the anastomosis, everything was fore bled back bled and thoroughly flushed. The anastomosis was secured, vessel loops released, and there was a palpable thrill in the fistula immediately. After hemostasis was obtained, the subcutaneous tissues were reapproximated using a running 3-0 Vicryl suture. The skin was then closed with a 4 Vicryl subcuticular stitch. Dermabond was applied to the skin incision.  The patient had a palpable radial pulse at the end of the case.  Fabienne Bruns, MD [facility] [place] Office: [phone] Pager: [phone]

## 2012-10-10 NOTE — Anesthesia Preprocedure Evaluation (Addendum)
Anesthesia Evaluation  Patient identified by MRN, date of birth, ID band Patient awake    Reviewed: Allergy & Precautions, H&P , NPO status , Patient's Chart, lab work & pertinent test results  Airway Mallampati: III TM Distance: >3 FB Neck ROM: Full    Dental no notable dental hx.    Pulmonary sleep apnea and Continuous Positive Airway Pressure Ventilation , COPDCurrent Smoker,    Pulmonary exam normal + wheezing      Cardiovascular hypertension, Pt. on medications + Peripheral Vascular Disease and +CHF (EF 25-30%) Rhythm:Regular Rate:Normal     Neuro/Psych negative neurological ROS  negative psych ROS   GI/Hepatic negative GI ROS, (+) Hepatitis -, C  Endo/Other  negative endocrine ROS  Renal/GU CRFRenal disease  negative genitourinary   Musculoskeletal negative musculoskeletal ROS (+)   Abdominal   Peds negative pediatric ROS (+)  Hematology negative hematology ROS (+)   Anesthesia Other Findings   Reproductive/Obstetrics negative OB ROS                          Anesthesia Physical Anesthesia Plan  ASA: III  Anesthesia Plan: MAC   Post-op Pain Management:    Induction:   Airway Management Planned: Simple Face Mask  Additional Equipment:   Intra-op Plan:   Post-operative Plan:   Informed Consent: I have reviewed the patients History and Physical, chart, labs and discussed the procedure including the risks, benefits and alternatives for the proposed anesthesia with the patient or authorized representative who has indicated his/her understanding and acceptance.   Dental advisory given  Plan Discussed with: CRNA  Anesthesia Plan Comments: (Albuterol neb pre-op)        Anesthesia Quick Evaluation

## 2012-10-11 ENCOUNTER — Telehealth: Payer: Self-pay
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
## 2012-10-12 ENCOUNTER — Encounter (HOSPITAL_COMMUNITY): Payer: Self-pay | Admitting: Vascular Surgery

## 2012-10-12 ENCOUNTER — Ambulatory Visit: Payer: Medicaid Other | Admitting: Vascular Surgery

## 2012-10-25 ENCOUNTER — Other Ambulatory Visit: Payer: Self-pay | Admitting: Pulmonary Disease

## 2012-10-31 ENCOUNTER — Telehealth: Payer: Self-pay | Admitting: Pulmonary Disease

## 2012-10-31 NOTE — Telephone Encounter (Signed)
Received a fax from [facility] for his CPAP supplies.  Per VS - patient will need an appointment before he can sign off on the orders. Pt has not been seen since 09/2011.  Pt is aware. He is unable to make an appointment at this time due to transportation issues. He will call back when he can make an appointment. Pt is aware that we will not be able to sign the forms for his supplies until he is seen.

## 2012-11-15 ENCOUNTER — Encounter: Payer: Self-pay | Admitting: Vascular Surgery

## 2012-11-16 ENCOUNTER — Ambulatory Visit: Payer: Medicaid Other | Admitting: Vascular Surgery

## 2012-11-22 ENCOUNTER — Encounter: Payer: Self-pay | Admitting: Vascular Surgery

## 2012-11-23 ENCOUNTER — Ambulatory Visit: Payer: Medicaid Other | Admitting: Vascular Surgery

## 2012-12-27 ENCOUNTER — Encounter: Payer: Self-pay | Admitting: Vascular Surgery

## 2012-12-28 ENCOUNTER — Ambulatory Visit: Payer: Medicaid Other | Admitting: Vascular Surgery

## 2012-12-29 ENCOUNTER — Other Ambulatory Visit (HOSPITAL_COMMUNITY): Payer: Self-pay | Admitting: Physician Assistant

## 2013-01-29 ENCOUNTER — Telehealth: Payer: Self-pay | Admitting: Pulmonary Disease

## 2013-01-29 NOTE — Telephone Encounter (Signed)
We received paperwork for renewal on pt's CPAP supplies. Per VS - pt will need ROV before we can fill this out.  Attempted to call his cell phone but no one answered and his voicemail had not been set up. Reach the pt's sister and she will contact him to have him call us back.

## 2013-01-30 NOTE — Telephone Encounter (Signed)
Pt is aware that he needs ROV. Appointment has been made for 02/15/13 @ 1:30p.

## 2013-02-02 ENCOUNTER — Other Ambulatory Visit (HOSPITAL_COMMUNITY): Payer: Self-pay | Admitting: Physician Assistant

## 2013-02-15 ENCOUNTER — Ambulatory Visit (INDEPENDENT_AMBULATORY_CARE_PROVIDER_SITE_OTHER): Payer: Medicaid Other | Admitting: Pulmonary Disease

## 2013-02-15 ENCOUNTER — Encounter: Payer: Self-pay | Admitting: Pulmonary Disease

## 2013-02-15 VITALS — BP 144/84 | HR 88 | Ht 67.0 in | Wt 162.0 lb

## 2013-02-15 DIAGNOSIS — Z23 Encounter for immunization: Secondary | ICD-10-CM

## 2013-02-15 DIAGNOSIS — J449 Chronic obstructive pulmonary disease, unspecified: Secondary | ICD-10-CM

## 2013-02-15 DIAGNOSIS — G4733 Obstructive sleep apnea (adult) (pediatric): Secondary | ICD-10-CM

## 2013-02-15 MED ORDER — UMECLIDINIUM-VILANTEROL 62.5-25 MCG/INH IN AEPB: 1.0000 | INHALATION_SPRAY | Freq: Every day | RESPIRATORY_TRACT | Status: DC

## 2013-02-15 MED ORDER — ALBUTEROL SULFATE (2.5 MG/3ML) 0.083% IN NEBU: 2.5000 mg | INHALATION_SOLUTION | Freq: Four times a day (QID) | RESPIRATORY_TRACT | Status: DC | PRN

## 2013-02-15 NOTE — Patient Instructions (Signed)
Flu shot today Anoro one puff daily >> call in two weeks to update status, and will call in refill Do not use spiriva when using anoro Will get CPAP report and call with results Follow up in 6 months

## 2013-02-15 NOTE — Progress Notes (Signed)
Chief Complaint  Patient presents with  . COPD    Breathing is unchanged. Reports SOB, coughing and chest tightness. Denies wheezing.    History of Present Illness: [name] is a 52 y.o. male former smoker with COPD, and OSA.  I last saw [name] in 2013.    He gets occasional cough and sputum.  He is not having much wheeze.    He has been using CPAP.  TESTS: PFT 04/09/11 >> FEV1 2.10 (57%), FEV1% 69, TLC 7.37 (112%), DLCO 61%, no BD response  CT chest w/o contrast 04/12/11 >> basilar scarring PSG 07/07/11 >> AHI 38.2, SpO2 low 87%, CPAP 7 cm H2O Echo 12/08/11 >> EF 55 to 60%, mild MR  He  has a past medical history of Hypertension; Systolic heart failure; Hyperlipidemia; COPD (chronic obstructive pulmonary disease); Peripheral vascular disease; Pneumonia (1980's); OSA on CPAP; ETOH abuse; History of stomach ulcers; Arthritis; Chronic lower back pain; Anxiety; Depression; Adult ADHD; Chronic kidney disease (CKD), stage III (moderate); Renal cyst; Renal artery stenosis; Anginal pain; CHF (congestive heart failure); Seasonal allergies; and Hepatitis.  He  has past surgical history that includes Renal artery stent (01/05/2012); No past surgeries; and AV fistula placement (Left, 10/10/2012).   Current Outpatient Prescriptions on File Prior to Visit  Medication Sig Dispense Refill  . acetaminophen (TYLENOL) 500 MG tablet Take 500 mg by mouth every 6 (six) hours as needed for pain. For pain      . albuterol (PROVENTIL HFA;VENTOLIN HFA) 108 (90 BASE) MCG/ACT inhaler Inhale 2 puffs into the lungs every 6 (six) hours as needed. For wheezing.      Marland Kitchen amLODipine (NORVASC) 10 MG tablet Take 10 mg by mouth daily.      . calcitRIOL (ROCALTROL) 0.5 MCG capsule Take 0.5 mcg by mouth daily.      . cloNIDine (CATAPRES) 0.1 MG tablet Take 2 tablets (0.2 mg total) by mouth 2 (two) times daily.  120 tablet  3  . diphenhydrAMINE (BENADRYL) 25 MG tablet Take 25 mg by mouth every 6 (six) hours as needed  for itching. For itching      . furosemide (LASIX) 80 MG tablet Take 1 tablet (80 mg total) by mouth as needed. For weight greater than 168.  30 tablet  3  . gabapentin (NEURONTIN) 100 MG capsule Take 100 mg by mouth 3 (three) times daily.      . hydrALAZINE (APRESOLINE) 100 MG tablet Take 100 mg by mouth 3 (three) times daily.      . isosorbide mononitrate (IMDUR) 30 MG 24 hr tablet Take 60 mg by mouth daily.       . Nebivolol HCl (BYSTOLIC) 20 MG TABS Take 20 mg by mouth daily.      Marland Kitchen oxyCODONE (ROXICODONE) 5 MG immediate release tablet Take 1 tablet (5 mg total) by mouth every 6 (six) hours as needed for pain.  30 tablet  0  . pantoprazole (PROTONIX) 40 MG tablet Take 40 mg by mouth daily.      . roflumilast (DALIRESP) 500 MCG TABS tablet Take 500 mcg by mouth daily.      Marland Kitchen tiotropium (SPIRIVA) 18 MCG inhalation capsule Place 18 mcg into inhaler and inhale daily.       No current facility-administered medications on file prior to visit.     No Known Allergies  Physical Exam:  General: obese, no distress  HEENT: no sinus tenderness, no LAN  Cardiac: s1s2 regular, no murmur  Chest: prolonged exhalation, no  wheeze/rales  Abd: obese, soft, nontender  Ext: no edema  Neuro: normal strength  Psych: normal mood, behavior   Assessment/Plan:  Coralyn Helling, MD Pager:  [phone] 02/15/2013, 2:02 PM

## 2013-02-16 NOTE — Assessment & Plan Note (Signed)
Will get copy of his CPAP download.  Depending on results will determine if he needs adjustment to his set up.

## 2013-02-16 NOTE — Assessment & Plan Note (Signed)
He is intolerant of ICS due to thrush.  He has improvement with spiriva, but has persistence of symptoms.  Will give him trial of anoro.  He is to call back once his sample is complete to inform whether he would like refill for anoro, or if he would prefer to switch back to spiriva.    He is to continue prn albuterol.  Will have him continue daliresp for now >> re-assess whether he should continue this at next visit.

## 2013-03-22 ENCOUNTER — Ambulatory Visit: Payer: Medicaid Other | Admitting: Pulmonary Disease

## 2013-04-02 ENCOUNTER — Other Ambulatory Visit (HOSPITAL_COMMUNITY): Payer: Self-pay | Admitting: Adult Health

## 2013-05-23 ENCOUNTER — Other Ambulatory Visit: Payer: Self-pay | Admitting: *Deleted

## 2013-05-23 MED ORDER — ALBUTEROL SULFATE HFA 108 (90 BASE) MCG/ACT IN AERS: 2.0000 | INHALATION_SPRAY | Freq: Four times a day (QID) | RESPIRATORY_TRACT | Status: DC | PRN

## 2013-06-06 IMAGING — CR DG ABDOMEN 2V
3 series · 3 of 3 positions shown · non-contrast
Comparison: 03/25/2011

CLINICAL DATA: Abdominal fullness

ABDOMEN - 2 VIEW

[w abdomen upright]
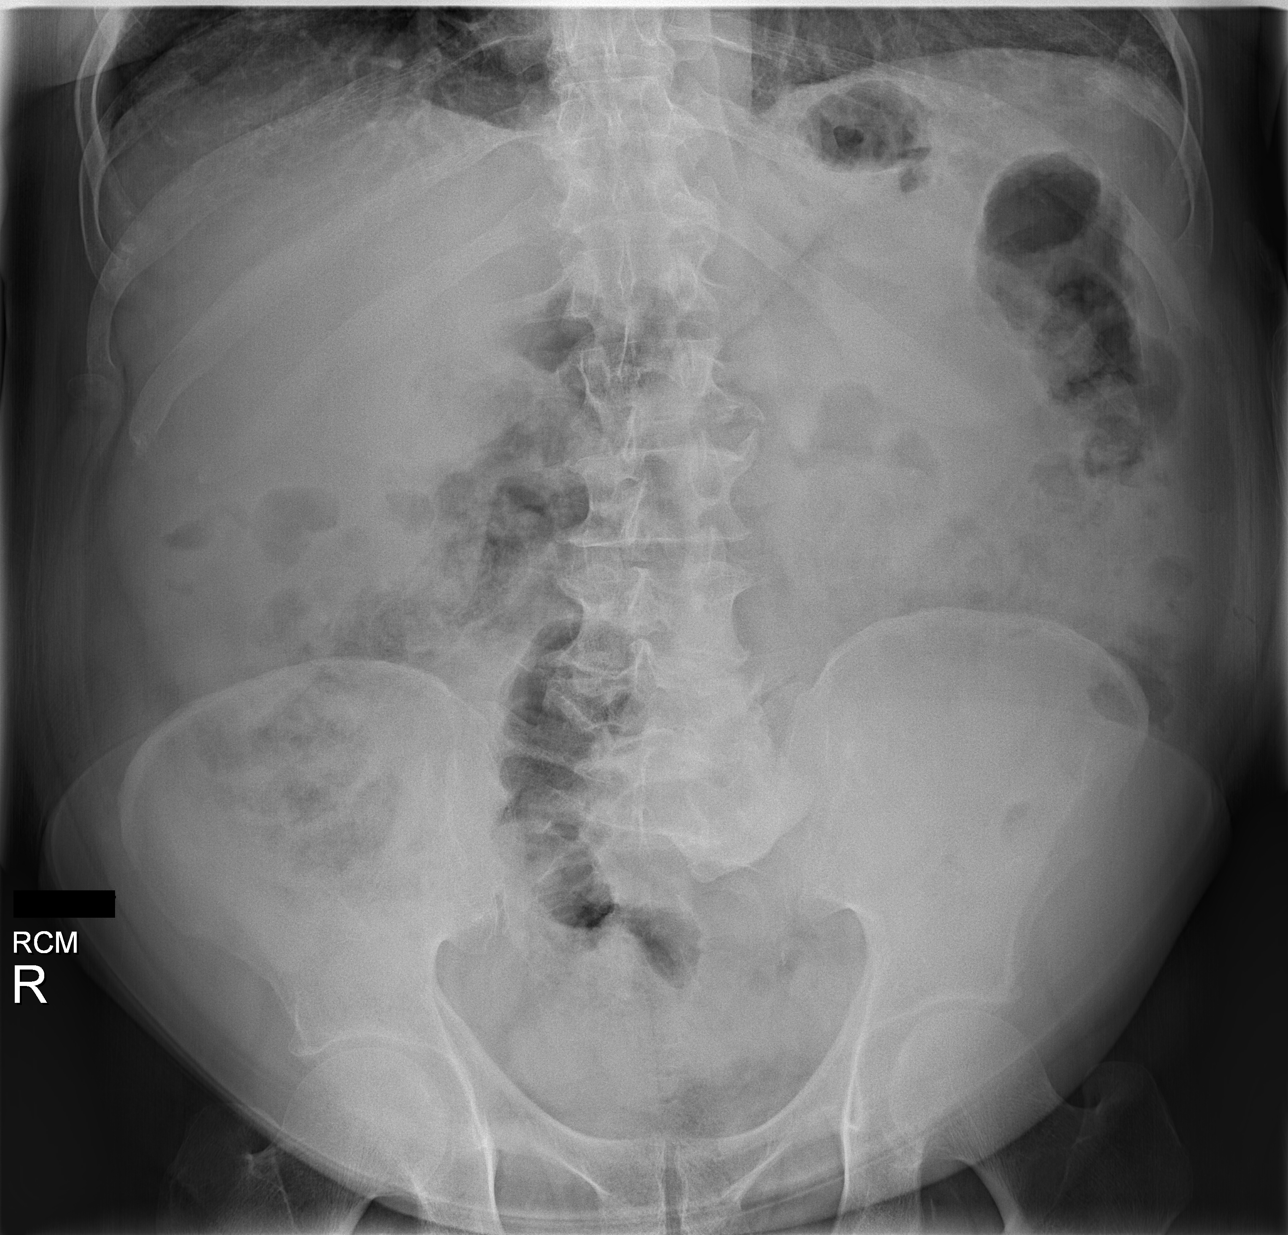

[t abdomen supine (1 of 2)]
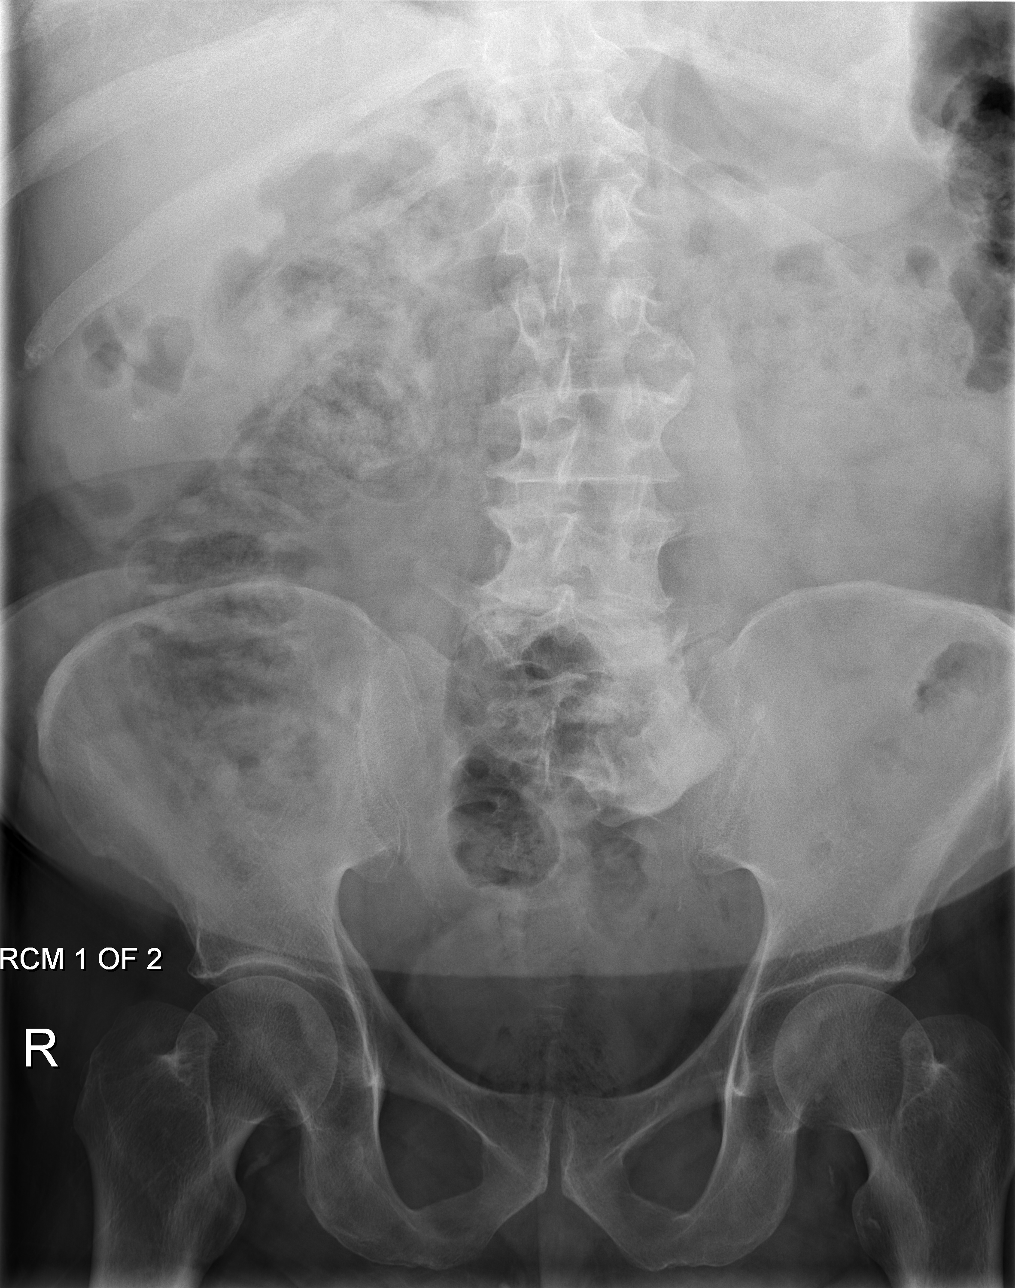

[t abdomen supine (2 of 2)]
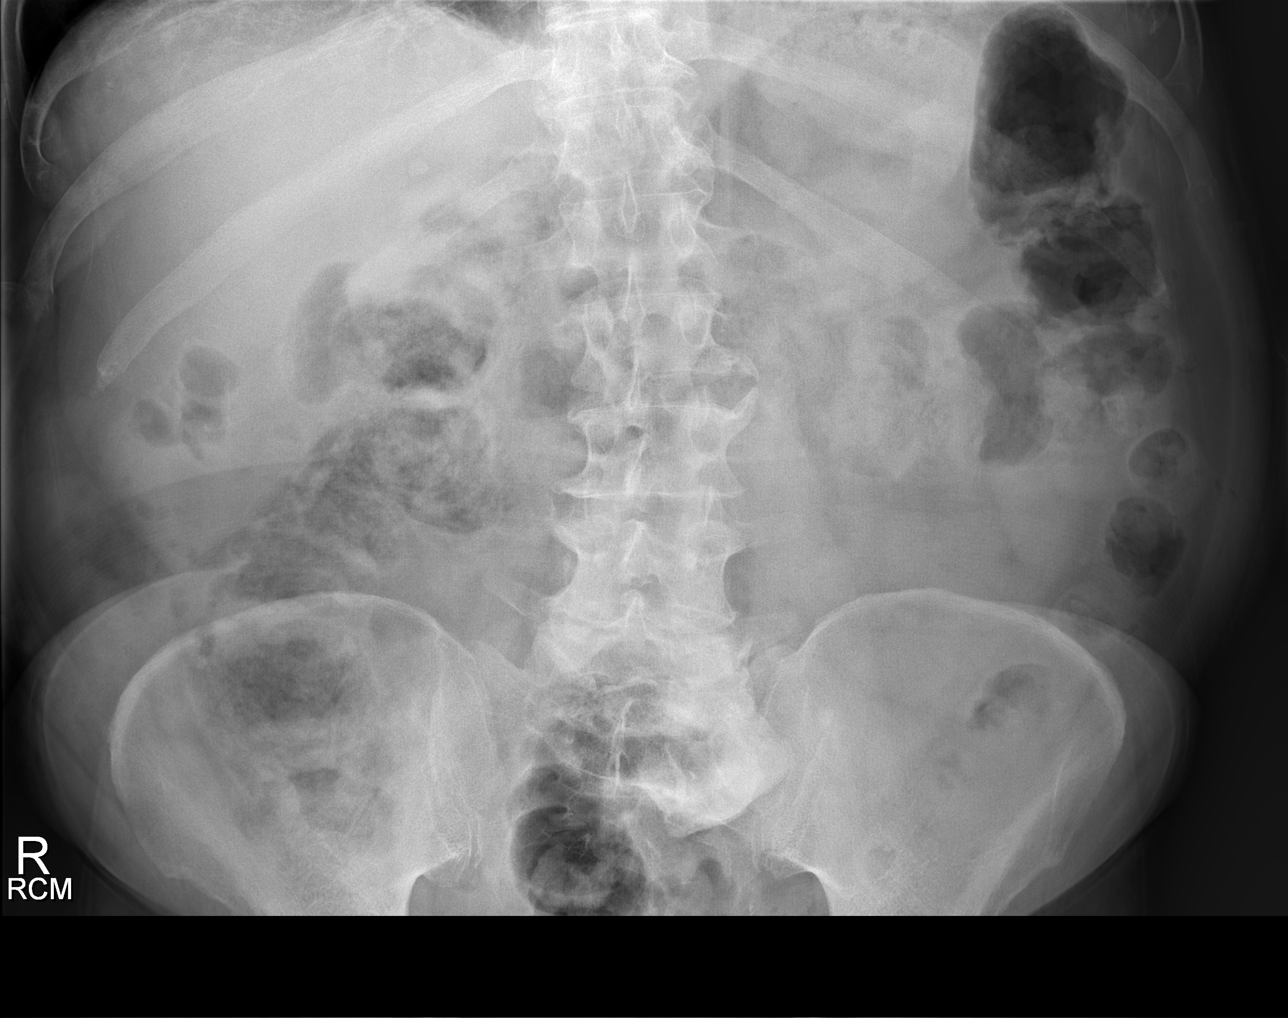

[3 of 3 positions shown; findings below may reference images not displayed]

FINDINGS: Negative for bowel obstruction.  No dilated bowel loops.
No free intraperitoneal gas, however the extreme top of the
diaphragm was not imaged.  Mild amount of stool in the colon,
similar to the prior study.  Large left-sided osteophyte at L5-S1
is unchanged.
IMPRESSION: Mild constipation without bowel obstruction.

## 2013-09-17 ENCOUNTER — Ambulatory Visit: Payer: Medicaid Other | Admitting: Pulmonary Disease

## 2013-12-17 ENCOUNTER — Other Ambulatory Visit (HOSPITAL_COMMUNITY): Payer: Self-pay

## 2013-12-17 ENCOUNTER — Other Ambulatory Visit (HOSPITAL_COMMUNITY): Payer: Self-pay | Admitting: Cardiology

## 2013-12-17 ENCOUNTER — Encounter (HOSPITAL_COMMUNITY): Payer: Self-pay | Admitting: Cardiology

## 2013-12-17 DIAGNOSIS — I10 Essential (primary) hypertension: Secondary | ICD-10-CM

## 2013-12-17 MED ORDER — AMLODIPINE BESYLATE 10 MG PO TABS: 10.0000 mg | ORAL_TABLET | Freq: Every day | ORAL | Status: DC

## 2014-01-14 ENCOUNTER — Inpatient Hospital Stay (HOSPITAL_COMMUNITY)
Admission: AD | Admit: 2014-01-14 | Discharge: 2014-01-17 | DRG: 682 | Disposition: A | Discharge status: Home / Self Care | Payer: Medicaid Other | Source: Other Acute Inpatient Hospital | Attending: Internal Medicine | Admitting: Internal Medicine

## 2014-01-14 ENCOUNTER — Encounter (HOSPITAL_COMMUNITY): Payer: Self-pay | Admitting: *Deleted

## 2014-01-14 ENCOUNTER — Inpatient Hospital Stay (HOSPITAL_COMMUNITY): Payer: Medicaid Other

## 2014-01-14 DIAGNOSIS — R0989 Other specified symptoms and signs involving the circulatory and respiratory systems: Secondary | ICD-10-CM

## 2014-01-14 DIAGNOSIS — R0600 Dyspnea, unspecified: Secondary | ICD-10-CM | POA: Diagnosis present

## 2014-01-14 DIAGNOSIS — I4729 Other ventricular tachycardia: Secondary | ICD-10-CM

## 2014-01-14 DIAGNOSIS — J441 Chronic obstructive pulmonary disease with (acute) exacerbation: Secondary | ICD-10-CM | POA: Diagnosis present

## 2014-01-14 DIAGNOSIS — F17200 Nicotine dependence, unspecified, uncomplicated: Secondary | ICD-10-CM | POA: Diagnosis present

## 2014-01-14 DIAGNOSIS — Z992 Dependence on renal dialysis: Secondary | ICD-10-CM

## 2014-01-14 DIAGNOSIS — I472 Ventricular tachycardia: Secondary | ICD-10-CM

## 2014-01-14 DIAGNOSIS — N2581 Secondary hyperparathyroidism of renal origin: Secondary | ICD-10-CM | POA: Diagnosis present

## 2014-01-14 DIAGNOSIS — Z9119 Patient's noncompliance with other medical treatment and regimen: Secondary | ICD-10-CM

## 2014-01-14 DIAGNOSIS — E785 Hyperlipidemia, unspecified: Secondary | ICD-10-CM | POA: Diagnosis present

## 2014-01-14 DIAGNOSIS — Z79899 Other long term (current) drug therapy: Secondary | ICD-10-CM | POA: Diagnosis not present

## 2014-01-14 DIAGNOSIS — Z91199 Patient's noncompliance with other medical treatment and regimen due to unspecified reason: Secondary | ICD-10-CM | POA: Diagnosis not present

## 2014-01-14 DIAGNOSIS — J9600 Acute respiratory failure, unspecified whether with hypoxia or hypercapnia: Secondary | ICD-10-CM | POA: Diagnosis present

## 2014-01-14 DIAGNOSIS — G4733 Obstructive sleep apnea (adult) (pediatric): Secondary | ICD-10-CM | POA: Diagnosis present

## 2014-01-14 DIAGNOSIS — F411 Generalized anxiety disorder: Secondary | ICD-10-CM | POA: Diagnosis present

## 2014-01-14 DIAGNOSIS — J9601 Acute respiratory failure with hypoxia: Secondary | ICD-10-CM | POA: Diagnosis present

## 2014-01-14 DIAGNOSIS — I120 Hypertensive chronic kidney disease with stage 5 chronic kidney disease or end stage renal disease: Secondary | ICD-10-CM | POA: Diagnosis present

## 2014-01-14 DIAGNOSIS — I5022 Chronic systolic (congestive) heart failure: Secondary | ICD-10-CM | POA: Diagnosis present

## 2014-01-14 DIAGNOSIS — B1920 Unspecified viral hepatitis C without hepatic coma: Secondary | ICD-10-CM | POA: Diagnosis present

## 2014-01-14 DIAGNOSIS — F329 Major depressive disorder, single episode, unspecified: Secondary | ICD-10-CM | POA: Diagnosis present

## 2014-01-14 DIAGNOSIS — D649 Anemia, unspecified: Secondary | ICD-10-CM | POA: Diagnosis present

## 2014-01-14 DIAGNOSIS — F3289 Other specified depressive episodes: Secondary | ICD-10-CM | POA: Diagnosis present

## 2014-01-14 DIAGNOSIS — I509 Heart failure, unspecified: Secondary | ICD-10-CM | POA: Diagnosis present

## 2014-01-14 DIAGNOSIS — N186 End stage renal disease: Secondary | ICD-10-CM

## 2014-01-14 DIAGNOSIS — I10 Essential (primary) hypertension: Secondary | ICD-10-CM | POA: Diagnosis present

## 2014-01-14 DIAGNOSIS — G8929 Other chronic pain: Secondary | ICD-10-CM | POA: Diagnosis present

## 2014-01-14 DIAGNOSIS — R0609 Other forms of dyspnea: Secondary | ICD-10-CM

## 2014-01-14 DIAGNOSIS — N184 Chronic kidney disease, stage 4 (severe): Secondary | ICD-10-CM

## 2014-01-14 DIAGNOSIS — R51 Headache: Secondary | ICD-10-CM

## 2014-01-14 DIAGNOSIS — M549 Dorsalgia, unspecified: Secondary | ICD-10-CM | POA: Diagnosis present

## 2014-01-14 DIAGNOSIS — R519 Headache, unspecified: Secondary | ICD-10-CM | POA: Diagnosis present

## 2014-01-14 DIAGNOSIS — I739 Peripheral vascular disease, unspecified: Secondary | ICD-10-CM | POA: Diagnosis present

## 2014-01-14 DIAGNOSIS — J449 Chronic obstructive pulmonary disease, unspecified: Secondary | ICD-10-CM

## 2014-01-14 DIAGNOSIS — R0602 Shortness of breath: Secondary | ICD-10-CM | POA: Diagnosis present

## 2014-01-14 DIAGNOSIS — N185 Chronic kidney disease, stage 5: Secondary | ICD-10-CM

## 2014-01-14 LAB — COMPREHENSIVE METABOLIC PANEL
ALBUMIN: 3.7 g/dL (ref 3.5–5.2)
ALK PHOS: 83 U/L (ref 39–117)
ALT: 26 U/L (ref 0–53)
AST: 48 U/L — AB (ref 0–37)
Anion gap: 23 — ABNORMAL HIGH (ref 5–15)
BILIRUBIN TOTAL: 0.3 mg/dL (ref 0.3–1.2)
BUN: 41 mg/dL — ABNORMAL HIGH (ref 6–23)
CHLORIDE: 86 meq/L — AB (ref 96–112)
CO2: 21 mEq/L (ref 19–32)
Calcium: 8.7 mg/dL (ref 8.4–10.5)
Creatinine, Ser: 6.48 mg/dL — ABNORMAL HIGH (ref 0.50–1.35)
GFR calc Af Amer: 10 mL/min — ABNORMAL LOW (ref >90)
GFR calc non Af Amer: 9 mL/min — ABNORMAL LOW (ref >90)
Glucose, Bld: 143 mg/dL — ABNORMAL HIGH (ref 70–99)
POTASSIUM: 4.7 meq/L (ref 3.7–5.3)
Sodium: 130 mEq/L — ABNORMAL LOW (ref 137–147)
Total Protein: 7.1 g/dL (ref 6.0–8.3)

## 2014-01-14 LAB — CBC
HCT: 32.4 % — ABNORMAL LOW (ref 39.0–52.0)
Hemoglobin: 11.2 g/dL — ABNORMAL LOW (ref 13.0–17.0)
MCH: 33.9 pg (ref 26.0–34.0)
MCHC: 34.6 g/dL (ref 30.0–36.0)
MCV: 98.2 fL (ref 78.0–100.0)
PLATELETS: 207 10*3/uL (ref 150–400)
RBC: 3.3 MIL/uL — ABNORMAL LOW (ref 4.22–5.81)
RDW: 15 % (ref 11.5–15.5)
WBC: 6.4 10*3/uL (ref 4.0–10.5)

## 2014-01-14 LAB — MRSA PCR SCREENING: MRSA BY PCR: NEGATIVE

## 2014-01-14 LAB — TROPONIN I

## 2014-01-14 MED ORDER — PANTOPRAZOLE SODIUM 40 MG PO TBEC
40.0000 mg | DELAYED_RELEASE_TABLET | Freq: Every day | ORAL | Status: DC
  Administered 2014-01-15 – 2014-01-17 (×3): 40 mg via ORAL
  Filled 2014-01-14 (×3): qty 1

## 2014-01-14 MED ORDER — AMLODIPINE BESYLATE 10 MG PO TABS
10.0000 mg | ORAL_TABLET | Freq: Every day | ORAL | Status: DC
  Filled 2014-01-14: qty 1

## 2014-01-14 MED ORDER — ALBUTEROL SULFATE (2.5 MG/3ML) 0.083% IN NEBU: 2.5000 mg | INHALATION_SOLUTION | RESPIRATORY_TRACT | Status: DC | PRN

## 2014-01-14 MED ORDER — CLONIDINE HCL 0.2 MG PO TABS
0.2000 mg | ORAL_TABLET | Freq: Two times a day (BID) | ORAL | Status: DC
  Administered 2014-01-14 – 2014-01-17 (×5): 0.2 mg via ORAL
  Filled 2014-01-14 (×7): qty 1

## 2014-01-14 MED ORDER — FUROSEMIDE 10 MG/ML IJ SOLN
40.0000 mg | Freq: Once | INTRAMUSCULAR | Status: AC
  Administered 2014-01-14: 40 mg via INTRAVENOUS
  Filled 2014-01-14: qty 4

## 2014-01-14 MED ORDER — ENOXAPARIN SODIUM 30 MG/0.3ML ~~LOC~~ SOLN
30.0000 mg | SUBCUTANEOUS | Status: DC
  Administered 2014-01-14 – 2014-01-16 (×3): 30 mg via SUBCUTANEOUS
  Filled 2014-01-14 (×4): qty 0.3

## 2014-01-14 MED ORDER — ACETAMINOPHEN 500 MG PO TABS: 500.0000 mg | ORAL_TABLET | Freq: Four times a day (QID) | ORAL | Status: DC | PRN

## 2014-01-14 MED ORDER — LEVOFLOXACIN IN D5W 500 MG/100ML IV SOLN
500.0000 mg | INTRAVENOUS | Status: DC
  Administered 2014-01-14: 500 mg via INTRAVENOUS
  Filled 2014-01-14 (×2): qty 100

## 2014-01-14 MED ORDER — ONDANSETRON HCL 4 MG/2ML IJ SOLN: 4.0000 mg | Freq: Four times a day (QID) | INTRAMUSCULAR | Status: DC | PRN

## 2014-01-14 MED ORDER — UMECLIDINIUM-VILANTEROL 62.5-25 MCG/INH IN AEPB: 1.0000 | INHALATION_SPRAY | Freq: Every day | RESPIRATORY_TRACT | Status: DC

## 2014-01-14 MED ORDER — OXYCODONE HCL 5 MG PO TABS
5.0000 mg | ORAL_TABLET | Freq: Four times a day (QID) | ORAL | Status: DC | PRN
  Administered 2014-01-14 – 2014-01-17 (×6): 5 mg via ORAL
  Filled 2014-01-14 (×6): qty 1

## 2014-01-14 MED ORDER — ALPRAZOLAM 0.25 MG PO TABS
0.2500 mg | ORAL_TABLET | Freq: Three times a day (TID) | ORAL | Status: DC | PRN
  Administered 2014-01-15 – 2014-01-17 (×7): 0.25 mg via ORAL
  Filled 2014-01-14 (×7): qty 1

## 2014-01-14 MED ORDER — FUROSEMIDE 80 MG PO TABS
80.0000 mg | ORAL_TABLET | Freq: Every day | ORAL | Status: DC
  Filled 2014-01-14: qty 1

## 2014-01-14 MED ORDER — DIPHENHYDRAMINE HCL 25 MG PO TABS
25.0000 mg | ORAL_TABLET | Freq: Four times a day (QID) | ORAL | Status: DC | PRN
  Administered 2014-01-16: 25 mg via ORAL
  Filled 2014-01-14 (×2): qty 1

## 2014-01-14 MED ORDER — METHYLPREDNISOLONE SODIUM SUCC 125 MG IJ SOLR
60.0000 mg | Freq: Three times a day (TID) | INTRAMUSCULAR | Status: DC
  Administered 2014-01-14 – 2014-01-15 (×2): 60 mg via INTRAVENOUS
  Filled 2014-01-14 (×5): qty 0.96

## 2014-01-14 MED ORDER — ONDANSETRON HCL 4 MG PO TABS: 4.0000 mg | ORAL_TABLET | Freq: Four times a day (QID) | ORAL | Status: DC | PRN

## 2014-01-14 MED ORDER — NICOTINE 21 MG/24HR TD PT24
21.0000 mg | MEDICATED_PATCH | TRANSDERMAL | Status: DC
  Administered 2014-01-14 – 2014-01-16 (×3): 21 mg via TRANSDERMAL
  Filled 2014-01-14 (×4): qty 1

## 2014-01-14 MED ORDER — IPRATROPIUM-ALBUTEROL 0.5-2.5 (3) MG/3ML IN SOLN
3.0000 mL | Freq: Four times a day (QID) | RESPIRATORY_TRACT | Status: DC
  Administered 2014-01-14 – 2014-01-17 (×11): 3 mL via RESPIRATORY_TRACT
  Filled 2014-01-14 (×12): qty 3

## 2014-01-14 MED ORDER — CALCITRIOL 0.5 MCG PO CAPS
0.5000 ug | ORAL_CAPSULE | Freq: Every day | ORAL | Status: DC
  Filled 2014-01-14: qty 1

## 2014-01-14 MED ORDER — GABAPENTIN 100 MG PO CAPS
100.0000 mg | ORAL_CAPSULE | Freq: Three times a day (TID) | ORAL | Status: DC
  Administered 2014-01-14 – 2014-01-16 (×4): 100 mg via ORAL
  Filled 2014-01-14 (×7): qty 1

## 2014-01-14 MED ORDER — NEBIVOLOL HCL 10 MG PO TABS
20.0000 mg | ORAL_TABLET | Freq: Every day | ORAL | Status: DC
  Filled 2014-01-14: qty 2

## 2014-01-14 MED ORDER — MORPHINE SULFATE 2 MG/ML IJ SOLN
1.0000 mg | INTRAMUSCULAR | Status: DC | PRN
  Administered 2014-01-14: 1 mg via INTRAVENOUS
  Filled 2014-01-14: qty 1

## 2014-01-14 MED ORDER — ROFLUMILAST 500 MCG PO TABS
500.0000 ug | ORAL_TABLET | Freq: Every day | ORAL | Status: DC
  Administered 2014-01-15 – 2014-01-17 (×3): 500 ug via ORAL
  Filled 2014-01-14 (×4): qty 1

## 2014-01-14 MED ORDER — HYDRALAZINE HCL 50 MG PO TABS
100.0000 mg | ORAL_TABLET | Freq: Three times a day (TID) | ORAL | Status: DC
  Administered 2014-01-14 – 2014-01-17 (×6): 100 mg via ORAL
  Filled 2014-01-14 (×10): qty 2

## 2014-01-14 MED ORDER — ISOSORBIDE MONONITRATE ER 60 MG PO TB24
60.0000 mg | ORAL_TABLET | Freq: Every day | ORAL | Status: DC
  Filled 2014-01-14 (×2): qty 1

## 2014-01-14 NOTE — H&P (Signed)
[facility] History and Physical  [name] ZOX:[id number] DOB: 25-Dec-1960 DOA: 01/14/2014  Referring physician: transfer from [facility] PCP: Wilmer Floor., MD  Chief Complaint: sob  HPI: [name] is a 53 y.o. male with prior h/o OSA, diastolic heart failure, ESRD ON HD, TTS, last HD on Saturday, PAD, comes in from [facility] for worsening sob since 2 to3 weeks. Patient also reports missing his medications for the last few weeks. He continues to smoke. He reports headache and non compliance to CPAP machine. His breathing improved with one dose of solumedrol and duo neb treatment. His CXR shows hypoventilated lungs.     Review of Systems:  Constitutional:  No weight loss, night sweats, Fevers, chills, fatigue.  HEENT:  No headaches, Difficulty swallowing,Tooth/dental problems,Sore throat,  No sneezing, itching, ear ache, nasal congestion, post nasal drip,  Cardio-vascular:  No chest pain, Orthopnea, PND, swelling in lower extremities, anasarca, dizziness, palpitations  GI:  No heartburn, indigestion, abdominal pain, nausea, vomiting, diarrhea, change in bowel habits, loss of appetite  Resp:  Sob since 2 weeks. Associated with orthopnea.  Skin:  no rash or lesions.  GU:  no dysuria, change in color of urine, no urgency or frequency. No flank pain.  Musculoskeletal:  No joint pain or swelling. No decreased range of motion. No back pain.  Psych:  No change in mood or affect. No depression or anxiety. No memory loss.   Past Medical History  Diagnosis Date  . Hypertension   . Systolic heart failure     EF 81-19%  . Hyperlipidemia   . COPD (chronic obstructive pulmonary disease)   . Peripheral vascular disease   . Pneumonia 1980's  . OSA on CPAP   . ETOH abuse   . History of stomach ulcers   . Arthritis     "arms"  . Chronic lower back pain   . Anxiety   . Depression   . Adult ADHD     "never diagnosed; my son was; I think I've got it too"    . Chronic kidney disease (CKD), stage III (moderate)     baseline cr 2.3  . Renal cyst     left, complex  . Renal artery stenosis     a. s/p BMS to inferior branch of right renal artery 12/2011. On left, 3 renal arteries were noted, 2 of them were very small in size and subtotally occluded  . Anginal pain   . CHF (congestive heart failure)   . Seasonal allergies   . Hepatitis     "I think it was C"   Past Surgical History  Procedure Laterality Date  . Renal artery stent  01/05/2012    right inferior  . No past surgeries    . Av fistula placement Left 10/10/2012    Procedure: ARTERIOVENOUS (AV) FISTULA CREATION;  Surgeon: Sherren Kerns, MD;  Location: [facility] OR;  Service: Vascular;  Laterality: Left;  Brachio-cephalic   Social History:  reports that he quit smoking about 13 months ago. His smoking use included Cigarettes. He has a 4 pack-year smoking history. His smokeless tobacco use includes Snuff and Chew. He reports that he drinks alcohol. He reports that he does not use illicit drugs.  No Known Allergies  Family History  Problem Relation Age of Onset  . Brain cancer Mother   . Pancreatic cancer Father   . Heart failure Paternal Uncle   . Heart disease Paternal Uncle   . Heart failure Paternal Grandfather   .  Heart disease Paternal Grandfather      Prior to Admission medications   Medication Sig Start Date End Date Taking? Authorizing Provider  acetaminophen (TYLENOL) 500 MG tablet Take 1,000 mg by mouth every 6 (six) hours as needed (pain).    Yes Historical Provider, MD  albuterol (PROVENTIL HFA;VENTOLIN HFA) 108 (90 BASE) MCG/ACT inhaler Inhale 2 puffs into the lungs every 6 (six) hours as needed for wheezing or shortness of breath.   Yes Historical Provider, MD  albuterol (PROVENTIL) (2.5 MG/3ML) 0.083% nebulizer solution Take 3 mLs (2.5 mg total) by nebulization every 6 (six) hours as needed. For wheezing. 02/15/13  Yes Coralyn Helling, MD  ALPRAZolam Prudy Feeler) 0.5 MG tablet Take  0.75 mg by mouth 3 (three) times daily as needed for anxiety.   Yes Historical Provider, MD  amLODipine (NORVASC) 10 MG tablet Take 10 mg by mouth at bedtime.   Yes Historical Provider, MD  calcium acetate (PHOSLO) 667 MG capsule Take 667 mg by mouth daily with supper.   Yes Historical Provider, MD  calcium carbonate (TUMS EX) 750 MG chewable tablet Chew 1 tablet by mouth 2 (two) times daily as needed for heartburn.    Yes Historical Provider, MD  cloNIDine (CATAPRES) 0.2 MG tablet Take 0.2 mg by mouth 2 (two) times daily.   Yes Historical Provider, MD  diltiazem (CARDIZEM CD) 120 MG 24 hr capsule Take 120 mg by mouth at bedtime.    Yes Historical Provider, MD  diphenhydrAMINE (BENADRYL) 25 MG tablet Take 25 mg by mouth every 6 (six) hours as needed for itching.    Yes Historical Provider, MD  hydrALAZINE (APRESOLINE) 100 MG tablet Take 100 mg by mouth 3 (three) times daily.   Yes Historical Provider, MD  isosorbide mononitrate (IMDUR) 60 MG 24 hr tablet Take 60 mg by mouth daily as needed (blood pressure close to 200).   Yes Historical Provider, MD  Multiple Vitamin (MULTIVITAMIN WITH MINERALS) TABS tablet Take 1 tablet by mouth daily.   Yes Historical Provider, MD  roflumilast (DALIRESP) 500 MCG TABS tablet Take 500 mcg by mouth daily.   Yes Historical Provider, MD  tiotropium (SPIRIVA) 18 MCG inhalation capsule Place 18 mcg into inhaler and inhale daily.   Yes Historical Provider, MD  Valerian Root 500 MG CAPS Take 500 mg by mouth daily as needed (anxiety).    Yes Historical Provider, MD  vitamin C (ASCORBIC ACID) 500 MG tablet Take 500 mg by mouth daily.   Yes Historical Provider, MD   Physical Exam: Filed Vitals:   01/14/14 1500  BP: 164/84  Pulse: 92  Temp: 97.7 F (36.5 C)  TempSrc: Oral  Height:  (1.702 m)  Weight: 69.5 kg (153 lb 3.5 oz)  SpO2: 99%    Wt Readings from Last 3 Encounters:  01/14/14 69.5 kg (153 lb 3.5 oz)  02/15/13 73.483 kg (162 lb)  10/10/12 73.483 kg  (162 lb)    General:  Appears calm and comfortable Eyes: PERRL, normal lids, irises & conjunctiva Neck: no LAD, masses or thyromegaly Cardiovascular: RRR, no m/r/g. No LE edema. Telemetry: SR, no arrhythmias  Respiratory: decreased air entry at bases.  Abdomen: soft, ntnd Skin: no rash or induration seen on limited exam Musculoskeletal: grossly normal tone BUE/BLE Neurologic: grossly non-focal.          Labs on Admission:  Basic Metabolic Panel:  Recent Labs Lab 01/14/14 1724  NA 130*  K 4.7  CL 86*  CO2 21  GLUCOSE 143*  BUN 41*  CREATININE 6.48*  CALCIUM 8.7   Liver Function Tests:  Recent Labs Lab 01/14/14 1724  AST 48*  ALT 26  ALKPHOS 83  BILITOT 0.3  PROT 7.1  ALBUMIN 3.7   No results found for this basename: LIPASE, AMYLASE,  in the last 168 hours No results found for this basename: AMMONIA,  in the last 168 hours CBC:  Recent Labs Lab 01/14/14 1724  WBC 6.4  HGB 11.2*  HCT 32.4*  MCV 98.2  PLT 207   Cardiac Enzymes: No results found for this basename: CKTOTAL, CKMB, CKMBINDEX, TROPONINI,  in the last 168 hours  BNP (last 3 results) No results found for this basename: PROBNP,  in the last 8760 hours CBG: No results found for this basename: GLUCAP,  in the last 168 hours  Radiological Exams on Admission: Dg Chest 2 View  01/14/2014   CLINICAL DATA:  Chest pain, shortness of breath  EXAM: CHEST  2 VIEW  COMPARISON:  01/14/2014 at 10:48 a.m. at [facility], available for comparison on [facility]  FINDINGS: The heart size is at upper limits of normal. Both lungs are hypoaerated with minimal bibasilar atelectasis reidentified. The visualized skeletal structures are unremarkable.  IMPRESSION: No significant change since prior exam obtained earlier today at [facility] at 10:48 a.m., available for comparison on [facility].   Electronically Signed   By: Christiana Pellant M.D.   On: 01/14/2014 18:04    EKG: sinus with prolonged qt  interval.   Assessment/Plan Active Problems:   Hypertension   Congestive heart failure (CHF)   COPD (chronic obstructive pulmonary disease)   Peripheral vascular disease   Chronic systolic heart failure   OSA (obstructive sleep apnea)   Chronic kidney disease, stage IV (severe)   Dyspnea   Acute respiratory failure with hypoxemia: Probably secondary to non compliance to medications and COPD exacerbation.  Started patient on IV solumedrol and duo nebs every 6 hours and albuterol prn.  IV levaquin, CPAP at night for OSA.  One dose of lasix and resume lasix as per home dose.  Nasal oxygen to keep sats >90%   ESRD on HD: - TTS, sees Dr Marisue Humble. Will call renal for evaluation.    Hypertension: Sub optimal from missing his meds.  Resume home medications.    Acute on chronic CHF: Last EF was 55%.  Resume lasix, and get echocardiogram.        Code Status: full code DVT Prophylaxis: lovenox.  Family Communication: none at bedside.  Disposition Plan: admitted to step down Time spent: 55 minutes.   [facility] Pager [phone]  **Disclaimer: This note may have been dictated with voice recognition software. Similar sounding words can inadvertently be transcribed and this note may contain transcription errors which may not have been corrected upon publication of note.**

## 2014-01-14 NOTE — Progress Notes (Signed)
Pt stated that he did not want to wear CPAP tonight. After trying it, he stated, "it was too much covering his face". Explained that we can try it again whenever he would like to too. He agreed.

## 2014-01-15 ENCOUNTER — Inpatient Hospital Stay (HOSPITAL_COMMUNITY): Payer: Medicaid Other

## 2014-01-15 DIAGNOSIS — J9600 Acute respiratory failure, unspecified whether with hypoxia or hypercapnia: Secondary | ICD-10-CM

## 2014-01-15 DIAGNOSIS — R519 Headache, unspecified: Secondary | ICD-10-CM | POA: Diagnosis present

## 2014-01-15 DIAGNOSIS — I4729 Other ventricular tachycardia: Secondary | ICD-10-CM

## 2014-01-15 DIAGNOSIS — J9601 Acute respiratory failure with hypoxia: Secondary | ICD-10-CM | POA: Diagnosis present

## 2014-01-15 DIAGNOSIS — I5022 Chronic systolic (congestive) heart failure: Secondary | ICD-10-CM

## 2014-01-15 DIAGNOSIS — R0609 Other forms of dyspnea: Secondary | ICD-10-CM

## 2014-01-15 DIAGNOSIS — I059 Rheumatic mitral valve disease, unspecified: Secondary | ICD-10-CM

## 2014-01-15 DIAGNOSIS — I472 Ventricular tachycardia: Secondary | ICD-10-CM

## 2014-01-15 DIAGNOSIS — R51 Headache: Secondary | ICD-10-CM

## 2014-01-15 DIAGNOSIS — R0989 Other specified symptoms and signs involving the circulatory and respiratory systems: Secondary | ICD-10-CM

## 2014-01-15 DIAGNOSIS — N185 Chronic kidney disease, stage 5: Secondary | ICD-10-CM

## 2014-01-15 LAB — CBC
HCT: 31.7 % — ABNORMAL LOW (ref 39.0–52.0)
Hemoglobin: 10.9 g/dL — ABNORMAL LOW (ref 13.0–17.0)
MCH: 33.7 pg (ref 26.0–34.0)
MCHC: 34.4 g/dL (ref 30.0–36.0)
MCV: 98.1 fL (ref 78.0–100.0)
PLATELETS: 189 10*3/uL (ref 150–400)
RBC: 3.23 MIL/uL — ABNORMAL LOW (ref 4.22–5.81)
RDW: 14.8 % (ref 11.5–15.5)
WBC: 9.9 10*3/uL (ref 4.0–10.5)

## 2014-01-15 LAB — URINALYSIS, ROUTINE W REFLEX MICROSCOPIC
BILIRUBIN URINE: NEGATIVE
GLUCOSE, UA: NEGATIVE mg/dL
Hgb urine dipstick: NEGATIVE
KETONES UR: NEGATIVE mg/dL
LEUKOCYTES UA: NEGATIVE
Nitrite: NEGATIVE
PH: 5.5 (ref 5.0–8.0)
Protein, ur: 100 mg/dL — AB
Specific Gravity, Urine: 1.017 (ref 1.005–1.030)
Urobilinogen, UA: 0.2 mg/dL (ref 0.0–1.0)

## 2014-01-15 LAB — BASIC METABOLIC PANEL
ANION GAP: 25 — AB (ref 5–15)
BUN: 54 mg/dL — ABNORMAL HIGH (ref 6–23)
CALCIUM: 8.5 mg/dL (ref 8.4–10.5)
CO2: 17 mEq/L — ABNORMAL LOW (ref 19–32)
CREATININE: 7.04 mg/dL — AB (ref 0.50–1.35)
Chloride: 87 mEq/L — ABNORMAL LOW (ref 96–112)
GFR, EST AFRICAN AMERICAN: 9 mL/min — AB (ref >90)
GFR, EST NON AFRICAN AMERICAN: 8 mL/min — AB (ref >90)
Glucose, Bld: 164 mg/dL — ABNORMAL HIGH (ref 70–99)
Potassium: 4.5 mEq/L (ref 3.7–5.3)
Sodium: 129 mEq/L — ABNORMAL LOW (ref 137–147)

## 2014-01-15 LAB — URINE MICROSCOPIC-ADD ON

## 2014-01-15 MED ORDER — NEBIVOLOL HCL 10 MG PO TABS
20.0000 mg | ORAL_TABLET | Freq: Every day | ORAL | Status: DC
  Administered 2014-01-15 – 2014-01-17 (×3): 20 mg via ORAL
  Filled 2014-01-15 (×3): qty 2

## 2014-01-15 MED ORDER — DILTIAZEM HCL ER COATED BEADS 120 MG PO CP24
120.0000 mg | ORAL_CAPSULE | Freq: Every day | ORAL | Status: DC
  Administered 2014-01-15 – 2014-01-16 (×2): 120 mg via ORAL
  Filled 2014-01-15 (×3): qty 1

## 2014-01-15 MED ORDER — LIDOCAINE HCL (PF) 1 % IJ SOLN: 5.0000 mL | INTRAMUSCULAR | Status: DC | PRN

## 2014-01-15 MED ORDER — CALCIUM CARBONATE ANTACID 500 MG PO CHEW
400.0000 mg | CHEWABLE_TABLET | Freq: Three times a day (TID) | ORAL | Status: DC | PRN
  Administered 2014-01-15 – 2014-01-17 (×2): 400 mg via ORAL
  Filled 2014-01-15: qty 2

## 2014-01-15 MED ORDER — PENTAFLUOROPROP-TETRAFLUOROETH EX AERO: 1.0000 "application " | INHALATION_SPRAY | CUTANEOUS | Status: DC | PRN

## 2014-01-15 MED ORDER — MORPHINE SULFATE 2 MG/ML IJ SOLN
1.0000 mg | INTRAMUSCULAR | Status: DC | PRN
  Administered 2014-01-15 (×3): 2 mg via INTRAVENOUS
  Filled 2014-01-15 (×2): qty 1

## 2014-01-15 MED ORDER — MORPHINE SULFATE 2 MG/ML IJ SOLN
INTRAMUSCULAR | Status: AC
  Filled 2014-01-15: qty 1

## 2014-01-15 MED ORDER — NEBIVOLOL HCL 10 MG PO TABS: 30.0000 mg | ORAL_TABLET | Freq: Every day | ORAL | Status: DC

## 2014-01-15 MED ORDER — ALTEPLASE 2 MG IJ SOLR
2.0000 mg | Freq: Once | INTRAMUSCULAR | Status: DC | PRN
  Filled 2014-01-15: qty 2

## 2014-01-15 MED ORDER — RENA-VITE PO TABS
1.0000 | ORAL_TABLET | Freq: Every day | ORAL | Status: DC
  Administered 2014-01-15 – 2014-01-16 (×2): 1 via ORAL
  Filled 2014-01-15 (×4): qty 1

## 2014-01-15 MED ORDER — SODIUM CHLORIDE 0.9 % IV SOLN: 100.0000 mL | INTRAVENOUS | Status: DC | PRN

## 2014-01-15 MED ORDER — METHYLPREDNISOLONE SODIUM SUCC 125 MG IJ SOLR
60.0000 mg | Freq: Two times a day (BID) | INTRAMUSCULAR | Status: DC
  Administered 2014-01-16: 60 mg via INTRAVENOUS
  Filled 2014-01-15 (×4): qty 0.96

## 2014-01-15 MED ORDER — AMLODIPINE BESYLATE 10 MG PO TABS
10.0000 mg | ORAL_TABLET | Freq: Every day | ORAL | Status: DC
  Administered 2014-01-15 – 2014-01-16 (×2): 10 mg via ORAL
  Filled 2014-01-15 (×2): qty 1

## 2014-01-15 MED ORDER — CALCIUM ACETATE 667 MG PO CAPS
667.0000 mg | ORAL_CAPSULE | Freq: Three times a day (TID) | ORAL | Status: DC
  Administered 2014-01-15 – 2014-01-17 (×6): 667 mg via ORAL
  Filled 2014-01-15 (×9): qty 1

## 2014-01-15 MED ORDER — ISOSORBIDE MONONITRATE ER 30 MG PO TB24
30.0000 mg | ORAL_TABLET | Freq: Two times a day (BID) | ORAL | Status: DC
  Administered 2014-01-15 – 2014-01-17 (×4): 30 mg via ORAL
  Filled 2014-01-15 (×6): qty 1

## 2014-01-15 MED ORDER — LIDOCAINE-PRILOCAINE 2.5-2.5 % EX CREA
1.0000 "application " | TOPICAL_CREAM | CUTANEOUS | Status: DC | PRN
  Filled 2014-01-15: qty 5

## 2014-01-15 MED ORDER — CALCIUM CARBONATE ANTACID 500 MG PO CHEW: 2.0000 | CHEWABLE_TABLET | Freq: Two times a day (BID) | ORAL | Status: DC | PRN

## 2014-01-15 MED ORDER — DARBEPOETIN ALFA-POLYSORBATE 25 MCG/0.42ML IJ SOLN
25.0000 ug | Freq: Once | INTRAMUSCULAR | Status: DC
  Filled 2014-01-15: qty 0.42

## 2014-01-15 MED ORDER — HEPARIN SODIUM (PORCINE) 1000 UNIT/ML DIALYSIS: 1000.0000 [IU] | INTRAMUSCULAR | Status: DC | PRN

## 2014-01-15 MED ORDER — NEPRO/CARBSTEADY PO LIQD
237.0000 mL | ORAL | Status: DC | PRN
  Filled 2014-01-15: qty 237

## 2014-01-15 MED ORDER — NEPRO/CARBSTEADY PO LIQD: 237.0000 mL | Freq: Every day | ORAL | Status: DC

## 2014-01-15 NOTE — Consult Note (Signed)
Indication for Consultation:  Management of ESRD/hemodialysis; anemia, hypertension/volume and secondary hyperparathyroidism  HPI: [name] is a 53 y.o. male who presented to [facility] yesterday with complaints of a headache gradually worsening over the past week. He receive HD TTS @ [place]. He had been taking ASA for the headache with minimal relief. Last HD Saturday, he has not been running full treatments, reports cramping causing him to sign off. He reports SOB is a chronic issue and he wears o2 Q HD treatment, he does not use his CPAP. He reports his BP has been running high lately in the 170s/180s, he had been running 120s at home. Denies missing any BP meds. Will arrange for HD today.   Past Medical History  Diagnosis Date  . Hypertension   . Systolic heart failure     EF 16-10%  . Hyperlipidemia   . COPD (chronic obstructive pulmonary disease)   . Peripheral vascular disease   . Pneumonia 1980's  . OSA on CPAP   . ETOH abuse   . History of stomach ulcers   . Arthritis     "arms"  . Chronic lower back pain   . Anxiety   . Depression   . Adult ADHD     "never diagnosed; my son was; I think I've got it too"  . Chronic kidney disease (CKD), stage III (moderate)     baseline cr 2.3  . Renal cyst     left, complex  . Renal artery stenosis     a. s/p BMS to inferior branch of right renal artery 12/2011. On left, 3 renal arteries were noted, 2 of them were very small in size and subtotally occluded  . Anginal pain   . CHF (congestive heart failure)   . Seasonal allergies   . Hepatitis     "I think it was C"   Past Surgical History  Procedure Laterality Date  . Renal artery stent  01/05/2012    right inferior  . No past surgeries    . Av fistula placement Left 10/10/2012    Procedure: ARTERIOVENOUS (AV) FISTULA CREATION;  Surgeon: Sherren Kerns, MD;  Location: [facility] OR;  Service: Vascular;  Laterality: Left;  Brachio-cephalic   Family History  Problem  Relation Age of Onset  . Brain cancer Mother   . Pancreatic cancer Father   . Heart failure Paternal Uncle   . Heart disease Paternal Uncle   . Heart failure Paternal Grandfather   . Heart disease Paternal Grandfather    Social History:  reports that he quit smoking about 13 months ago. His smoking use included Cigarettes. He has a 4 pack-year smoking history. His smokeless tobacco use includes Snuff and Chew. He reports that he drinks alcohol. He reports that he does not use illicit drugs. He continues to smoke, cut back from 2 ppd to about 5 cigarettes.  No Known Allergies Prior to Admission medications   Medication Sig Start Date End Date Taking? Authorizing Provider  acetaminophen (TYLENOL) 500 MG tablet Take 1,000 mg by mouth every 6 (six) hours as needed (pain).    Yes Historical Provider, MD  albuterol (PROVENTIL HFA;VENTOLIN HFA) 108 (90 BASE) MCG/ACT inhaler Inhale 2 puffs into the lungs every 6 (six) hours as needed for wheezing or shortness of breath.   Yes Historical Provider, MD  albuterol (PROVENTIL) (2.5 MG/3ML) 0.083% nebulizer solution Take 3 mLs (2.5 mg total) by nebulization every 6 (six) hours as needed. For wheezing. 02/15/13  Yes Vineet  Craige Cotta, MD  ALPRAZolam Prudy Feeler) 0.5 MG tablet Take 0.75 mg by mouth 3 (three) times daily as needed for anxiety.   Yes Historical Provider, MD  amLODipine (NORVASC) 10 MG tablet Take 10 mg by mouth at bedtime.   Yes Historical Provider, MD  calcium acetate (PHOSLO) 667 MG capsule Take 667 mg by mouth daily with supper.   Yes Historical Provider, MD  calcium carbonate (TUMS EX) 750 MG chewable tablet Chew 1 tablet by mouth 2 (two) times daily as needed for heartburn.    Yes Historical Provider, MD  cloNIDine (CATAPRES) 0.2 MG tablet Take 0.2 mg by mouth 2 (two) times daily.   Yes Historical Provider, MD  diltiazem (CARDIZEM CD) 120 MG 24 hr capsule Take 120 mg by mouth at bedtime.    Yes Historical Provider, MD  diphenhydrAMINE (BENADRYL) 25 MG  tablet Take 25 mg by mouth every 6 (six) hours as needed for itching.    Yes Historical Provider, MD  hydrALAZINE (APRESOLINE) 100 MG tablet Take 100 mg by mouth 3 (three) times daily.   Yes Historical Provider, MD  isosorbide mononitrate (IMDUR) 60 MG 24 hr tablet Take 60 mg by mouth daily as needed (blood pressure close to 200).   Yes Historical Provider, MD  Multiple Vitamin (MULTIVITAMIN WITH MINERALS) TABS tablet Take 1 tablet by mouth daily.   Yes Historical Provider, MD  roflumilast (DALIRESP) 500 MCG TABS tablet Take 500 mcg by mouth daily.   Yes Historical Provider, MD  tiotropium (SPIRIVA) 18 MCG inhalation capsule Place 18 mcg into inhaler and inhale daily.   Yes Historical Provider, MD  Valerian Root 500 MG CAPS Take 500 mg by mouth daily as needed (anxiety).    Yes Historical Provider, MD  vitamin C (ASCORBIC ACID) 500 MG tablet Take 500 mg by mouth daily.   Yes Historical Provider, MD   Current Facility-Administered Medications  Medication Dose Route Frequency Provider Last Rate Last Dose  . acetaminophen (TYLENOL) tablet 500 mg  500 mg Oral Q6H PRN Kathlen Mody, MD      . albuterol (PROVENTIL) (2.5 MG/3ML) 0.083% nebulizer solution 2.5 mg  2.5 mg Nebulization Q2H PRN Kathlen Mody, MD      . ALPRAZolam Prudy Feeler) tablet 0.25 mg  0.25 mg Oral TID PRN Kathlen Mody, MD   0.25 mg at 01/15/14 0521  . amLODipine (NORVASC) tablet 10 mg  10 mg Oral Daily Kathlen Mody, MD      . calcitRIOL (ROCALTROL) capsule 0.5 mcg  0.5 mcg Oral Daily Kathlen Mody, MD      . calcium carbonate (TUMS - dosed in mg elemental calcium) chewable tablet 400 mg of elemental calcium  400 mg of elemental calcium Oral TID PRN Roma Kayser Schorr, NP      . cloNIDine (CATAPRES) tablet 0.2 mg  0.2 mg Oral BID Kathlen Mody, MD   0.2 mg at 01/14/14 2249  . darbepoetin (ARANESP) injection 25 mcg  25 mcg Intravenous Once Derrill Kay, NP      . diltiazem (CARDIZEM CD) 24 hr capsule 120 mg  120 mg Oral QHS Russella Dar, NP      . diphenhydrAMINE (BENADRYL) tablet 25 mg  25 mg Oral Q6H PRN Kathlen Mody, MD      . enoxaparin (LOVENOX) injection 30 mg  30 mg Subcutaneous Q24H Kathlen Mody, MD   30 mg at 01/14/14 2250  . gabapentin (NEURONTIN) capsule 100 mg  100 mg Oral TID Kathlen Mody, MD   100 mg  at 01/14/14 2249  . hydrALAZINE (APRESOLINE) tablet 100 mg  100 mg Oral TID Kathlen Mody, MD   100 mg at 01/14/14 2249  . ipratropium-albuterol (DUONEB) 0.5-2.5 (3) MG/3ML nebulizer solution 3 mL  3 mL Nebulization Q6H Kathlen Mody, MD   3 mL at 01/15/14 0839  . isosorbide mononitrate (IMDUR) 24 hr tablet 30 mg  30 mg Oral BID Russella Dar, NP      . levofloxacin (LEVAQUIN) IVPB 500 mg  500 mg Intravenous Q48H Kathlen Mody, MD   500 mg at 01/14/14 2249  . methylPREDNISolone sodium succinate (SOLU-MEDROL) 125 mg/2 mL injection 60 mg  60 mg Intravenous Q12H Russella Dar, NP      . morphine 2 MG/ML injection 1-2 mg  1-2 mg Intravenous Q2H PRN Russella Dar, NP      . nebivolol (BYSTOLIC) tablet 20 mg  20 mg Oral Daily Kathlen Mody, MD      . nicotine (NICODERM CQ - dosed in mg/24 hours) patch 21 mg  21 mg Transdermal Q24H Kathlen Mody, MD   21 mg at 01/14/14 1906  . ondansetron (ZOFRAN) tablet 4 mg  4 mg Oral Q6H PRN Kathlen Mody, MD       Or  . ondansetron (ZOFRAN) injection 4 mg  4 mg Intravenous Q6H PRN Kathlen Mody, MD      . oxyCODONE (Oxy IR/ROXICODONE) immediate release tablet 5 mg  5 mg Oral Q6H PRN Kathlen Mody, MD   5 mg at 01/14/14 2314  . pantoprazole (PROTONIX) EC tablet 40 mg  40 mg Oral Daily Kathlen Mody, MD      . roflumilast (DALIRESP) tablet 500 mcg  500 mcg Oral Daily Kathlen Mody, MD      . Umeclidinium-Vilanterol 62.5-25 MCG/INH AEPB 1 puff  1 puff Inhalation Daily Kathlen Mody, MD       Labs: Basic Metabolic Panel:  Recent Labs Lab 01/14/14 1724 01/15/14 0350  NA 130* 129*  K 4.7 4.5  CL 86* 87*  CO2 21 17*  GLUCOSE 143* 164*  BUN 41* 54*  CREATININE 6.48* 7.04*  CALCIUM  8.7 8.5   Liver Function Tests:  Recent Labs Lab 01/14/14 1724  AST 48*  ALT 26  ALKPHOS 83  BILITOT 0.3  PROT 7.1  ALBUMIN 3.7   No results found for this basename: LIPASE, AMYLASE,  in the last 168 hours No results found for this basename: AMMONIA,  in the last 168 hours CBC:  Recent Labs Lab 01/14/14 1724 01/15/14 0350  WBC 6.4 9.9  HGB 11.2* 10.9*  HCT 32.4* 31.7*  MCV 98.2 98.1  PLT 207 189   Cardiac Enzymes:  Recent Labs Lab 01/14/14 2010  TROPONINI <0.30   CBG: No results found for this basename: GLUCAP,  in the last 168 hours Iron Studies: No results found for this basename: IRON, TIBC, TRANSFERRIN, FERRITIN,  in the last 72 hours Studies/Results: Dg Chest 2 View  01/14/2014   CLINICAL DATA:  Chest pain, shortness of breath  EXAM: CHEST  2 VIEW  COMPARISON:  01/14/2014 at 10:48 a.m. at [facility], available for comparison on [facility]  FINDINGS: The heart size is at upper limits of normal. Both lungs are hypoaerated with minimal bibasilar atelectasis reidentified. The visualized skeletal structures are unremarkable.  IMPRESSION: No significant change since prior exam obtained earlier today at [facility] at 10:48 a.m., available for comparison on [facility].   Electronically Signed   By: Lucio Edward.D.  On: 01/14/2014 18:04   Ct Head Wo Contrast  01/15/2014   CLINICAL DATA:  Severe right occipital headache for 1 week with dizziness.  EXAM: CT HEAD WITHOUT CONTRAST  TECHNIQUE: Contiguous axial images were obtained from the base of the skull through the vertex without intravenous contrast.  COMPARISON:  Prior CT from 01/14/2014  FINDINGS: Diffuse prominence of the CSF containing spaces is compatible with generalized cerebral atrophy. Scattered and confluent hypodensity within the periventricular and deep white matter both cerebral hemispheres is most consistent with mild chronic small vessel ischemic disease.  No acute intracranial hemorrhage or  infarct. No mass lesion or midline shift. No hydrocephalus. No extra-axial fluid collection.  Calvarium is intact. No acute abnormality seen within the scalp soft tissues. Orbital soft tissues within normal limits.  IMPRESSION: 1. No acute intracranial abnormality. No findings to explain headaches. 2. Atrophy with chronic microvascular ischemic disease.   Electronically Signed   By: Rise Mu M.D.   On: 01/15/2014 06:33    Review of Systems: Gen: Denies any fever, chills, sweats, anorexia, fatigue, weakness, malaise, weight loss, and sleep disorder HEENT: No visual complaints, No history of Retinopathy. Normal external appearance No Epistaxis or Sore throat. No sinusitis.   CV: Reports orthopnea. Denies chest pain, angina, palpitations, syncope, PND, peripheral edema, and claudication. Resp: Reports dyspnea at rest, dyspnea with exercise. Denies cough, sputum, wheezing, coughing up blood, and pleurisy. GI: Denies vomiting blood, jaundice, and fecal incontinence.   Denies dysphagia or odynophagia. GU : Denies urinary burning, blood in urine, urinary frequency, urinary hesitancy, nocturnal urination, and urinary incontinence.   MS: Reports chronic back and leg pain, on oxycodone.  Denies leg weakness Derm: Denies rash, itching, dry skin, hives, moles, warts, or unhealing ulcers.  Psych: Reports anxiety. Denies depression, memory loss, suicidal ideation, hallucinations, paranoia, and confusion. Heme: Denies bruising, bleeding, and enlarged lymph nodes. Neuro: Reports headache x 1 week.  No diplopia. No dysarthria.  No dysphasia.  No history of CVA.  No Seizures. No paresthesias.  No weakness. Endocrine No DM.  No Thyroid disease.  No Adrenal disease.  Physical Exam: Filed Vitals:   01/15/14 0127 01/15/14 0502 01/15/14 0721 01/15/14 0839  BP: 158/89 171/67 163/91   Pulse: 96  99   Temp:  97.5 F (36.4 C) 97.9 F (36.6 C)   TempSrc:  Oral    Resp: 16  18   Height:      Weight:       SpO2: 100%  100% 100%     General: Well developed, well nourished, in no acute distress.  Head: Normocephalic, atraumatic, sclera non-icteric, mucus membranes are moist Neck: Supple. JVD not elevated. Lungs: Dim. Faint expiratory wheeze. occassional dry cough. Breathing is unlabored. Heart: RRR with S1 S2, tachy. No murmurs, rubs, or gallops appreciated. Abdomen: SNo obvious abdominal masses.oft, mild tenderness, non-distended with normoactive bowel sounds. No rebound/guarding.  M-S:  Strength and tone appear normal for age. Lower extremities:without edema or ischemic changes, no open wounds  Neuro: Alert and oriented X 3. Moves all extremities spontaneously. Psych:  Responds to questions appropriately with a normal affect. Dialysis Access: L AVF +b/t  CXR no acute disease  Dialysis Orders:  TTS @ [facility] 4hrs    66.5kgs 3K/2.25Ca+ 400/1.5  3000 Heparin  L AVF No hectorol  Aranesp 25mg  q 2 weeks  No Venofer    Assessment/Plan: 1. Headache- per primary. head CT- no acute abnormality. Resolved 2. Dyspnea- lungs hypoaerated. Refuses CPAP. Levaquin. solumedrol. ECHO pending.  CXR no edema 3. ESRD -  TTS @ [facility]. HD pending today. K+ 4.5 4. Hypertension/volume  - 163/91 multiple home meds- amlodipine, clonidine, hydralazine, isosorbide. EDW recently increased, will need to decrease. ? compliance with meds  3 kg up, clear cxr 5. Anemia  - hgb 10.9 Aranesp q 2 weeks, due today. No Fe, last tsat 64 6. Metabolic bone disease -  Ca+ 8.5. Last phos 4.6, last PTH (7/23) 87. No calcitriol. Cont phoslo 7. Nutrition - Alb 3.7. renal diet. Multivit.  8. Chronic back pain 9. Smoking- nicotine patch. Encourage cessation 10. COPD on daliresp, spiriva, albuterol 11. CM EF 20-25%  Jetty Duhamel, NP [employer] [phone] 01/15/2014, 10:26 AM   Pt seen, examined, agree w assess/plan as above with additions as indicated.  Vinson Moselle MD pager [phone]    cell  [phone] 01/15/2014, 2:29 PM

## 2014-01-15 NOTE — Progress Notes (Signed)
Transported to hemodialysis via bed and hemo staff.

## 2014-01-15 NOTE — Progress Notes (Signed)
INITIAL NUTRITION ASSESSMENT  DOCUMENTATION CODES Per approved criteria  -Not Applicable   INTERVENTION: Nepro Shake po daily, each supplement provides 425 kcal and 19 grams protein RD to follow for nutrition care plan  NUTRITION DIAGNOSIS: Increased nutrient needs related to ESRD on HD as evidenced by estimated nutrition needs  Goal: Pt to meet >/= 90% of their estimated nutrition needs   Monitor:  PO & supplemental intake, weight, labs, I/O's  Reason for Assessment: Malnutrition Screening Tool Report  53 y.o. male  Admitting Dx: shortness of breath  ASSESSMENT: 53 y.o. male who presented to [facility] hospital yesterday with complaints of a headache gradually worsening over the past week. He receives HD TTS @ [facility]. He also reports SOB which is a chronic issue and he wears O2 Q HD treatment, he does not use his CPAP.   Patient reports his appetite has been variable recently; usually consumes 3 meals per day; weight has been stable; reports his breakfast was good this AM (consumed 90%); drinks Special K shakes at home; amenable to trying Nepro supplement during hospitalization; RD to order.  Dietary recall:   Breakfast: donut with Special K shake Lunch: sandwich or protein bar Dinner: meat, starch, veggie  No muscle or subcutaneous fat depletion noticed.  Height: Ht Readings from Last 1 Encounters:  01/14/14  (1.702 m)    Weight: Wt Readings from Last 1 Encounters:  01/14/14 153 lb 3.5 oz (69.5 kg)    Ideal Body Weight: 148 lb  % Ideal Body Weight: 103%  Wt Readings from Last 10 Encounters:  01/14/14 153 lb 3.5 oz (69.5 kg)  02/15/13 162 lb (73.483 kg)  10/10/12 162 lb (73.483 kg)  10/10/12 162 lb (73.483 kg)  10/05/12 163 lb 12.8 oz (74.299 kg)  08/29/12 168 lb (76.204 kg)  02/02/12 161 lb (73.029 kg)  01/06/12 165 lb 12.6 oz (75.2 kg)  01/06/12 165 lb 12.6 oz (75.2 kg)  12/29/11 161 lb 1.9 oz (73.084 kg)    Usual Body Weight: 162 lb  % Usual  Body Weight: 94%  BMI:  Body mass index is 23.99 kg/(m^2).  Estimated Nutritional Needs: Kcal: 2000-2200 Protein: 100-110 gm Fluid: 1200 ml   Skin: Intact  Diet Order: Renal w/1200 ml fluid restriction   EDUCATION NEEDS: -No education needs identified at this time   Intake/Output Summary (Last 24 hours) at 01/15/14 1342 Last data filed at 01/15/14 1308  Gross per 24 hour  Intake    800 ml  Output    200 ml  Net    600 ml    Labs:   Recent Labs Lab 01/14/14 1724 01/15/14 0350  NA 130* 129*  K 4.7 4.5  CL 86* 87*  CO2 21 17*  BUN 41* 54*  CREATININE 6.48* 7.04*  CALCIUM 8.7 8.5  GLUCOSE 143* 164*    Scheduled Meds: . amLODipine  10 mg Oral Daily  . calcium acetate  667 mg Oral TID WC  . cloNIDine  0.2 mg Oral BID  . darbepoetin (ARANESP) injection - DIALYSIS  25 mcg Intravenous Once  . diltiazem  120 mg Oral QHS  . enoxaparin (LOVENOX) injection  30 mg Subcutaneous Q24H  . gabapentin  100 mg Oral TID  . hydrALAZINE  100 mg Oral TID  . ipratropium-albuterol  3 mL Nebulization Q6H  . isosorbide mononitrate  30 mg Oral BID  . levofloxacin (LEVAQUIN) IV  500 mg Intravenous Q48H  . methylPREDNISolone (SOLU-MEDROL) injection  60 mg Intravenous Q12H  .  multivitamin  1 tablet Oral QHS  . nebivolol  20 mg Oral Daily  . nicotine  21 mg Transdermal Q24H  . pantoprazole  40 mg Oral Daily  . roflumilast  500 mcg Oral Daily  . Umeclidinium-Vilanterol  1 puff Inhalation Daily    Continuous Infusions:   Past Medical History  Diagnosis Date  . Hypertension   . Systolic heart failure     EF 21-30%  . Hyperlipidemia   . COPD (chronic obstructive pulmonary disease)   . Peripheral vascular disease   . Pneumonia 1980's  . OSA on CPAP   . ETOH abuse   . History of stomach ulcers   . Arthritis     "arms"  . Chronic lower back pain   . Anxiety   . Depression   . Adult ADHD     "never diagnosed; my son was; I think I've got it too"  . Chronic kidney disease  (CKD), stage III (moderate)     baseline cr 2.3  . Renal cyst     left, complex  . Renal artery stenosis     a. s/p BMS to inferior branch of right renal artery 12/2011. On left, 3 renal arteries were noted, 2 of them were very small in size and subtotally occluded  . Anginal pain   . CHF (congestive heart failure)   . Seasonal allergies   . Hepatitis     "I think it was C"    Past Surgical History  Procedure Laterality Date  . Renal artery stent  01/05/2012    right inferior  . No past surgeries    . Av fistula placement Left 10/10/2012    Procedure: ARTERIOVENOUS (AV) FISTULA CREATION;  Surgeon: Sherren Kerns, MD;  Location: [facility] OR;  Service: Vascular;  Laterality: Left;  Brachio-cephalic    Maureen Chatters, RD, LDN Pager #: [phone] After-Hours Pager #: [phone]

## 2014-01-15 NOTE — Procedures (Signed)
I was present at this dialysis session, have reviewed the session itself and made  appropriate changes  Vinson Moselle MD (pgr) [phone]    [phone] 01/15/2014, 2:47 PM

## 2014-01-15 NOTE — Progress Notes (Signed)
  Echocardiogram 2D Echocardiogram has been performed.  Arvil Chaco 01/15/2014, 2:15 PM

## 2014-01-15 NOTE — Progress Notes (Signed)
[facility]  [name] MWU:[id number] DOB: February 22, 1961 DOA: 01/14/2014 PCP: Wilmer Floor., MD   Brief narrative: 53 year old male patient with known sleep apnea noncompliant with CPAP, diastolic heart failure, end-stage renal disease on dialysis. He presented to our hospital from [facility] due to reports of progressive shortness of breath over 2-3 weeks. He also apparently was complaining of a headache. He reported to the admitting physician that he had been inconsistent in taking his medications at the past few weeks. He continues to smoke.  After presenting to the ER he was noted to be wheezing and having difficulty breathing all of which improved after a dose of Solu-Medrol and nebulizer treatment. His chest x-ray was consistent with COPD without evidence of heart failure or pulmonary infiltrates.  Since admission patient has persisted in complaints of headache. His blood pressure although elevated as not markedly elevated. CT of the head was negative for acute bleed or other acute process. He has had no further respiratory symptoms as well.  HPI/Subjective: Alert and primarily complaining of headache and wanted to know when his next dose of morphine would be.  Assessment/Plan: Active Problems:   Uncontrolled hypertension -Related to noncompliance with antihypertensive medications-unclear has been compliant with dialysis treatments and/or duration of treatment -Continue  Bystolic to 20 mg daily  -Continue amlodipine 10 mg daily -Continue clonidine 0.2 mg BID -Continue Cardizem 120 mg daily -Continue hydralazine 100 mg TID -Continue Imdur 30 mg BID -Would look at simplifying patient's BP medication regimen in order to increase compliance; i.e. look at increasing some medication and discontinuing others.  Tachycardia -Resume home calcium channel blocker -continue Bystolic to 20 mg daily     CKD (chronic kidney disease) stage V  requiring chronic dialysis -Nephrology consulted-usually dialyzes T/Th/Sat at [place]    Acute respiratory failure with hypoxia due to COPD exacerbation -Resolved-tapered and DC steroids (complete 3 day course) -continue empiric antibiotics to treat possible acute bronchitis in setting of COPD (complete seven-day course)    OSA (obstructive sleep apnea) -Noncompliant with CPAP at home as well as here    Headache -CT head negative-suspect etiology related to uncontrolled blood pressure    Peripheral vascular disease   DVT prophylaxis: Lovenox Code Status: Full Family Communication: No family at bedside Disposition Plan/Expected LOS: Transfer to renal floor   Consultants: Nephrology  Procedures: 2-D echocardiogram pending  Cultures: None  Antibiotics: Levaquin 9/7 >>>  Objective: Blood pressure 162/94, pulse 114, temperature 98.1 F (36.7 C), temperature source Oral, resp. rate 21, height  (1.702 m), weight 153 lb 3.5 oz (69.5 kg), SpO2 98.00%.  Intake/Output Summary (Last 24 hours) at 01/15/14 1341 Last data filed at 01/15/14 1308  Gross per 24 hour  Intake    800 ml  Output    200 ml  Net    600 ml     Exam: Gen: No acute respiratory distress Chest: Clear to auscultation bilaterally without wheezes, rhonchi or crackles but diminished throughout, 2 L Cardiac: Regular tachycardic rate and rhythm, S1-S2, no rubs murmurs or gallops, no peripheral edema, no JVD Abdomen: Soft nontender nondistended without obvious hepatosplenomegaly, no ascites Extremities: Symmetrical in appearance without cyanosis, clubbing or effusion  Scheduled Meds:  Scheduled Meds: . amLODipine  10 mg Oral Daily  . calcium acetate  667 mg Oral TID WC  . cloNIDine  0.2 mg Oral BID  . darbepoetin (ARANESP) injection - DIALYSIS  25 mcg Intravenous Once  .  diltiazem  120 mg Oral QHS  . enoxaparin (LOVENOX) injection  30 mg Subcutaneous Q24H  . gabapentin  100 mg Oral TID  . hydrALAZINE   100 mg Oral TID  . ipratropium-albuterol  3 mL Nebulization Q6H  . isosorbide mononitrate  30 mg Oral BID  . levofloxacin (LEVAQUIN) IV  500 mg Intravenous Q48H  . methylPREDNISolone (SOLU-MEDROL) injection  60 mg Intravenous Q12H  . multivitamin  1 tablet Oral QHS  . nebivolol  20 mg Oral Daily  . nicotine  21 mg Transdermal Q24H  . pantoprazole  40 mg Oral Daily  . roflumilast  500 mcg Oral Daily  . Umeclidinium-Vilanterol  1 puff Inhalation Daily   Continuous Infusions:   Data Reviewed: Basic Metabolic Panel:  Recent Labs Lab 01/14/14 1724 01/15/14 0350  NA 130* 129*  K 4.7 4.5  CL 86* 87*  CO2 21 17*  GLUCOSE 143* 164*  BUN 41* 54*  CREATININE 6.48* 7.04*  CALCIUM 8.7 8.5   Liver Function Tests:  Recent Labs Lab 01/14/14 1724  AST 48*  ALT 26  ALKPHOS 83  BILITOT 0.3  PROT 7.1  ALBUMIN 3.7   No results found for this basename: LIPASE, AMYLASE,  in the last 168 hours No results found for this basename: AMMONIA,  in the last 168 hours CBC:  Recent Labs Lab 01/14/14 1724 01/15/14 0350  WBC 6.4 9.9  HGB 11.2* 10.9*  HCT 32.4* 31.7*  MCV 98.2 98.1  PLT 207 189   Cardiac Enzymes:  Recent Labs Lab 01/14/14 2010  TROPONINI <0.30   BNP (last 3 results) No results found for this basename: PROBNP,  in the last 8760 hours CBG: No results found for this basename: GLUCAP,  in the last 168 hours  Recent Results (from the past 240 hour(s))  MRSA PCR SCREENING     Status: None   Collection Time    01/14/14  4:44 PM      Result Value Ref Range Status   MRSA by PCR NEGATIVE  NEGATIVE Final   Comment:            The GeneXpert MRSA Assay (FDA     approved for NASAL specimens     only), is one component of a     comprehensive MRSA colonization     surveillance program. It is not     intended to diagnose MRSA     infection nor to guide or     monitor treatment for     MRSA infections.     Studies:  Recent x-ray studies have been reviewed in detail  by the Attending Physician  Time spent :      Junious Silk, ANP [facility] Office  [phone] Pager [phone]   **If unable to reach the above provider after paging please contact the Flow Manager @ [phone]  On-Call/Text Page:      [email or web address]      password [email or web address]  If 7PM-7AM, please contact night-coverage [email or web address] Password [email or web address] 01/15/2014, 1:41 PM   LOS: 1 day   Examined patient and discussed assessment and plan with ANP Revonda Standard and agree with above.  Patient with multiple complex medical problems> 40 minutes spent in direct patient care

## 2014-01-15 NOTE — Progress Notes (Signed)
Utilization Review Completed.[name] T9/12/2013  

## 2014-01-16 LAB — BASIC METABOLIC PANEL
ANION GAP: 18 — AB (ref 5–15)
BUN: 27 mg/dL — ABNORMAL HIGH (ref 6–23)
CHLORIDE: 95 meq/L — AB (ref 96–112)
CO2: 24 meq/L (ref 19–32)
CREATININE: 3.93 mg/dL — AB (ref 0.50–1.35)
Calcium: 8.9 mg/dL (ref 8.4–10.5)
GFR calc Af Amer: 19 mL/min — ABNORMAL LOW (ref >90)
GFR calc non Af Amer: 16 mL/min — ABNORMAL LOW (ref >90)
Glucose, Bld: 104 mg/dL — ABNORMAL HIGH (ref 70–99)
Potassium: 3.5 mEq/L — ABNORMAL LOW (ref 3.7–5.3)
Sodium: 137 mEq/L (ref 137–147)

## 2014-01-16 LAB — CBC
HEMATOCRIT: 32 % — AB (ref 39.0–52.0)
HEMOGLOBIN: 10.8 g/dL — AB (ref 13.0–17.0)
MCH: 34.4 pg — ABNORMAL HIGH (ref 26.0–34.0)
MCHC: 33.8 g/dL (ref 30.0–36.0)
MCV: 101.9 fL — ABNORMAL HIGH (ref 78.0–100.0)
Platelets: 194 10*3/uL (ref 150–400)
RBC: 3.14 MIL/uL — ABNORMAL LOW (ref 4.22–5.81)
RDW: 15.4 % (ref 11.5–15.5)
WBC: 11.3 10*3/uL — ABNORMAL HIGH (ref 4.0–10.5)

## 2014-01-16 MED ORDER — MORPHINE SULFATE 2 MG/ML IJ SOLN
1.0000 mg | INTRAMUSCULAR | Status: DC | PRN
  Administered 2014-01-16 – 2014-01-17 (×2): 1 mg via INTRAVENOUS
  Filled 2014-01-16: qty 1

## 2014-01-16 MED ORDER — DARBEPOETIN ALFA-POLYSORBATE 25 MCG/0.42ML IJ SOLN
25.0000 ug | INTRAMUSCULAR | Status: AC
  Administered 2014-01-17: 25 ug via INTRAVENOUS
  Filled 2014-01-16: qty 0.42

## 2014-01-16 MED ORDER — NEPRO/CARBSTEADY PO LIQD
237.0000 mL | Freq: Every day | ORAL | Status: DC
  Administered 2014-01-16 – 2014-01-17 (×2): 237 mL via ORAL

## 2014-01-16 MED ORDER — METHYLPREDNISOLONE SODIUM SUCC 125 MG IJ SOLR
60.0000 mg | Freq: Two times a day (BID) | INTRAMUSCULAR | Status: AC
  Administered 2014-01-16: 60 mg via INTRAVENOUS
  Filled 2014-01-16: qty 0.96

## 2014-01-16 MED ORDER — DOCUSATE SODIUM 100 MG PO CAPS
200.0000 mg | ORAL_CAPSULE | Freq: Two times a day (BID) | ORAL | Status: DC
  Administered 2014-01-16 – 2014-01-17 (×2): 200 mg via ORAL
  Filled 2014-01-16 (×3): qty 2

## 2014-01-16 MED ORDER — LEVOFLOXACIN 500 MG PO TABS
500.0000 mg | ORAL_TABLET | ORAL | Status: DC
  Administered 2014-01-16: 500 mg via ORAL
  Filled 2014-01-16: qty 1

## 2014-01-16 MED ORDER — PREDNISONE 50 MG PO TABS
50.0000 mg | ORAL_TABLET | Freq: Every day | ORAL | Status: DC
  Administered 2014-01-17: 50 mg via ORAL
  Filled 2014-01-16 (×2): qty 1

## 2014-01-16 MED ORDER — GABAPENTIN 100 MG PO CAPS
100.0000 mg | ORAL_CAPSULE | Freq: Every day | ORAL | Status: DC
  Filled 2014-01-16: qty 1

## 2014-01-16 MED ORDER — ZOLPIDEM TARTRATE 5 MG PO TABS
5.0000 mg | ORAL_TABLET | Freq: Every evening | ORAL | Status: DC | PRN
  Administered 2014-01-16: 5 mg via ORAL
  Filled 2014-01-16: qty 1

## 2014-01-16 NOTE — Progress Notes (Signed)
Patient ambulated from his room to nursing desk without oxygen. Tolerated well. No dizziness,gait steady. Oxygen sat. monitored, ranged from 97-100%.

## 2014-01-16 NOTE — Progress Notes (Signed)
Subjective:   Feeling much better, slight headache but improved.  Objective Filed Vitals:   01/16/14 0301 01/16/14 0551 01/16/14 0854 01/16/14 0900  BP:  123/73  127/80  Pulse: 96 94  104  Temp:  97.9 F (36.6 C)    TempSrc:  Oral    Resp: 16 18    Height:      Weight:      SpO2: 98% 100% 100% 97%   Physical Exam General: alert and oriented, no acute distress Heart: RRR, no murmur Lungs: CTA, unlabored  Abdomen: soft, nontender +BS Extremities: no edema Dialysis Access:  L AVF +b/t  Dialysis Orders: TTS @ [place]  4hrs 66.5kgs 3K/2.25Ca+ 400/1.5 3000 Heparin L AVF  No hectorol Aranesp  q 2 weeks No Venofer   Assessment/Plan:  1. Headache- per primary. head CT- no acute abnormality.  2. Dyspnea- .chest xray no edema Refuses CPAP. Levaquin. solumedrol. ECHO- EF 65-70%. Needs to walk and wean 02 3. ESRD - TTS @ [place]. HD tomorrow. K+3.5 4. Hypertension/volume - 127/80 multiple home meds- amlodipine, clonidine, hydralazine, isosorbide. EDW recently increased, will need to decrease. ? compliance with meds 5. Anemia - hgb 10.8 Aranesp q 2 weeks,given 9/8. No Fe, last tsat 64 6. Metabolic bone disease - Ca+ 8.9. Last phos 4.6, last PTH (7/23) 87. No calcitriol. Cont phoslo 7. Nutrition - Alb 3.7. renal diet. Multivit.  8. Chronic back pain 9. Smoking- nicotine patch. Encourage cessation 10. COPD on daliresp, spiriva, albuterol   Jetty Duhamel, NP [facility] Beeper [phone] 01/16/2014,9:58 AM  LOS: 2 days    Additional Objective Labs: Basic Metabolic Panel:  Recent Labs Lab 01/14/14 1724 01/15/14 0350 01/16/14 0620  NA 130* 129* 137  K 4.7 4.5 3.5*  CL 86* 87* 95*  CO2 21 17* 24  GLUCOSE 143* 164* 104*  BUN 41* 54* 27*  CREATININE 6.48* 7.04* 3.93*  CALCIUM 8.7 8.5 8.9   Liver Function Tests:  Recent Labs Lab 01/14/14 1724  AST 48*  ALT 26  ALKPHOS 83  BILITOT 0.3  PROT 7.1  ALBUMIN 3.7   No results found for this basename:  LIPASE, AMYLASE,  in the last 168 hours CBC:  Recent Labs Lab 01/14/14 1724 01/15/14 0350 01/16/14 0620  WBC 6.4 9.9 11.3*  HGB 11.2* 10.9* 10.8*  HCT 32.4* 31.7* 32.0*  MCV 98.2 98.1 101.9*  PLT 207 189 194   Blood Culture No results found for this basename: sdes, specrequest, cult, reptstatus    Cardiac Enzymes:  Recent Labs Lab 01/14/14 2010  TROPONINI <0.30   CBG: No results found for this basename: GLUCAP,  in the last 168 hours Iron Studies: No results found for this basename: IRON, TIBC, TRANSFERRIN, FERRITIN,  in the last 72 hours @ Studies/Results: Dg Chest 2 View  01/14/2014   CLINICAL DATA:  Chest pain, shortness of breath  EXAM: CHEST  2 VIEW  COMPARISON:  01/14/2014 at 10:48 a.m. at [facility], available for comparison on [facility]  FINDINGS: The heart size is at upper limits of normal. Both lungs are hypoaerated with minimal bibasilar atelectasis reidentified. The visualized skeletal structures are unremarkable.  IMPRESSION: No significant change since prior exam obtained earlier today at [facility] at 10:48 a.m., available for comparison on [facility].   Electronically Signed   By: Christiana Pellant M.D.   On: 01/14/2014 18:04   Ct Head Wo Contrast  01/15/2014   CLINICAL DATA:  Severe right occipital headache for 1 week with dizziness.  EXAM: CT HEAD WITHOUT CONTRAST  TECHNIQUE: Contiguous axial images were obtained from the base of the skull through the vertex without intravenous contrast.  COMPARISON:  Prior CT from 01/14/2014  FINDINGS: Diffuse prominence of the CSF containing spaces is compatible with generalized cerebral atrophy. Scattered and confluent hypodensity within the periventricular and deep white matter both cerebral hemispheres is most consistent with mild chronic small vessel ischemic disease.  No acute intracranial hemorrhage or infarct. No mass lesion or midline shift. No hydrocephalus. No extra-axial fluid collection.   Calvarium is intact. No acute abnormality seen within the scalp soft tissues. Orbital soft tissues within normal limits.  IMPRESSION: 1. No acute intracranial abnormality. No findings to explain headaches. 2. Atrophy with chronic microvascular ischemic disease.   Electronically Signed   By: Rise Mu M.D.   On: 01/15/2014 06:33   Medications:   . calcium acetate  667 mg Oral TID WC  . cloNIDine  0.2 mg Oral BID  . darbepoetin (ARANESP) injection - DIALYSIS  25 mcg Intravenous Once  . diltiazem  120 mg Oral QHS  . enoxaparin (LOVENOX) injection  30 mg Subcutaneous Q24H  . feeding supplement (NEPRO CARB STEADY)  237 mL Oral Q1500  . [START ON 01/17/2014] gabapentin  100 mg Oral QHS  . hydrALAZINE  100 mg Oral TID  . ipratropium-albuterol  3 mL Nebulization Q6H  . isosorbide mononitrate  30 mg Oral BID  . levofloxacin  500 mg Oral Q48H  . methylPREDNISolone (SOLU-MEDROL) injection  60 mg Intravenous Q12H  . multivitamin  1 tablet Oral QHS  . nebivolol  20 mg Oral Daily  . nicotine  21 mg Transdermal Q24H  . pantoprazole  40 mg Oral Daily  . [START ON 01/17/2014] predniSONE  50 mg Oral Q breakfast  . roflumilast  500 mcg Oral Daily  . Umeclidinium-Vilanterol  1 puff Inhalation Daily

## 2014-01-16 NOTE — Progress Notes (Signed)
I saw the patient and agree with the above assessment and plan.     Doing well, HAs improving.  Uneventful HD yesterday on schedule.  BP improved this AM.  HD tomorrow if here.   Sabra Heck, MD

## 2014-01-16 NOTE — Progress Notes (Signed)
Progress Note  [name]:[id number] DOB: [birth year] DOA: 01/14/2014 PCP: Wilmer Floor., MD   Brief narrative: 53 year old male patient with known sleep apnea noncompliant with CPAP, diastolic heart failure, end-stage renal disease on dialysis. He presented to our hospital from [facility] due to reports of progressive shortness of breath over 2-3 weeks. He also apparently was complaining of a headache. He reported to the admitting physician that he had been inconsistent in taking his medications at the past few weeks.  After presenting to the ER he was noted to be wheezing and having difficulty breathing all of which improved after a dose of Solu-Medrol and nebulizer treatment. His chest x-ray was consistent with COPD without evidence of heart failure or pulmonary infiltrates. Since admission patient has persisted in complaints of headache. His blood pressure although elevated as not markedly elevated. CT of the head was negative for acute bleed or other acute process. He has had no further respiratory symptoms as well.  HPI/Subjective: Breathing improving  Assessment/Plan: Active Problems:    Acute respiratory failure with hypoxia due to COPD exacerbation and volume overload -cut down IV solumedrol, change to Prednisone -continue empiric antibiotics to treat possible acute bronchitis in setting of COPD (complete seven-day course)    Uncontrolled hypertension -Related to noncompliance with antihypertensive medications-unclear has been compliant with dialysis treatments and/or duration of treatment -Continue  Bystolic to 20 mg daily, clonidine 0.2 mg BID, Cardizem 120 mg daily, hydralazine 100 mg TID, Imdur 30 mg BID -improved on current regimen  Tachycardia -improved, continue Cardizem and Bystolic to 20 mg daily     CKD (chronic kidney disease) stage V requiring chronic dialysis -Nephrology following -HD 9/8    OSA (obstructive sleep apnea) -Noncompliant with  CPAP at home as well as here    Headache -CT head negative-suspect etiology related to uncontrolled blood pressure    Peripheral vascular disease  DVT prophylaxis: Lovenox  Code Status: Full Family Communication: No family at bedside Disposition Plan: home tomorrow if improved   Consultants: Nephrology  Procedures: 2-D echocardiogram   Cultures: None  Antibiotics: Levaquin 9/7 >>>  Objective: Blood pressure 127/80, pulse 104, temperature 97.9 F (36.6 C), temperature source Oral, resp. rate 18, height  (1.702 m), weight 66.316 kg (146 lb 3.2 oz), SpO2 100.00%.  Intake/Output Summary (Last 24 hours) at 01/16/14 1435 Last data filed at 01/16/14 1323  Gross per 24 hour  Intake    240 ml  Output   4000 ml  Net  -3760 ml   Exam: Gen: No acute respiratory distress Chest: Clear to auscultation bilaterally without wheezes, rhonchi or crackles but diminished throughout, 2 L Cardiac: Regular tachycardic rate and rhythm, S1-S2, no rubs murmurs or gallops, no peripheral edema, no JVD Abdomen: Soft nontender nondistended without obvious hepatosplenomegaly, no ascites Extremities: Symmetrical in appearance without cyanosis, clubbing or effusion  Scheduled Meds:  Scheduled Meds: . calcium acetate  667 mg Oral TID WC  . cloNIDine  0.2 mg Oral BID  . [START ON 01/17/2014] darbepoetin (ARANESP) injection - DIALYSIS  25 mcg Intravenous Q Thu-HD  . diltiazem  120 mg Oral QHS  . enoxaparin (LOVENOX) injection  30 mg Subcutaneous Q24H  . feeding supplement (NEPRO CARB STEADY)  237 mL Oral Daily  . [START ON 01/17/2014] gabapentin  100 mg Oral QHS  . hydrALAZINE  100 mg Oral TID  . ipratropium-albuterol  3 mL Nebulization Q6H  . isosorbide mononitrate  30 mg Oral BID  . levofloxacin  500 mg Oral Q48H  . methylPREDNISolone (SOLU-MEDROL) injection  60 mg Intravenous Q12H  . multivitamin  1 tablet Oral QHS  . nebivolol  20 mg Oral Daily  . nicotine  21 mg Transdermal Q24H  .  pantoprazole  40 mg Oral Daily  . [START ON 01/17/2014] predniSONE  50 mg Oral Q breakfast  . roflumilast  500 mcg Oral Daily  . Umeclidinium-Vilanterol  1 puff Inhalation Daily   Continuous Infusions:   Data Reviewed: Basic Metabolic Panel:  Recent Labs Lab 01/14/14 1724 01/15/14 0350 01/16/14 0620  NA 130* 129* 137  K 4.7 4.5 3.5*  CL 86* 87* 95*  CO2 21 17* 24  GLUCOSE 143* 164* 104*  BUN 41* 54* 27*  CREATININE 6.48* 7.04* 3.93*  CALCIUM 8.7 8.5 8.9   Liver Function Tests:  Recent Labs Lab 01/14/14 1724  AST 48*  ALT 26  ALKPHOS 83  BILITOT 0.3  PROT 7.1  ALBUMIN 3.7   No results found for this basename: LIPASE, AMYLASE,  in the last 168 hours No results found for this basename: AMMONIA,  in the last 168 hours CBC:  Recent Labs Lab 01/14/14 1724 01/15/14 0350 01/16/14 0620  WBC 6.4 9.9 11.3*  HGB 11.2* 10.9* 10.8*  HCT 32.4* 31.7* 32.0*  MCV 98.2 98.1 101.9*  PLT 207 189 194   Cardiac Enzymes:  Recent Labs Lab 01/14/14 2010  TROPONINI <0.30   BNP (last 3 results) No results found for this basename: PROBNP,  in the last 8760 hours CBG: No results found for this basename: GLUCAP,  in the last 168 hours  Recent Results (from the past 240 hour(s))  MRSA PCR SCREENING     Status: None   Collection Time    01/14/14  4:44 PM      Result Value Ref Range Status   MRSA by PCR NEGATIVE  NEGATIVE Final   Comment:            The GeneXpert MRSA Assay (FDA     approved for NASAL specimens     only), is one component of a     comprehensive MRSA colonization     surveillance program. It is not     intended to diagnose MRSA     infection nor to guide or     monitor treatment for     MRSA infections.     Studies:  If 7PM-7AM, please contact night-coverage [email or web address] Password [email or web address]  01/16/2014, 2:35 PM   LOS: 2 days   Examined patient and discussed assessment and plan with ANP Revonda Standard and agree with above.  Patient with multiple complex  medical problems> 40 minutes spent in direct patient care

## 2014-01-16 NOTE — Progress Notes (Signed)
Pt refuses CPAP at this time. Pt knows he is supposed to wear it. RT available it pt changes his mind.

## 2014-01-17 DIAGNOSIS — N186 End stage renal disease: Secondary | ICD-10-CM

## 2014-01-17 DIAGNOSIS — Z992 Dependence on renal dialysis: Secondary | ICD-10-CM

## 2014-01-17 LAB — CBC
HCT: 30.3 % — ABNORMAL LOW (ref 39.0–52.0)
Hemoglobin: 10.4 g/dL — ABNORMAL LOW (ref 13.0–17.0)
MCH: 33.5 pg (ref 26.0–34.0)
MCHC: 34.3 g/dL (ref 30.0–36.0)
MCV: 97.7 fL (ref 78.0–100.0)
PLATELETS: 211 10*3/uL (ref 150–400)
RBC: 3.1 MIL/uL — ABNORMAL LOW (ref 4.22–5.81)
RDW: 14.9 % (ref 11.5–15.5)
WBC: 13.3 10*3/uL — ABNORMAL HIGH (ref 4.0–10.5)

## 2014-01-17 LAB — RENAL FUNCTION PANEL
Albumin: 3.5 g/dL (ref 3.5–5.2)
Anion gap: 25 — ABNORMAL HIGH (ref 5–15)
BUN: 72 mg/dL — ABNORMAL HIGH (ref 6–23)
CALCIUM: 8.9 mg/dL (ref 8.4–10.5)
CO2: 19 mEq/L (ref 19–32)
CREATININE: 6.16 mg/dL — AB (ref 0.50–1.35)
Chloride: 87 mEq/L — ABNORMAL LOW (ref 96–112)
GFR calc Af Amer: 11 mL/min — ABNORMAL LOW (ref >90)
GFR, EST NON AFRICAN AMERICAN: 9 mL/min — AB (ref >90)
Glucose, Bld: 116 mg/dL — ABNORMAL HIGH (ref 70–99)
Phosphorus: 4.6 mg/dL (ref 2.3–4.6)
Potassium: 4.6 mEq/L (ref 3.7–5.3)
Sodium: 131 mEq/L — ABNORMAL LOW (ref 137–147)

## 2014-01-17 MED ORDER — ALPRAZOLAM 0.5 MG PO TABS
ORAL_TABLET | ORAL | Status: AC
  Filled 2014-01-17: qty 1

## 2014-01-17 MED ORDER — PREDNISONE 20 MG PO TABS: ORAL_TABLET | ORAL | Status: DC

## 2014-01-17 MED ORDER — LIDOCAINE-PRILOCAINE 2.5-2.5 % EX CREA: 1.0000 "application " | TOPICAL_CREAM | CUTANEOUS | Status: DC | PRN

## 2014-01-17 MED ORDER — DARBEPOETIN ALFA-POLYSORBATE 25 MCG/0.42ML IJ SOLN
INTRAMUSCULAR | Status: AC
  Filled 2014-01-17: qty 0.42

## 2014-01-17 MED ORDER — HEPARIN SODIUM (PORCINE) 1000 UNIT/ML DIALYSIS: 3000.0000 [IU] | Freq: Once | INTRAMUSCULAR | Status: DC

## 2014-01-17 MED ORDER — LIDOCAINE HCL (PF) 1 % IJ SOLN: 5.0000 mL | INTRAMUSCULAR | Status: DC | PRN

## 2014-01-17 MED ORDER — CALCIUM CARBONATE ANTACID 500 MG PO CHEW
CHEWABLE_TABLET | ORAL | Status: AC
  Filled 2014-01-17: qty 2

## 2014-01-17 MED ORDER — HEPARIN SODIUM (PORCINE) 1000 UNIT/ML DIALYSIS: 1000.0000 [IU] | INTRAMUSCULAR | Status: DC | PRN

## 2014-01-17 MED ORDER — ALPRAZOLAM 0.5 MG PO TABS: 0.5000 mg | ORAL_TABLET | Freq: Three times a day (TID) | ORAL | Status: DC | PRN

## 2014-01-17 MED ORDER — ALTEPLASE 2 MG IJ SOLR
2.0000 mg | Freq: Once | INTRAMUSCULAR | Status: DC | PRN
  Filled 2014-01-17: qty 2

## 2014-01-17 MED ORDER — ALBUTEROL SULFATE HFA 108 (90 BASE) MCG/ACT IN AERS: 2.0000 | INHALATION_SPRAY | Freq: Four times a day (QID) | RESPIRATORY_TRACT | Status: DC | PRN

## 2014-01-17 MED ORDER — MORPHINE SULFATE 2 MG/ML IJ SOLN
INTRAMUSCULAR | Status: AC
  Filled 2014-01-17: qty 1

## 2014-01-17 MED ORDER — POLYETHYLENE GLYCOL 3350 17 G PO PACK
17.0000 g | PACK | Freq: Every day | ORAL | Status: DC | PRN
  Filled 2014-01-17: qty 1

## 2014-01-17 MED ORDER — SODIUM CHLORIDE 0.9 % IV SOLN: 100.0000 mL | INTRAVENOUS | Status: DC | PRN

## 2014-01-17 MED ORDER — NEPRO/CARBSTEADY PO LIQD: 237.0000 mL | ORAL | Status: DC | PRN

## 2014-01-17 MED ORDER — PENTAFLUOROPROP-TETRAFLUOROETH EX AERO
1.0000 | INHALATION_SPRAY | CUTANEOUS | Status: DC | PRN
Start: 2014-01-17 — End: 2014-01-17

## 2014-01-17 MED ORDER — BUDESONIDE-FORMOTEROL FUMARATE 80-4.5 MCG/ACT IN AERO: 2.0000 | INHALATION_SPRAY | Freq: Two times a day (BID) | RESPIRATORY_TRACT | Status: AC

## 2014-01-17 NOTE — Progress Notes (Signed)
Patient refused CPAP- not in room.

## 2014-01-17 NOTE — Progress Notes (Signed)
Subjective:   No BM in 3 days.   Objective Filed Vitals:   01/16/14 2157 01/17/14 0225 01/17/14 0520 01/17/14 0943  BP: 143/82  116/76 142/77  Pulse: 104  97 96  Temp: 97.9 F (36.6 C)  97.6 F (36.4 C) 97.8 F (36.6 C)  TempSrc: Oral  Oral Oral  Resp: Height:      Weight:      SpO2: 95% 96% 99% 100%   Physical Exam General: alert and oriented. Mild headache Heart:  RRR, no murmur Lungs: CTA. Unlabored.  Abdomen: distended. nontender Extremities: no edema Dialysis Access: L AVF patent on HD  CXR 9/7 no edema, atx at base on L  Dialysis Orders: TTS @ [place]  4hrs 66.5kgs 3K/2.25Ca+ 400/1.5 3000 Heparin L AVF  No hectorol Aranesp  q 2 weeks No Venofer   Assessment/Plan:  1. Headache- per primary. head CT- no acute abnormality.  2. Dyspnea- .chest xray no edema Refuses CPAP. Levaquin. solumedrol. ECHO- EF 65-70%. Sats >95% RA His xrays were negative for edema, no evidence pulm edema on admission, prob more COPD related 3. ESRD - TTS @ [place]. HD today  4. Hypertension/volume - 141/85 multiple home meds- amlodipine, clonidine, hydralazine, isosorbide. EDW recently increased, will need to decrease. ? compliance with meds 5. Anemia - hgb 10.8 Aranesp q 2 weeks,given 9/8. No Fe, last tsat 64 6. Metabolic bone disease - Ca+ 8.9. Last phos 4.6, last PTH (7/23) 87. No calcitriol. Cont phoslo 7. Nutrition - Alb 3.7. renal diet. Multivit.  8. Chronic back pain 9. Smoking- nicotine patch. Encourage cessation 10. COPD on daliresp, spiriva, albuterol   Jetty Duhamel, NP [facility] Beeper [phone] 01/17/2014,12:23 PM  LOS: 3 days   Pt seen, examined, agree w assess/plan as above with additions as indicated. OK for dc from renal standpoint after HD today Vinson Moselle MD pager [phone]    cell [phone] 01/17/2014, 1:42 PM      Additional Objective Labs: Basic Metabolic Panel:  Recent Labs Lab 01/14/14 1724 01/15/14 0350 01/16/14 0620   NA 130* 129* 137  K 4.7 4.5 3.5*  CL 86* 87* 95*  CO2 21 17* 24  GLUCOSE 143* 164* 104*  BUN 41* 54* 27*  CREATININE 6.48* 7.04* 3.93*  CALCIUM 8.7 8.5 8.9   Liver Function Tests:  Recent Labs Lab 01/14/14 1724  AST 48*  ALT 26  ALKPHOS 83  BILITOT 0.3  PROT 7.1  ALBUMIN 3.7   No results found for this basename: LIPASE, AMYLASE,  in the last 168 hours CBC:  Recent Labs Lab 01/14/14 1724 01/15/14 0350 01/16/14 0620  WBC 6.4 9.9 11.3*  HGB 11.2* 10.9* 10.8*  HCT 32.4* 31.7* 32.0*  MCV 98.2 98.1 101.9*  PLT 207 189 194   Blood Culture No results found for this basename: sdes, specrequest, cult, reptstatus    Cardiac Enzymes:  Recent Labs Lab 01/14/14 2010  TROPONINI <0.30   CBG: No results found for this basename: GLUCAP,  in the last 168 hours Iron Studies: No results found for this basename: IRON, TIBC, TRANSFERRIN, FERRITIN,  in the last 72 hours @ Studies/Results: No results found. Medications:   . calcium acetate  667 mg Oral TID WC  . cloNIDine  0.2 mg Oral BID  . darbepoetin (ARANESP) injection - DIALYSIS  25 mcg Intravenous Q Thu-HD  . diltiazem  120 mg Oral QHS  . docusate sodium  200 mg Oral BID  . enoxaparin (LOVENOX) injection  30 mg Subcutaneous Q24H  . feeding supplement (NEPRO CARB STEADY)  237 mL Oral Daily  . gabapentin  100 mg Oral QHS  . heparin  3,000 Units Dialysis Once in dialysis  . hydrALAZINE  100 mg Oral TID  . ipratropium-albuterol  3 mL Nebulization Q6H  . isosorbide mononitrate  30 mg Oral BID  . levofloxacin  500 mg Oral Q48H  . morphine      . multivitamin  1 tablet Oral QHS  . nebivolol  20 mg Oral Daily  . nicotine  21 mg Transdermal Q24H  . pantoprazole  40 mg Oral Daily  . predniSONE  50 mg Oral Q breakfast  . roflumilast  500 mcg Oral Daily  . Umeclidinium-Vilanterol  1 puff Inhalation Daily

## 2014-01-17 NOTE — Discharge Summary (Signed)
Physician Discharge Summary  [name]:[id number] DOB: 11/10/1960 DOA: 01/14/2014  PCP: Albert Floor., MD  Admit date: 01/14/2014 Discharge date: 01/17/2014  Time spent: 45 minutes  Recommendations for Outpatient Follow-up:  1. PCP in 1 week 2. Smoking cessation  Discharge Diagnoses:     Acute respiratory failure with hypoxia   COPD exacerbation   Uncontrolled hypertension   CKD (chronic kidney disease) stage V requiring chronic dialysis   COPD (chronic obstructive pulmonary disease)   Peripheral vascular disease   Chronic systolic heart failure   OSA (obstructive sleep apnea)   chronic Headaches   Discharge Condition: stable  Diet recommendation: Renal  Filed Weights   01/15/14 1840 01/16/14 0026 01/17/14 1210  Weight: 67.2 kg (148 lb 2.4 oz) 66.316 kg (146 lb 3.2 oz) 69.7 kg (153 lb 10.6 oz)    History of present illness:  [name] is a 53 y.o. male with prior h/o OSA, diastolic heart failure, ESRD ON HD, TTS, last HD on Saturday, PAD, COPD ongoing tobacco use comes in from [facility] for worsening sob since 2 to3 weeks. Patient also reports missing his medications for the last few weeks. He continues to smoke. He reports headache and non compliance to CPAP machine. His breathing improved with one dose of solumedrol and duo neb treatment. His CXR shows hypoventilated lungs.   Hospital Course:  Acute respiratory failure with hypoxia due to COPD exacerbation and mild component of volume overload  -improved with Iv steroids, Abx, nebs -cut down IV solumedrol, changed to Prednisone taper at DC -Po levaquin for few more days -smoking cessation counseled  Uncontrolled hypertension  -Related to noncompliance with antihypertensive medications -Continue Bystolic to 20 mg daily, clonidine 0.2 mg BID, Cardizem 120 mg daily, hydralazine 100 mg TID, Imdur 30 mg BID  -improved on current regimen   Tachycardia  -improved, continue Cardizem and Bystolic 20  mg daily   CKD (chronic kidney disease) stage V requiring chronic dialysis  -HD per Renal  OSA (obstructive sleep apnea)  -Noncompliant with CPAP at home as well as here   Headache  -CT head negative-suspect etiology related to uncontrolled blood pressure    Consultations:  Renal  Discharge Exam: Filed Vitals:   01/17/14 1613  BP: 140/85  Pulse: 105  Temp: 98 F (36.7 C)  Resp: 16    General:AAOx3 Cardiovascular: S1S2/RRR Respiratory: improved air movt  Discharge Instructions You were cared for by a hospitalist during your hospital stay. If you have any questions about your discharge medications or the care you received while you were in the hospital after you are discharged, you can call the unit and asked to speak with the hospitalist on call if the hospitalist that took care of you is not available. Once you are discharged, your primary care physician will handle any further medical issues. Please note that NO REFILLS for any discharge medications will be authorized once you are discharged, as it is imperative that you return to your primary care physician (or establish a relationship with a primary care physician if you do not have one) for your aftercare needs so that they can reassess your need for medications and monitor your lab values.  Discharge Instructions   Discharge instructions    Complete by:  As directed   Renal Diet     Increase activity slowly    Complete by:  As directed           Current Discharge Medication List  START taking these medications   Details  !! albuterol (PROVENTIL HFA;VENTOLIN HFA) 108 (90 BASE) MCG/ACT inhaler Inhale 2 puffs into the lungs every 6 (six) hours as needed for wheezing or shortness of breath. Qty: 1 Inhaler, Refills: 2    budesonide-formoterol (SYMBICORT) 80-4.5 MCG/ACT inhaler Inhale 2 puffs into the lungs 2 (two) times daily. Qty: 1 Inhaler, Refills: 1    predniSONE (DELTASONE) 20 MG tablet Take  for 2days  then  for 2days then STOP Qty: 6 tablet, Refills: 0     !! - Potential duplicate medications found. Please discuss with provider.    CONTINUE these medications which have CHANGED   Details  ALPRAZolam (XANAX) 0.5 MG tablet Take 1 tablet (0.5 mg total) by mouth 3 (three) times daily as needed for anxiety. Qty: 10 tablet, Refills: 0      CONTINUE these medications which have NOT CHANGED   Details  acetaminophen (TYLENOL) 500 MG tablet Take 1,000 mg by mouth every 6 (six) hours as needed (pain).     !! albuterol (PROVENTIL HFA;VENTOLIN HFA) 108 (90 BASE) MCG/ACT inhaler Inhale 2 puffs into the lungs every 6 (six) hours as needed for wheezing or shortness of breath.    albuterol (PROVENTIL) (2.5 MG/3ML) 0.083% nebulizer solution Take 3 mLs (2.5 mg total) by nebulization every 6 (six) hours as needed. For wheezing. Qty: 75 mL, Refills: 5    amLODipine (NORVASC) 10 MG tablet Take 10 mg by mouth at bedtime.    calcium acetate (PHOSLO) 667 MG capsule Take 667 mg by mouth daily with supper.    calcium carbonate (TUMS EX) 750 MG chewable tablet Chew 1 tablet by mouth 2 (two) times daily as needed for heartburn.     cloNIDine (CATAPRES) 0.2 MG tablet Take 0.2 mg by mouth 2 (two) times daily.    diltiazem (CARDIZEM CD) 120 MG 24 hr capsule Take 120 mg by mouth at bedtime.     diphenhydrAMINE (BENADRYL) 25 MG tablet Take 25 mg by mouth every 6 (six) hours as needed for itching.     hydrALAZINE (APRESOLINE) 100 MG tablet Take 100 mg by mouth 3 (three) times daily.    isosorbide mononitrate (IMDUR) 60 MG 24 hr tablet Take 60 mg by mouth daily as needed (blood pressure close to 200).    Multiple Vitamin (MULTIVITAMIN WITH MINERALS) TABS tablet Take 1 tablet by mouth daily.    roflumilast (DALIRESP) 500 MCG TABS tablet Take 500 mcg by mouth daily.    tiotropium (SPIRIVA) 18 MCG inhalation capsule Place 18 mcg into inhaler and inhale daily.    Valerian Root 500 MG CAPS Take 500 mg by  mouth daily as needed (anxiety).     vitamin C (ASCORBIC ACID) 500 MG tablet Take 500 mg by mouth daily.     !! - Potential duplicate medications found. Please discuss with provider.    STOP taking these medications     gabapentin (NEURONTIN) 100 MG capsule      Nebivolol HCl (BYSTOLIC) 20 MG TABS      oxyCODONE (ROXICODONE) 5 MG immediate release tablet      pantoprazole (PROTONIX) 40 MG tablet      Umeclidinium-Vilanterol (ANORO ELLIPTA) 62.5-25 MCG/INH AEPB        No Known Allergies Follow-up Information   Follow up with CAMPBELL, STEPHEN D., MD In 1 week.   Specialty:  Internal Medicine   Contact information:   [address] [place] [address] [phone]  The results of significant diagnostics from this hospitalization (including imaging, microbiology, ancillary and laboratory) are listed below for reference.    Significant Diagnostic Studies: Dg Chest 2 View  01/14/2014   CLINICAL DATA:  Chest pain, shortness of breath  EXAM: CHEST  2 VIEW  COMPARISON:  01/14/2014 at 10:48 a.m. at [facility], available for comparison on [facility] P[facility]  FINDINGS: The heart size is at upper limits of normal. Both lungs are hypoaerated with minimal bibasilar atelectasis reidentified. The visualized skeletal structures are unremarkable.  IMPRESSION: No significant change since prior exam obtained earlier today at [facility] at 10:48 a.m., available for comparison on [facility].   Electronically Signed   By: Christiana Pellant M.D.   On: 01/14/2014 18:04   Ct Head Wo Contrast  01/15/2014   CLINICAL DATA:  Severe right occipital headache for 1 week with dizziness.  EXAM: CT HEAD WITHOUT CONTRAST  TECHNIQUE: Contiguous axial images were obtained from the base of the skull through the vertex without intravenous contrast.  COMPARISON:  Prior CT from 01/14/2014  FINDINGS: Diffuse prominence of the CSF containing spaces is compatible with generalized cerebral  atrophy. Scattered and confluent hypodensity within the periventricular and deep white matter both cerebral hemispheres is most consistent with mild chronic small vessel ischemic disease.  No acute intracranial hemorrhage or infarct. No mass lesion or midline shift. No hydrocephalus. No extra-axial fluid collection.  Calvarium is intact. No acute abnormality seen within the scalp soft tissues. Orbital soft tissues within normal limits.  IMPRESSION: 1. No acute intracranial abnormality. No findings to explain headaches. 2. Atrophy with chronic microvascular ischemic disease.   Electronically Signed   By: Rise Mu M.D.   On: 01/15/2014 06:33    Microbiology: Recent Results (from the past 240 hour(s))  MRSA PCR SCREENING     Status: None   Collection Time    01/14/14  4:44 PM      Result Value Ref Range Status   MRSA by PCR NEGATIVE  NEGATIVE Final   Comment:            The GeneXpert MRSA Assay (FDA     approved for NASAL specimens     only), is one component of a     comprehensive MRSA colonization     surveillance program. It is not     intended to diagnose MRSA     infection nor to guide or     monitor treatment for     MRSA infections.     Labs: Basic Metabolic Panel:  Recent Labs Lab 01/14/14 1724 01/15/14 0350 01/16/14 0620 01/17/14 1217  NA 130* 129* 137 131*  K 4.7 4.5 3.5* 4.6  CL 86* 87* 95* 87*  CO2 21 17* 24 19  GLUCOSE 143* 164* 104* 116*  BUN 41* 54* 27* 72*  CREATININE 6.48* 7.04* 3.93* 6.16*  CALCIUM 8.7 8.5 8.9 8.9  PHOS  --   --   --  4.6   Liver Function Tests:  Recent Labs Lab 01/14/14 1724 01/17/14 1217  AST 48*  --   ALT 26  --   ALKPHOS 83  --   BILITOT 0.3  --   PROT 7.1  --   ALBUMIN 3.7 3.5   No results found for this basename: LIPASE, AMYLASE,  in the last 168 hours No results found for this basename: AMMONIA,  in the last 168 hours CBC:  Recent Labs Lab 01/14/14 1724 01/15/14 0350 01/16/14 0620 01/17/14 1217  WBC  6.4  9.9 11.3* 13.3*  HGB 11.2* 10.9* 10.8* 10.4*  HCT 32.4* 31.7* 32.0* 30.3*  MCV 98.2 98.1 101.9* 97.7  PLT 207 189 194 211   Cardiac Enzymes:  Recent Labs Lab 01/14/14 2010  TROPONINI <0.30   BNP: BNP (last 3 results) No results found for this basename: PROBNP,  in the last 8760 hours CBG: No results found for this basename: GLUCAP,  in the last 168 hours     Signed:  Heddy Vidana  [facility] 01/17/2014, 4:23 PM

## 2014-01-17 NOTE — Progress Notes (Signed)
Patient is ready for discharge home. Waiting for daughter coming from [place]. Discharge instruction given and verbalized appointment with MD in a week and to continue medications on instruction.

## 2014-01-17 NOTE — Procedures (Signed)
I was present at this dialysis session, have reviewed the session itself and made  appropriate changes  Vinson Moselle MD (pgr) [phone]    ([phone] 01/17/2014, 1:42 PM

## 2014-02-09 ENCOUNTER — Encounter (HOSPITAL_COMMUNITY): Payer: Self-pay | Admitting: Emergency Medicine

## 2014-02-09 ENCOUNTER — Inpatient Hospital Stay (HOSPITAL_COMMUNITY)
Admission: EM | Admit: 2014-02-09 | Discharge: 2014-02-18 | DRG: 640 | Disposition: A | Discharge status: Home / Self Care | Payer: Medicaid Other | Attending: Internal Medicine | Admitting: Internal Medicine

## 2014-02-09 ENCOUNTER — Emergency Department (HOSPITAL_COMMUNITY): Payer: Medicaid Other

## 2014-02-09 DIAGNOSIS — I739 Peripheral vascular disease, unspecified: Secondary | ICD-10-CM | POA: Diagnosis present

## 2014-02-09 DIAGNOSIS — I120 Hypertensive chronic kidney disease with stage 5 chronic kidney disease or end stage renal disease: Secondary | ICD-10-CM | POA: Diagnosis present

## 2014-02-09 DIAGNOSIS — F10239 Alcohol dependence with withdrawal, unspecified: Secondary | ICD-10-CM | POA: Diagnosis present

## 2014-02-09 DIAGNOSIS — I4892 Unspecified atrial flutter: Secondary | ICD-10-CM

## 2014-02-09 DIAGNOSIS — G4733 Obstructive sleep apnea (adult) (pediatric): Secondary | ICD-10-CM | POA: Diagnosis present

## 2014-02-09 DIAGNOSIS — F17200 Nicotine dependence, unspecified, uncomplicated: Secondary | ICD-10-CM | POA: Diagnosis present

## 2014-02-09 DIAGNOSIS — J449 Chronic obstructive pulmonary disease, unspecified: Secondary | ICD-10-CM | POA: Diagnosis present

## 2014-02-09 DIAGNOSIS — N2581 Secondary hyperparathyroidism of renal origin: Secondary | ICD-10-CM | POA: Diagnosis present

## 2014-02-09 DIAGNOSIS — N186 End stage renal disease: Secondary | ICD-10-CM | POA: Diagnosis present

## 2014-02-09 DIAGNOSIS — Z9989 Dependence on other enabling machines and devices: Secondary | ICD-10-CM

## 2014-02-09 DIAGNOSIS — R748 Abnormal levels of other serum enzymes: Secondary | ICD-10-CM | POA: Diagnosis present

## 2014-02-09 DIAGNOSIS — F17210 Nicotine dependence, cigarettes, uncomplicated: Secondary | ICD-10-CM | POA: Diagnosis present

## 2014-02-09 DIAGNOSIS — R7989 Other specified abnormal findings of blood chemistry: Secondary | ICD-10-CM

## 2014-02-09 DIAGNOSIS — E8779 Other fluid overload: Principal | ICD-10-CM | POA: Diagnosis present

## 2014-02-09 DIAGNOSIS — E785 Hyperlipidemia, unspecified: Secondary | ICD-10-CM | POA: Diagnosis present

## 2014-02-09 DIAGNOSIS — E8729 Other acidosis: Secondary | ICD-10-CM

## 2014-02-09 DIAGNOSIS — J810 Acute pulmonary edema: Secondary | ICD-10-CM | POA: Diagnosis present

## 2014-02-09 DIAGNOSIS — E872 Acidosis: Secondary | ICD-10-CM | POA: Diagnosis present

## 2014-02-09 DIAGNOSIS — R778 Other specified abnormalities of plasma proteins: Secondary | ICD-10-CM

## 2014-02-09 DIAGNOSIS — R45851 Suicidal ideations: Secondary | ICD-10-CM | POA: Diagnosis present

## 2014-02-09 DIAGNOSIS — D638 Anemia in other chronic diseases classified elsewhere: Secondary | ICD-10-CM | POA: Diagnosis present

## 2014-02-09 DIAGNOSIS — F42 Obsessive-compulsive disorder: Secondary | ICD-10-CM | POA: Diagnosis present

## 2014-02-09 DIAGNOSIS — R4182 Altered mental status, unspecified: Secondary | ICD-10-CM

## 2014-02-09 DIAGNOSIS — I2510 Atherosclerotic heart disease of native coronary artery without angina pectoris: Secondary | ICD-10-CM | POA: Diagnosis present

## 2014-02-09 DIAGNOSIS — Z9119 Patient's noncompliance with other medical treatment and regimen: Secondary | ICD-10-CM | POA: Diagnosis present

## 2014-02-09 DIAGNOSIS — I509 Heart failure, unspecified: Secondary | ICD-10-CM | POA: Diagnosis present

## 2014-02-09 DIAGNOSIS — Z992 Dependence on renal dialysis: Secondary | ICD-10-CM | POA: Diagnosis not present

## 2014-02-09 DIAGNOSIS — I483 Typical atrial flutter: Secondary | ICD-10-CM

## 2014-02-09 DIAGNOSIS — J440 Chronic obstructive pulmonary disease with acute lower respiratory infection: Secondary | ICD-10-CM | POA: Diagnosis present

## 2014-02-09 DIAGNOSIS — J690 Pneumonitis due to inhalation of food and vomit: Secondary | ICD-10-CM | POA: Diagnosis present

## 2014-02-09 DIAGNOSIS — R079 Chest pain, unspecified: Secondary | ICD-10-CM | POA: Diagnosis not present

## 2014-02-09 DIAGNOSIS — I441 Atrioventricular block, second degree: Secondary | ICD-10-CM | POA: Diagnosis present

## 2014-02-09 DIAGNOSIS — I701 Atherosclerosis of renal artery: Secondary | ICD-10-CM

## 2014-02-09 DIAGNOSIS — Z7952 Long term (current) use of systemic steroids: Secondary | ICD-10-CM | POA: Diagnosis not present

## 2014-02-09 DIAGNOSIS — E871 Hypo-osmolality and hyponatremia: Secondary | ICD-10-CM | POA: Diagnosis present

## 2014-02-09 DIAGNOSIS — I471 Supraventricular tachycardia: Secondary | ICD-10-CM | POA: Diagnosis present

## 2014-02-09 DIAGNOSIS — A047 Enterocolitis due to Clostridium difficile: Secondary | ICD-10-CM | POA: Diagnosis present

## 2014-02-09 DIAGNOSIS — I252 Old myocardial infarction: Secondary | ICD-10-CM | POA: Diagnosis not present

## 2014-02-09 DIAGNOSIS — J9601 Acute respiratory failure with hypoxia: Secondary | ICD-10-CM | POA: Diagnosis present

## 2014-02-09 DIAGNOSIS — R0902 Hypoxemia: Secondary | ICD-10-CM

## 2014-02-09 DIAGNOSIS — J9600 Acute respiratory failure, unspecified whether with hypoxia or hypercapnia: Secondary | ICD-10-CM | POA: Diagnosis present

## 2014-02-09 DIAGNOSIS — I5022 Chronic systolic (congestive) heart failure: Secondary | ICD-10-CM

## 2014-02-09 DIAGNOSIS — F419 Anxiety disorder, unspecified: Secondary | ICD-10-CM | POA: Diagnosis present

## 2014-02-09 DIAGNOSIS — F329 Major depressive disorder, single episode, unspecified: Secondary | ICD-10-CM | POA: Diagnosis present

## 2014-02-09 DIAGNOSIS — F1010 Alcohol abuse, uncomplicated: Secondary | ICD-10-CM

## 2014-02-09 DIAGNOSIS — I10 Essential (primary) hypertension: Secondary | ICD-10-CM | POA: Diagnosis present

## 2014-02-09 DIAGNOSIS — A0472 Enterocolitis due to Clostridium difficile, not specified as recurrent: Secondary | ICD-10-CM | POA: Diagnosis present

## 2014-02-09 DIAGNOSIS — F32A Depression, unspecified: Secondary | ICD-10-CM

## 2014-02-09 LAB — BASIC METABOLIC PANEL
ANION GAP: 41 — AB (ref 5–15)
BUN: 86 mg/dL — ABNORMAL HIGH (ref 6–23)
CO2: 13 mEq/L — ABNORMAL LOW (ref 19–32)
Calcium: 8.9 mg/dL (ref 8.4–10.5)
Chloride: 75 mEq/L — ABNORMAL LOW (ref 96–112)
Creatinine, Ser: 10.7 mg/dL — ABNORMAL HIGH (ref 0.50–1.35)
GFR calc non Af Amer: 5 mL/min — ABNORMAL LOW (ref >90)
GFR, EST AFRICAN AMERICAN: 6 mL/min — AB (ref >90)
Glucose, Bld: 121 mg/dL — ABNORMAL HIGH (ref 70–99)
POTASSIUM: 4 meq/L (ref 3.7–5.3)
Sodium: 129 mEq/L — ABNORMAL LOW (ref 137–147)

## 2014-02-09 LAB — CBC WITH DIFFERENTIAL/PLATELET
BASOS PCT: 0 % (ref 0–1)
Basophils Absolute: 0 10*3/uL (ref 0.0–0.1)
EOS PCT: 0 % (ref 0–5)
Eosinophils Absolute: 0 10*3/uL (ref 0.0–0.7)
HCT: 27.1 % — ABNORMAL LOW (ref 39.0–52.0)
HEMOGLOBIN: 9.3 g/dL — AB (ref 13.0–17.0)
Lymphocytes Relative: 5 % — ABNORMAL LOW (ref 12–46)
Lymphs Abs: 0.7 10*3/uL (ref 0.7–4.0)
MCH: 32.9 pg (ref 26.0–34.0)
MCHC: 34.3 g/dL (ref 30.0–36.0)
MCV: 95.8 fL (ref 78.0–100.0)
MONOS PCT: 6 % (ref 3–12)
Monocytes Absolute: 0.9 10*3/uL (ref 0.1–1.0)
NEUTROS PCT: 89 % — AB (ref 43–77)
Neutro Abs: 13.3 10*3/uL — ABNORMAL HIGH (ref 1.7–7.7)
Platelets: 372 10*3/uL (ref 150–400)
RBC: 2.83 MIL/uL — AB (ref 4.22–5.81)
RDW: 14.5 % (ref 11.5–15.5)
WBC: 14.9 10*3/uL — AB (ref 4.0–10.5)

## 2014-02-09 LAB — PROCALCITONIN: Procalcitonin: 3.66 ng/mL

## 2014-02-09 LAB — HEPATIC FUNCTION PANEL
ALT: 22 U/L (ref 0–53)
AST: 39 U/L — ABNORMAL HIGH (ref 0–37)
Albumin: 3.3 g/dL — ABNORMAL LOW (ref 3.5–5.2)
Alkaline Phosphatase: 112 U/L (ref 39–117)
Bilirubin, Direct: <0.2 mg/dL (ref 0.0–0.3)
Total Bilirubin: 0.3 mg/dL (ref 0.3–1.2)
Total Protein: 7.8 g/dL (ref 6.0–8.3)

## 2014-02-09 LAB — OSMOLALITY: OSMOLALITY: 309 mosm/kg — AB (ref 275–300)

## 2014-02-09 LAB — LACTIC ACID, PLASMA: Lactic Acid, Venous: 1.5 mmol/L (ref 0.5–2.2)

## 2014-02-09 LAB — I-STAT ARTERIAL BLOOD GAS, ED
Acid-base deficit: 8 mmol/L — ABNORMAL HIGH (ref 0.0–2.0)
Bicarbonate: 16.5 mEq/L — ABNORMAL LOW (ref 20.0–24.0)
O2 Saturation: 95 %
PH ART: 7.367 (ref 7.350–7.450)
Patient temperature: 37
TCO2: 17 mmol/L (ref 0–100)
pCO2 arterial: 28.8 mmHg — ABNORMAL LOW (ref 35.0–45.0)
pO2, Arterial: 77 mmHg — ABNORMAL LOW (ref 80.0–100.0)

## 2014-02-09 LAB — PRO B NATRIURETIC PEPTIDE

## 2014-02-09 LAB — ETHANOL

## 2014-02-09 LAB — SALICYLATE LEVEL

## 2014-02-09 LAB — I-STAT CG4 LACTIC ACID, ED: LACTIC ACID, VENOUS: 1.45 mmol/L (ref 0.5–2.2)

## 2014-02-09 LAB — ALCOHOL, METHYL (METHANOL), BLOOD: METHANOL LVL: NOT DETECTED

## 2014-02-09 LAB — AMMONIA: Ammonia: 26 umol/L (ref 11–60)

## 2014-02-09 LAB — ACETAMINOPHEN LEVEL: Acetaminophen (Tylenol), Serum: <15 ug/mL (ref 10–30)

## 2014-02-09 LAB — TROPONIN I: Troponin I: 0.51 ng/mL (ref <0.30)

## 2014-02-09 MED ORDER — DILTIAZEM HCL 100 MG IV SOLR
5.0000 mg/h | INTRAVENOUS | Status: DC
  Administered 2014-02-09 – 2014-02-10 (×2): 5 mg/h via INTRAVENOUS
  Administered 2014-02-10: 10 mg/h via INTRAVENOUS

## 2014-02-09 MED ORDER — HEPARIN SODIUM (PORCINE) 1000 UNIT/ML DIALYSIS: 3000.0000 [IU] | Freq: Once | INTRAMUSCULAR | Status: DC

## 2014-02-09 MED ORDER — NITROGLYCERIN 0.4 MG SL SUBL: 0.4000 mg | SUBLINGUAL_TABLET | SUBLINGUAL | Status: DC | PRN

## 2014-02-09 MED ORDER — HEPARIN SODIUM (PORCINE) 5000 UNIT/ML IJ SOLN
5000.0000 [IU] | Freq: Three times a day (TID) | INTRAMUSCULAR | Status: DC
  Administered 2014-02-09 – 2014-02-18 (×26): 5000 [IU] via SUBCUTANEOUS
  Filled 2014-02-09 (×30): qty 1

## 2014-02-09 MED ORDER — DEXTROSE 5 % IV SOLN
1.0000 g | INTRAVENOUS | Status: DC
  Administered 2014-02-10 – 2014-02-11 (×2): 1 g via INTRAVENOUS
  Filled 2014-02-09 (×3): qty 1

## 2014-02-09 MED ORDER — BUDESONIDE-FORMOTEROL FUMARATE 80-4.5 MCG/ACT IN AERO
2.0000 | INHALATION_SPRAY | Freq: Two times a day (BID) | RESPIRATORY_TRACT | Status: DC
  Administered 2014-02-10 – 2014-02-18 (×15): 2 via RESPIRATORY_TRACT
  Filled 2014-02-09 (×2): qty 6.9

## 2014-02-09 MED ORDER — ISOSORBIDE MONONITRATE ER 60 MG PO TB24
60.0000 mg | ORAL_TABLET | Freq: Every day | ORAL | Status: DC
  Administered 2014-02-10 – 2014-02-18 (×8): 60 mg via ORAL
  Filled 2014-02-09 (×9): qty 1

## 2014-02-09 MED ORDER — ALTEPLASE 2 MG IJ SOLR
2.0000 mg | Freq: Once | INTRAMUSCULAR | Status: AC | PRN
  Filled 2014-02-09: qty 2

## 2014-02-09 MED ORDER — ALBUTEROL SULFATE (2.5 MG/3ML) 0.083% IN NEBU
2.5000 mg | INHALATION_SOLUTION | RESPIRATORY_TRACT | Status: DC | PRN
  Administered 2014-02-10 – 2014-02-13 (×6): 2.5 mg via RESPIRATORY_TRACT
  Filled 2014-02-09 (×6): qty 3

## 2014-02-09 MED ORDER — CALCIUM ACETATE 667 MG PO CAPS
667.0000 mg | ORAL_CAPSULE | Freq: Every day | ORAL | Status: DC
  Administered 2014-02-10 – 2014-02-17 (×8): 667 mg via ORAL
  Filled 2014-02-09 (×10): qty 1

## 2014-02-09 MED ORDER — ISOSORBIDE MONONITRATE ER 60 MG PO TB24: 60.0000 mg | ORAL_TABLET | Freq: Every day | ORAL | Status: DC | PRN

## 2014-02-09 MED ORDER — DEXTROSE 5 % IV SOLN
1.0000 g | Freq: Once | INTRAVENOUS | Status: AC
  Administered 2014-02-09: 1 g via INTRAVENOUS
  Filled 2014-02-09: qty 1

## 2014-02-09 MED ORDER — HEPARIN SODIUM (PORCINE) 1000 UNIT/ML DIALYSIS
1000.0000 [IU] | INTRAMUSCULAR | Status: DC | PRN
  Filled 2014-02-09: qty 1

## 2014-02-09 MED ORDER — DILTIAZEM LOAD VIA INFUSION
15.0000 mg | Freq: Once | INTRAVENOUS | Status: DC
  Filled 2014-02-09: qty 15

## 2014-02-09 MED ORDER — LIDOCAINE HCL (PF) 1 % IJ SOLN: 5.0000 mL | INTRAMUSCULAR | Status: DC | PRN

## 2014-02-09 MED ORDER — VANCOMYCIN HCL IN DEXTROSE 1-5 GM/200ML-% IV SOLN
1000.0000 mg | Freq: Once | INTRAVENOUS | Status: AC
  Administered 2014-02-09: 1000 mg via INTRAVENOUS
  Filled 2014-02-09: qty 200

## 2014-02-09 MED ORDER — ALBUTEROL SULFATE HFA 108 (90 BASE) MCG/ACT IN AERS: 2.0000 | INHALATION_SPRAY | Freq: Four times a day (QID) | RESPIRATORY_TRACT | Status: DC | PRN

## 2014-02-09 MED ORDER — LIDOCAINE-PRILOCAINE 2.5-2.5 % EX CREA: 1.0000 "application " | TOPICAL_CREAM | CUTANEOUS | Status: DC | PRN

## 2014-02-09 MED ORDER — NEPRO/CARBSTEADY PO LIQD
237.0000 mL | ORAL | Status: DC | PRN
  Filled 2014-02-09: qty 237

## 2014-02-09 MED ORDER — ROFLUMILAST 500 MCG PO TABS
500.0000 ug | ORAL_TABLET | Freq: Every day | ORAL | Status: DC
  Administered 2014-02-10 – 2014-02-18 (×9): 500 ug via ORAL
  Filled 2014-02-09 (×9): qty 1

## 2014-02-09 MED ORDER — ALPRAZOLAM 0.5 MG PO TABS
0.5000 mg | ORAL_TABLET | Freq: Three times a day (TID) | ORAL | Status: DC | PRN
  Administered 2014-02-09 – 2014-02-11 (×5): 0.5 mg via ORAL
  Filled 2014-02-09 (×4): qty 1

## 2014-02-09 MED ORDER — CLONIDINE HCL 0.2 MG PO TABS
0.2000 mg | ORAL_TABLET | Freq: Two times a day (BID) | ORAL | Status: DC
  Administered 2014-02-09 – 2014-02-12 (×6): 0.2 mg via ORAL
  Filled 2014-02-09 (×9): qty 1

## 2014-02-09 MED ORDER — NITROGLYCERIN 0.4 MG SL SUBL
SUBLINGUAL_TABLET | SUBLINGUAL | Status: AC
  Administered 2014-02-09: 0.4 mg
  Filled 2014-02-09: qty 1

## 2014-02-09 MED ORDER — TIOTROPIUM BROMIDE MONOHYDRATE 18 MCG IN CAPS
18.0000 ug | ORAL_CAPSULE | Freq: Every day | RESPIRATORY_TRACT | Status: DC
  Administered 2014-02-10 – 2014-02-18 (×7): 18 ug via RESPIRATORY_TRACT
  Filled 2014-02-09 (×2): qty 5

## 2014-02-09 MED ORDER — SODIUM CHLORIDE 0.9 % IV SOLN: 100.0000 mL | INTRAVENOUS | Status: DC | PRN

## 2014-02-09 MED ORDER — MORPHINE SULFATE 2 MG/ML IJ SOLN
2.0000 mg | Freq: Once | INTRAMUSCULAR | Status: AC
Start: 2014-02-09 — End: 2014-02-09
  Administered 2014-02-09: 2 mg via INTRAVENOUS

## 2014-02-09 MED ORDER — HYDROCODONE-ACETAMINOPHEN 5-325 MG PO TABS
1.0000 | ORAL_TABLET | ORAL | Status: DC | PRN
  Administered 2014-02-10: 2 via ORAL
  Administered 2014-02-10: 1 via ORAL
  Administered 2014-02-11 – 2014-02-12 (×6): 2 via ORAL
  Administered 2014-02-13: 1 via ORAL
  Administered 2014-02-13: 2 via ORAL
  Administered 2014-02-13: 1 via ORAL
  Administered 2014-02-14 – 2014-02-17 (×10): 2 via ORAL
  Filled 2014-02-09 (×2): qty 1
  Filled 2014-02-09 (×4): qty 2
  Filled 2014-02-09: qty 1
  Filled 2014-02-09 (×10): qty 2

## 2014-02-09 MED ORDER — DILTIAZEM HCL 25 MG/5ML IV SOLN
5.0000 mg | Freq: Once | INTRAVENOUS | Status: DC
  Filled 2014-02-09: qty 5

## 2014-02-09 MED ORDER — MORPHINE SULFATE 2 MG/ML IJ SOLN
INTRAMUSCULAR | Status: AC
  Filled 2014-02-09: qty 1

## 2014-02-09 MED ORDER — SODIUM CHLORIDE 0.9 % IJ SOLN
3.0000 mL | Freq: Two times a day (BID) | INTRAMUSCULAR | Status: DC
  Administered 2014-02-09 – 2014-02-18 (×15): 3 mL via INTRAVENOUS

## 2014-02-09 MED ORDER — PENTAFLUOROPROP-TETRAFLUOROETH EX AERO: 1.0000 "application " | INHALATION_SPRAY | CUTANEOUS | Status: DC | PRN

## 2014-02-09 MED ORDER — SODIUM CHLORIDE 0.9 % IV SOLN
125.0000 mg | INTRAVENOUS | Status: DC
  Administered 2014-02-12 – 2014-02-16 (×2): 125 mg via INTRAVENOUS
  Filled 2014-02-09 (×5): qty 10

## 2014-02-09 MED ORDER — ASPIRIN EC 325 MG PO TBEC
325.0000 mg | DELAYED_RELEASE_TABLET | Freq: Every day | ORAL | Status: DC
  Administered 2014-02-10 – 2014-02-18 (×8): 325 mg via ORAL
  Filled 2014-02-09 (×8): qty 1

## 2014-02-09 MED ORDER — SODIUM CHLORIDE 0.9 % IV SOLN
100.0000 mL | INTRAVENOUS | Status: DC | PRN
Start: 2014-02-09 — End: 2014-02-11

## 2014-02-09 NOTE — Progress Notes (Signed)
Pt agreed to wear bipap for a short time. Pts heart rate uncontrolled at this time ranging from 130-170's and volume overloaded due to missing dialysis Thursday. Pt transported to emergent dialysis on bipap with RN. Pt going to 3S room 8 after completion of dialysis

## 2014-02-09 NOTE — H&P (Addendum)
[facility] History and Physical  [name] ZOX:[id number] DOB: 1960-07-13 DOA: 02/09/2014  Referring physician: ED PCP: Wilmer Floor., MD  Specialists: nephrology  Chief Complaint: AMS, pna  HPI:  53 y/o ? h/o OSA, former smoker, Diastolic HF[Prior NICM/cardiogenic shock 30% with imporvement-50% 2013], ESRD TTS, PAD, COPD, Prior R Renal artery stent, poorly controlled Htn resented from dialysis Center with a multitude of complaints x-ray is remarkably secure she is in poor historian and tells me multiple unrelated complaints at the bedside. He states first he was noted to be short of breath and wobbling at the dialysis center and was feeling more short of breath than usual. He then at that he has had nausea and vomiting over the past evening as well as low-grade fever. He states he's coughed up some blood as well. No dark stools however or any outside food He states he is depressed and has been trying to "slowly kill myself" with chronic alcoholism and binge on Thursday and had over a gallon of vodka on that day. He currently smokes 2 cigarettes a day but cut back over the past month but then after 2 packs a day.  He tells me that he has some chest wall pain that started on that side yesterday and is like in light pain and pressure in the last 1-2 minutes and there is no radiating pain component at this to his neck or his job. He tells me he is compliant on all his medications but missed some doses of his meds yesterday as he had vomiting  ABG 7.36 PCO2 28.8 PaO2 77 Sodium 129 potassium 4.0 chloride 75 BUN 86 creatinine 10.7 CO2 13 anion gap 41 AST 39 ALT 22 alkaline phosphatase 112 Point-of-care troponin 0.51 WBC 14.9 hemoglobin 9.3 platelets 372  EKG = H. of fibrillation 2-1 block QRS axis 90 test. Changes in V2 through and V6 with peak T waves ST depressions in V4 through 6 with PVC and ST elevation in V2 V3.   Review of Systems:   Past Medical History  Diagnosis  Date  . Hypertension   . Systolic heart failure     EF 81-19%  . Hyperlipidemia   . COPD (chronic obstructive pulmonary disease)   . Peripheral vascular disease   . Pneumonia 1980's  . OSA on CPAP   . ETOH abuse   . History of stomach ulcers   . Arthritis     "arms"  . Chronic lower back pain   . Anxiety   . Depression   . Adult ADHD     "never diagnosed; my son was; I think I've got it too"  . Chronic kidney disease (CKD), stage III (moderate)     baseline cr 2.3  . Renal cyst     left, complex  . Renal artery stenosis     a. s/p BMS to inferior branch of right renal artery 12/2011. On left, 3 renal arteries were noted, 2 of them were very small in size and subtotally occluded  . Anginal pain   . CHF (congestive heart failure)   . Seasonal allergies   . Hepatitis     "I think it was C"   Past Surgical History  Procedure Laterality Date  . Renal artery stent  01/05/2012    right inferior  . No past surgeries    . Av fistula placement Left 10/10/2012    Procedure: ARTERIOVENOUS (AV) FISTULA CREATION;  Surgeon: Sherren Kerns, MD;  Location: [facility] OR;  Service: Vascular;  Laterality: Left;  Brachio-cephalic   Social History:  History   Social History Narrative  . No narrative on file    No Known Allergies  Family History  Problem Relation Age of Onset  . Brain cancer Mother   . Pancreatic cancer Father   . Heart failure Paternal Uncle   . Heart disease Paternal Uncle   . Heart failure Paternal Grandfather   . Heart disease Paternal Grandfather     Prior to Admission medications   Medication Sig Start Date End Date Taking? Authorizing Provider  acetaminophen (TYLENOL) 500 MG tablet Take 1,000 mg by mouth every 6 (six) hours as needed (pain).     Historical Provider, MD  albuterol (PROVENTIL HFA;VENTOLIN HFA) 108 (90 BASE) MCG/ACT inhaler Inhale 2 puffs into the lungs every 6 (six) hours as needed for wheezing or shortness of breath.    Historical Provider, MD   albuterol (PROVENTIL HFA;VENTOLIN HFA) 108 (90 BASE) MCG/ACT inhaler Inhale 2 puffs into the lungs every 6 (six) hours as needed for wheezing or shortness of breath. 01/17/14   Zannie Cove, MD  albuterol (PROVENTIL) (2.5 MG/3ML) 0.083% nebulizer solution Take 3 mLs (2.5 mg total) by nebulization every 6 (six) hours as needed. For wheezing. 02/15/13   Coralyn Helling, MD  ALPRAZolam Prudy Feeler) 0.5 MG tablet Take 1 tablet (0.5 mg total) by mouth 3 (three) times daily as needed for anxiety. 01/17/14   Zannie Cove, MD  amLODipine (NORVASC) 10 MG tablet Take 10 mg by mouth at bedtime.    Historical Provider, MD  budesonide-formoterol (SYMBICORT) 80-4.5 MCG/ACT inhaler Inhale 2 puffs into the lungs 2 (two) times daily. 01/17/14   Zannie Cove, MD  calcium acetate (PHOSLO) 667 MG capsule Take 667 mg by mouth daily with supper.    Historical Provider, MD  calcium carbonate (TUMS EX) 750 MG chewable tablet Chew 1 tablet by mouth 2 (two) times daily as needed for heartburn.     Historical Provider, MD  cloNIDine (CATAPRES) 0.2 MG tablet Take 0.2 mg by mouth 2 (two) times daily.    Historical Provider, MD  diltiazem (CARDIZEM CD) 120 MG 24 hr capsule Take 120 mg by mouth at bedtime.     Historical Provider, MD  diphenhydrAMINE (BENADRYL) 25 MG tablet Take 25 mg by mouth every 6 (six) hours as needed for itching.     Historical Provider, MD  hydrALAZINE (APRESOLINE) 100 MG tablet Take 100 mg by mouth 3 (three) times daily.    Historical Provider, MD  isosorbide mononitrate (IMDUR) 60 MG 24 hr tablet Take 60 mg by mouth daily as needed (blood pressure close to 200).    Historical Provider, MD  Multiple Vitamin (MULTIVITAMIN WITH MINERALS) TABS tablet Take 1 tablet by mouth daily.    Historical Provider, MD  predniSONE (DELTASONE) 20 MG tablet Take 40mg  for 2days then 20mg  for 2days then STOP 01/17/14   Zannie Cove, MD  roflumilast (DALIRESP) 500 MCG TABS tablet Take 500 mcg by mouth daily.    Historical Provider,  MD  tiotropium (SPIRIVA) 18 MCG inhalation capsule Place 18 mcg into inhaler and inhale daily.    Historical Provider, MD  Valerian Root 500 MG CAPS Take 500 mg by mouth daily as needed (anxiety).     Historical Provider, MD  vitamin C (ASCORBIC ACID) 500 MG tablet Take 500 mg by mouth daily.    Historical Provider, MD   Physical Exam: Filed Vitals:   02/09/14 1309 02/09/14 1311  BP: 157/86  Pulse: 113   Temp: 98.1 F (36.7 C)   TempSrc: Oral   Resp: 26   SpO2: 96% 96%     General:  EOMI mildly icteric , no pallor   Eyes: See above   ENT: Moderate dentition   Neck: Soft supple no JVD noted, carotid sinus mustache attempted   Cardiovascular: S1-S2 tachycardic regularly irregular   Respiratory: Crackles bilaterally posteriorly   Abdomen: Soft nontender nondistended   Skin: No edema   Musculoskeletal: Range of motion intact but weak   Psychiatric: Flat affect   Neurologic: 5/5 power 2/3 reflexes  Labs on Admission:  Basic Metabolic Panel:  Recent Labs Lab 02/09/14 1310  NA 129*  K 4.0  CL 75*  CO2 13*  GLUCOSE 121*  BUN 86*  CREATININE 10.70*  CALCIUM 8.9   Liver Function Tests: No results found for this basename: AST, ALT, ALKPHOS, BILITOT, PROT, ALBUMIN,  in the last 168 hours No results found for this basename: LIPASE, AMYLASE,  in the last 168 hours No results found for this basename: AMMONIA,  in the last 168 hours CBC:  Recent Labs Lab 02/09/14 1310  WBC 14.9*  NEUTROABS 13.3*  HGB 9.3*  HCT 27.1*  MCV 95.8  PLT 372   Cardiac Enzymes:  Recent Labs Lab 02/09/14 1310  TROPONINI 0.51*    BNP (last 3 results) No results found for this basename: PROBNP,  in the last 8760 hours CBG: No results found for this basename: GLUCAP,  in the last 168 hours  Radiological Exams on Admission: Dg Chest Portable 1 View  02/09/2014   CLINICAL DATA:  Acute chest pain with shortness of breath, history of hypertension and systolic heart failure as  well as peripheral vascular disease, alcohol abuse, gastric ulcers, anxiety and depression with chronic renal disease and personal history of congestive heart failure  EXAM: PORTABLE CHEST - 1 VIEW  COMPARISON:  01/14/2014  FINDINGS: Moderate cardiac enlargement. Moderate vascular congestion. Mild peribronchial cuffing and interstitial prominence. More focal hazy left suprahilar opacity. No pleural effusions.  IMPRESSION: Findings suggest mild cardiogenic interstitial pulmonary edema. Recommend radiographic follow-up to ensure resolution of left suprahilar opacity which likely represents mildly asymmetric edema.   Electronically Signed   By: Esperanza Heir M.D.   On: 02/09/2014 14:33    EKG: Independently reviewed. As above  Assessment/Plan:-  Respiratory-acute hypoxic and hypercarbic respiratory failure secondary to A.] volume overload-patient has missed dialysis on 02/07/14 and nephrology has already seen him. He will need emergent dialysis. Defer management to nephrology  B.] possible healthcare associated pneumonia vs. as ration given chronic alcoholism see below C. COPD-continue healthcare associated coverage, continue albuterol 2 puffs every 6 hourly as well as nebulization-no current wheeze therefore hold steroids-can start is it is noted that he has a wheeze. Continue Spiriva 18 mcg daily D. obstructive sleep apnea history of noncompliance. He did not want it placed on him during the emergency room stay  Infectious disease- Agree with 2 view x-ray to further delineate. Agree with empiric coverage vancomycin cefepime for now. Transitioned to clindamycin in the morning. Get a CBC plus differential in a.m. as well. Trend his lactic acid as well a Procalcitonin.  Sputum culture blood culture Legionella strep pneumo labs to be followed up  Cardiac A. Atrial flutter-2-1 block. Carotid sinus massage attempted without any change. Placed on Cardizem drip.  Potentially rate related secondary to sepsis  vs. volume overload. Get TSH. Monitor on step down unit. If does not improve we will  consult cardiology B. chest pain-unclear if he actually is having pain from ischemic pain vs. pain that is MSK. Point-of-care troponin 0.5 we will therefore trend troponin. Difficult to interpret in setting of renal disease-again this could be secondary to decompensated heart failure or tachycardia. echocardiogram 01/15/14 EF 65-70%. If wall motion abnormalities we'll consult cardiology further. At this time does not need heparin.  We will regardless given to 25 mg of aspirin C. potential decompensated heart failure with history of NICM 2012-monitor. BNP is useless in renal dysfunction. Will be getting dialysis soon. He is supposed to be on Imdur 60 mg and takes this on a when necessary-we will restart this D. uncontrolled hypertension/hypertensive urgency-has missed couple doses of his medications. For now we will hold his amlodipine 10 mg,  as well as his by mouth Cardizem 120 every 24 hourly and hydralazine 100 3 times a day. He will reimplement a slowly once Cardizem drip was in effect. We will restart his clonidine 0.2 mg twice a day   Renal/metabolic A.-Anion gap acidosis 41 Delta gap 2.1 suggesting metabolic acidosis with concurrent compensation of respiratory acidosis should clear with dialysis and treatment of pneumonia. Ethanol level is less than 11-await ingestion panel for me to alcohol, would get toxicology screen B.-end-stage renal disease [facility]-appreciate nephrology input C.-metabolic bone disease per renal  Endocrine Check HbA1c has blood sugars are slightly high  Psychiatric Continue Xanax 0.5 3 times a day when necessary anxiety.   He does not pose a threat to himself or doesn't and denies suicidal ideation He will need psychiatric input prior to discharge  GI Unclear etiology of n/v-place on Protonix po 40 bid. Monitor for further issues ordered phenergan  Neurology  No  current issue  Briefly discussed plan with Dr. Jomarie Longsroituru who agrees that patient may be in atrial flutter based on his review of EKG. We will consult if difficult to or for is he is control. Appreciate nephrology input Because he is breathing about 30 times a minute and somnolent I will ask critical care to look in on him. He is agreeable to BiPAP only after being persuaded that the other option would be intubation.   CRITICAL CARE Performed by: Rhetta MuraSAMTANI, JAI-GURMUKH   Total critical care time: 4185  Critical care time was exclusive of separately billable procedures and treating other patients.  Critical care was necessary to treat or prevent imminent or life-threatening deterioration.  Critical care was time spent personally by me on the following activities: development of treatment plan with patient and/or surrogate as well as nursing, discussions with consultants, evaluation of patient's response to treatment, examination of patient, obtaining history from patient or surrogate, ordering and performing treatments and interventions, ordering and review of laboratory studies, ordering and review of radiographic studies, pulse oximetry and re-evaluation of patient's condition.  Time spent: 85 minutes  Mahala MenghiniSAMTANI, Coffeyville Regional Medical CenterJAI-GURMUKH [facility] Pager [phone]  If 7PM-7AM, please contact night-coverage [email or web address] Password [email or web address] 02/09/2014, 3:07 PM

## 2014-02-09 NOTE — Consult Note (Signed)
I have seen and examined this patient and agree with the plan of care Patient seen on dialysis . He appears to be tolerating well  Verneal Wiers W 02/09/2014, 8:05 PM

## 2014-02-09 NOTE — ED Provider Notes (Signed)
CSN: [id number]     Arrival date & time 02/09/14  1257 History   First MD Initiated Contact with Patient 02/09/14 1307     Chief Complaint  Patient presents with  . Chest Pain  . Shortness of Breath     (Consider location/radiation/quality/duration/timing/severity/associated sxs/prior Treatment) HPI Comments: 53 year old male with history of COPD, high blood pressure uncontrolled, vascular disease, alcohol abuse, heart failure, sleep apnea, end-stage renal disease on dialysis last dialyzed on Tuesday  Presents from dialysis for chest pain and shortness of breath. Dialysis was never started. Patient recently has been drinking vodka/alcohol heavily recently since his wife died, patient made a couple nonspecific statements about suicidal ideation however is regretful for his thoughts. Patient has had nonspecific anterior chest discomfort and worsening shortness of breath since this morning. Patient has clinical intoxication so details are difficult to discern. Patient not give details about his medical history.  Patient is a 53 y.o. male presenting with chest pain and shortness of breath. The history is provided by the patient.  Chest Pain Associated symptoms: cough, fatigue and shortness of breath   Associated symptoms: no abdominal pain, no back pain, no fever, no headache and not vomiting   Shortness of Breath Associated symptoms: chest pain and cough   Associated symptoms: no abdominal pain, no fever, no headaches, no neck pain, no rash and no vomiting     Past Medical History  Diagnosis Date  . Hypertension   . Systolic heart failure     EF 40-98%  . Hyperlipidemia   . COPD (chronic obstructive pulmonary disease)   . Peripheral vascular disease   . Pneumonia 1980's  . OSA on CPAP   . ETOH abuse   . History of stomach ulcers   . Arthritis     "arms"  . Chronic lower back pain   . Anxiety   . Depression   . Adult ADHD     "never diagnosed; my son was; I think I've got it too"   . Chronic kidney disease (CKD), stage III (moderate)     baseline cr 2.3  . Renal cyst     left, complex  . Renal artery stenosis     a. s/p BMS to inferior branch of right renal artery 12/2011. On left, 3 renal arteries were noted, 2 of them were very small in size and subtotally occluded  . Anginal pain   . CHF (congestive heart failure)   . Seasonal allergies   . Hepatitis     "I think it was C"   Past Surgical History  Procedure Laterality Date  . Renal artery stent  01/05/2012    right inferior  . No past surgeries    . Av fistula placement Left 10/10/2012    Procedure: ARTERIOVENOUS (AV) FISTULA CREATION;  Surgeon: Sherren Kerns, MD;  Location: [facility] OR;  Service: Vascular;  Laterality: Left;  Brachio-cephalic   Family History  Problem Relation Age of Onset  . Brain cancer Mother   . Pancreatic cancer Father   . Heart failure Paternal Uncle   . Heart disease Paternal Uncle   . Heart failure Paternal Grandfather   . Heart disease Paternal Grandfather    History  Substance Use Topics  . Smoking status: Current Every Day Smoker -- 0.20 packs/day for 20 years    Types: Cigarettes    Last Attempt to Quit: 12/08/2012  . Smokeless tobacco: Current User    Types: Snuff, Chew  . Alcohol Use: Yes  Comment: Pt states he usually has 2 drinks per day.    Review of Systems  Constitutional: Positive for appetite change and fatigue. Negative for fever and chills.  HENT: Negative for congestion.   Eyes: Negative for visual disturbance.  Respiratory: Positive for cough and shortness of breath.   Cardiovascular: Positive for chest pain.  Gastrointestinal: Negative for vomiting and abdominal pain.  Genitourinary: Negative for dysuria and flank pain.  Musculoskeletal: Negative for back pain, neck pain and neck stiffness.  Skin: Negative for rash.  Neurological: Negative for light-headedness and headaches.  Psychiatric/Behavioral: Positive for dysphoric mood.      Allergies   Review of patient's allergies indicates no known allergies.  Home Medications   Prior to Admission medications   Medication Sig Start Date End Date Taking? Authorizing Provider  acetaminophen (TYLENOL) 500 MG tablet Take 1,000 mg by mouth every 6 (six) hours as needed (pain).     Historical Provider, MD  albuterol (PROVENTIL HFA;VENTOLIN HFA) 108 (90 BASE) MCG/ACT inhaler Inhale 2 puffs into the lungs every 6 (six) hours as needed for wheezing or shortness of breath.    Historical Provider, MD  albuterol (PROVENTIL HFA;VENTOLIN HFA) 108 (90 BASE) MCG/ACT inhaler Inhale 2 puffs into the lungs every 6 (six) hours as needed for wheezing or shortness of breath. 01/17/14   Zannie Cove, MD  albuterol (PROVENTIL) (2.5 MG/3ML) 0.083% nebulizer solution Take 3 mLs (2.5 mg total) by nebulization every 6 (six) hours as needed. For wheezing. 02/15/13   Coralyn Helling, MD  ALPRAZolam Prudy Feeler) 0.5 MG tablet Take 1 tablet (0.5 mg total) by mouth 3 (three) times daily as needed for anxiety. 01/17/14   Zannie Cove, MD  amLODipine (NORVASC) 10 MG tablet Take 10 mg by mouth at bedtime.    Historical Provider, MD  budesonide-formoterol (SYMBICORT) 80-4.5 MCG/ACT inhaler Inhale 2 puffs into the lungs 2 (two) times daily. 01/17/14   Zannie Cove, MD  calcium acetate (PHOSLO) 667 MG capsule Take 667 mg by mouth daily with supper.    Historical Provider, MD  calcium carbonate (TUMS EX) 750 MG chewable tablet Chew 1 tablet by mouth 2 (two) times daily as needed for heartburn.     Historical Provider, MD  cloNIDine (CATAPRES) 0.2 MG tablet Take 0.2 mg by mouth 2 (two) times daily.    Historical Provider, MD  diltiazem (CARDIZEM CD) 120 MG 24 hr capsule Take 120 mg by mouth at bedtime.     Historical Provider, MD  diphenhydrAMINE (BENADRYL) 25 MG tablet Take 25 mg by mouth every 6 (six) hours as needed for itching.     Historical Provider, MD  hydrALAZINE (APRESOLINE) 100 MG tablet Take 100 mg by mouth 3 (three) times  daily.    Historical Provider, MD  isosorbide mononitrate (IMDUR) 60 MG 24 hr tablet Take 60 mg by mouth daily as needed (blood pressure close to 200).    Historical Provider, MD  Multiple Vitamin (MULTIVITAMIN WITH MINERALS) TABS tablet Take 1 tablet by mouth daily.    Historical Provider, MD  predniSONE (DELTASONE) 20 MG tablet Take 40mg  for 2days then 20mg  for 2days then STOP 01/17/14   Zannie Cove, MD  roflumilast (DALIRESP) 500 MCG TABS tablet Take 500 mcg by mouth daily.    Historical Provider, MD  tiotropium (SPIRIVA) 18 MCG inhalation capsule Place 18 mcg into inhaler and inhale daily.    Historical Provider, MD  Valerian Root 500 MG CAPS Take 500 mg by mouth daily as needed (anxiety).  Historical Provider, MD  vitamin C (ASCORBIC ACID) 500 MG tablet Take 500 mg by mouth daily.    Historical Provider, MD   BP 162/89  Pulse 136  Temp(Src) 98.1 F (36.7 C) (Oral)  Resp 19  SpO2 97% Physical Exam  Nursing note and vitals reviewed. Constitutional: He appears well-developed and well-nourished.  HENT:  Head: Normocephalic and atraumatic.  Eyes: Conjunctivae are normal. Right eye exhibits no discharge. Left eye exhibits no discharge.  Neck: Normal range of motion. Neck supple. No tracheal deviation present.  Cardiovascular: Regular rhythm.  Tachycardia present.   Pulmonary/Chest: He is in respiratory distress (tachypnea). He has no wheezes. He has rales (few rales at bases bilateral).  Abdominal: Soft. He exhibits no distension. There is no tenderness. There is no guarding.  Musculoskeletal: He exhibits edema (mild bilateral LE).  Neurological: He is alert. GCS eye subscore is 3. GCS verbal subscore is 4. GCS motor subscore is 6.  Patient has mild somnolence however is arousable to loud verbal and will answer most of your questions before falling back asleep. . Patient moves all extremities with general weakness.  Skin: Skin is warm. No rash noted.  Psychiatric:  Mild lethargy     ED Course  Procedures (including critical care time) CRITICAL CARE Performed by: Enid Skeens   Total critical care time: 45 min  Critical care time was exclusive of separately billable procedures and treating other patients.  Critical care was necessary to treat or prevent imminent or life-threatening deterioration.  Critical care was time spent personally by me on the following activities: development of treatment plan with patient and/or surrogate as well as nursing, discussions with consultants, evaluation of patient's response to treatment, examination of patient, obtaining history from patient or surrogate, ordering and performing treatments and interventions, ordering and review of laboratory studies, ordering and review of radiographic studies, pulse oximetry and re-evaluation of patient's condition.  Labs Review Labs Reviewed  BASIC METABOLIC PANEL - Abnormal; Notable for the following:    Sodium 129 (*)    Chloride 75 (*)    CO2 13 (*)    Glucose, Bld 121 (*)    BUN 86 (*)    Creatinine, Ser 10.70 (*)    GFR calc non Af Amer 5 (*)    GFR calc Af Amer 6 (*)    Anion gap 41 (*)    All other components within normal limits  CBC WITH DIFFERENTIAL - Abnormal; Notable for the following:    WBC 14.9 (*)    RBC 2.83 (*)    Hemoglobin 9.3 (*)    HCT 27.1 (*)    Neutrophils Relative % 89 (*)    Lymphocytes Relative 5 (*)    Neutro Abs 13.3 (*)    All other components within normal limits  TROPONIN I - Abnormal; Notable for the following:    Troponin I 0.51 (*)    All other components within normal limits  HEPATIC FUNCTION PANEL - Abnormal; Notable for the following:    Albumin 3.3 (*)    AST 39 (*)    All other components within normal limits  PRO B NATRIURETIC PEPTIDE - Abnormal; Notable for the following:    Pro B Natriuretic peptide (BNP) >70000.0 (*)    All other components within normal limits  I-STAT ARTERIAL BLOOD GAS, ED - Abnormal; Notable for the  following:    pCO2 arterial 28.8 (*)    pO2, Arterial 77.0 (*)    Bicarbonate 16.5 (*)  Acid-base deficit 8.0 (*)    All other components within normal limits  CULTURE, BLOOD (ROUTINE X 2)  CULTURE, BLOOD (ROUTINE X 2)  AMMONIA  ETHANOL  LACTIC ACID, PLASMA  BLOOD GAS, ARTERIAL  OSMOLALITY  ALCOHOL, METHYL (METHANOL), BLOOD  PROCALCITONIN  HEMOGLOBIN A1C  I-STAT CG4 LACTIC ACID, ED    Imaging Review Dg Chest Portable 1 View  02/09/2014   CLINICAL DATA:  Acute chest pain with shortness of breath, history of hypertension and systolic heart failure as well as peripheral vascular disease, alcohol abuse, gastric ulcers, anxiety and depression with chronic renal disease and personal history of congestive heart failure  EXAM: PORTABLE CHEST - 1 VIEW  COMPARISON:  01/14/2014  FINDINGS: Moderate cardiac enlargement. Moderate vascular congestion. Mild peribronchial cuffing and interstitial prominence. More focal hazy left suprahilar opacity. No pleural effusions.  IMPRESSION: Findings suggest mild cardiogenic interstitial pulmonary edema. Recommend radiographic follow-up to ensure resolution of left suprahilar opacity which likely represents mildly asymmetric edema.   Electronically Signed   By: Esperanza Heir M.D.   On: 02/09/2014 14:33     EKG Interpretation None     Initial EKG reviewed heart rate 113, sinus tachycardia, PVCs, prolonged QT Repeat EKG for worsening tachycardia,mild ST depression in lateral leads, tachycardia. Prolonged QT anteriorly.  heart rate 132, and regular, likely atrial flutter   Repeat EKG done For worsening tachycardia, heart rate 149, regular, old Q waves anteriorly, prolonged QT, concern for atrial tachycardia versus sinus tachycardia versus atrial flutter.   MDM   Final diagnoses:  Acute respiratory failure with hypoxia  Acute pulmonary edema  ESRD (end stage renal disease) on dialysis  Chest pain, unspecified chest pain type  Elevated troponin I level   Metabolic acidosis, increased anion gap  Altered mental status, unspecified altered mental status type   Patient presents mild lethargy, poor historian, with respiratory difficulty and chest pain nonspecific. Nephrology evaluated in ER, potassium normal however patient has significant metabolic acidosis likely a combination of renal and alcohol related however at this time unable to rule out other ingestion/methanol etc. Dialysis would treat a lot of those ingestions you know patient clinically intoxicated and denies at this time. Patient developed worsening tachycardia likely component of atrial flutter. Cardizem drip and bolus ordered. Discussed case with nephrology and try hospitalist for stepped-down admission. Plan for hospital-acquired antibiotics treatment, Cardizem drip, second IV placed in a patient dialysis and step-down is likely plan.  Cardizem bolus given, medicine as to hold Cardizem drip, heart rate did not improve, Cardizem drip started. Patient had mild improvement in the ER. The patients results and plan were reviewed and discussed.   Any x-rays performed were personally reviewed by myself.   Differential diagnosis were considered with the presenting HPI.  Medications  diltiazem (CARDIZEM) 1 mg/mL load via infusion 15 mg (15 mg Intravenous Not Given 02/09/14 1553)    And  diltiazem (CARDIZEM) 100 mg in dextrose 5 % 100 mL (1 mg/mL) infusion (10 mg/hr Intravenous Rate/Dose Change 02/09/14 1623)  vancomycin (VANCOCIN) IVPB 1000 mg/200 mL premix (not administered)  ceFEPIme (MAXIPIME) 1 g in dextrose 5 % 50 mL IVPB (not administered)  ferric gluconate (NULECIT) 125 mg in sodium chloride 0.9 % 100 mL IVPB (not administered)  diltiazem (CARDIZEM) injection 5 mg (5 mg Intravenous Not Given 02/09/14 1534)  ceFEPIme (MAXIPIME) 1 g in dextrose 5 % 50 mL IVPB (1 g Intravenous New Bag/Given 02/09/14 1525)    Filed Vitals:   02/09/14 1330 02/09/14 1400 02/09/14  1430 02/09/14 1500  BP:  155/86 160/91 146/91 162/89  Pulse: 109 108 124 136  Temp:      TempSrc:      Resp: 25 25 24 19   SpO2: 92% 93% 95% 97%    Final diagnoses:  Acute respiratory failure with hypoxia  Acute pulmonary edema  ESRD (end stage renal disease) on dialysis  Chest pain, unspecified chest pain type  Elevated troponin I level  Metabolic acidosis, increased anion gap  Altered mental status, unspecified altered mental status type    Admission/ observation were discussed with the admitting physician, patient and/or family and they are comfortable with the plan.      Enid SkeensJoshua M Zahli Vetsch, MD 02/09/14 [phone]

## 2014-02-09 NOTE — Significant Event (Addendum)
Rapid Response Event Note Called per HD RN for pt with 10/10 chest pain. Nephro MD requesting to [facility] unable to [facility] due to active chest pain.  Overview: Time Called: 2120 Arrival Time: 2122 Event Type: Cardiac  Initial Focused Assessment: Pt found restring in bed, alert oriented, follows commands. Complains of severe chest pain 10/10. HD just completed, off machine now, 3.5 L removed. Bipap removed pta, now 02 sats 100% on 6 LNC.  See VS in flowsheet  Interventions: Pt given 1 NTG tab at 2128, Daphane ShepherdM Lynch NP at bedside, 2 mg morphine given IVP at 2130. EKG obtained yielding ST with PACs, no acute EKG changes. As of 2145 pt admits to chest pain 1/10, and resolving. [facility] called for report per [facility] RN. [facility] at 2200. Left with [facility] RN, denies chest pain or SOB. Daphane ShepherdM Lynch NP to see patient later tonight. RN advised to call for further concerns.  Event Summary: Name of Physician Notified: Daphane ShepherdM Lynch NP at 2120    at    Outcome: Transferred (Comment) (Transfered to [facility])     [place], James IvanoffBrooke Leigh Josilynn Losh

## 2014-02-09 NOTE — Consult Note (Signed)
[facility] Renal Consultation Note  Indication for Consultation:  Management of ESRD/hemodialysis; anemia, hypertension/volume and secondary hyperparathyroidism  HPI: [name] is a 53 y.o. male with a history of COPD, hypertension, alcohol abuse, depression, and ESRD on dialysis at the [facility] who missed his scheduled dialysis on Thursday during an alcohol binge in which he drank a gallon of vodka to kill himself, but later regretted his decision and attended his dialysis session today, was deemed too unstable to start, and was transferred to the hospital.  He has shortness of breath and chest tightness and has had nausea and vomiting over the last day.  He admits that he has been depressed since his wife died a few years ago and occasionally drinks heavily.  He will be admitted and receive emergent dialysis.  Dialysis Orders:   TTS @ [facility] 66.5 kg     4 hrs     3K/2.25Ca       400/A1.5       Heparin 3000 U      AVF @ [facility] No Hectorol         Aranesp 25 mcg on Tues.       Venofer 100 mg x 10 (through 10/20)  Past Medical History  Diagnosis Date  . Hypertension   . Systolic heart failure     EF 25-30%  . Hyperlipidemia   . COPD (chronic obstructive pulmonary disease)   . Peripheral vascular disease   . Pneumonia 1980's  . OSA on CPAP   . ETOH abuse   . History of stomach ulcers   . Arthritis     "arms"  . Chronic lower back pain   . Anxiety   . Depression   . Adult ADHD     "never diagnosed; my son was; I think I've got it too"  . Chronic kidney disease (CKD), stage III (moderate)     baseline cr 2.3  . Renal cyst     left, complex  . Renal artery stenosis     a. s/p BMS to inferior branch of right renal artery 12/2011. On left, 3 renal arteries were noted, 2 of them were very small in size and subtotally occluded  . Anginal pain   . CHF (congestive heart failure)   . Seasonal allergies   . Hepatitis     "I think it was C"   Past Surgical  History  Procedure Laterality Date  . Renal artery stent  01/05/2012    right inferior  . No past surgeries    . Av fistula placement Left 10/10/2012    Procedure: ARTERIOVENOUS (AV) FISTULA CREATION;  Surgeon: Elam Dutch, MD;  Location: [facility] OR;  Service: Vascular;  Laterality: Left;  Brachio-cephalic   Family History  Problem Relation Age of Onset  . Brain cancer Mother   . Pancreatic cancer Father   . Heart failure Paternal Uncle   . Heart disease Paternal Uncle   . Heart failure Paternal Grandfather   . Heart disease Paternal Grandfather    Social History  He has a 4 pack-year smoking history, but has recently cut back from 2 packs a day to . His smokeless tobacco use includes snuff and chew. He drinks alcohol, occasionally heavy, but does not use illicit drugs.  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  He has lived alone since his wife died.  No Known Allergies Prior to Admission medications  Medication Sig Start Date End Date Taking? Authorizing Provider  acetaminophen (TYLENOL) 500 MG tablet Take 1,000 mg by mouth every 6 (six) hours as needed (pain).     Historical Provider, MD  albuterol (PROVENTIL HFA;VENTOLIN HFA) 108 (90 BASE) MCG/ACT inhaler Inhale 2 puffs into the lungs every 6 (six) hours as needed for wheezing or shortness of breath.    Historical Provider, MD  albuterol (PROVENTIL HFA;VENTOLIN HFA) 108 (90 BASE) MCG/ACT inhaler Inhale 2 puffs into the lungs every 6 (six) hours as needed for wheezing or shortness of breath. 01/17/14   Domenic Polite, MD  albuterol (PROVENTIL) (2.5 MG/3ML) 0.083% nebulizer solution Take 3 mLs (2.5 mg total) by nebulization every 6 (six) hours as needed. For wheezing. 02/15/13   Chesley Mires, MD  ALPRAZolam Duanne Moron) 0.5 MG tablet Take 1 tablet (0.5 mg total) by mouth 3 (three) times daily as needed for anxiety. 01/17/14   Domenic Polite, MD  amLODipine (NORVASC) 10 MG tablet  Take 10 mg by mouth at bedtime.    Historical Provider, MD  budesonide-formoterol (SYMBICORT) 80-4.5 MCG/ACT inhaler Inhale 2 puffs into the lungs 2 (two) times daily. 01/17/14   Domenic Polite, MD  calcium acetate (PHOSLO) 667 MG capsule Take 667 mg by mouth daily with supper.    Historical Provider, MD  calcium carbonate (TUMS EX) 750 MG chewable tablet Chew 1 tablet by mouth 2 (two) times daily as needed for heartburn.     Historical Provider, MD  cloNIDine (CATAPRES) 0.2 MG tablet Take 0.2 mg by mouth 2 (two) times daily.    Historical Provider, MD  diltiazem (CARDIZEM CD) 120 MG 24 hr capsule Take 120 mg by mouth at bedtime.     Historical Provider, MD  diphenhydrAMINE (BENADRYL) 25 MG tablet Take 25 mg by mouth every 6 (six) hours as needed for itching.     Historical Provider, MD  hydrALAZINE (APRESOLINE) 100 MG tablet Take 100 mg by mouth 3 (three) times daily.    Historical Provider, MD  isosorbide mononitrate (IMDUR) 60 MG 24 hr tablet Take 60 mg by mouth daily as needed (blood pressure close to 200).    Historical Provider, MD  Multiple Vitamin (MULTIVITAMIN WITH MINERALS) TABS tablet Take 1 tablet by mouth daily.    Historical Provider, MD  predniSONE (DELTASONE) 20 MG tablet Take 15m for 2days then 2102mfor 2days then STOP 01/17/14   PrDomenic PoliteMD  roflumilast (DALIRESP) 500 MCG TABS tablet Take 500 mcg by mouth daily.    Historical Provider, MD  tiotropium (SPIRIVA) 18 MCG inhalation capsule Place 18 mcg into inhaler and inhale daily.    Historical Provider, MD  Valerian Root 500 MG CAPS Take 500 mg by mouth daily as needed (anxiety).     Historical Provider, MD  vitamin C (ASCORBIC ACID) 500 MG tablet Take 500 mg by mouth daily.    Historical Provider, MD   Labs:  Results for orders placed during the hospital encounter of 02/09/14 (from the past 48 hour(s))  BASIC METABOLIC PANEL     Status: Abnormal   Collection Time    02/09/14  1:10 PM      Result Value Ref Range   Sodium  129 (*) 137 - 147 mEq/L   Potassium 4.0  3.7 - 5.3 mEq/L   Chloride 75 (*) 96 - 112 mEq/L   CO2 13 (*) 19 - 32 mEq/L   Glucose, Bld 121 (*) 70 - 99 mg/dL   BUN 86 (*) 6 - 23 mg/dL  Creatinine, Ser 10.70 (*) 0.50 - 1.35 mg/dL   Calcium 8.9  8.4 - 10.5 mg/dL   GFR calc non Af Amer 5 (*) >90 mL/min   GFR calc Af Amer 6 (*) >90 mL/min   Comment: (NOTE)     The eGFR has been calculated using the CKD EPI equation.     This calculation has not been validated in all clinical situations.     eGFR's persistently <90 mL/min signify possible Chronic Kidney     Disease.   Anion gap 41 (*) 5 - 15  CBC WITH DIFFERENTIAL     Status: Abnormal   Collection Time    02/09/14  1:10 PM      Result Value Ref Range   WBC 14.9 (*) 4.0 - 10.5 K/uL   RBC 2.83 (*) 4.22 - 5.81 MIL/uL   Hemoglobin 9.3 (*) 13.0 - 17.0 g/dL   HCT 27.1 (*) 39.0 - 52.0 %   MCV 95.8  78.0 - 100.0 fL   MCH 32.9  26.0 - 34.0 pg   MCHC 34.3  30.0 - 36.0 g/dL   RDW 14.5  11.5 - 15.5 %   Platelets 372  150 - 400 K/uL   Neutrophils Relative % 89 (*) 43 - 77 %   Lymphocytes Relative 5 (*) 12 - 46 %   Monocytes Relative 6  3 - 12 %   Eosinophils Relative 0  0 - 5 %   Basophils Relative 0  0 - 1 %   Neutro Abs 13.3 (*) 1.7 - 7.7 K/uL   Lymphs Abs 0.7  0.7 - 4.0 K/uL   Monocytes Absolute 0.9  0.1 - 1.0 K/uL   Eosinophils Absolute 0.0  0.0 - 0.7 K/uL   Basophils Absolute 0.0  0.0 - 0.1 K/uL   Smear Review MORPHOLOGY UNREMARKABLE    TROPONIN I     Status: Abnormal   Collection Time    02/09/14  1:10 PM      Result Value Ref Range   Troponin I 0.51 (*) <0.30 ng/mL   Comment:            Due to the release kinetics of cTnI,     a negative result within the first hours     of the onset of symptoms does not rule out     myocardial infarction with certainty.     If myocardial infarction is still suspected,     repeat the test at appropriate intervals.     CRITICAL RESULT CALLED TO, READ BACK BY AND VERIFIED WITH:     TOWNSEND B.,RN  10.03.15 1443 BY JONESJ  PRO B NATRIURETIC PEPTIDE     Status: Abnormal   Collection Time    02/09/14  2:25 PM      Result Value Ref Range   Pro B Natriuretic peptide (BNP) >70000.0 (*) 0 - 125 pg/mL  I-STAT ARTERIAL BLOOD GAS, ED     Status: Abnormal   Collection Time    02/09/14  2:27 PM      Result Value Ref Range   pH, Arterial 7.367  7.350 - 7.450   pCO2 arterial 28.8 (*) 35.0 - 45.0 mmHg   pO2, Arterial 77.0 (*) 80.0 - 100.0 mmHg   Bicarbonate 16.5 (*) 20.0 - 24.0 mEq/L   TCO2 17  0 - 100 mmol/L   O2 Saturation 95.0     Acid-base deficit 8.0 (*) 0.0 - 2.0 mmol/L   Patient temperature 37.0 C  Collection site RADIAL, ALLEN'S TEST ACCEPTABLE     Drawn by VENIPUNCTURE     Sample type ARTERIAL    HEPATIC FUNCTION PANEL     Status: Abnormal   Collection Time    02/09/14  2:36 PM      Result Value Ref Range   Total Protein 7.8  6.0 - 8.3 g/dL   Albumin 3.3 (*) 3.5 - 5.2 g/dL   AST 39 (*) 0 - 37 U/L   ALT 22  0 - 53 U/L   Alkaline Phosphatase 112  39 - 117 U/L   Total Bilirubin 0.3  0.3 - 1.2 mg/dL   Bilirubin, Direct <0.2  0.0 - 0.3 mg/dL   Indirect Bilirubin NOT CALCULATED  0.3 - 0.9 mg/dL  ETHANOL     Status: None   Collection Time    02/09/14  2:36 PM      Result Value Ref Range   Alcohol, Ethyl (B) <11  0 - 11 mg/dL   Comment:            LOWEST DETECTABLE LIMIT FOR     SERUM ALCOHOL IS 11 mg/dL     FOR MEDICAL PURPOSES ONLY   Constitutional: negative for chills, fatigue, fevers and sweats Ears, nose, mouth, throat, and face: negative for earaches, hoarseness, nasal congestion and sore throat Respiratory: positive for cough and dyspnea on exertion, negative for hemoptysis and sputum Cardiovascular: positive for chest pressure/discomfort, dyspnea and orthopnea, negative for chest pain and palpitations Gastrointestinal: positive for nausea and vomiting, negative for abdominal pain and change in bowel habits Genitourinary:negative, anuric Musculoskeletal:negative  for arthralgias, back pain, myalgias and neck pain Neurological: negative for dizziness, gait problems, headaches, paresthesia and speech problems  Physical Exam: Filed Vitals:   02/09/14 1500  BP: 162/89  Pulse: 136  Temp:   Resp: 19     General appearance: alert, cooperative and moderate distress Head: Normocephalic, without obvious abnormality, atraumatic Neck: no adenopathy, no carotid bruit, no JVD and supple, symmetrical, trachea midline Resp: scattered rales bilaterally Cardio: Tachycardic without murmur or rub GI: soft, non-tender; bowel sounds normal; no masses,  no organomegaly Extremities: extremities normal, atraumatic, no cyanosis or edema Neurologic: Grossly normal Dialysis Access: AVF @ [facility] with + bruit   Assessment/Plan: 1. Acute respiratory failure - sec volume overload per chest x-ray, metabolic acidosis, missed HD and alcohol intoxication; nebulizer, O2 per Camilla, also started Vancomycin & Cefepime for ? PNA.  HD pending. 2. Atrial flutter - per EKG, started Cardizem drip. 3. ESRD - HD on TTS @ [facility], K 4.  HD pending. 4. Hypertension/volume - BP 154/84 now on Cardizem drip; Amlodipine, Clonidine, Diltiazem, & Hydralazine as outpatient; below EDW, but CXR indicates edema. 5. Anemia - Hgb 9.3, Aranesp 25 mcg on Tues, Venofer 100 mg x 10. 6. Metabolic bone disease - Ca 8.9 (9.5 corrected), last P 3.4, iPTH 87; no Hectorol, Phoslo with meals. 7. Nutrition - Alb 3.3, renal diet, multivitamin. 8. Alcohol abuse 9. Depression - with recent suicidal ideation. 10. COPD - tobacco abuse, but down to 2 cigarettes/day. 11. ASCVD - Hx NSTEMI, R renal artery stenosis w/ stent. 12. OSA - noncompliant with CPAP.  Leonell Lobdell 02/09/2014, 3:46 PM   Attending Nephrologist: Edrick Oh, MD

## 2014-02-09 NOTE — ED Notes (Signed)
Nephrology at bedside

## 2014-02-09 NOTE — Progress Notes (Signed)
ANTIBIOTIC CONSULT NOTE - INITIAL  Pharmacy Consult for Vancomycin Indication: pneumonia  No Known Allergies  Patient Measurements: Weight = 65 kg  Vital Signs: Temp: 98.1 F (36.7 C) (10/03 1309) Temp Source: Oral (10/03 1309) BP: 157/86 mmHg (10/03 1309) Pulse Rate: 113 (10/03 1309)  Labs:  Recent Labs  02/09/14 1310  WBC 14.9*  HGB 9.3*  PLT 372  CREATININE 10.70*   The CrCl is unknown because both a height and weight (above a minimum accepted value) are required for this calculation. No results found for this basename: VANCOTROUGH, Leodis BinetVANCOPEAK, VANCORANDOM, GENTTROUGH, GENTPEAK, GENTRANDOM, TOBRATROUGH, TOBRAPEAK, TOBRARND, AMIKACINPEAK, AMIKACINTROU, AMIKACIN,  in the last 72 hours   Microbiology: Recent Results (from the past 720 hour(s))  MRSA PCR SCREENING     Status: None   Collection Time    01/14/14  4:44 PM      Result Value Ref Range Status   MRSA by PCR NEGATIVE  NEGATIVE Final   Comment:            The GeneXpert MRSA Assay (FDA     approved for NASAL specimens     only), is one component of a     comprehensive MRSA colonization     surveillance program. It is not     intended to diagnose MRSA     infection nor to guide or     monitor treatment for     MRSA infections.    Medical History: Past Medical History  Diagnosis Date  . Hypertension   . Systolic heart failure     EF 16-10%25-30%  . Hyperlipidemia   . COPD (chronic obstructive pulmonary disease)   . Peripheral vascular disease   . Pneumonia 1980's  . OSA on CPAP   . ETOH abuse   . History of stomach ulcers   . Arthritis     "arms"  . Chronic lower back pain   . Anxiety   . Depression   . Adult ADHD     "never diagnosed; my son was; I think I've got it too"  . Chronic kidney disease (CKD), stage III (moderate)     baseline cr 2.3  . Renal cyst     left, complex  . Renal artery stenosis     a. s/p BMS to inferior branch of right renal artery 12/2011. On left, 3 renal arteries were  noted, 2 of them were very small in size and subtotally occluded  . Anginal pain   . CHF (congestive heart failure)   . Seasonal allergies   . Hepatitis     "I think it was C"    Assessment: 53 year old male who came from HD with chest pain and shortness of breath  Beginning Cefepime and Vancomycin for possible pneumonia  Goal of Therapy:  Appropriate dosing  Plan:  1) Vancomycin 1 gram iv x 1 dose now then 500 mg iv Q HD once HD schedule known 2) Cefepime 1 Gram iv Q 24 hours 3) Follow up fever trend, progress, cultures  Thank you. Okey RegalLisa Louetta Hollingshead, PharmD [phone]  Elwin SleightPowell, Karema Tocci Kay 02/09/2014,2:53 PM

## 2014-02-09 NOTE — Progress Notes (Signed)
Subjective/Objective Called by Hemodialysis -patient having 10/10 chest pain - ordered Ntg 0.4 sublingual. On evaluation patient reports some decrease in chest pain but still present. He described the pain as sharp, that is mid chest extending to his back. Denies radiation to neck or left arm.Given Morphine 2 mg IV with almost total resolution in pain (less than 1 on 1-10 scale.   Physical exam: General : alert, oriented, initially appeared uncomfortable, no calm, no distress Lungs: Tachypnea, bilateral rales, on 6L [place] (previously on Bipap) Heart: tachycardic, EKG - sinus rhythm with PAC's   Scheduled Meds: . [START ON 02/12/2014] ferric gluconate (FERRLECIT/NULECIT) IV  125 mg Intravenous Q T,Th,Sa-HD  . heparin  3,000 Units Dialysis Once in dialysis  . morphine       Continuous Infusions: . sodium chloride    . sodium chloride    . [START ON 02/10/2014] ceFEPime (MAXIPIME) IV    . diltiazem     And  . diltiazem (CARDIZEM) infusion 10 mg/hr (02/09/14 1623)  . diltiazem     PRN Meds:sodium chloride, sodium chloride, alteplase, feeding supplement (NEPRO CARB STEADY), heparin, lidocaine (PF), lidocaine-prilocaine, nitroGLYCERIN, pentafluoroprop-tetrafluoroeth  Vital signs in last 24 hours: Temp:  [97 F (36.1 C)-98.8 F (37.1 C)] 98.8 F (37.1 C) (10/03 2104) Pulse Rate:  [103-154] 110 (10/03 2141) Resp:  [18-26] 26 (10/03 2141) BP: (118-170)/(79-106) 132/81 mmHg (10/03 2141) SpO2:  [92 %-100 %] 100 % (10/03 2141)  Intake/Output last 3 shifts:   Intake/Output this shift: Total I/O In: -  Out: 3500 [Other:3500]  Problem Assessment/Plan  Chest pain Relieved by Ntg 0.4 and Morphine 2 mg IV x 1. 1st troponin 0.51. Continue serial troponin (ordered). Transferred to 3S in stable condition. Continue to monitor  Dyspnea Improved post dialysis (3.5L removed). No on 6L [place] (previously on Bipap).

## 2014-02-09 NOTE — ED Notes (Signed)
Resp tech at bedside, pt refused bipap.

## 2014-02-09 NOTE — ED Notes (Signed)
Pt O2 saturation at 90% on 2L [place], incrased to 3L [place] and O2 saturation level now 96%.

## 2014-02-09 NOTE — ED Notes (Addendum)
[facility] came [facility], began having chest pain and shortness of breath. Dialysis was never started, last treatment was 9/29. Pt was 93% on 2L [place] and improved to 96% after duoneb treatment. Pt given 3 nitro tablets in route. Slightly hypertensive and rates pain 8/10 on arrival. Pt also states his wife recently passed away and he has been drinking more frequently, he states "I drank over half a gallon of liquor on Thursday with intention to harm myself." Pt is denying SI and HI at this time and seems regretful about his actions Thursday.

## 2014-02-09 NOTE — ED Notes (Signed)
Attempted to call report to 3S 

## 2014-02-09 NOTE — Consult Note (Signed)
PULMONARY / CRITICAL CARE MEDICINE   Name: [name] MRN: [id number] DOB: 01/16/1961    ADMISSION DATE:  02/09/2014 CONSULTATION DATE:  02/09/2014  REFERRING MD :  Mahala MenghiniSamtani  CHIEF COMPLAINT:  Short of breath  INITIAL PRESENTATION:  53 yo male smoker with chest pain and dyspnea from [place] [facility].  Noted to have a flutter, and interstitial pulmonary edema.  PCCM asked to assist with respiratory management.  STUDIES:  PFT 04/09/11 >> FEV1 2.10 (57%), FEV1% 69, TLC 7.37 (112%), DLCO 61%, no BD response  PSG 07/07/11 >> AHI 38.2, SpO2 low 87%, CPAP 7 cm H2O  Echo 01/15/14 >> EF 65 to 70%, mild MR  SIGNIFICANT EVENTS: 10/03 Admit, emergent HD  HISTORY OF PRESENT ILLNESS:   53 yo male was at HD center in [place].  He missed scheduled HD on 10/01.  C/o chest pain and dyspnea.  Never started HD, and sent to ER.  He was given NTG and neb tx with some improvement.  Wife stated that he drank half gallon of liquor on 10/01 with intent to harm himself.  He initially refused BiPAP, but later agreed.  He was seen by nephrology and emergent HD arranged.  He was started on cardizem for a flutter.  Since starting dialysis he feels his breathing is improving.  He c/o back and leg pains while sitting in bed.  He currently denies chest or abdominal pain.  PAST MEDICAL HISTORY :   has a past medical history of Hypertension; Systolic heart failure; Hyperlipidemia; COPD (chronic obstructive pulmonary disease); Peripheral vascular disease; Pneumonia (1980's); OSA on CPAP; ETOH abuse; History of stomach ulcers; Arthritis; Chronic lower back pain; Anxiety; Depression; Adult ADHD; Chronic kidney disease (CKD), stage III (moderate); Renal cyst; Renal artery stenosis; Anginal pain; CHF (congestive heart failure); Seasonal allergies; and Hepatitis.  has past surgical history that includes Renal artery stent (01/05/2012); No past surgeries; and AV fistula placement (Left, 10/10/2012). Prior to Admission  medications   Medication Sig Start Date End Date Taking? Authorizing Provider  acetaminophen (TYLENOL) 500 MG tablet Take 1,000 mg by mouth every 6 (six) hours as needed (pain).     Historical Provider, MD  albuterol (PROVENTIL HFA;VENTOLIN HFA) 108 (90 BASE) MCG/ACT inhaler Inhale 2 puffs into the lungs every 6 (six) hours as needed for wheezing or shortness of breath.    Historical Provider, MD  albuterol (PROVENTIL HFA;VENTOLIN HFA) 108 (90 BASE) MCG/ACT inhaler Inhale 2 puffs into the lungs every 6 (six) hours as needed for wheezing or shortness of breath. 01/17/14   Zannie CovePreetha Joseph, MD  albuterol (PROVENTIL) (2.5 MG/3ML) 0.083% nebulizer solution Take 3 mLs (2.5 mg total) by nebulization every 6 (six) hours as needed. For wheezing. 02/15/13   Coralyn HellingVineet Tyus Kallam, MD  ALPRAZolam Prudy Feeler(XANAX) 0.5 MG tablet Take 1 tablet (0.5 mg total) by mouth 3 (three) times daily as needed for anxiety. 01/17/14   Zannie CovePreetha Joseph, MD  amLODipine (NORVASC) 10 MG tablet Take 10 mg by mouth at bedtime.    Historical Provider, MD  budesonide-formoterol (SYMBICORT) 80-4.5 MCG/ACT inhaler Inhale 2 puffs into the lungs 2 (two) times daily. 01/17/14   Zannie CovePreetha Joseph, MD  calcium acetate (PHOSLO) 667 MG capsule Take 667 mg by mouth daily with supper.    Historical Provider, MD  calcium carbonate (TUMS EX) 750 MG chewable tablet Chew 1 tablet by mouth 2 (two) times daily as needed for heartburn.     Historical Provider, MD  cloNIDine (CATAPRES) 0.2 MG tablet Take 0.2 mg by  mouth 2 (two) times daily.    Historical Provider, MD  diltiazem (CARDIZEM CD) 120 MG 24 hr capsule Take 120 mg by mouth at bedtime.     Historical Provider, MD  diphenhydrAMINE (BENADRYL) 25 MG tablet Take 25 mg by mouth every 6 (six) hours as needed for itching.     Historical Provider, MD  hydrALAZINE (APRESOLINE) 100 MG tablet Take 100 mg by mouth 3 (three) times daily.    Historical Provider, MD  isosorbide mononitrate (IMDUR) 60 MG 24 hr tablet Take 60 mg by mouth  daily as needed (blood pressure close to 200).    Historical Provider, MD  Multiple Vitamin (MULTIVITAMIN WITH MINERALS) TABS tablet Take 1 tablet by mouth daily.    Historical Provider, MD  predniSONE (DELTASONE) 20 MG tablet Take 40mg  for 2days then 20mg  for 2days then STOP 01/17/14   Zannie Cove, MD  roflumilast (DALIRESP) 500 MCG TABS tablet Take 500 mcg by mouth daily.    Historical Provider, MD  tiotropium (SPIRIVA) 18 MCG inhalation capsule Place 18 mcg into inhaler and inhale daily.    Historical Provider, MD  Valerian Root 500 MG CAPS Take 500 mg by mouth daily as needed (anxiety).     Historical Provider, MD  vitamin C (ASCORBIC ACID) 500 MG tablet Take 500 mg by mouth daily.    Historical Provider, MD   No Known Allergies  FAMILY HISTORY:  reported the following about his mother: brain tumor. He indicated that his father is deceased. He indicated that his paternal grandfather is deceased. He indicated that his paternal uncle is deceased.  SOCIAL HISTORY:  reports that he has been smoking Cigarettes.  He has a 4 pack-year smoking history. His smokeless tobacco use includes Snuff and Chew. He reports that he drinks alcohol. He reports that he does not use illicit drugs.  REVIEW OF SYSTEMS:   Negative except above  SUBJECTIVE:   VITAL SIGNS: Temp:  [97 F (36.1 C)-98.1 F (36.7 C)] 97 F (36.1 C) (10/03 1648) Pulse Rate:  [108-154] 123 (10/03 1648) Resp:  [19-26] 25 (10/03 1648) BP: (139-170)/(84-106) 139/106 mmHg (10/03 1648) SpO2:  [92 %-100 %] 100 % (10/03 1648) INTAKE / OUTPUT: No intake or output data in the 24 hours ending 02/09/14 1702  PHYSICAL EXAMINATION: General: no distress, wearing BiPAP, seen in HD Neuro:  Alert, normal strength, moves all extremities HEENT:  BiPAP mask on Cardiovascular:  Regular, tachycardic Lungs:  B/l rales, no wheeze Abdomen:  Soft, non tender, + bowel sounds Musculoskeletal:  No edema, Lt arm AV fistula Skin:  No  rashes  LABS:  CBC  Recent Labs Lab 02/09/14 1310  WBC 14.9*  HGB 9.3*  HCT 27.1*  PLT 372   BMET  Recent Labs Lab 02/09/14 1310  NA 129*  K 4.0  CL 75*  CO2 13*  BUN 86*  CREATININE 10.70*  GLUCOSE 121*   Electrolytes  Recent Labs Lab 02/09/14 1310  CALCIUM 8.9   Sepsis Markers  Recent Labs Lab 02/09/14 1558 02/09/14 1607  LATICACIDVEN 1.5 1.45   ABG  Recent Labs Lab 02/09/14 1427  PHART 7.367  PCO2ART 28.8*  PO2ART 77.0*   Liver Enzymes  Recent Labs Lab 02/09/14 1436  AST 39*  ALT 22  ALKPHOS 112  BILITOT 0.3  ALBUMIN 3.3*   Cardiac Enzymes  Recent Labs Lab 02/09/14 1310 02/09/14 1425  TROPONINI 0.51*  --   PROBNP  --  >70000.0*   Imaging Dg Chest Portable 1 View  02/09/2014   CLINICAL DATA:  Acute chest pain with shortness of breath, history of hypertension and systolic heart failure as well as peripheral vascular disease, alcohol abuse, gastric ulcers, anxiety and depression with chronic renal disease and personal history of congestive heart failure  EXAM: PORTABLE CHEST - 1 VIEW  COMPARISON:  01/14/2014  FINDINGS: Moderate cardiac enlargement. Moderate vascular congestion. Mild peribronchial cuffing and interstitial prominence. More focal hazy left suprahilar opacity. No pleural effusions.  IMPRESSION: Findings suggest mild cardiogenic interstitial pulmonary edema. Recommend radiographic follow-up to ensure resolution of left suprahilar opacity which likely represents mildly asymmetric edema.   Electronically Signed   By: Esperanza Heir M.D.   On: 02/09/2014 14:33      ASSESSMENT / PLAN:  PULMONARY A: Acute hypoxic respiratory failure. Hx of COPD, OSA.Marland Kitchen P:   PRN BiPAP Oxygen to keep SpO2 > 92% F/u CXR Symbicort, spiriva with prn albuterol Hold daliresp for now  CARDIOVASCULAR A:  A flutter. Diastolic heart failure. P:  Cardizem per primary team  RENAL A:   ESRD. Hyponatremia in setting of hypervolemia. P:    HD per renal  GASTROINTESTINAL A:   Nutrition. P:   Per primary team  HEMATOLOGIC A:   Anemia of chronic disease. P:  F/u CBC  INFECTIOUS A:   Infection seems less likely. P:   Empiric abx started by primary team >> if improves after HD, then likely can d/c abx soon  ENDOCRINE A:   No acute issues. P:   Monitor blood sugar on BMET  NEUROLOGIC A:   Depression with possible suicidal ideation. Hx of alcohol abuse. P:   Per primary team  Summary: He has respiratory distress likely from hypervolemia from missing HD, and a flutter.  Agree with admission to SDU after HD with fluid removal.  Likely can transition off BiPAP once HD completed.  D/w Dr. Mahala Menghini.  Coralyn Helling, MD [facility] Pulmonary/Critical Care 02/09/2014, 5:52 PM Pager:  [phone] After 3pm call: [phone]

## 2014-02-09 NOTE — ED Notes (Signed)
Per Dr. Mahala MenghiniSamtani, increase Cardizem to 10mg /hr.

## 2014-02-09 NOTE — Progress Notes (Signed)
Pt refused bipap at this time stating he is claustrophobic. Pt on 3L [place] at this time. ABG normal

## 2014-02-09 NOTE — Progress Notes (Signed)
Pt refusing bipap at this time due to claustrophobia. MD and RN aware

## 2014-02-10 DIAGNOSIS — F329 Major depressive disorder, single episode, unspecified: Secondary | ICD-10-CM

## 2014-02-10 DIAGNOSIS — I359 Nonrheumatic aortic valve disorder, unspecified: Secondary | ICD-10-CM

## 2014-02-10 DIAGNOSIS — F1010 Alcohol abuse, uncomplicated: Secondary | ICD-10-CM

## 2014-02-10 LAB — COMPREHENSIVE METABOLIC PANEL
ALK PHOS: 114 U/L (ref 39–117)
ALT: 22 U/L (ref 0–53)
AST: 33 U/L (ref 0–37)
Albumin: 3.4 g/dL — ABNORMAL LOW (ref 3.5–5.2)
Anion gap: 23 — ABNORMAL HIGH (ref 5–15)
BUN: 25 mg/dL — ABNORMAL HIGH (ref 6–23)
CALCIUM: 9.2 mg/dL (ref 8.4–10.5)
CO2: 25 mEq/L (ref 19–32)
Chloride: 91 mEq/L — ABNORMAL LOW (ref 96–112)
Creatinine, Ser: 4.13 mg/dL — ABNORMAL HIGH (ref 0.50–1.35)
GFR calc non Af Amer: 15 mL/min — ABNORMAL LOW (ref >90)
GFR, EST AFRICAN AMERICAN: 18 mL/min — AB (ref >90)
GLUCOSE: 123 mg/dL — AB (ref 70–99)
POTASSIUM: 3.9 meq/L (ref 3.7–5.3)
SODIUM: 139 meq/L (ref 137–147)
TOTAL PROTEIN: 8.1 g/dL (ref 6.0–8.3)
Total Bilirubin: 0.4 mg/dL (ref 0.3–1.2)

## 2014-02-10 LAB — CBC
HEMATOCRIT: 26.8 % — AB (ref 39.0–52.0)
Hemoglobin: 9.5 g/dL — ABNORMAL LOW (ref 13.0–17.0)
MCH: 33.9 pg (ref 26.0–34.0)
MCHC: 35.4 g/dL (ref 30.0–36.0)
MCV: 95.7 fL (ref 78.0–100.0)
PLATELETS: 312 10*3/uL (ref 150–400)
RBC: 2.8 MIL/uL — ABNORMAL LOW (ref 4.22–5.81)
RDW: 14.8 % (ref 11.5–15.5)
WBC: 9.1 10*3/uL (ref 4.0–10.5)

## 2014-02-10 LAB — TROPONIN I
TROPONIN I: 0.35 ng/mL — AB (ref <0.30)
Troponin I: 0.62 ng/mL (ref <0.30)
Troponin I: 0.43 ng/mL (ref <0.30)

## 2014-02-10 LAB — MRSA PCR SCREENING: MRSA by PCR: NEGATIVE

## 2014-02-10 LAB — PROCALCITONIN: Procalcitonin: 4.62 ng/mL

## 2014-02-10 LAB — HEMOGLOBIN A1C
HEMOGLOBIN A1C: 5.2 % (ref <5.7)
MEAN PLASMA GLUCOSE: 103 mg/dL (ref <117)

## 2014-02-10 LAB — PROTIME-INR
INR: 1.08 (ref 0.00–1.49)
Prothrombin Time: 14 seconds (ref 11.6–15.2)

## 2014-02-10 LAB — TSH: TSH: 0.515 u[IU]/mL (ref 0.350–4.500)

## 2014-02-10 MED ORDER — DILTIAZEM HCL ER COATED BEADS 120 MG PO CP24
120.0000 mg | ORAL_CAPSULE | Freq: Every day | ORAL | Status: DC
  Filled 2014-02-10: qty 1

## 2014-02-10 MED ORDER — NICOTINE 14 MG/24HR TD PT24
14.0000 mg | MEDICATED_PATCH | Freq: Every day | TRANSDERMAL | Status: DC
  Administered 2014-02-10 – 2014-02-12 (×3): 14 mg via TRANSDERMAL
  Filled 2014-02-10 (×4): qty 1

## 2014-02-10 MED ORDER — CALCIUM CARBONATE ANTACID 500 MG PO CHEW
400.0000 mg | CHEWABLE_TABLET | Freq: Three times a day (TID) | ORAL | Status: DC
  Administered 2014-02-10 – 2014-02-15 (×15): 400 mg via ORAL
  Filled 2014-02-10 (×21): qty 2

## 2014-02-10 MED ORDER — VANCOMYCIN HCL 500 MG IV SOLR
500.0000 mg | Freq: Once | INTRAVENOUS | Status: DC
  Filled 2014-02-10: qty 500

## 2014-02-10 MED ORDER — DILTIAZEM HCL ER COATED BEADS 120 MG PO CP24
120.0000 mg | ORAL_CAPSULE | Freq: Every day | ORAL | Status: DC
  Administered 2014-02-10 – 2014-02-16 (×7): 120 mg via ORAL
  Filled 2014-02-10 (×9): qty 1

## 2014-02-10 NOTE — Progress Notes (Signed)
[name] JXB:[id number] DOB: 04/17/1961 DOA: 02/09/2014 PCP: Wilmer Floor., MD  Brief narrative: 53 y/o ? h/o OSA, former smoker, Diastolic HF[Prior NICM/cardiogenic shock 30% with imporvement-50% 2013], ESRD TTS, PAD, COPD, Prior R Renal artery stent, poorly controlled Htn resented from dialysis Center with a multitude of complaints Found to have Hypoxic respiratory failure, new onset A flutter, SOB with missed dialysis Nephrology and CCM consulted    Past medical history-As per Problem list Chart reviewed as below-   Consultants:  Ccm  nephro  Procedures:    Antibiotics:  Vanc  Ceftriaxone    Subjective  Doing much better toelrated dialysis CP still present but more of a tightness tan pressur eor pain No n/v   Objective    Interim History:   Telemetry: Flutter relatively controlled ~ 100   Objective: Filed Vitals:   02/10/14 0500 02/10/14 0511 02/10/14 0600 02/10/14 0727  BP: 143/77  161/80   Pulse: 94  102 105  Temp:    98.3 F (36.8 C)  TempSrc:    Oral  Resp: 22  18 18   Height:      Weight:      SpO2:  100% 98% 99%    Intake/Output Summary (Last 24 hours) at 02/10/14 1308 Last data filed at 02/10/14 0600  Gross per 24 hour  Intake 601.83 ml  Output   3500 ml  Net -2898.17 ml    Exam:  General: alert pleasant oriented nad Cardiovascular: s1 s 2 no m/r/g Respiratory: clear no added sound Abdomen: soft, nt nd  Skin no le edema  Neuro intact  Data Reviewed: Basic Metabolic Panel:  Recent Labs Lab 02/09/14 1310 02/10/14 0435  NA 129* 139  K 4.0 3.9  CL 75* 91*  CO2 13* 25  GLUCOSE 121* 123*  BUN 86* 25*  CREATININE 10.70* 4.13*  CALCIUM 8.9 9.2   Liver Function Tests:  Recent Labs Lab 02/09/14 1436 02/10/14 0435  AST 39* 33  ALT 22 22  ALKPHOS 112 114  BILITOT 0.3 0.4  PROT 7.8 8.1  ALBUMIN 3.3* 3.4*   No results found for this basename: LIPASE, AMYLASE,  in the last 168 hours  Recent Labs Lab  02/09/14 1425  AMMONIA 26   CBC:  Recent Labs Lab 02/09/14 1310 02/10/14 0435  WBC 14.9* 9.1  NEUTROABS 13.3*  --   HGB 9.3* 9.5*  HCT 27.1* 26.8*  MCV 95.8 95.7  PLT 372 312   Cardiac Enzymes:  Recent Labs Lab 02/09/14 1310 02/09/14 2245 02/10/14 0435  TROPONINI 0.51* 0.62* 0.43*   BNP: No components found with this basename: POCBNP,  CBG: No results found for this basename: GLUCAP,  in the last 168 hours  Recent Results (from the past 240 hour(s))  CULTURE, BLOOD (ROUTINE X 2)     Status: None   Collection Time    02/09/14  3:03 PM      Result Value Ref Range Status   Specimen Description BLOOD RIGHT HAND   Final   Special Requests BOTTLES DRAWN AEROBIC AND ANAEROBIC 4CC   Final   Culture  Setup Time     Final   Value: 02/09/2014 20:32     Performed at [facility]   Culture     Final   Value:        BLOOD CULTURE RECEIVED NO GROWTH TO DATE CULTURE WILL BE HELD FOR 5 DAYS BEFORE ISSUING A FINAL NEGATIVE REPORT     Performed at [facility]   Report Status PENDING   Incomplete  CULTURE, BLOOD (ROUTINE X 2)     Status: None   Collection Time    02/09/14  3:46 PM      Result Value Ref Range Status   Specimen Description BLOOD RIGHT FOREARM   Final   Special Requests BOTTLES DRAWN AEROBIC ONLY 5CC   Final   Culture  Setup Time     Final   Value: 02/09/2014 21:45     Performed at [facility]   Culture     Final   Value:        BLOOD CULTURE RECEIVED NO GROWTH TO DATE CULTURE WILL BE HELD FOR 5 DAYS BEFORE ISSUING A FINAL NEGATIVE REPORT     Performed at [facility]   Report Status PENDING   Incomplete  MRSA PCR SCREENING     Status: None   Collection Time    02/09/14 10:38 PM      Result Value Ref Range Status   MRSA by PCR NEGATIVE  NEGATIVE Final   Comment:            The GeneXpert MRSA Assay (FDA     approved for NASAL specimens     only), is one component of a     comprehensive MRSA colonization     surveillance  program. It is not     intended to diagnose MRSA     infection nor to guide or     monitor treatment for     MRSA infections.     Studies:              All Imaging reviewed and is as per above notation   Scheduled Meds: . aspirin EC  325 mg Oral Daily  . budesonide-formoterol  2 puff Inhalation BID  . calcium acetate  667 mg Oral Q supper  . ceFEPime (MAXIPIME) IV  1 g Intravenous Q24H  . cloNIDine  0.2 mg Oral BID  . diltiazem  15 mg Intravenous Once  . [START ON 02/12/2014] ferric gluconate (FERRLECIT/NULECIT) IV  125 mg Intravenous Q T,Th,Sa-HD  . heparin  3,000 Units Dialysis Once in dialysis  . heparin  5,000 Units Subcutaneous 3 times per day  . isosorbide mononitrate  60 mg Oral Daily  . roflumilast  500 mcg Oral Daily  . sodium chloride  3 mL Intravenous Q12H  . tiotropium  18 mcg Inhalation Daily   Continuous Infusions: . diltiazem (CARDIZEM) infusion 5 mg/hr (02/10/14 0348)     Assessment/Plan: Respiratory-acute hypoxic and hypercarbic respiratory failure secondary to  A.] volume overload-patient has missed dialysis on 02/07/14 and nephrology has already seen him. He will need emergent dialysis. Defer management to nephrology  B.] possible HCAP vs. aspiration given chronic alcoholism see below  C. COPD-continue healthcare associated coverage, continue albuterol 2 puffs every 6 hourly as well as nebulization-no current wheeze therefore hold steroids-can start is it is noted that he has a wheeze. Continue Spiriva 18 mcg daily  D. obstructive sleep apnea history of noncompliance. Will place PRN at night order Infectious disease-  Aspiration vs HCAP-. Agree with empiric coverage vancomycin cefepime for now. WBC 14-->9  Trend his lactic acid as well a Procalcitonin. Sputum culture blood culture Legionella strep pneumo labs to be followed up  Rpt 2 view CXR 10/4 Cardiac  A. Atrial flutter-2-1 block. Carotid sinus massage attempted without any change. Placed on Cardizem drip.  And transtiion to home dose Cardizem Xr  120 daily.  If persistion elevated rates consult cardiology for input and need AC.  He tells me he has had this issue before and this is why he is on the cardizem. = B. chest pain-unclear if he actually is having pain from ischemic pain vs. pain that is MSK. Point-of-care troponin 0.5 peaked at 0.6 .echocardiogram 01/15/14 EF 65-70%. Await rpt ECHO. At this time does not need heparin. contnue 81 ASA daily C. potential decompensated heart failure with history of NICM 2012-monitor. BNP is useless in renal dysfunction. He is supposed to be on Imdur 60 mg and takes this on a when necessary-we will restart this  D. uncontrolled hypertension/hypertensive urgency-has missed couple doses of his medications. For now we will hold his amlodipine 10 mg,  and hydralazine 100 3 times a day. He will reimplement a slowly once Cardizem drip was in effect. We will restart his clonidine 0.2 mg twice a day  Renal/metabolic  A.-Anion gap acidosis 41 Delta gap 2.1 -resolved with dialysis Ethanol level is less than 11 B.-end-stage renal disease Tuesday Thursday [facility] nephrology input  C.-metabolic bone disease per renal  Endocrine  Pending HbA1c  Psychiatric  Continue Xanax 0.5 3 times a day when necessary anxiety.  He does not pose a threat to himself or doesn't and denies suicidal ideation  Psychiatry consulted 10/4 ashe mentioned depression and "killing himself" GI  Unclear etiology of n/v-place on Protonix po 40 bid.  Monitor for further issues  ordered phenergan  Neurology  No current issue   Code Status: full Family Communication:  None bedside Disposition Plan:  SDU   Pleas Koch, MD  [facility] Pager [phone] 02/10/2014, 9:24 AM    LOS: 1 day

## 2014-02-10 NOTE — Progress Notes (Signed)
  Echocardiogram 2D Echocardiogram has been performed.  [name] 02/10/2014, 8:40 AM

## 2014-02-10 NOTE — Consult Note (Signed)
[facility] Face-to-Face Psychiatry Consult   Reason for Consult:  Depression and alcohol use Referring Physician:  Dr Verda Cumins is an 53 y.o. male. Total Time spent with patient: 30 minutes  Assessment: AXIS I:  Alcohol Abuse and Depressive Disorder NOS AXIS II:  Deferred AXIS III:   Past Medical History  Diagnosis Date  . Hypertension   . Systolic heart failure     EF 25-30%  . Hyperlipidemia   . COPD (chronic obstructive pulmonary disease)   . Peripheral vascular disease   . Pneumonia 1980's  . OSA on CPAP   . ETOH abuse   . History of stomach ulcers   . Arthritis     "arms"  . Chronic lower back pain   . Anxiety   . Depression   . Adult ADHD     "never diagnosed; my son was; I think I've got it too"  . Chronic kidney disease (CKD), stage III (moderate)     baseline cr 2.3  . Renal cyst     left, complex  . Renal artery stenosis     a. s/p BMS to inferior branch of right renal artery 12/2011. On left, 3 renal arteries were noted, 2 of them were very small in size and subtotally occluded  . Anginal pain   . CHF (congestive heart failure)   . Seasonal allergies   . Hepatitis     "I think it was C"   AXIS IV:  other psychosocial or environmental problems, problems related to legal system/crime, problems related to social environment and problems with primary support group AXIS V:  51-60 moderate symptoms  Plan:  Supportive therapy provided about ongoing stressors. Discussed crisis plan, support from social network, calling 911, coming to the Emergency Department, and calling Suicide Hotline.  Subjective:   [name] is a 53 y.o. male patient admitted with breathing problem.    HPI:  Patient seen and chart reviewed. Patient is a 53 year old Caucasian, single, unemployed man who was admitted on the medical floor because of multiple health issues.  Consult was called because patient mentioned that he wanted to kill himself slowly by drinking alcohol.   Patient mentioned that he has been depressed in past 6 months and has been drinking more heavily and intensely.  He has been binge drinking and his last drink was on Thursday.  He admitted drinking over 1 gallon of vodka on that day.  Patient told he has been careless in his life in recent months because he lived by himself and he has no social support.  His wife deceased 3 years ago and since then he's been depressed isolated and withdrawn.  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  He's been recently charged for DWI and he has a court date tomorrow.  Patient admitted sometime having hallucinations and paranoia when he did drink too much.  He endorsed seeing things and hearing people talking to him.  He has history of treatment at [facility] in the past. Patient endorses history of blackouts and tremors but denies any seizures.  He lives by himself.  He has one son who is in prison and the other son he has no contact with him.  Patient is currently not seeing any psychiatrist or taking any psychotropic medication.  He is willing to try medication to help depression.  He denies any active suicidal plan but he wants to get  help to stop his drinking.  Patient afraid that if he go home he will start drinking again.  At this time he has some tremors and shakes however he is also getting Xanax which is helping.  Patient endorsed some time feeling hopeless helpless but denies any manic episodes, aggression or violence.  He endorses history of smoking marijuana many years ago.  He was also arrested for drug possession charge and currently he is unsupervised probation.   Past Psychiatric History: Past Medical History  Diagnosis Date  . Hypertension   . Systolic heart failure     EF 25-30%  . Hyperlipidemia   . COPD (chronic obstructive pulmonary disease)   . Peripheral vascular disease   . Pneumonia 1980's  . OSA on CPAP   . ETOH abuse   . History of  stomach ulcers   . Arthritis     "arms"  . Chronic lower back pain   . Anxiety   . Depression   . Adult ADHD     "never diagnosed; my son was; I think I've got it too"  . Chronic kidney disease (CKD), stage III (moderate)     baseline cr 2.3  . Renal cyst     left, complex  . Renal artery stenosis     a. s/p BMS to inferior branch of right renal artery 12/2011. On left, 3 renal arteries were noted, 2 of them were very small in size and subtotally occluded  . Anginal pain   . CHF (congestive heart failure)   . Seasonal allergies   . Hepatitis     "I think it was C"    reports that he has been smoking Cigarettes.  He has a 4 pack-year smoking history. His smokeless tobacco use includes Snuff and Chew. He reports that he drinks alcohol. He reports that he does not use illicit drugs. Family History  Problem Relation Age of Onset  . Brain cancer Mother   . Pancreatic cancer Father   . Heart failure Paternal Uncle   . Heart disease Paternal Uncle   . Heart failure Paternal Grandfather   . Heart disease Paternal Grandfather      Living Arrangements: Alone   Abuse/Neglect [facility]) Physical Abuse: Denies Verbal Abuse: Denies Sexual Abuse: Denies Allergies:  No Known Allergies  ACT Assessment Complete:  No:   Past Psychiatric History: Patient has no previous history of psychiatric inpatient treatment, suicidal attempt or mania.  He endorses by mouth visual and auditory hallucinations when he drinks alcohol.  He has history of blackouts and tremors but denies any seizures.  He lived by himself.  He has limited social network.  He has one history of detox treatment at [facility].  Patient has history of legal issues.  Currently he has charges for DWI and his court date is tomorrow.  He is on unsupervised probation because of this previous charges related to drug possession.  Objective: Blood pressure 157/82, pulse 110, temperature 97.7 F (36.5 C), temperature source Oral, resp. rate 21,  height _0  (1.676 m), weight 141 lb 1.5 oz (64 kg), SpO2 96.00%.Body mass index is 22.78 kg/(m^2). Results for orders placed during the hospital encounter of 02/09/14 (from the past 72 hour(s))  BASIC METABOLIC PANEL     Status: Abnormal   Collection Time    02/09/14  1:10 PM      Result Value Ref Range   Sodium 129 (*) 137 - 147 mEq/L   Potassium 4.0  3.7 - 5.3  mEq/L   Chloride 75 (*) 96 - 112 mEq/L   CO2 13 (*) 19 - 32 mEq/L   Glucose, Bld 121 (*) 70 - 99 mg/dL   BUN 86 (*) 6 - 23 mg/dL   Creatinine, Ser 10.70 (*) 0.50 - 1.35 mg/dL   Calcium 8.9  8.4 - 10.5 mg/dL   GFR calc non Af Amer 5 (*) >90 mL/min   GFR calc Af Amer 6 (*) >90 mL/min   Comment: (NOTE)     The eGFR has been calculated using the CKD EPI equation.     This calculation has not been validated in all clinical situations.     eGFR's persistently <90 mL/min signify possible Chronic Kidney     Disease.   Anion gap 41 (*) 5 - 15  CBC WITH DIFFERENTIAL     Status: Abnormal   Collection Time    02/09/14  1:10 PM      Result Value Ref Range   WBC 14.9 (*) 4.0 - 10.5 K/uL   RBC 2.83 (*) 4.22 - 5.81 MIL/uL   Hemoglobin 9.3 (*) 13.0 - 17.0 g/dL   HCT 27.1 (*) 39.0 - 52.0 %   MCV 95.8  78.0 - 100.0 fL   MCH 32.9  26.0 - 34.0 pg   MCHC 34.3  30.0 - 36.0 g/dL   RDW 14.5  11.5 - 15.5 %   Platelets 372  150 - 400 K/uL   Neutrophils Relative % 89 (*) 43 - 77 %   Lymphocytes Relative 5 (*) 12 - 46 %   Monocytes Relative 6  3 - 12 %   Eosinophils Relative 0  0 - 5 %   Basophils Relative 0  0 - 1 %   Neutro Abs 13.3 (*) 1.7 - 7.7 K/uL   Lymphs Abs 0.7  0.7 - 4.0 K/uL   Monocytes Absolute 0.9  0.1 - 1.0 K/uL   Eosinophils Absolute 0.0  0.0 - 0.7 K/uL   Basophils Absolute 0.0  0.0 - 0.1 K/uL   Smear Review MORPHOLOGY UNREMARKABLE    TROPONIN I     Status: Abnormal   Collection Time    02/09/14  1:10 PM      Result Value Ref Range   Troponin I 0.51 (*) <0.30 ng/mL   Comment:            Due to the release kinetics of  cTnI,     a negative result within the first hours     of the onset of symptoms does not rule out     myocardial infarction with certainty.     If myocardial infarction is still suspected,     repeat the test at appropriate intervals.     CRITICAL RESULT CALLED TO, READ BACK BY AND VERIFIED WITH:     TOWNSEND B.,RN 10.03.15 1443 BY JONESJ  AMMONIA     Status: None   Collection Time    02/09/14  2:25 PM      Result Value Ref Range   Ammonia 26  11 - 60 umol/L  PRO B NATRIURETIC PEPTIDE     Status: Abnormal   Collection Time    02/09/14  2:25 PM      Result Value Ref Range   Pro B Natriuretic peptide (BNP) >70000.0 (*) 0 - 125 pg/mL  I-STAT ARTERIAL BLOOD GAS, ED     Status: Abnormal   Collection Time    02/09/14  2:27 PM  Result Value Ref Range   pH, Arterial 7.367  7.350 - 7.450   pCO2 arterial 28.8 (*) 35.0 - 45.0 mmHg   pO2, Arterial 77.0 (*) 80.0 - 100.0 mmHg   Bicarbonate 16.5 (*) 20.0 - 24.0 mEq/L   TCO2 17  0 - 100 mmol/L   O2 Saturation 95.0     Acid-base deficit 8.0 (*) 0.0 - 2.0 mmol/L   Patient temperature 37.0 C     Collection site RADIAL, ALLEN'S TEST ACCEPTABLE     Drawn by VENIPUNCTURE     Sample type ARTERIAL    HEPATIC FUNCTION PANEL     Status: Abnormal   Collection Time    02/09/14  2:36 PM      Result Value Ref Range   Total Protein 7.8  6.0 - 8.3 g/dL   Albumin 3.3 (*) 3.5 - 5.2 g/dL   AST 39 (*) 0 - 37 U/L   ALT 22  0 - 53 U/L   Alkaline Phosphatase 112  39 - 117 U/L   Total Bilirubin 0.3  0.3 - 1.2 mg/dL   Bilirubin, Direct <0.2  0.0 - 0.3 mg/dL   Indirect Bilirubin NOT CALCULATED  0.3 - 0.9 mg/dL  ETHANOL     Status: None   Collection Time    02/09/14  2:36 PM      Result Value Ref Range   Alcohol, Ethyl (B) <11  0 - 11 mg/dL   Comment:            LOWEST DETECTABLE LIMIT FOR     SERUM ALCOHOL IS 11 mg/dL     FOR MEDICAL PURPOSES ONLY  OSMOLALITY     Status: Abnormal   Collection Time    02/09/14  2:36 PM      Result Value Ref Range    Osmolality 309 (*) 275 - 300 mOsm/kg   Comment: Performed at [place], BLOOD (ROUTINE X 2)     Status: None   Collection Time    02/09/14  3:03 PM      Result Value Ref Range   Specimen Description BLOOD RIGHT HAND     Special Requests BOTTLES DRAWN AEROBIC AND ANAEROBIC 4CC     Culture  Setup Time       Value: 02/09/2014 20:32     Performed at [facility]   Culture       Value:        BLOOD CULTURE RECEIVED NO GROWTH TO DATE CULTURE WILL BE HELD FOR 5 DAYS BEFORE ISSUING A FINAL NEGATIVE REPORT     Performed at [facility]   Report Status PENDING    CULTURE, BLOOD (ROUTINE X 2)     Status: None   Collection Time    02/09/14  3:46 PM      Result Value Ref Range   Specimen Description BLOOD RIGHT FOREARM     Special Requests BOTTLES DRAWN AEROBIC ONLY 5CC     Culture  Setup Time       Value: 02/09/2014 21:45     Performed at [facility]   Culture       Value:        BLOOD CULTURE RECEIVED NO GROWTH TO DATE CULTURE WILL BE HELD FOR 5 DAYS BEFORE ISSUING A FINAL NEGATIVE REPORT     Performed at [facility]   Report Status PENDING    ALCOHOL, METHYL (METHANOL), BLOOD     Status: None  Collection Time    02/09/14  3:46 PM      Result Value Ref Range   Methanol Lvl NONE DETECTED     Comment: RESULT CALLED TO, READ BACK BY AND VERIFIED WITH:     T.IRBY,RN 2030 02/09/14 M.CAMPBELL  PROCALCITONIN     Status: None   Collection Time    02/09/14  3:47 PM      Result Value Ref Range   Procalcitonin 3.66     Comment:            Interpretation:     PCT > 2 ng/mL:     Systemic infection (sepsis) is likely,     unless other causes are known.     (NOTE)             ICU PCT Algorithm               Non ICU PCT Algorithm        ----------------------------     ------------------------------             PCT < 0.25 ng/mL                 PCT < 0.1 ng/mL         Stopping of antibiotics            Stopping of antibiotics            strongly encouraged.               strongly encouraged.        ----------------------------     ------------------------------           PCT level decrease by               PCT < 0.25 ng/mL           >= 80% from peak PCT           OR PCT 0.25 - 0.5 ng/mL          Stopping of antibiotics                                                 encouraged.         Stopping of antibiotics               encouraged.        ----------------------------     ------------------------------           PCT level decrease by              PCT >= 0.25 ng/mL           < 80% from peak PCT            AND PCT >= 0.5 ng/mL            Continuing antibiotics                                                  encouraged.           Continuing antibiotics                encouraged.        ----------------------------     ------------------------------  PCT level increase compared          PCT > 0.5 ng/mL             with peak PCT AND              PCT >= 0.5 ng/mL             Escalation of antibiotics                                              strongly encouraged.          Escalation of antibiotics            strongly encouraged.  LACTIC ACID, PLASMA     Status: None   Collection Time    02/09/14  3:58 PM      Result Value Ref Range   Lactic Acid, Venous 1.5  0.5 - 2.2 mmol/L  I-STAT CG4 LACTIC ACID, ED     Status: None   Collection Time    02/09/14  4:07 PM      Result Value Ref Range   Lactic Acid, Venous 1.45  0.5 - 2.2 mmol/L  MRSA PCR SCREENING     Status: None   Collection Time    02/09/14 10:38 PM      Result Value Ref Range   MRSA by PCR NEGATIVE  NEGATIVE   Comment:            The GeneXpert MRSA Assay (FDA     approved for NASAL specimens     only), is one component of a     comprehensive MRSA colonization     surveillance program. It is not     intended to diagnose MRSA     infection nor to guide or     monitor treatment for     MRSA infections.  TROPONIN I     Status: Abnormal    Collection Time    02/09/14 10:45 PM      Result Value Ref Range   Troponin I 0.62 (*) <0.30 ng/mL   Comment:            Due to the release kinetics of cTnI,     a negative result within the first hours     of the onset of symptoms does not rule out     myocardial infarction with certainty.     If myocardial infarction is still suspected,     repeat the test at appropriate intervals.     CRITICAL VALUE NOTED.  VALUE IS CONSISTENT WITH PREVIOUSLY REPORTED AND CALLED VALUE.  TSH     Status: None   Collection Time    02/09/14 11:00 PM      Result Value Ref Range   TSH 0.515  0.350 - 4.500 uIU/mL  PROCALCITONIN     Status: None   Collection Time    02/10/14  4:35 AM      Result Value Ref Range   Procalcitonin 4.62     Comment:            Interpretation:     PCT > 2 ng/mL:     Systemic infection (sepsis) is likely,     unless other causes are known.     (NOTE)             ICU PCT Algorithm  Non ICU PCT Algorithm        ----------------------------     ------------------------------             PCT < 0.25 ng/mL                 PCT < 0.1 ng/mL         Stopping of antibiotics            Stopping of antibiotics           strongly encouraged.               strongly encouraged.        ----------------------------     ------------------------------           PCT level decrease by               PCT < 0.25 ng/mL           >= 80% from peak PCT           OR PCT 0.25 - 0.5 ng/mL          Stopping of antibiotics                                                 encouraged.         Stopping of antibiotics               encouraged.        ----------------------------     ------------------------------           PCT level decrease by              PCT >= 0.25 ng/mL           < 80% from peak PCT            AND PCT >= 0.5 ng/mL            Continuing antibiotics                                                  encouraged.           Continuing antibiotics                encouraged.         ----------------------------     ------------------------------         PCT level increase compared          PCT > 0.5 ng/mL             with peak PCT AND              PCT >= 0.5 ng/mL             Escalation of antibiotics                                              strongly encouraged.          Escalation of antibiotics            strongly encouraged.  COMPREHENSIVE METABOLIC PANEL  Status: Abnormal   Collection Time    02/10/14  4:35 AM      Result Value Ref Range   Sodium 139  137 - 147 mEq/L   Comment: DELTA CHECK NOTED   Potassium 3.9  3.7 - 5.3 mEq/L   Chloride 91 (*) 96 - 112 mEq/L   CO2 25  19 - 32 mEq/L   Glucose, Bld 123 (*) 70 - 99 mg/dL   BUN 25 (*) 6 - 23 mg/dL   Comment: DELTA CHECK NOTED   Creatinine, Ser 4.13 (*) 0.50 - 1.35 mg/dL   Comment: DELTA CHECK NOTED   Calcium 9.2  8.4 - 10.5 mg/dL   Total Protein 8.1  6.0 - 8.3 g/dL   Albumin 3.4 (*) 3.5 - 5.2 g/dL   AST 33  0 - 37 U/L   ALT 22  0 - 53 U/L   Alkaline Phosphatase 114  39 - 117 U/L   Total Bilirubin 0.4  0.3 - 1.2 mg/dL   GFR calc non Af Amer 15 (*) >90 mL/min   GFR calc Af Amer 18 (*) >90 mL/min   Comment: (NOTE)     The eGFR has been calculated using the CKD EPI equation.     This calculation has not been validated in all clinical situations.     eGFR's persistently <90 mL/min signify possible Chronic Kidney     Disease.   Anion gap 23 (*) 5 - 15  PROTIME-INR     Status: None   Collection Time    02/10/14  4:35 AM      Result Value Ref Range   Prothrombin Time 14.0  11.6 - 15.2 seconds   INR 1.08  0.00 - 1.49  TROPONIN I     Status: Abnormal   Collection Time    02/10/14  4:35 AM      Result Value Ref Range   Troponin I 0.43 (*) <0.30 ng/mL   Comment:            Due to the release kinetics of cTnI,     a negative result within the first hours     of the onset of symptoms does not rule out     myocardial infarction with certainty.     If myocardial infarction is still suspected,      repeat the test at appropriate intervals.     CRITICAL VALUE NOTED.  VALUE IS CONSISTENT WITH PREVIOUSLY REPORTED AND CALLED VALUE.  CBC     Status: Abnormal   Collection Time    02/10/14  4:35 AM      Result Value Ref Range   WBC 9.1  4.0 - 10.5 K/uL   RBC 2.80 (*) 4.22 - 5.81 MIL/uL   Hemoglobin 9.5 (*) 13.0 - 17.0 g/dL   HCT 26.8 (*) 39.0 - 52.0 %   MCV 95.7  78.0 - 100.0 fL   MCH 33.9  26.0 - 34.0 pg   MCHC 35.4  30.0 - 36.0 g/dL   RDW 14.8  11.5 - 15.5 %   Platelets 312  150 - 400 K/uL  TROPONIN I     Status: Abnormal   Collection Time    02/10/14 10:40 AM      Result Value Ref Range   Troponin I 0.35 (*) <0.30 ng/mL   Comment:            Due to the release kinetics of cTnI,     a negative result within the first  hours     of the onset of symptoms does not rule out     myocardial infarction with certainty.     If myocardial infarction is still suspected,     repeat the test at appropriate intervals.     CRITICAL VALUE NOTED.  VALUE IS CONSISTENT WITH PREVIOUSLY REPORTED AND CALLED VALUE.   Labs are reviewed and are pertinent for alcohol level less than 11.  Current Facility-Administered Medications  Medication Dose Route Frequency Provider Last Rate Last Dose  . 0.9 %  sodium chloride infusion  100 mL Intravenous PRN Ramiro Harvest, PA-C      . 0.9 %  sodium chloride infusion  100 mL Intravenous PRN Ramiro Harvest, PA-C      . albuterol (PROVENTIL) (2.5 MG/3ML) 0.083% nebulizer solution 2.5 mg  2.5 mg Nebulization Q4H PRN Nita Sells, MD   2.5 mg at 02/10/14 0511  . ALPRAZolam Duanne Moron) tablet 0.5 mg  0.5 mg Oral TID PRN Nita Sells, MD   0.5 mg at 02/10/14 1038  . aspirin EC tablet 325 mg  325 mg Oral Daily Nita Sells, MD   325 mg at 02/10/14 1037  . budesonide-formoterol (SYMBICORT) 80-4.5 MCG/ACT inhaler 2 puff  2 puff Inhalation BID Nita Sells, MD   2 puff at 02/10/14 0800  . calcium acetate (PHOSLO) capsule 667 mg  667 mg Oral Q supper  Nita Sells, MD      . calcium carbonate (TUMS - dosed in mg elemental calcium) chewable tablet 400 mg of elemental calcium  400 mg of elemental calcium Oral TID Nita Sells, MD   400 mg of elemental calcium at 02/10/14 1205  . ceFEPIme (MAXIPIME) 1 g in dextrose 5 % 50 mL IVPB  1 g Intravenous Q24H Mariea Clonts, MD      . cloNIDine (CATAPRES) tablet 0.2 mg  0.2 mg Oral BID Nita Sells, MD   0.2 mg at 02/10/14 1037  . diltiazem (CARDIZEM CD) 24 hr capsule 120 mg  120 mg Oral Daily Nita Sells, MD   120 mg at 02/10/14 1207  . diltiazem (CARDIZEM) 1 mg/mL load via infusion 15 mg  15 mg Intravenous Once Mariea Clonts, MD       And  . diltiazem (CARDIZEM) 100 mg in dextrose 5 % 100 mL (1 mg/mL) infusion  5-15 mg/hr Intravenous Continuous Mariea Clonts, MD 6 mL/hr at 02/10/14 1105 6 mg/hr at 02/10/14 1105  . feeding supplement (NEPRO CARB STEADY) liquid 237 mL  237 mL Oral PRN Ramiro Harvest, PA-C      . Derrill Memo ON 02/12/2014] ferric gluconate (NULECIT) 125 mg in sodium chloride 0.9 % 100 mL IVPB  125 mg Intravenous Q T,Th,Sa-HD Ramiro Harvest, PA-C      . heparin injection 1,000 Units  1,000 Units Dialysis PRN Ramiro Harvest, PA-C      . heparin injection 3,000 Units  3,000 Units Dialysis Once in dialysis Ramiro Harvest, PA-C      . heparin injection 5,000 Units  5,000 Units Subcutaneous 3 times per day Nita Sells, MD   5,000 Units at 02/10/14 0555  . HYDROcodone-acetaminophen (NORCO/VICODIN) 5-325 MG per tablet 1-2 tablet  1-2 tablet Oral Q4H PRN Nita Sells, MD   1 tablet at 02/10/14 1044  . isosorbide mononitrate (IMDUR) 24 hr tablet 60 mg  60 mg Oral Daily Ritta Slot, NP   60 mg at 02/10/14 1037  . lidocaine (PF) (XYLOCAINE) 1 % injection 5 mL  5 mL Intradermal PRN  Ramiro Harvest, PA-C      . lidocaine-prilocaine (EMLA) cream 1 application  1 application Topical PRN Ramiro Harvest, PA-C      . nitroGLYCERIN (NITROSTAT) SL tablet 0.4 mg  0.4 mg  Sublingual Q5 min PRN Ritta Slot, NP      . pentafluoroprop-tetrafluoroeth (GEBAUERS) aerosol 1 application  1 application Topical PRN Ramiro Harvest, PA-C      . roflumilast (DALIRESP) tablet 500 mcg  500 mcg Oral Daily Nita Sells, MD   500 mcg at 02/10/14 1037  . sodium chloride 0.9 % injection 3 mL  3 mL Intravenous Q12H Nita Sells, MD   3 mL at 02/10/14 1107  . tiotropium (SPIRIVA) inhalation capsule 18 mcg  18 mcg Inhalation Daily Nita Sells, MD   18 mcg at 02/10/14 0800    Psychiatric Specialty Exam:     Blood pressure 157/82, pulse 110, temperature 97.7 F (36.5 C), temperature source Oral, resp. rate 21, height _0  (1.676 m), weight 141 lb 1.5 oz (64 kg), SpO2 96.00%.Body mass index is 22.78 kg/(m^2).  General Appearance: Disheveled  Eye Sport and exercise psychologist::  Fair  Speech:  Slow  Volume:  Decreased  Mood:  Depressed and Dysphoric  Affect:  Constricted and Depressed  Thought Process:  Logical  Orientation:  Full (Time, Place, and Person)  Thought Content:  Rumination  Suicidal Thoughts:  Yes.  without intent/plan  Homicidal Thoughts:  No  Memory:  Immediate;   Fair Recent;   Fair Remote;   Fair  Judgement:  Fair  Insight:  Fair  Psychomotor Activity:  Decreased  Concentration:  Fair  Recall:  [employer] of Knowledge:Fair  Language: Fair  Akathisia:  No  Handed:  Right  AIMS (if indicated):     Assets:  Communication Skills Desire for Improvement Housing  Sleep:      Musculoskeletal: Strength & Muscle Tone: within normal limits Gait & Station: Unable to assess because patient is lying on the bed Patient leans: N/A  Treatment Plan Summary: Medication management, start Lexapro 5 mg daily to help the depression if not medically contraindicated.  Considering switching from Xanax to Ativan as patient is to have tremors and shakes. Patient does require inpatient rehabilitation for his alcohol addiction.  Please call consultation liaison services  tomorrow morning for followup.  Please call social worker for assessment and appropriate needs and referral.   Jazier Mcglamery T. 02/10/2014 12:37 PM

## 2014-02-10 NOTE — Progress Notes (Signed)
Patient appears anxious, slight tremor to hands.  No obvious sweating.  Denies itching, rhinorrhea, sensations of hot or cold.  Patient states, "I've been on a drinking binge and that's the reason I had to come here.  I know if I go home and don't get some help, I'll start drinking again.  I hope they send me to treatment".  PRN Xanax as ordered for anxiety, nicotine patch as ordered for nicotine withdrawal.  Emotional support to patient.

## 2014-02-10 NOTE — Progress Notes (Signed)
Patient ID: [name], male   DOB: 09/06/1960, 53 y.o.   MRN: [id number]  [facility] Progress Note   Assessment/ Plan:    1. Acute respiratory failure - sec volume overload and improved with HD/UF (net removal 3.5L). 2. Atrial flutter - remains in flutter with RVR on diltiazem gtt. 3. ESRD - HD on TTS @ [facility], K 4. Emergent HD done yesterday and without acute needs at this time- plan for next dialysis tomorrow to improve his respiratory status further. 4. Hypertension/volume - BP remains elevated on Cardizem drip; He takes Amlodipine, Clonidine & Hydralazine as outpatient; below EDW. 5. Anemia - Hgb 9.5, Aranesp 25 mcg on Tues, Venofer 100 mg x 10. 6. Metabolic bone disease - Ca 8.9 (9.5 corrected), last P 3.4, iPTH 87; no Hectorol, Phoslo with meals. 7. Nutrition - Alb 3.3, renal diet, multivitamin. 8. Alcohol abuse/ Depression - with recent suicidal ideation. 9. COPD - tobacco abuse, but down to 2 cigarettes/day.  Subjective:   Reports to be feeling better with improved shortness of breath. Looking forward to get some psychiatry help.   Objective:   BP 125/87  Pulse 111  Temp(Src) 98.3 F (36.8 C) (Oral)  Resp 23  Ht 5\' 6"  (1.676 m)  Wt 64 kg (141 lb 1.5 oz)  BMI 22.78 kg/m2  SpO2 99%  Physical Exam: ION:[id number]:Comfrotably resting in bed- O2 via [place] MWN:[id number]:Pulse irregular tachycardia, s1 and s2 normal Resp:Fine rales left base, no wheeze DGU:YQIHAbd:Soft, obese, NT, BS normal Ext:No lower LE edema  Labs: BMET  Recent Labs Lab 02/09/14 1310 02/10/14 0435  NA 129* 139  K 4.0 3.9  CL 75* 91*  CO2 13* 25  GLUCOSE 121* 123*  BUN 86* 25*  CREATININE 10.70* 4.13*  CALCIUM 8.9 9.2   CBC  Recent Labs Lab 02/09/14 1310 02/10/14 0435  WBC 14.9* 9.1  NEUTROABS 13.3*  --   HGB 9.3* 9.5*  HCT 27.1* 26.8*  MCV 95.8 95.7  PLT 372 312   Medications:    . aspirin EC  325 mg Oral Daily  . budesonide-formoterol  2 puff Inhalation BID  . calcium acetate  667 mg Oral  Q supper  . ceFEPime (MAXIPIME) IV  1 g Intravenous Q24H  . cloNIDine  0.2 mg Oral BID  . diltiazem  120 mg Oral Daily  . diltiazem  15 mg Intravenous Once  . [START ON 02/12/2014] ferric gluconate (FERRLECIT/NULECIT) IV  125 mg Intravenous Q T,Th,Sa-HD  . heparin  3,000 Units Dialysis Once in dialysis  . heparin  5,000 Units Subcutaneous 3 times per day  . isosorbide mononitrate  60 mg Oral Daily  . roflumilast  500 mcg Oral Daily  . sodium chloride  3 mL Intravenous Q12H  . tiotropium  18 mcg Inhalation Daily   Zetta BillsJay Anyela Napierkowski, MD 02/10/2014, 11:06 AM

## 2014-02-11 DIAGNOSIS — R45851 Suicidal ideations: Secondary | ICD-10-CM

## 2014-02-11 DIAGNOSIS — I483 Typical atrial flutter: Secondary | ICD-10-CM

## 2014-02-11 DIAGNOSIS — I739 Peripheral vascular disease, unspecified: Secondary | ICD-10-CM

## 2014-02-11 DIAGNOSIS — F1994 Other psychoactive substance use, unspecified with psychoactive substance-induced mood disorder: Secondary | ICD-10-CM

## 2014-02-11 DIAGNOSIS — I4892 Unspecified atrial flutter: Secondary | ICD-10-CM

## 2014-02-11 DIAGNOSIS — F1020 Alcohol dependence, uncomplicated: Secondary | ICD-10-CM

## 2014-02-11 LAB — RENAL FUNCTION PANEL
ANION GAP: 21 — AB (ref 5–15)
Albumin: 2.9 g/dL — ABNORMAL LOW (ref 3.5–5.2)
BUN: 52 mg/dL — AB (ref 6–23)
CHLORIDE: 87 meq/L — AB (ref 96–112)
CO2: 23 mEq/L (ref 19–32)
CREATININE: 6.2 mg/dL — AB (ref 0.50–1.35)
Calcium: 8.9 mg/dL (ref 8.4–10.5)
GFR calc Af Amer: 11 mL/min — ABNORMAL LOW (ref >90)
GFR calc non Af Amer: 9 mL/min — ABNORMAL LOW (ref >90)
GLUCOSE: 105 mg/dL — AB (ref 70–99)
POTASSIUM: 3.5 meq/L — AB (ref 3.7–5.3)
Phosphorus: 3.1 mg/dL (ref 2.3–4.6)
Sodium: 131 mEq/L — ABNORMAL LOW (ref 137–147)

## 2014-02-11 LAB — CBC
HEMATOCRIT: 25.8 % — AB (ref 39.0–52.0)
HEMOGLOBIN: 8.8 g/dL — AB (ref 13.0–17.0)
MCH: 32.8 pg (ref 26.0–34.0)
MCHC: 34.1 g/dL (ref 30.0–36.0)
MCV: 96.3 fL (ref 78.0–100.0)
Platelets: 293 10*3/uL (ref 150–400)
RBC: 2.68 MIL/uL — ABNORMAL LOW (ref 4.22–5.81)
RDW: 14.5 % (ref 11.5–15.5)
WBC: 13.7 10*3/uL — AB (ref 4.0–10.5)

## 2014-02-11 LAB — CULTURE, BLOOD (ROUTINE X 2)

## 2014-02-11 LAB — PROCALCITONIN: Procalcitonin: 4.07 ng/mL

## 2014-02-11 MED ORDER — HYDROXYZINE HCL 25 MG PO TABS
25.0000 mg | ORAL_TABLET | Freq: Four times a day (QID) | ORAL | Status: AC | PRN
  Administered 2014-02-11 – 2014-02-13 (×2): 25 mg via ORAL
  Filled 2014-02-11: qty 1

## 2014-02-11 MED ORDER — LORAZEPAM 1 MG PO TABS
1.0000 mg | ORAL_TABLET | Freq: Four times a day (QID) | ORAL | Status: AC | PRN
  Administered 2014-02-11 – 2014-02-14 (×4): 1 mg via ORAL
  Filled 2014-02-11 (×3): qty 1

## 2014-02-11 MED ORDER — LOPERAMIDE HCL 2 MG PO CAPS
2.0000 mg | ORAL_CAPSULE | ORAL | Status: AC | PRN
  Filled 2014-02-11: qty 2

## 2014-02-11 MED ORDER — ONDANSETRON 4 MG PO TBDP
4.0000 mg | ORAL_TABLET | Freq: Four times a day (QID) | ORAL | Status: AC | PRN
  Filled 2014-02-11: qty 1

## 2014-02-11 MED ORDER — VITAMIN B-1 100 MG PO TABS: 100.0000 mg | ORAL_TABLET | Freq: Every day | ORAL | Status: DC

## 2014-02-11 MED ORDER — ADULT MULTIVITAMIN W/MINERALS CH
1.0000 | ORAL_TABLET | Freq: Every day | ORAL | Status: DC
  Filled 2014-02-11: qty 1

## 2014-02-11 MED ORDER — HYDROCODONE-ACETAMINOPHEN 5-325 MG PO TABS
ORAL_TABLET | ORAL | Status: AC
  Filled 2014-02-11: qty 2

## 2014-02-11 MED ORDER — METOPROLOL TARTRATE 50 MG PO TABS
50.0000 mg | ORAL_TABLET | Freq: Three times a day (TID) | ORAL | Status: DC
  Administered 2014-02-11 – 2014-02-13 (×6): 50 mg via ORAL
  Filled 2014-02-11 (×13): qty 1

## 2014-02-11 MED ORDER — TRAZODONE 25 MG HALF TABLET
25.0000 mg | ORAL_TABLET | Freq: Every day | ORAL | Status: DC
  Administered 2014-02-11 – 2014-02-17 (×7): 25 mg via ORAL
  Filled 2014-02-11 (×8): qty 1

## 2014-02-11 MED ORDER — HYDROXYZINE HCL 25 MG PO TABS
25.0000 mg | ORAL_TABLET | Freq: Four times a day (QID) | ORAL | Status: DC | PRN
Start: 2014-02-11 — End: 2014-02-11
  Filled 2014-02-11: qty 1

## 2014-02-11 MED ORDER — VANCOMYCIN HCL IN DEXTROSE 750-5 MG/150ML-% IV SOLN
750.0000 mg | INTRAVENOUS | Status: AC
  Administered 2014-02-11: 750 mg via INTRAVENOUS
  Filled 2014-02-11 (×2): qty 150

## 2014-02-11 MED ORDER — THIAMINE HCL 100 MG/ML IJ SOLN
100.0000 mg | Freq: Once | INTRAMUSCULAR | Status: AC
  Administered 2014-02-11: 100 mg via INTRAMUSCULAR
  Filled 2014-02-11: qty 1

## 2014-02-11 MED ORDER — ADULT MULTIVITAMIN W/MINERALS CH
1.0000 | ORAL_TABLET | Freq: Every day | ORAL | Status: DC
  Administered 2014-02-11 – 2014-02-18 (×8): 1 via ORAL
  Filled 2014-02-11 (×8): qty 1

## 2014-02-11 MED ORDER — CHLORDIAZEPOXIDE HCL 25 MG PO CAPS: 25.0000 mg | ORAL_CAPSULE | Freq: Four times a day (QID) | ORAL | Status: DC | PRN

## 2014-02-11 MED ORDER — ALPRAZOLAM 0.5 MG PO TABS
ORAL_TABLET | ORAL | Status: AC
  Filled 2014-02-11: qty 1

## 2014-02-11 MED ORDER — HEPARIN SODIUM (PORCINE) 1000 UNIT/ML DIALYSIS: 3000.0000 [IU] | INTRAMUSCULAR | Status: DC | PRN

## 2014-02-11 MED ORDER — LOPERAMIDE HCL 2 MG PO CAPS
2.0000 mg | ORAL_CAPSULE | ORAL | Status: DC | PRN
Start: 2014-02-11 — End: 2014-02-11
  Filled 2014-02-11: qty 2

## 2014-02-11 MED ORDER — VITAMIN B-1 100 MG PO TABS
100.0000 mg | ORAL_TABLET | Freq: Every day | ORAL | Status: DC
  Administered 2014-02-12 – 2014-02-18 (×7): 100 mg via ORAL
  Filled 2014-02-11 (×7): qty 1

## 2014-02-11 MED ORDER — ONDANSETRON 4 MG PO TBDP
4.0000 mg | ORAL_TABLET | Freq: Four times a day (QID) | ORAL | Status: DC | PRN
  Filled 2014-02-11: qty 1

## 2014-02-11 NOTE — Progress Notes (Signed)
Subjective:  Seen on dialysis, breathing better, feels that he has no control of alcohol abuse and needs help  Objective: Vital signs in last 24 hours: Temp:  [97.2 F (36.2 C)-98.4 F (36.9 C)] 97.2 F (36.2 C) (10/05 0329) Pulse Rate:  [83-111] 104 (10/05 0930) Resp:  [16-26] 26 (10/05 0930) BP: (125-162)/(68-106) 128/73 mmHg (10/05 0930) SpO2:  [95 %-100 %] 100 % (10/05 0930) Weight:  [67.3 kg (148 lb 5.9 oz)] 67.3 kg (148 lb 5.9 oz) (10/05 0743) Weight change:   Intake/Output from previous day: 10/04 0701 - 10/05 0700 In: 631.8 [P.O.:480; I.V.:101.8; IV Piggyback:50] Out: -  Intake/Output this shift:  Lab Results:  Recent Labs  02/10/14 0435 02/11/14 0758  WBC 9.1 13.7*  HGB 9.5* 8.8*  HCT 26.8* 25.8*  PLT 312 293   BMET:  Recent Labs  02/10/14 0435 02/11/14 0758  NA 139 131*  K 3.9 3.5*  CL 91* 87*  CO2 25 23  GLUCOSE 123* 105*  BUN 25* 52*  CREATININE 4.13* 6.20*  CALCIUM 9.2 8.9  ALBUMIN 3.4* 2.9*   No results found for this basename: PTH,  in the last 72 hours Iron Studies: No results found for this basename: IRON, TIBC, TRANSFERRIN, FERRITIN,  in the last 72 hours  Studies/Results: No results found.   EXAM: General appearance:  Alert, in no apparent distress Resp:  CTA without rales, rhonchi, or wheezes Cardio:  Irregularly irregular, no murmur or rub GI: + BS, soft and nontender Extremities:  No edema Access:  AVF @ [facility] with BFR 400  Dialysis Orders: TTS @ [facility]  66.5 kg 4 hrs 3K/2.25Ca 400/A1.5 Heparin 3000 U AVF @ [facility]  No Hectorol Aranesp 25 mcg on Tues. Venofer 100 mg x 10 (through 10/20  Assessment/Plan: 1. Acute respiratory failure - sec volume overload per chest x-ray, missed HD and alcohol intoxication; nebulizer, O2 per [place], Vancomycin & Cefepime for ? PNA.  2. Atrial flutter - started Cardizem drip. 3. ESRD - HD on TTS @ [facility], K 3.5. HD today. 4. HTN/volume - BP 128/73 now on Cardizem drip; Amlodipine, Clonidine, Diltiazem, &  Hydralazine as outpatient; below EDW, but tolerating UF goal 3 L. 5. Anemia - Hgb 8.8, Aranesp 25 mcg on Tues, Venofer 100 mg x 10.  Increase Aranesp to 40 mcg. 6. Sec HPT - Ca 8.9 (9.8 corrected), P 3.1, iPTH 87; no Hectorol, Phoslo with meals. 7. Nutrition - Alb 2.9, renal diet, multivitamin. 8. Alcohol abuse 9. Depression - with recent suicidal ideation. 10. COPD - tobacco abuse, but down to 2 cigarettes/day. 11. ASCVD - Hx NSTEMI, R renal artery stenosis w/ stent. 12. OSA - noncompliant with CPAP.   LOS: 2 days   LYLES,CHARLES 02/11/2014,9:48 AM  Pt seen, examined and agree w A/P as above.  Vinson Moselleob Valery Chance MD pager [phone]    cell [phone] 02/11/2014, 12:47 PM

## 2014-02-11 NOTE — Progress Notes (Signed)
[facility] MD Progress Note  02/11/2014 1:44 PM [name]  MRN:  [id number]  Subjective:  Patient is a 53 year old Caucasian, single, unemployed man seen for psych consultation follow up. Patient has completed his hemodialysis without incidents. He has been depressed, anxious, tremors, shakes and has hallucinations mostly form alcohol withdrawal. Patient has taken his medication lexapro as prescribed for depression and tolerated with side effects. He has stated that he has been depressed and self medicating with alcohol since his wife died with acute myocardial infarction about five years ago. He stopped working and socialization since than. He admits suicidal ideation at admission but not denied suicide thoughts and wants to get into treatment for depression and alcohol rehab when medically cleared. He has plans to spend time with his grand children. He appreciate for spending time with him and talking about his psychosocial stresses. He has two children and lives out of the town he lives and he lives alone. He has history of attending psychosocial rehab groups in [facility] recovery in [place] and hoping to get into 28 days program in [place] [facility] substance abuse program. See below notes for more details.  He was admitted on the medical floor because of multiple health issues. Patient mentioned that he has been depressed in past 6 months and has been drinking more heavily and intensely. He has been binge drinking and his last drink was on Thursday. He admitted drinking over 1 gallon of vodka on that day. Patient told he has been careless in his life in recent months because he lived by himself and he has no social support. His wife deceased 3 years ago and since then he's been depressed isolated and withdrawn. 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 He's been recently charged for DWI and he has a court date tomorrow. Patient admitted sometime  having hallucinations and paranoia when he did drink too much. He endorsed seeing things and hearing people talking to him. He has history of treatment at [facility] in the past. Patient endorses history of blackouts and tremors but denies any seizures. He lives by himself. He has one son who is in prison and the other son he has no contact with him. Patient is currently not seeing any psychiatrist or taking any psychotropic medication. He is willing to try medication to help depression. He denies any active suicidal plan but he wants to get help to stop his drinking. Patient afraid that if he go home he will start drinking again. At this time he has some tremors and shakes however he is also getting Xanax which is helping. Patient endorsed some time feeling hopeless helpless but denies any manic episodes, aggression or violence. He endorses history of smoking marijuana many years ago. He was also arrested for drug possession charge and currently he is unsupervised probation.  Diagnosis:   DSM5: Schizophrenia Disorders:   Obsessive-Compulsive Disorders:   Trauma-Stressor Disorders:   Substance/Addictive Disorders:  Alcohol Intoxication with Use Disorder - Moderate (F10.229) Depressive Disorders:  Major Depressive Disorder - Unspecified (296.20) Total Time spent with patient: 30 minutes  Axis I: Substance Induced Mood Disorder and Alcohol dependence and substance induced mood disorder  ADL's:  Impaired  Sleep: Fair  Appetite:  Fair  Suicidal Ideation:  Endorses suicidal ideation but no plan Homicidal Ideation:  denied AEB (as evidenced by):  Psychiatric Specialty Exam: Physical Exam  ROS  Blood pressure 136/80, pulse 102, temperature 97.8 F (  36.6 C), temperature source Oral, resp. rate 20, height _0  (1.676 m), weight 63.9 kg (140 lb 14 oz), SpO2 100.00%.Body mass index is 22.75 kg/(m^2).  General Appearance: Casual  Eye Contact::  Good  Speech:  Clear and Coherent  Volume:  Normal   Mood:  Anxious and Depressed  Affect:  Appropriate and Congruent  Thought Process:  Coherent and Goal Directed  Orientation:  Full (Time, Place, and Person)  Thought Content:  WDL  Suicidal Thoughts:  Yes.  without intent/plan  Homicidal Thoughts:  No  Memory:  Immediate;   Fair Recent;   Fair  Judgement:  Intact  Insight:  Fair  Psychomotor Activity:  Decreased  Concentration:  Fair  Recall:  Good  Fund of Knowledge:Good  Language: Good  Akathisia:  NA  Handed:  Right  AIMS (if indicated):     Assets:  Communication Skills Desire for Improvement Financial Resources/Insurance Housing Leisure Time Resilience Social Support  Sleep:      Musculoskeletal: Strength & Muscle Tone: decreased Gait & Station: unable to stand Patient leans: N/A  Current Medications: Current Facility-Administered Medications  Medication Dose Route Frequency Provider Last Rate Last Dose  . albuterol (PROVENTIL) (2.5 MG/3ML) 0.083% nebulizer solution 2.5 mg  2.5 mg Nebulization Q4H PRN Nita Sells, MD   2.5 mg at 02/10/14 2042  . ALPRAZolam (XANAX) 0.5 MG tablet           . ALPRAZolam Duanne Moron) tablet 0.5 mg  0.5 mg Oral TID PRN Nita Sells, MD   0.5 mg at 02/11/14 0742  . aspirin EC tablet 325 mg  325 mg Oral Daily Nita Sells, MD   325 mg at 02/11/14 1307  . budesonide-formoterol (SYMBICORT) 80-4.5 MCG/ACT inhaler 2 puff  2 puff Inhalation BID Nita Sells, MD   2 puff at 02/10/14 2042  . calcium acetate (PHOSLO) capsule 667 mg  667 mg Oral Q supper Nita Sells, MD   667 mg at 02/10/14 1723  . calcium carbonate (TUMS - dosed in mg elemental calcium) chewable tablet 400 mg of elemental calcium  400 mg of elemental calcium Oral TID Nita Sells, MD   400 mg of elemental calcium at 02/11/14 1307  . ceFEPIme (MAXIPIME) 1 g in dextrose 5 % 50 mL IVPB  1 g Intravenous Q24H Mariea Clonts, MD   1 g at 02/10/14 1452  . chlordiazePOXIDE (LIBRIUM) capsule 25 mg   25 mg Oral Q6H PRN Nita Sells, MD      . cloNIDine (CATAPRES) tablet 0.2 mg  0.2 mg Oral BID Nita Sells, MD   0.2 mg at 02/11/14 1307  . diltiazem (CARDIZEM CD) 24 hr capsule 120 mg  120 mg Oral Daily Nita Sells, MD   120 mg at 02/11/14 1024  . diltiazem (CARDIZEM) 1 mg/mL load via infusion 15 mg  15 mg Intravenous Once Mariea Clonts, MD       And  . diltiazem (CARDIZEM) 100 mg in dextrose 5 % 100 mL (1 mg/mL) infusion  5-15 mg/hr Intravenous Continuous Mariea Clonts, MD   5 mg/hr at 02/11/14 0400  . [START ON 02/12/2014] ferric gluconate (NULECIT) 125 mg in sodium chloride 0.9 % 100 mL IVPB  125 mg Intravenous Q T,Th,Sa-HD Ramiro Harvest, PA-C      . heparin injection 5,000 Units  5,000 Units Subcutaneous 3 times per day Nita Sells, MD   5,000 Units at 02/11/14 0600  . HYDROcodone-acetaminophen (NORCO/VICODIN) 5-325 MG per tablet 1-2 tablet  1-2 tablet Oral  Q4H PRN Nita Sells, MD   2 tablet at 02/11/14 1306  . HYDROcodone-acetaminophen (NORCO/VICODIN) 5-325 MG per tablet           . hydrOXYzine (ATARAX/VISTARIL) tablet 25 mg  25 mg Oral Q6H PRN Nita Sells, MD      . isosorbide mononitrate (IMDUR) 24 hr tablet 60 mg  60 mg Oral Daily Ritta Slot, NP   60 mg at 02/11/14 1307  . loperamide (IMODIUM) capsule 2-4 mg  2-4 mg Oral PRN Nita Sells, MD      . multivitamin with minerals tablet 1 tablet  1 tablet Oral Daily Nita Sells, MD      . nicotine (NICODERM CQ - dosed in mg/24 hours) patch 14 mg  14 mg Transdermal Daily Nita Sells, MD   14 mg at 02/11/14 1308  . nitroGLYCERIN (NITROSTAT) SL tablet 0.4 mg  0.4 mg Sublingual Q5 min PRN Ritta Slot, NP      . ondansetron (ZOFRAN-ODT) disintegrating tablet 4 mg  4 mg Oral Q6H PRN Nita Sells, MD      . roflumilast (DALIRESP) tablet 500 mcg  500 mcg Oral Daily Nita Sells, MD   500 mcg at 02/11/14 1307  . sodium chloride 0.9 % injection 3 mL  3 mL  Intravenous Q12H Nita Sells, MD   3 mL at 02/11/14 1310  . thiamine (B-1) injection 100 mg  100 mg Intramuscular Once Nita Sells, MD      . Derrill Memo ON 02/12/2014] thiamine (VITAMIN B-1) tablet 100 mg  100 mg Oral Daily Nita Sells, MD      . tiotropium (SPIRIVA) inhalation capsule 18 mcg  18 mcg Inhalation Daily Nita Sells, MD   18 mcg at 02/10/14 0800    Lab Results:  Results for orders placed during the hospital encounter of 02/09/14 (from the past 48 hour(s))  AMMONIA     Status: None   Collection Time    02/09/14  2:25 PM      Result Value Ref Range   Ammonia 26  11 - 60 umol/L  PRO B NATRIURETIC PEPTIDE     Status: Abnormal   Collection Time    02/09/14  2:25 PM      Result Value Ref Range   Pro B Natriuretic peptide (BNP) >70000.0 (*) 0 - 125 pg/mL  I-STAT ARTERIAL BLOOD GAS, ED     Status: Abnormal   Collection Time    02/09/14  2:27 PM      Result Value Ref Range   pH, Arterial 7.367  7.350 - 7.450   pCO2 arterial 28.8 (*) 35.0 - 45.0 mmHg   pO2, Arterial 77.0 (*) 80.0 - 100.0 mmHg   Bicarbonate 16.5 (*) 20.0 - 24.0 mEq/L   TCO2 17  0 - 100 mmol/L   O2 Saturation 95.0     Acid-base deficit 8.0 (*) 0.0 - 2.0 mmol/L   Patient temperature 37.0 C     Collection site RADIAL, ALLEN'S TEST ACCEPTABLE     Drawn by VENIPUNCTURE     Sample type ARTERIAL    HEPATIC FUNCTION PANEL     Status: Abnormal   Collection Time    02/09/14  2:36 PM      Result Value Ref Range   Total Protein 7.8  6.0 - 8.3 g/dL   Albumin 3.3 (*) 3.5 - 5.2 g/dL   AST 39 (*) 0 - 37 U/L   ALT 22  0 - 53 U/L   Alkaline Phosphatase 112  39 - 117 U/L   Total Bilirubin 0.3  0.3 - 1.2 mg/dL   Bilirubin, Direct <0.2  0.0 - 0.3 mg/dL   Indirect Bilirubin NOT CALCULATED  0.3 - 0.9 mg/dL  ETHANOL     Status: None   Collection Time    02/09/14  2:36 PM      Result Value Ref Range   Alcohol, Ethyl (B) <11  0 - 11 mg/dL   Comment:            LOWEST DETECTABLE LIMIT FOR      SERUM ALCOHOL IS 11 mg/dL     FOR MEDICAL PURPOSES ONLY  OSMOLALITY     Status: Abnormal   Collection Time    02/09/14  2:36 PM      Result Value Ref Range   Osmolality 309 (*) 275 - 300 mOsm/kg   Comment: Performed at [place], BLOOD (ROUTINE X 2)     Status: None   Collection Time    02/09/14  3:03 PM      Result Value Ref Range   Specimen Description BLOOD RIGHT HAND     Special Requests BOTTLES DRAWN AEROBIC AND ANAEROBIC 4CC     Culture  Setup Time       Value: 02/09/2014 20:32     Performed at [facility]   Culture       Value:        BLOOD CULTURE RECEIVED NO GROWTH TO DATE CULTURE WILL BE HELD FOR 5 DAYS BEFORE ISSUING A FINAL NEGATIVE REPORT     Performed at [facility]   Report Status PENDING    CULTURE, BLOOD (ROUTINE X 2)     Status: None   Collection Time    02/09/14  3:46 PM      Result Value Ref Range   Specimen Description BLOOD RIGHT FOREARM     Special Requests BOTTLES DRAWN AEROBIC ONLY 5CC     Culture  Setup Time       Value: 02/09/2014 21:45     Performed at [facility]   Culture       Value: STAPHYLOCOCCUS SPECIES (COAGULASE NEGATIVE)     Note: THE SIGNIFICANCE OF ISOLATING THIS ORGANISM FROM A SINGLE SET OF BLOOD CULTURES WHEN MULTIPLE SETS ARE DRAWN IS UNCERTAIN. PLEASE NOTIFY THE MICROBIOLOGY DEPARTMENT WITHIN ONE WEEK IF SPECIATION AND SENSITIVITIES ARE REQUIRED.     Note: Gram Stain Report Called to,Read Back By and Verified With: KENYETTA EASON 02/10/14 1515 BY SMITHERSJ     Performed at [facility]   Report Status 02/11/2014 FINAL    ALCOHOL, METHYL (METHANOL), BLOOD     Status: None   Collection Time    02/09/14  3:46 PM      Result Value Ref Range   Methanol Lvl NONE DETECTED     Comment: RESULT CALLED TO, READ BACK BY AND VERIFIED WITH:     T.IRBY,RN 2030 02/09/14 M.CAMPBELL  PROCALCITONIN     Status: None   Collection Time    02/09/14  3:47 PM      Result Value Ref Range    Procalcitonin 3.66     Comment:            Interpretation:     PCT > 2 ng/mL:     Systemic infection (sepsis) is likely,     unless other causes are known.     (NOTE)  ICU PCT Algorithm               Non ICU PCT Algorithm        ----------------------------     ------------------------------             PCT < 0.25 ng/mL                 PCT < 0.1 ng/mL         Stopping of antibiotics            Stopping of antibiotics           strongly encouraged.               strongly encouraged.        ----------------------------     ------------------------------           PCT level decrease by               PCT < 0.25 ng/mL           >= 80% from peak PCT           OR PCT 0.25 - 0.5 ng/mL          Stopping of antibiotics                                                 encouraged.         Stopping of antibiotics               encouraged.        ----------------------------     ------------------------------           PCT level decrease by              PCT >= 0.25 ng/mL           < 80% from peak PCT            AND PCT >= 0.5 ng/mL            Continuing antibiotics                                                  encouraged.           Continuing antibiotics                encouraged.        ----------------------------     ------------------------------         PCT level increase compared          PCT > 0.5 ng/mL             with peak PCT AND              PCT >= 0.5 ng/mL             Escalation of antibiotics                                              strongly encouraged.          Escalation of antibiotics  strongly encouraged.  LACTIC ACID, PLASMA     Status: None   Collection Time    02/09/14  3:58 PM      Result Value Ref Range   Lactic Acid, Venous 1.5  0.5 - 2.2 mmol/L  I-STAT CG4 LACTIC ACID, ED     Status: None   Collection Time    02/09/14  4:07 PM      Result Value Ref Range   Lactic Acid, Venous 1.45  0.5 - 2.2 mmol/L  HEMOGLOBIN A1C     Status: None    Collection Time    02/09/14  4:12 PM      Result Value Ref Range   Hemoglobin A1C 5.2  <5.7 %   Comment: (NOTE)                                                                               According to the ADA Clinical Practice Recommendations for 2011, when     HbA1c is used as a screening test:      >=6.5%   Diagnostic of Diabetes Mellitus               (if abnormal result is confirmed)     5.7-6.4%   Increased risk of developing Diabetes Mellitus     References:Diagnosis and Classification of Diabetes Mellitus,Diabetes     VELF,8101,75(ZWCHE 1):S62-S69 and Standards of Medical Care in             Diabetes - 2011,Diabetes Care,2011,34 (Suppl 1):S11-S61.   Mean Plasma Glucose 103  <117 mg/dL   Comment: Performed at [place] PCR SCREENING     Status: None   Collection Time    02/09/14 10:38 PM      Result Value Ref Range   MRSA by PCR NEGATIVE  NEGATIVE   Comment:            The GeneXpert MRSA Assay (FDA     approved for NASAL specimens     only), is one component of a     comprehensive MRSA colonization     surveillance program. It is not     intended to diagnose MRSA     infection nor to guide or     monitor treatment for     MRSA infections.  TROPONIN I     Status: Abnormal   Collection Time    02/09/14 10:45 PM      Result Value Ref Range   Troponin I 0.62 (*) <0.30 ng/mL   Comment:            Due to the release kinetics of cTnI,     a negative result within the first hours     of the onset of symptoms does not rule out     myocardial infarction with certainty.     If myocardial infarction is still suspected,     repeat the test at appropriate intervals.     CRITICAL VALUE NOTED.  VALUE IS CONSISTENT WITH PREVIOUSLY REPORTED AND CALLED VALUE.  TSH     Status: None   Collection Time    02/09/14 11:00 PM      Result Value Ref  Range   TSH 0.515  0.350 - 4.500 uIU/mL  PROCALCITONIN     Status: None   Collection Time    02/10/14  4:35 AM       Result Value Ref Range   Procalcitonin 4.62     Comment:            Interpretation:     PCT > 2 ng/mL:     Systemic infection (sepsis) is likely,     unless other causes are known.     (NOTE)             ICU PCT Algorithm               Non ICU PCT Algorithm        ----------------------------     ------------------------------             PCT < 0.25 ng/mL                 PCT < 0.1 ng/mL         Stopping of antibiotics            Stopping of antibiotics           strongly encouraged.               strongly encouraged.        ----------------------------     ------------------------------           PCT level decrease by               PCT < 0.25 ng/mL           >= 80% from peak PCT           OR PCT 0.25 - 0.5 ng/mL          Stopping of antibiotics                                                 encouraged.         Stopping of antibiotics               encouraged.        ----------------------------     ------------------------------           PCT level decrease by              PCT >= 0.25 ng/mL           < 80% from peak PCT            AND PCT >= 0.5 ng/mL            Continuing antibiotics                                                  encouraged.           Continuing antibiotics                encouraged.        ----------------------------     ------------------------------         PCT level increase compared          PCT > 0.5 ng/mL  with peak PCT AND              PCT >= 0.5 ng/mL             Escalation of antibiotics                                              strongly encouraged.          Escalation of antibiotics            strongly encouraged.  COMPREHENSIVE METABOLIC PANEL     Status: Abnormal   Collection Time    02/10/14  4:35 AM      Result Value Ref Range   Sodium 139  137 - 147 mEq/L   Comment: DELTA CHECK NOTED   Potassium 3.9  3.7 - 5.3 mEq/L   Chloride 91 (*) 96 - 112 mEq/L   CO2 25  19 - 32 mEq/L   Glucose, Bld 123 (*) 70 - 99 mg/dL   BUN 25 (*)  6 - 23 mg/dL   Comment: DELTA CHECK NOTED   Creatinine, Ser 4.13 (*) 0.50 - 1.35 mg/dL   Comment: DELTA CHECK NOTED   Calcium 9.2  8.4 - 10.5 mg/dL   Total Protein 8.1  6.0 - 8.3 g/dL   Albumin 3.4 (*) 3.5 - 5.2 g/dL   AST 33  0 - 37 U/L   ALT 22  0 - 53 U/L   Alkaline Phosphatase 114  39 - 117 U/L   Total Bilirubin 0.4  0.3 - 1.2 mg/dL   GFR calc non Af Amer 15 (*) >90 mL/min   GFR calc Af Amer 18 (*) >90 mL/min   Comment: (NOTE)     The eGFR has been calculated using the CKD EPI equation.     This calculation has not been validated in all clinical situations.     eGFR's persistently <90 mL/min signify possible Chronic Kidney     Disease.   Anion gap 23 (*) 5 - 15  PROTIME-INR     Status: None   Collection Time    02/10/14  4:35 AM      Result Value Ref Range   Prothrombin Time 14.0  11.6 - 15.2 seconds   INR 1.08  0.00 - 1.49  TROPONIN I     Status: Abnormal   Collection Time    02/10/14  4:35 AM      Result Value Ref Range   Troponin I 0.43 (*) <0.30 ng/mL   Comment:            Due to the release kinetics of cTnI,     a negative result within the first hours     of the onset of symptoms does not rule out     myocardial infarction with certainty.     If myocardial infarction is still suspected,     repeat the test at appropriate intervals.     CRITICAL VALUE NOTED.  VALUE IS CONSISTENT WITH PREVIOUSLY REPORTED AND CALLED VALUE.  CBC     Status: Abnormal   Collection Time    02/10/14  4:35 AM      Result Value Ref Range   WBC 9.1  4.0 - 10.5 K/uL   RBC 2.80 (*) 4.22 - 5.81 MIL/uL   Hemoglobin 9.5 (*) 13.0 - 17.0 g/dL   HCT 26.8 (*)  39.0 - 52.0 %   MCV 95.7  78.0 - 100.0 fL   MCH 33.9  26.0 - 34.0 pg   MCHC 35.4  30.0 - 36.0 g/dL   RDW 14.8  11.5 - 15.5 %   Platelets 312  150 - 400 K/uL  TROPONIN I     Status: Abnormal   Collection Time    02/10/14 10:40 AM      Result Value Ref Range   Troponin I 0.35 (*) <0.30 ng/mL   Comment:            Due to the release  kinetics of cTnI,     a negative result within the first hours     of the onset of symptoms does not rule out     myocardial infarction with certainty.     If myocardial infarction is still suspected,     repeat the test at appropriate intervals.     CRITICAL VALUE NOTED.  VALUE IS CONSISTENT WITH PREVIOUSLY REPORTED AND CALLED VALUE.  PROCALCITONIN     Status: None   Collection Time    02/11/14  2:52 AM      Result Value Ref Range   Procalcitonin 4.07     Comment:            Interpretation:     PCT > 2 ng/mL:     Systemic infection (sepsis) is likely,     unless other causes are known.     (NOTE)             ICU PCT Algorithm               Non ICU PCT Algorithm        ----------------------------     ------------------------------             PCT < 0.25 ng/mL                 PCT < 0.1 ng/mL         Stopping of antibiotics            Stopping of antibiotics           strongly encouraged.               strongly encouraged.        ----------------------------     ------------------------------           PCT level decrease by               PCT < 0.25 ng/mL           >= 80% from peak PCT           OR PCT 0.25 - 0.5 ng/mL          Stopping of antibiotics                                                 encouraged.         Stopping of antibiotics               encouraged.        ----------------------------     ------------------------------           PCT level decrease by              PCT >= 0.25 ng/mL           <  80% from peak PCT            AND PCT >= 0.5 ng/mL            Continuing antibiotics                                                  encouraged.           Continuing antibiotics                encouraged.        ----------------------------     ------------------------------         PCT level increase compared          PCT > 0.5 ng/mL             with peak PCT AND              PCT >= 0.5 ng/mL             Escalation of antibiotics                                               strongly encouraged.          Escalation of antibiotics            strongly encouraged.  RENAL FUNCTION PANEL     Status: Abnormal   Collection Time    02/11/14  7:58 AM      Result Value Ref Range   Sodium 131 (*) 137 - 147 mEq/L   Potassium 3.5 (*) 3.7 - 5.3 mEq/L   Chloride 87 (*) 96 - 112 mEq/L   CO2 23  19 - 32 mEq/L   Glucose, Bld 105 (*) 70 - 99 mg/dL   BUN 52 (*) 6 - 23 mg/dL   Comment: DELTA CHECK NOTED   Creatinine, Ser 6.20 (*) 0.50 - 1.35 mg/dL   Comment: DELTA CHECK NOTED   Calcium 8.9  8.4 - 10.5 mg/dL   Phosphorus 3.1  2.3 - 4.6 mg/dL   Albumin 2.9 (*) 3.5 - 5.2 g/dL   GFR calc non Af Amer 9 (*) >90 mL/min   GFR calc Af Amer 11 (*) >90 mL/min   Comment: (NOTE)     The eGFR has been calculated using the CKD EPI equation.     This calculation has not been validated in all clinical situations.     eGFR's persistently <90 mL/min signify possible Chronic Kidney     Disease.   Anion gap 21 (*) 5 - 15  CBC     Status: Abnormal   Collection Time    02/11/14  7:58 AM      Result Value Ref Range   WBC 13.7 (*) 4.0 - 10.5 K/uL   RBC 2.68 (*) 4.22 - 5.81 MIL/uL   Hemoglobin 8.8 (*) 13.0 - 17.0 g/dL   HCT 25.8 (*) 39.0 - 52.0 %   MCV 96.3  78.0 - 100.0 fL   MCH 32.8  26.0 - 34.0 pg   MCHC 34.1  30.0 - 36.0 g/dL   RDW 14.5  11.5 - 15.5 %   Platelets 293  150 - 400 K/uL    Physical Findings: AIMS:  , ,  ,  ,  CIWA:  CIWA-Ar Total: 7 COWS:     Treatment Plan Summary: Daily contact with patient to assess and evaluate symptoms and progress in treatment Medication management  Plan: Start Trazodone 25 mg PO Qhs for isomnia Start ativan alcohol detox and CIWA protocol as discussed with Dr. Brett Albino Continue Lexapro 5 mg PO Qam and may increase if tolerated and has no GI side effects Refer to in-patient psych treatment for substance abuse and depression Discontinue xanax if medically not contraindicated Psych consult will follow up as clinically  required  Medical Decision Making Problem Points:  Established problem, worsening (2), New problem, with additional work-up planned (4), Review of last therapy session (1) and Review of psycho-social stressors (1) Data Points:  Review or order clinical lab tests (1) Review and summation of old records (2) Review of medication regiment & side effects (2) Review of new medications or change in dosage (2)  I certify that inpatient services furnished can reasonably be expected to improve the patient's condition.   Jabori Henegar,JANARDHAHA R. 02/11/2014, 1:44 PM

## 2014-02-11 NOTE — Consult Note (Signed)
CARDIOLOGY CONSULT NOTE       Patient ID: [name] MRN: [id number] DOB/AGE: 53/06/1960 53 y.o.  Admit date: 02/09/2014 Referring Physician:  Mahala MenghiniSamtani Primary Physician: Wilmer FloorAMPBELL, STEPHEN D., MD Primary Cardiologist:  Bensimohn/ Arida Reason for Consultation:  Atrial flutter   Active Problems:   Acute respiratory failure   HPI:  53 up alcoholic admitted admitted from [place] dialysis center with multiple complaints.  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  Drinks  Vodka daily  Has not been to [employer] meeting in 6 months.  Also complained of dyspnea cough and nausea.  Developed atrial flutter while in hospital.  History of NIDCM.  EF returned to normal. Echo this admission EF  60-65%.  He is unaware of flutter. No chest pain No history of CAD but has had renal artery stent in past followed by Dr Kirke CorinArida.  CXR on admission suggested mild CHF.  Also has significant COPD not compoliant with CPAP.   Chronic dialysis in [place].  Not clear what dry weight is.    ROS All other systems reviewed and negative except as noted above  Past Medical History  Diagnosis Date  . Hypertension   . Systolic heart failure     EF 40-98%25-30%  . Hyperlipidemia   . COPD (chronic obstructive pulmonary disease)   . Peripheral vascular disease   . Pneumonia 1980's  . OSA on CPAP   . ETOH abuse   . History of stomach ulcers   . Arthritis     "arms"  . Chronic lower back pain   . Anxiety   . Depression   . Adult ADHD     "never diagnosed; my son was; I think I've got it too"  . Chronic kidney disease (CKD), stage III (moderate)     baseline cr 2.3  . Renal cyst     left, complex  . Renal artery stenosis     a. s/p BMS to inferior branch of right renal artery 12/2011. On left, 3 renal arteries were noted, 2 of them were very small in size and subtotally occluded  . Anginal pain   . CHF (congestive heart failure)   . Seasonal allergies     . Hepatitis     "I think it was C"    Family History  Problem Relation Age of Onset  . Brain cancer Mother   . Pancreatic cancer Father   . Heart failure Paternal Uncle   . Heart disease Paternal Uncle   . Heart failure Paternal Grandfather   . Heart disease Paternal Grandfather     History   Social History  . Marital Status: Widowed    Spouse Name: N/A    Number of Children: N/A  . Years of Education: N/A   Occupational History  . Not on file.   Social History Main Topics  . Smoking status: Current Every Day Smoker -- 0.20 packs/day for 20 years    Types: Cigarettes    Last Attempt to Quit: 12/08/2012  . Smokeless tobacco: Current User    Types: Snuff, Chew  . Alcohol Use: Yes     Comment: Pt states he usually has 2 drinks per day.  . Drug Use: No  . Sexual Activity: Not Currently   Other Topics Concern  . Not on file   Social History Narrative  . No narrative on file    Past Surgical History  Procedure Laterality Date  .  Renal artery stent  01/05/2012    right inferior  . No past surgeries    . Av fistula placement Left 10/10/2012    Procedure: ARTERIOVENOUS (AV) FISTULA CREATION;  Surgeon: Sherren Kerns, MD;  Location: [facility];  Service: Vascular;  Laterality: Left;  Brachio-cephalic     . ALPRAZolam      . aspirin EC  325 mg Oral Daily  . budesonide-formoterol  2 puff Inhalation BID  . calcium acetate  667 mg Oral Q supper  . calcium carbonate  400 mg of elemental calcium Oral TID  . ceFEPime (MAXIPIME) IV  1 g Intravenous Q24H  . cloNIDine  0.2 mg Oral BID  . diltiazem  120 mg Oral Daily  . diltiazem  15 mg Intravenous Once  . [START ON 02/12/2014] ferric gluconate (FERRLECIT/NULECIT) IV  125 mg Intravenous Q T,Th,Sa-HD  . heparin  5,000 Units Subcutaneous 3 times per day  . HYDROcodone-acetaminophen      . isosorbide mononitrate  60 mg Oral Daily  . multivitamin with minerals  1 tablet Oral Daily  . nicotine  14 mg Transdermal Daily  . roflumilast   500 mcg Oral Daily  . sodium chloride  3 mL Intravenous Q12H  . thiamine  100 mg Intramuscular Once  . [START ON 02/12/2014] thiamine  100 mg Oral Daily  . tiotropium  18 mcg Inhalation Daily   . diltiazem (CARDIZEM) infusion Stopped (02/11/14 0500)    Physical Exam: Blood pressure 136/80, pulse 102, temperature 97.8 F (36.6 C), temperature source Oral, resp. rate 20, height 5\' 6"  (1.676 m), weight 140 lb 14 oz (63.9 kg), SpO2 100.00%.   Affect appropriate Desheveled chronically ill male  HEENT: normal Neck supple with no adenopathy JVP normal no bruits no thyromegaly Lungs clear with no wheezing and good diaphragmatic motion Heart:  S1/S2 bruit from fistula  murmur, no rub, gallop or click PMI normal Abdomen: benighn, BS positve, no tenderness, no AAA no bruit.  No HSM or HJR Fistula LUE with thrill  No edema Neuro non-focal Skin warm and dry No muscular weakness   Labs:   Lab Results  Component Value Date   WBC 13.7* 02/11/2014   HGB 8.8* 02/11/2014   HCT 25.8* 02/11/2014   MCV 96.3 02/11/2014   PLT 293 02/11/2014    Recent Labs Lab 02/10/14 0435 02/11/14 0758  NA 139 131*  K 3.9 3.5*  CL 91* 87*  CO2 25 23  BUN 25* 52*  CREATININE 4.13* 6.20*  CALCIUM 9.2 8.9  PROT 8.1  --   BILITOT 0.4  --   ALKPHOS 114  --   ALT 22  --   AST 33  --   GLUCOSE 123* 105*   Lab Results  Component Value Date   CKTOTAL 151 03/25/2011   CKMB 5.6* 03/25/2011   TROPONINI 0.35* 02/10/2014     Radiology: Dg Chest 2 View  01/14/2014   CLINICAL DATA:  Chest pain, shortness of breath  EXAM: CHEST  2 VIEW  COMPARISON:  01/14/2014 at 10:48 a.m. at [facility], available for comparison on [facility]  FINDINGS: The heart size is at upper limits of normal. Both lungs are hypoaerated with minimal bibasilar atelectasis reidentified. The visualized skeletal structures are unremarkable.  IMPRESSION: No significant change since prior exam obtained earlier today at [facility] at  10:48 a.m., available for comparison on [facility].   Electronically Signed   By: Christiana Pellant M.D.   On: 01/14/2014 18:04  Ct Head Wo Contrast  01/15/2014   CLINICAL DATA:  Severe right occipital headache for 1 week with dizziness.  EXAM: CT HEAD WITHOUT CONTRAST  TECHNIQUE: Contiguous axial images were obtained from the base of the skull through the vertex without intravenous contrast.  COMPARISON:  Prior CT from 01/14/2014  FINDINGS: Diffuse prominence of the CSF containing spaces is compatible with generalized cerebral atrophy. Scattered and confluent hypodensity within the periventricular and deep white matter both cerebral hemispheres is most consistent with mild chronic small vessel ischemic disease.  No acute intracranial hemorrhage or infarct. No mass lesion or midline shift. No hydrocephalus. No extra-axial fluid collection.  Calvarium is intact. No acute abnormality seen within the scalp soft tissues. Orbital soft tissues within normal limits.  IMPRESSION: 1. No acute intracranial abnormality. No findings to explain headaches. 2. Atrophy with chronic microvascular ischemic disease.   Electronically Signed   By: Rise Mu M.D.   On: 01/15/2014 06:33   Dg Chest Portable 1 View  02/09/2014   CLINICAL DATA:  Acute chest pain with shortness of breath, history of hypertension and systolic heart failure as well as peripheral vascular disease, alcohol abuse, gastric ulcers, anxiety and depression with chronic renal disease and personal history of congestive heart failure  EXAM: PORTABLE CHEST - 1 VIEW  COMPARISON:  01/14/2014  FINDINGS: Moderate cardiac enlargement. Moderate vascular congestion. Mild peribronchial cuffing and interstitial prominence. More focal hazy left suprahilar opacity. No pleural effusions.  IMPRESSION: Findings suggest mild cardiogenic interstitial pulmonary edema. Recommend radiographic follow-up to ensure resolution of left suprahilar opacity which likely represents  mildly asymmetric edema.   Electronically Signed   By: Esperanza Heir M.D.   On: 02/09/2014 14:33    EKG:  Initially ST with PAC;s and LVH  Then atrial flutter    ASSESSMENT AND PLAN:  Atrial flutter:  Related to ETOH and acute illness With CRF not a great candidate for NOAC.  With anemia and ongoing alcoholism not a great candidate for anticoagulation in general.  With risk of DT;s and likely hyperadrenergic tone favor beta blocker for rate control.  EF is normal.  No indication for cardioversion as he has normal EF and no chest pain. CRF:  Per renal dialysis to control volume PVD:  uniltateral with stenting but on dialysis already so not relavent Alcoholism:  Psychiatry seeing ? Inpatient referral for depression and ETOH abuse would seem appropriate   Signed: Charlton Haws 02/11/2014, 1:45 PM

## 2014-02-11 NOTE — Procedures (Signed)
I was present at this dialysis session, have reviewed the session itself and made  appropriate changes  Vinson Moselleob Keymani Glynn MD (pgr) [phone]    ([phone]) [phone] 02/11/2014, 12:03 PM

## 2014-02-11 NOTE — Progress Notes (Signed)
Albert BrasilDouglas E Berko AVW:[id number] DOB: 06/02/1960 DOA: 02/09/2014 PCP: Wilmer FloorAMPBELL, STEPHEN D., MD  Brief narrative: 53 y/o ? h/o OSA, former smoker, Diastolic HF[Prior NICM/cardiogenic shock 30% with imporvement-50% 2013], ESRD TTS, PAD, COPD, Prior R Renal artery stent, poorly controlled Htn resented from dialysis Center with a multitude of complaints Found to have Hypoxic respiratory failure, new onset A flutter, SOB with missed dialysis Nephrology and CCM consulted  Cardiology eventually consulted because of new onset atrial flutter and need for discussion of management either ablation vs rate control. He seemed to be withdrawing from ETOH with some hallucinations and was placed on CIWA protocol 02/11/14   Past medical history-As per Problem list Chart reviewed as below-   Consultants:  Ccm  nephro  Procedures:    Antibiotics:  Vanc  Ceftriaxone    Subjective  Doing fair hallucinating this am per nurse.  Off cardizem Gtt now finished full dialysis session No other c/o Wants to see psychiatrist again   Objective    Interim History:   Telemetry: Flutter relatively controlled ~ 110-120   Objective: Filed Vitals:   02/11/14 0900 02/11/14 0930 02/11/14 1000 02/11/14 1030  BP: 125/75 128/73 133/81 114/74  Pulse: 83 104 108 112  Temp:      TempSrc:      Resp: 20 26 17 20   Height:      Weight:      SpO2:  100%      Intake/Output Summary (Last 24 hours) at 02/11/14 1114 Last data filed at 02/11/14 0400  Gross per 24 hour  Intake 380.82 ml  Output      0 ml  Net 380.82 ml    Exam:  General: alert pleasant oriented nad Cardiovascular: s1 s 2 no m/r/g Respiratory: clear no added sound Abdomen: soft, nt nd  Skin no le edema  Neuro intact  Data Reviewed: Basic Metabolic Panel:  Recent Labs Lab 02/09/14 1310 02/10/14 0435 02/11/14 0758  NA 129* 139 131*  K 4.0 3.9 3.5*  CL 75* 91* 87*  CO2 13* 25 23  GLUCOSE 121* 123* 105*  BUN 86* 25* 52*    CREATININE 10.70* 4.13* 6.20*  CALCIUM 8.9 9.2 8.9  PHOS  --   --  3.1   Liver Function Tests:  Recent Labs Lab 02/09/14 1436 02/10/14 0435 02/11/14 0758  AST 39* 33  --   ALT 22 22  --   ALKPHOS 112 114  --   BILITOT 0.3 0.4  --   PROT 7.8 8.1  --   ALBUMIN 3.3* 3.4* 2.9*   No results found for this basename: LIPASE, AMYLASE,  in the last 168 hours  Recent Labs Lab 02/09/14 1425  AMMONIA 26   CBC:  Recent Labs Lab 02/09/14 1310 02/10/14 0435 02/11/14 0758  WBC 14.9* 9.1 13.7*  NEUTROABS 13.3*  --   --   HGB 9.3* 9.5* 8.8*  HCT 27.1* 26.8* 25.8*  MCV 95.8 95.7 96.3  PLT 372 312 293   Cardiac Enzymes:  Recent Labs Lab 02/09/14 1310 02/09/14 2245 02/10/14 0435 02/10/14 1040  TROPONINI 0.51* 0.62* 0.43* 0.35*   BNP: No components found with this basename: POCBNP,  CBG: No results found for this basename: GLUCAP,  in the last 168 hours  Recent Results (from the past 240 hour(s))  CULTURE, BLOOD (ROUTINE X 2)     Status: None   Collection Time    02/09/14  3:03 PM      Result Value Ref Range Status  Specimen Description BLOOD RIGHT HAND   Final   Special Requests BOTTLES DRAWN AEROBIC AND ANAEROBIC 4CC   Final   Culture  Setup Time     Final   Value: 02/09/2014 20:32     Performed at [facility]   Culture     Final   Value:        BLOOD CULTURE RECEIVED NO GROWTH TO DATE CULTURE WILL BE HELD FOR 5 DAYS BEFORE ISSUING A FINAL NEGATIVE REPORT     Performed at [facility]   Report Status PENDING   Incomplete  CULTURE, BLOOD (ROUTINE X 2)     Status: None   Collection Time    02/09/14  3:46 PM      Result Value Ref Range Status   Specimen Description BLOOD RIGHT FOREARM   Final   Special Requests BOTTLES DRAWN AEROBIC ONLY 5CC   Final   Culture  Setup Time     Final   Value: 02/09/2014 21:45     Performed at [facility]   Culture     Final   Value: STAPHYLOCOCCUS SPECIES (COAGULASE NEGATIVE)     Note: THE SIGNIFICANCE  OF ISOLATING THIS ORGANISM FROM A SINGLE SET OF BLOOD CULTURES WHEN MULTIPLE SETS ARE DRAWN IS UNCERTAIN. PLEASE NOTIFY THE MICROBIOLOGY DEPARTMENT WITHIN ONE WEEK IF SPECIATION AND SENSITIVITIES ARE REQUIRED.     Note: Gram Stain Report Called to,Read Back By and Verified With: KENYETTA EASON 02/10/14 1515 BY SMITHERSJ     Performed at [facility]   Report Status 02/11/2014 FINAL   Final  MRSA PCR SCREENING     Status: None   Collection Time    02/09/14 10:38 PM      Result Value Ref Range Status   MRSA by PCR NEGATIVE  NEGATIVE Final   Comment:            The GeneXpert MRSA Assay (FDA     approved for NASAL specimens     only), is one component of a     comprehensive MRSA colonization     surveillance program. It is not     intended to diagnose MRSA     infection nor to guide or     monitor treatment for     MRSA infections.     Studies:              All Imaging reviewed and is as per above notation   Scheduled Meds: . ALPRAZolam      . aspirin EC  325 mg Oral Daily  . budesonide-formoterol  2 puff Inhalation BID  . calcium acetate  667 mg Oral Q supper  . calcium carbonate  400 mg of elemental calcium Oral TID  . ceFEPime (MAXIPIME) IV  1 g Intravenous Q24H  . cloNIDine  0.2 mg Oral BID  . diltiazem  120 mg Oral Daily  . diltiazem  15 mg Intravenous Once  . [START ON 02/12/2014] ferric gluconate (FERRLECIT/NULECIT) IV  125 mg Intravenous Q T,Th,Sa-HD  . heparin  3,000 Units Dialysis Once in dialysis  . heparin  5,000 Units Subcutaneous 3 times per day  . HYDROcodone-acetaminophen      . isosorbide mononitrate  60 mg Oral Daily  . nicotine  14 mg Transdermal Daily  . roflumilast  500 mcg Oral Daily  . sodium chloride  3 mL Intravenous Q12H  . tiotropium  18 mcg Inhalation Daily  . vancomycin  750 mg Intravenous Q  Mon-HD   Continuous Infusions: . diltiazem (CARDIZEM) infusion Stopped (02/11/14 0500)     Assessment/Plan: Respiratory-acute hypoxic and  hypercarbic respiratory failure secondary to  A.] volume overload-patient has missed dialysis on 02/07/14 and nephrology has already seen him and he hads received dilaysis  B.] possible HCAP vs. aspiration given chronic alcoholism see below  C. COPD- continue albuterol 2 puffs every 6 hourly as well as nebulization-no current wheeze therefore hold steroids-can start is it is noted that he has a wheeze. Continue Spiriva 18 mcg daily  D. obstructive sleep apnea history of noncompliance. Will place PRN at night order Infectious disease-  Aspiration vs HCAP-. Agree with empiric coverage vancomycin cefepime for now. WBC 14-->9  Procalcitonin up to 4 but has stabilized. Sputum culture blood culture Legionella strep pneumo labs to be followed up  Rpt 2 view CXR 10/4 Transition Iv to oral clindamycin 02/12/14 Cardiac  A. Atrial flutter-2-1 block. Carotid sinus massage attempted without any change. Placed on Cardizem drip. And transtioned to home dose Cardizem Xr 120 daily.  If persistion elevated rates consult cardiology for input and need AC.  He tells me he has had this issue before and this is why he is on the cardizem.  I have asked Cardiology for opinion about Rate control and other strategies and anticoagulation B. chest pain-unclear if he actually is having pain from ischemic pain vs. pain that is MSK. Point-of-care troponin 0.5 peaked at 0.6 .echocardiogram 01/15/14 EF 65-70%. Await rpt ECHO. At this time does not need heparin. contnue 81 ASA daily C. Acute Decompensated Diastolic HF, EF 10/4 grade 1 dysfucntion heart failure with history of NICM 2012-monitor. BNP is useless in renal dysfunction. He is supposed to be on Imdur 60 mg and takes this on a when necessary. D. uncontrolled hypertension/hypertensive urgency-has missed couple doses of his medications. For now we will hold his amlodipine 10 mg,  and hydralazine 100 3 times a day. He will reimplement a slowly once Cardizem drip was in effect. We will  restart his clonidine 0.2 mg twice a day  Renal/metabolic  A.-Anion gap acidosis 41 Delta gap 2.1 -resolved with dialysis Ethanol level is less than 11 B.-end-stage renal disease Tuesday Thursday [facility] nephrology input  C.-metabolic bone disease per renal  Endocrine  Pending HbA1c  Psychiatric  Continue Xanax 0.5 3 times a day when necessary anxiety.  He does not pose a threat to himself or doesn't and denies suicidal ideation  Psychiatry consulted 10/4 ashe mentioned depression and "killing himself" Hallucinating per RN early am 10/5-Place on CIWA and librium protocol-monitor closely on SDU GI  Unclear etiology of n/v-place on Protonix po 40 bid.  Monitor for further issues  ordered phenergan  Neurology  No current issue Heme Hemoglobin 8.8 Check anemia panel Might need Aranesp per renal   Code Status: full Family Communication:  None bedside Disposition Plan:  SDU for now   Pleas Koch, MD  [facility] Pager [phone] 02/11/2014, 11:14 AM    LOS: 2 days

## 2014-02-12 DIAGNOSIS — I4892 Unspecified atrial flutter: Secondary | ICD-10-CM

## 2014-02-12 DIAGNOSIS — R079 Chest pain, unspecified: Secondary | ICD-10-CM

## 2014-02-12 LAB — CBC WITH DIFFERENTIAL/PLATELET
BASOS PCT: 0 % (ref 0–1)
Basophils Absolute: 0 10*3/uL (ref 0.0–0.1)
EOS ABS: 0.1 10*3/uL (ref 0.0–0.7)
EOS PCT: 1 % (ref 0–5)
HCT: 27.7 % — ABNORMAL LOW (ref 39.0–52.0)
Hemoglobin: 9.1 g/dL — ABNORMAL LOW (ref 13.0–17.0)
LYMPHS PCT: 10 % — AB (ref 12–46)
Lymphs Abs: 1.1 10*3/uL (ref 0.7–4.0)
MCH: 32.9 pg (ref 26.0–34.0)
MCHC: 32.9 g/dL (ref 30.0–36.0)
MCV: 100 fL (ref 78.0–100.0)
MONO ABS: 1 10*3/uL (ref 0.1–1.0)
Monocytes Relative: 10 % (ref 3–12)
Neutro Abs: 8.2 10*3/uL — ABNORMAL HIGH (ref 1.7–7.7)
Neutrophils Relative %: 79 % — ABNORMAL HIGH (ref 43–77)
PLATELETS: 288 10*3/uL (ref 150–400)
RBC: 2.77 MIL/uL — ABNORMAL LOW (ref 4.22–5.81)
RDW: 15 % (ref 11.5–15.5)
WBC: 10.4 10*3/uL (ref 4.0–10.5)

## 2014-02-12 LAB — RENAL FUNCTION PANEL
ALBUMIN: 2.8 g/dL — AB (ref 3.5–5.2)
Anion gap: 15 (ref 5–15)
BUN: 31 mg/dL — ABNORMAL HIGH (ref 6–23)
CO2: 30 meq/L (ref 19–32)
CREATININE: 4.24 mg/dL — AB (ref 0.50–1.35)
Calcium: 9.3 mg/dL (ref 8.4–10.5)
Chloride: 91 mEq/L — ABNORMAL LOW (ref 96–112)
GFR calc non Af Amer: 15 mL/min — ABNORMAL LOW (ref >90)
GFR, EST AFRICAN AMERICAN: 17 mL/min — AB (ref >90)
Glucose, Bld: 112 mg/dL — ABNORMAL HIGH (ref 70–99)
PHOSPHORUS: 3.3 mg/dL (ref 2.3–4.6)
Potassium: 3.8 mEq/L (ref 3.7–5.3)
SODIUM: 136 meq/L — AB (ref 137–147)

## 2014-02-12 MED ORDER — LORAZEPAM 0.5 MG PO TABS
ORAL_TABLET | ORAL | Status: AC
  Filled 2014-02-12: qty 2

## 2014-02-12 MED ORDER — DARBEPOETIN ALFA-POLYSORBATE 40 MCG/0.4ML IJ SOLN
INTRAMUSCULAR | Status: AC
  Filled 2014-02-12: qty 0.4

## 2014-02-12 MED ORDER — HYDROCODONE-ACETAMINOPHEN 5-325 MG PO TABS
ORAL_TABLET | ORAL | Status: AC
  Filled 2014-02-12: qty 2

## 2014-02-12 MED ORDER — KETOROLAC TROMETHAMINE 15 MG/ML IJ SOLN
15.0000 mg | Freq: Once | INTRAMUSCULAR | Status: AC
  Administered 2014-02-12: 15 mg via INTRAVENOUS
  Filled 2014-02-12: qty 1

## 2014-02-12 MED ORDER — DARBEPOETIN ALFA-POLYSORBATE 40 MCG/0.4ML IJ SOLN
40.0000 ug | INTRAMUSCULAR | Status: DC
  Administered 2014-02-12: 40 ug via INTRAVENOUS

## 2014-02-12 MED ORDER — METOCLOPRAMIDE HCL 5 MG/ML IJ SOLN
10.0000 mg | Freq: Once | INTRAMUSCULAR | Status: AC
  Administered 2014-02-12: 10 mg via INTRAVENOUS
  Filled 2014-02-12 (×2): qty 2

## 2014-02-12 MED ORDER — ESCITALOPRAM OXALATE 5 MG PO TABS
5.0000 mg | ORAL_TABLET | Freq: Every day | ORAL | Status: DC
  Administered 2014-02-12 – 2014-02-15 (×4): 5 mg via ORAL
  Filled 2014-02-12 (×4): qty 1

## 2014-02-12 MED ORDER — CLINDAMYCIN HCL 300 MG PO CAPS
300.0000 mg | ORAL_CAPSULE | Freq: Three times a day (TID) | ORAL | Status: DC
  Administered 2014-02-12 – 2014-02-16 (×10): 300 mg via ORAL
  Filled 2014-02-12 (×16): qty 1

## 2014-02-12 MED ORDER — DIPHENHYDRAMINE HCL 50 MG/ML IJ SOLN
25.0000 mg | Freq: Once | INTRAMUSCULAR | Status: AC
Start: 2014-02-12 — End: 2014-02-12
  Administered 2014-02-12: 25 mg via INTRAVENOUS
  Filled 2014-02-12: qty 1

## 2014-02-12 NOTE — Progress Notes (Signed)
[name] WGN:[id number] DOB: 08/03/1960 DOA: 02/09/2014 PCP: Wilmer FloorAMPBELL, STEPHEN D., MD  Brief narrative: 53 y/o ? h/o OSA, former smoker, Diastolic HF[Prior NICM/cardiogenic shock 30% with imporvement-50% 2013], ESRD TTS, PAD, COPD, Prior R Renal artery stent, poorly controlled Htn resented from dialysis Center with a multitude of complaints Found to have Hypoxic respiratory failure, new onset A flutter, SOB with missed dialysis Nephrology and CCM consulted  Cardiology eventually consulted because of new onset atrial flutter and need for discussion of management either ablation vs rate control. He seemed to be withdrawing from ETOH with some hallucinations and was placed on CIWA protocol 02/11/14 by psychiatry with Ativan   Past medical history-As per Problem list Chart reviewed as below-   Consultants:  Ccm  nephro  Procedures:    Antibiotics:  Vanc  Ceftriaxone    Subjective   Doing fair Headache Looks much more alert and oriented About to go for dilaysis   Objective    Interim History:   Telemetry: Flutter seems to have converted to NSR   Objective: Filed Vitals:   02/11/14 1950 02/11/14 2009 02/12/14 0025 02/12/14 0405  BP:  133/75 107/75 112/69  Pulse:  88 75 76  Temp:  97.4 F (36.3 C) 98.5 F (36.9 C) 97.4 F (36.3 C)  TempSrc:  Oral Oral Oral  Resp:  16 17 16   Height:      Weight:    66.4 kg (146 lb 6.2 oz)  SpO2: 99% 100% 99% 100%    Intake/Output Summary (Last 24 hours) at 02/12/14 0730 Last data filed at 02/11/14 2300  Gross per 24 hour  Intake    533 ml  Output   2866 ml  Net  -2333 ml    Exam:  General: alert pleasant oriented nad Cardiovascular: s1 s 2 no m/r/g Respiratory: clear no added sound Abdomen: soft, nt nd  Skin no le edema  Neuro intact  Data Reviewed: Basic Metabolic Panel:  Recent Labs Lab 02/09/14 1310 02/10/14 0435 02/11/14 0758 02/12/14 0349  NA 129* 139 131* 136*  K 4.0 3.9 3.5* 3.8  CL 75*  91* 87* 91*  CO2 13* 25 23 30   GLUCOSE 121* 123* 105* 112*  BUN 86* 25* 52* 31*  CREATININE 10.70* 4.13* 6.20* 4.24*  CALCIUM 8.9 9.2 8.9 9.3  PHOS  --   --  3.1 3.3   Liver Function Tests:  Recent Labs Lab 02/09/14 1436 02/10/14 0435 02/11/14 0758 02/12/14 0349  AST 39* 33  --   --   ALT 22 22  --   --   ALKPHOS 112 114  --   --   BILITOT 0.3 0.4  --   --   PROT 7.8 8.1  --   --   ALBUMIN 3.3* 3.4* 2.9* 2.8*   No results found for this basename: LIPASE, AMYLASE,  in the last 168 hours  Recent Labs Lab 02/09/14 1425  AMMONIA 26   CBC:  Recent Labs Lab 02/09/14 1310 02/10/14 0435 02/11/14 0758 02/12/14 0349  WBC 14.9* 9.1 13.7* 10.4  NEUTROABS 13.3*  --   --  8.2*  HGB 9.3* 9.5* 8.8* 9.1*  HCT 27.1* 26.8* 25.8* 27.7*  MCV 95.8 95.7 96.3 100.0  PLT 372 312 293 288   Cardiac Enzymes:  Recent Labs Lab 02/09/14 1310 02/09/14 2245 02/10/14 0435 02/10/14 1040  TROPONINI 0.51* 0.62* 0.43* 0.35*   BNP: No components found with this basename: POCBNP,  CBG: No results found for this basename:  GLUCAP,  in the last 168 hours  Recent Results (from the past 240 hour(s))  CULTURE, BLOOD (ROUTINE X 2)     Status: None   Collection Time    02/09/14  3:03 PM      Result Value Ref Range Status   Specimen Description BLOOD RIGHT HAND   Final   Special Requests BOTTLES DRAWN AEROBIC AND ANAEROBIC 4CC   Final   Culture  Setup Time     Final   Value: 02/09/2014 20:32     Performed at [facility]   Culture     Final   Value:        BLOOD CULTURE RECEIVED NO GROWTH TO DATE CULTURE WILL BE HELD FOR 5 DAYS BEFORE ISSUING A FINAL NEGATIVE REPORT     Performed at [facility]   Report Status PENDING   Incomplete  CULTURE, BLOOD (ROUTINE X 2)     Status: None   Collection Time    02/09/14  3:46 PM      Result Value Ref Range Status   Specimen Description BLOOD RIGHT FOREARM   Final   Special Requests BOTTLES DRAWN AEROBIC ONLY 5CC   Final   Culture   Setup Time     Final   Value: 02/09/2014 21:45     Performed at [facility]   Culture     Final   Value: STAPHYLOCOCCUS SPECIES (COAGULASE NEGATIVE)     Note: THE SIGNIFICANCE OF ISOLATING THIS ORGANISM FROM A SINGLE SET OF BLOOD CULTURES WHEN MULTIPLE SETS ARE DRAWN IS UNCERTAIN. PLEASE NOTIFY THE MICROBIOLOGY DEPARTMENT WITHIN ONE WEEK IF SPECIATION AND SENSITIVITIES ARE REQUIRED.     Note: Gram Stain Report Called to,Read Back By and Verified With: KENYETTA EASON 02/10/14 1515 BY SMITHERSJ     Performed at [facility]   Report Status 02/11/2014 FINAL   Final  MRSA PCR SCREENING     Status: None   Collection Time    02/09/14 10:38 PM      Result Value Ref Range Status   MRSA by PCR NEGATIVE  NEGATIVE Final   Comment:            The GeneXpert MRSA Assay (FDA     approved for NASAL specimens     only), is one component of a     comprehensive MRSA colonization     surveillance program. It is not     intended to diagnose MRSA     infection nor to guide or     monitor treatment for     MRSA infections.     Studies:              All Imaging reviewed and is as per above notation   Scheduled Meds: . aspirin EC  325 mg Oral Daily  . budesonide-formoterol  2 puff Inhalation BID  . calcium acetate  667 mg Oral Q supper  . calcium carbonate  400 mg of elemental calcium Oral TID  . ceFEPime (MAXIPIME) IV  1 g Intravenous Q24H  . cloNIDine  0.2 mg Oral BID  . diltiazem  120 mg Oral Daily  . ferric gluconate (FERRLECIT/NULECIT) IV  125 mg Intravenous Q T,Th,Sa-HD  . heparin  5,000 Units Subcutaneous 3 times per day  . isosorbide mononitrate  60 mg Oral Daily  . metoprolol tartrate  50 mg Oral 3 times per day  . multivitamin with minerals  1 tablet Oral Daily  . nicotine  14 mg Transdermal Daily  . roflumilast  500 mcg Oral Daily  . sodium chloride  3 mL Intravenous Q12H  . thiamine  100 mg Oral Daily  . tiotropium  18 mcg Inhalation Daily  . traZODone  25 mg Oral  QHS   Continuous Infusions:     Assessment/Plan: Respiratory-acute hypoxic and hypercarbic respiratory failure secondary to  A.] volume overload-patient has missed dialysis on 02/07/14 and nephrology has already seen him and he hads received dilaysis  B.] possible HCAP vs. aspiration given chronic alcoholism see below  C. COPD- continue albuterol 2 puffs every 6 hourly as well as nebulization-no current wheeze therefore hold steroids-can start is it is noted that he has a wheeze. Continue Spiriva 18 mcg daily  D. obstructive sleep apnea history of noncompliance. Will place PRN at night order Infectious disease-  Aspiration vs HCAP-. Agree with empiric coverage vancomycin cefepime for now. WBC 14-->9-->13-->10 Procalcitonin up to 4 but has stabilized. Sputum culture blood culture pending Transition Iv to oral clindamycin 02/12/14 Cardiac  A. Atrial flutter-2-1 block. Carotid sinus massage attempted without any change. Placed on Cardizem drip. And transtioned to home dose Cardizem Xr 120 daily. Poor AC as high risk falls/bleed c ETOH.  NOw on Metoprolol 50 Q8, Cardizem 120 daily--further input per Cardiology B. chest pain-unclear if he actually is having pain from ischemic pain vs. pain that is MSK. Point-of-care troponin 0.5 peaked at 0.6 .echocardiogram 01/15/14 EF 65-70%. Await rpt ECHO. At this time does not need heparin. continue 81 ASA daily C. Acute Decompensated Diastolic HF, EF 10/4 grade 1 dysfucntion heart failure with history of NICM 2012-monitor. BNP is useless in renal dysfunction. He is supposed to be on Imdur 60 mg and takes this on a when necessary. D. uncontrolled hypertension/hypertensive urgency-has missed couple doses of his medications. For now we will hold his amlodipine 10 mg,  and hydralazine 100 3 times a day. We will restart his clonidine 0.2 mg twice a day-also on cardizem and metoprolol as above Renal/metabolic  A.-Anion gap acidosis 41 Delta gap 2.1 -resolved with  dialysis Ethanol level is less than 11 B.-end-stage renal disease Tuesday Thursday Saturday-appreciate nephrology input-has been recieveing dialysis daily since admission C.-metabolic bone disease per renal  Endocrine  HbA1c 5.8 Psychiatric  Alcoholism-Hallucinating per RN early am 10/5-Xanax 0.5 3 times a day  when necessary anxiety per psychiatry. CIWA score still 7-12 so still need withdrawal protocol Psychiatry consulted 10/4 as he mentioned depression and "killing himself"--will need inpatient detox and management per psychiatry On lexapro 5mg , Trazadone 25 qpm and Atarax 25 q 6 prn GI  Unclear etiology of n/v-changed Protonix po 40 bid-OD 10/6.  Monitor for further issues  D/c phenergan  Neurology  No current issue Heme Hemoglobin 8.8 Check anemia panel Might need Aranesp per renal   Code Status: full Family Communication:  None bedside Disposition Plan:  SDU for now-transfer to telemetry.  Can d/c if no fevers overnight while transitioned to PO abx.  desat screen.  Monitor HR   Pleas Koch, MD  [facility] Pager [phone] 02/12/2014, 7:30 AM    LOS: 3 days

## 2014-02-12 NOTE — Evaluation (Signed)
Physical Therapy Evaluation Patient Details Name: Albert BrasilDouglas E Sweis MRN: [id number] DOB: 06/15/1960 Today's Date: 02/12/2014   History of Present Illness  53 y/o male  h/o OSA, former smoker, Diastolic HF, ESRD TTS, PAD, COPD, Prior R Renal artery stent, poorly controlled Htn presented from dialysis Center with a multitude of complaints. Found to have Hypoxic respiratory failure, new onset A flutter, SOB with missed dialysis  Clinical Impression  Patient demonstrates deficits in functional mobility as indicated below. Will need continued skilled PT to address deficits ands maximize function. Will see as indicated and progress as tolerated.     Follow Up Recommendations No PT follow up    Equipment Recommendations  None recommended by PT    Recommendations for Other Services       Precautions / Restrictions Precautions Precautions: Fall Restrictions Weight Bearing Restrictions: No      Mobility  Bed Mobility Overal bed mobility: Modified Independent             General bed mobility comments: increased time to perform  Transfers Overall transfer level: Needs assistance Equipment used: None Transfers: Sit to/from Stand;Stand Pivot Transfers Sit to Stand: Min guard Stand pivot transfers: Min guard       General transfer comment: some instability noted with standing   Ambulation/Gait Ambulation/Gait assistance: Min guard Ambulation Distance (Feet): 160 Feet Assistive device: None Gait Pattern/deviations: Decreased stride length;Narrow base of support;Trunk flexed Gait velocity: decreasec Gait velocity interpretation: Below normal speed for age/gender General Gait Details: Ambulated on oxygen, increased HR required 3 standing rest breaks, HR elevated to 149 with activity, ambulated on 2 liters SpO2 >93% during ambulation  Stairs            Wheelchair Mobility    Modified Rankin (Stroke Patients Only)       Balance Overall balance assessment: Needs  assistance                           High level balance activites: Direction changes;Turns;Head turns High Level Balance Comments: lateral instability noted, some guarding secondary to instability, increased SOB during activity             Pertinent Vitals/Pain Pain Assessment: No/denies pain    Home Living Family/patient expects to be discharged to:: Private residence Living Arrangements: Alone   Type of Home: House Home Access: Stairs to enter Entrance Stairs-Rails: Right Entrance Stairs-Number of Steps: 6 Home Layout: One level Home Equipment: Cane - single point      Prior Function Level of Independence: Independent         Comments: has some difficulty secondary to SOB lately     Hand Dominance   Dominant Hand: Right    Extremity/Trunk Assessment   Upper Extremity Assessment: Generalized weakness           Lower Extremity Assessment: Generalized weakness         Communication      Cognition Arousal/Alertness: Awake/alert Behavior During Therapy: WFL for tasks assessed/performed                        General Comments      Exercises        Assessment/Plan    PT Assessment Patient needs continued PT services  PT Diagnosis Difficulty walking;Generalized weakness   PT Problem List Decreased strength;Decreased range of motion;Decreased activity tolerance;Decreased balance;Decreased mobility;Cardiopulmonary status limiting activity  PT Treatment Interventions DME instruction;Gait training;Stair training;Functional mobility training;Therapeutic  activities;Therapeutic exercise;Balance training;Patient/family education   PT Goals (Current goals can be found in the Care Plan section) Acute Rehab PT Goals Patient Stated Goal: to get some help with ETOH problems PT Goal Formulation: With patient Time For Goal Achievement: 02/26/14 Potential to Achieve Goals: Fair    Frequency Min 3X/week   Barriers to discharge  Inaccessible home environment;Decreased caregiver support stairs to enter, no assist or support in place    Co-evaluation               End of Session Equipment Utilized During Treatment: Gait belt;Oxygen Activity Tolerance: Patient limited by fatigue;Patient tolerated treatment well Patient left: in bed;with call bell/phone within reach Nurse Communication: Mobility status         Time: 1610-9604 PT Time Calculation (min): 19 min   Charges:   PT Evaluation $Initial PT Evaluation Tier I: 1 Procedure PT Treatments $Gait Training: 8-22 mins   PT G CodesFabio Asa 02/12/2014, 3:54 PM Charlotte Crumb, PT DPT  [phone]

## 2014-02-12 NOTE — Procedures (Signed)
I was present at this dialysis session, have reviewed the session itself and made  appropriate changes  Vinson Moselleob Dailey Alberson MD (pgr) [phone]    ([phone]) [phone] 02/12/2014, 10:43 AM

## 2014-02-12 NOTE — Progress Notes (Signed)
Patient Name: [name] Date of Encounter: 02/12/2014  Active Problems:   Acute respiratory failure   Atrial flutter    Patient Profile: 53 yo male w/ hx ESRD on HD, HTN, ETOH abuse, renal stent, COPD, OSA was tx from [place] [facility] 10/03 w/ depression, CHF; hx NICM w/ EF 30%, to 50% in 2013. Cards saw 10/05 for atrial flutter.  SUBJECTIVE: Pt feels much better with HR improved. Occasional sharp chest pain, lasting < 1 minute, not exertional. None now.   OBJECTIVE Filed Vitals:   02/12/14 0830 02/12/14 0900 02/12/14 0930 02/12/14 1000  BP: 93/60 95/60 90/61  98/57  Pulse: 87 91 89 89  Temp:      TempSrc:      Resp: 18 18 17 19   Height:      Weight:      SpO2: 84%   97%    Intake/Output Summary (Last 24 hours) at 02/12/14 1050 Last data filed at 02/11/14 2300  Gross per 24 hour  Intake    533 ml  Output   2866 ml  Net  -2333 ml   Filed Weights   02/11/14 1154 02/12/14 0405 02/12/14 0753  Weight: 140 lb 14 oz (63.9 kg) 146 lb 6.2 oz (66.4 kg) 143 lb 8.3 oz (65.1 kg)    PHYSICAL EXAM General: Well developed, well nourished, male in no acute distress. Head: Normocephalic, atraumatic.  Neck: Supple without bruits, JVD slightly elevated. Lungs:  Resp regular and unlabored, mild wheezing. Heart: RRR, S1, S2, no S3, S4, or murmur; no rub. Abdomen: Soft, non-tender, non-distended, BS + x 4.  Extremities: No clubbing, cyanosis, no edema.  Neuro: Alert and oriented X 3. Moves all extremities spontaneously. Psych: Normal affect.  LABS: CBC: Recent Labs  02/09/14 1310  02/11/14 0758 02/12/14 0349  WBC 14.9*  < > 13.7* 10.4  NEUTROABS 13.3*  --   --  8.2*  HGB 9.3*  < > 8.8* 9.1*  HCT 27.1*  < > 25.8* 27.7*  MCV 95.8  < > 96.3 100.0  PLT 372  < > 293 288  < > = values in this interval not displayed. INR: Recent Labs  02/10/14 0435  INR 1.08   Basic Metabolic Panel: Recent Labs  02/11/14 0758 02/12/14 0349  NA 131* 136*  K 3.5* 3.8  CL 87*  91*  CO2 23 30  GLUCOSE 105* 112*  BUN 52* 31*  CREATININE 6.20* 4.24*  CALCIUM 8.9 9.3  PHOS 3.1 3.3   Liver Function Tests: Recent Labs  02/09/14 1436 02/10/14 0435 02/11/14 0758 02/12/14 0349  AST 39* 33  --   --   ALT 22 22  --   --   ALKPHOS 112 114  --   --   BILITOT 0.3 0.4  --   --   PROT 7.8 8.1  --   --   ALBUMIN 3.3* 3.4* 2.9* 2.8*   Cardiac Enzymes: Recent Labs  02/09/14 2245 02/10/14 0435 02/10/14 1040  TROPONINI 0.62* 0.43* 0.35*   BNP: Pro B Natriuretic peptide (BNP)  Date/Time Value Ref Range Status  02/09/2014  2:25 PM >70000.0* 0 - 125 pg/mL Final  04/12/2011  2:17 AM 6260.0* 0 - 125 pg/mL Final   Hemoglobin A1C: Recent Labs  02/09/14 1612  HGBA1C 5.2   Thyroid Function Tests: Recent Labs  02/09/14 2300  TSH 0.515   TELE:    Radiology/Studies: No results found.   Current Medications:  . aspirin EC  325 mg Oral Daily  . budesonide-formoterol  2 puff Inhalation BID  . calcium acetate  667 mg Oral Q supper  . calcium carbonate  400 mg of elemental calcium Oral TID  . clindamycin  300 mg Oral 3 times per day  . cloNIDine  0.2 mg Oral BID  . darbepoetin      . darbepoetin (ARANESP) injection - DIALYSIS  40 mcg Intravenous Q Tue-HD  . diltiazem  120 mg Oral Daily  . escitalopram  5 mg Oral Daily  . ferric gluconate (FERRLECIT/NULECIT) IV  125 mg Intravenous Q T,Th,Sa-HD  . heparin  5,000 Units Subcutaneous 3 times per day  . HYDROcodone-acetaminophen      . isosorbide mononitrate  60 mg Oral Daily  . LORazepam      . metoprolol tartrate  50 mg Oral 3 times per day  . multivitamin with minerals  1 tablet Oral Daily  . nicotine  14 mg Transdermal Daily  . roflumilast  500 mcg Oral Daily  . sodium chloride  3 mL Intravenous Q12H  . thiamine  100 mg Oral Daily  . tiotropium  18 mcg Inhalation Daily  . traZODone  25 mg Oral QHS      ASSESSMENT AND PLAN: Active Problems:   Acute respiratory failure - IM/CCM, volume management per  Renal team.    Atrial flutter - spontaneous conversion to sinus rhythm, was on IV Cardizem 10/03, changed to Cardizem CD 120 mg 10/04, metoprolol 50 mg twice a day added 10/05 to the medication regimen. May have to decrease or discontinue clonidine as SBP is 90s during HD today.    Anticoagulation - currently on DVT heparin subcutaneous. With anemia and ongoing EtOH abuse, not a great candidate for anti-coagulation in general, per Dr. Fabio Bering note 10/05.    EtOH abuse, psychiatric issues - has been seen by psychiatry who does not feel inpatient rehabilitation for his alcohol addiction is required.   Otherwise, per IM, and Renal team  Signed, Theodore Demark , PA-C 10:50 AM 02/12/2014   History and all data above reviewed. No active chest pain.  However, he does have some discomfort with caring in groceries.    Patient examined.  I agree with the findings as above.  The patient exam reveals COR:RRR  ,  Lungs: Clear  ,  Abd: Positive bowel sounds, no rebound no guarding, Ext No edema  .  All available labs, radiology testing, previous records reviewed. Agree with documented assessment and plan. Atrial flutter:  As above.  Elevated Troponin:  Probably secondary to number flutter and ESRD.  However, with his chest pain, I would suggest that he have Lexiscan Myoview which could be arranged before discharge.    Fayrene Fearing Saina Waage  6:08 PM  02/12/2014

## 2014-02-12 NOTE — Progress Notes (Signed)
Subjective:  Seen on dialysis, no complaints, looks forward to inpatient detox  Objective: Vital signs in last 24 hours: Temp:  [97.2 F (36.2 C)-98.9 F (37.2 C)] 97.2 F (36.2 C) (10/06 0753) Pulse Rate:  [75-112] 91 (10/06 0900) Resp:  [16-23] 18 (10/06 0900) BP: (93-136)/(60-86) 95/60 mmHg (10/06 0900) SpO2:  [84 %-100 %] 84 % (10/06 0830) Weight:  [63.9 kg (140 lb 14 oz)-66.4 kg (146 lb 6.2 oz)] 65.1 kg (143 lb 8.3 oz) (10/06 0753) Weight change:   Intake/Output from previous day: 10/05 0701 - 10/06 0700 In: 533 [P.O.:480; I.V.:3; IV Piggyback:50] Out: 2866  Intake/Output this shift:   Lab Results:  Recent Labs  02/11/14 0758 02/12/14 0349  WBC 13.7* 10.4  HGB 8.8* 9.1*  HCT 25.8* 27.7*  PLT 293 288   BMET:  Recent Labs  02/11/14 0758 02/12/14 0349  NA 131* 136*  K 3.5* 3.8  CL 87* 91*  CO2 23 30  GLUCOSE 105* 112*  BUN 52* 31*  CREATININE 6.20* 4.24*  CALCIUM 8.9 9.3  ALBUMIN 2.9* 2.8*   No results found for this basename: PTH,  in the last 72 hours Iron Studies: No results found for this basename: IRON, TIBC, TRANSFERRIN, FERRITIN,  in the last 72 hours  Studies/Results: No results found.  EXAM:  General appearance: Alert, in no apparent distress  Resp: CTA without rales, rhonchi, or wheezes  Cardio: Irregular rhythm, no murmur or rub  GI: + BS, soft and nontender  Extremities: No edema  Access: AVF @ LUA with BFR 400   Dialysis Orders: TTS @ [facility]  66.5 kg 4 hrs 3K/2.25Ca 400/A1.5 Heparin 3000 U AVF @ LUA  No Hectorol Aranesp 25 mcg on Tues. Venofer 100 mg x 10 (through 10/20  Assessment/Plan: 1. Acute respiratory failure - sec volume overload per chest x-ray, missed HD and alcohol intoxication; nebulizer, O2 per [place], Vancomycin & Cefepime for ? PNA.  2. Atrial flutter - new onset, asymptomatic, now PO Cardizem, Metoprolol per Cardiology.  3. ESRD - HD on TTS @ [facility], K 3.8. HD today.  4. HTN/volume - BP 128/73 now on Cardizem 120 mg qd, started  Metoprolol 50 mg q8h, continuing Clonidine 0.2 mg bid; below EDW s/p net UF 2.9 L yesterday. 5. Anemia - Hgb 9.1, Aranesp 25 mcg on Tues, Venofer 100 mg x 10. Increase Aranesp to 40 mcg.  6. Sec HPT - Ca 9.3 (10.5 corrected), P 3.3, iPTH 87; no Hectorol, Phoslo with meals.  7. Nutrition - Alb 2.8, renal diet, multivitamin.  8. Alcohol abuse - inpatient detox pending. 9. Depression - with recent suicidal ideation, seen by Psychiatry, on Lexapro.  10. COPD - tobacco abuse, but down to 2 cigarettes/day.  11. ASCVD - Hx NSTEMI, R renal artery stenosis w/ stent.  12. OSA - noncompliant with CPAP, claustrophobic. 13. Debility - ordered PT eval    LOS: 3 days   LYLES,CHARLES 02/12/2014,9:42 AM  Pt seen, examined and agree w A/P as above.  Vinson Moselleob Bary Limbach MD pager [phone]    cell [phone] 02/12/2014, 10:42 AM

## 2014-02-12 NOTE — Progress Notes (Signed)
Occupational Therapy Evaluation Patient Details Name: [name] MRN: [id number] DOB: [birth year] Today's Date: 02/12/2014    History of Present Illness 53 y/o male  h/o OSA, former smoker, Diastolic HF, ESRD TTS, PAD, COPD, Prior R Renal artery stent, poorly controlled Htn presented from dialysis Center with a multitude of complaints. Found to have Hypoxic respiratory failure, new onset A flutter, SOB with missed dialysis   Clinical Impression   PTA, pt lived alone and states that he "did not do a good job of taking care of myself". Pt stated that he wants to get help with alcohol abuse. Per psych note, recommending inpatient treatment program. Pt will benefit from OT to maximize functional level of independence and facilitate D/C to next venue of care. Will further assess cognition and incorporate energy conservation in to ADL. HR 140s at timesduring session. Nsg aware.     Follow Up Recommendations  Supervision/Assistance - 24 hour;Other (comment) (inpatient psych recommended by psych)    Equipment Recommendations  Tub/shower seat    Recommendations for Other Services Other (comment) (behavioral health)     Precautions / Restrictions Precautions Precautions: Fall Restrictions Weight Bearing Restrictions: No      Mobility Bed Mobility Overal bed mobility: Modified Independent             General bed mobility comments: increased time to perform  Transfers Overall transfer level: Needs assistance Equipment used: None Transfers: Sit to/from Stand;Stand Pivot Transfers Sit to Stand: Min guard Stand pivot transfers: Min guard       General transfer comment: some instability noted with standing     Balance Overall balance assessment: Needs assistance   Sitting balance-Leahy Scale: Good     Standing balance support: No upper extremity supported Standing balance-Leahy Scale: Fair               High level balance activites: Direction changes;Turns;Head  turns High Level Balance Comments: lateral instability noted, some guarding secondary to instability, increased SOB during activity            ADL Overall ADL's : Needs assistance/impaired         Upper Body Bathing: Set up   Lower Body Bathing: Set up;Supervison/ safety   Upper Body Dressing : Set up   Lower Body Dressing: Supervision/safety;Set up   Toilet Transfer: Min guard   Toileting- Clothing Manipulation and Hygiene: Supervision/safety;Sit to/from stand       Functional mobility during ADLs: Min guard General ADL Comments: Pt given written information on Energy conservation Began discussed modifying home to reduce energy demands `     Vision                     Perception     Praxis      Pertinent Vitals/Pain Pain Assessment: No/denies pain     Hand Dominance Right   Extremity/Trunk Assessment Upper Extremity Assessment Upper Extremity Assessment: Generalized weakness   Lower Extremity Assessment Lower Extremity Assessment: Generalized weakness   Cervical / Trunk Assessment Cervical / Trunk Assessment: Normal   Communication Communication Communication: No difficulties   Cognition Arousal/Alertness: Awake/alert Behavior During Therapy: WFL for tasks assessed/performed Overall Cognitive Status: No family/caregiver present to determine baseline cognitive functioning       Memory: Decreased short-term memory (Pt with delayed problem solving. emergent awareness. )             General Comments       Exercises       Shoulder  Instructions      Home Living Family/patient expects to be discharged to:: Private residence Living Arrangements: Alone   Type of Home: House Home Access: Stairs to enter Entergy CorporationEntrance Stairs-Number of Steps: 6 Entrance Stairs-Rails: Right Home Layout: One level     Bathroom Shower/Tub: Chief Strategy OfficerTub/shower unit   Bathroom Toilet: Standard Bathroom Accessibility: No   Home Equipment: Cane - single point           Prior Functioning/Environment Level of Independence: Independent        Comments: has some difficulty secondary to SOB lately; states he sometimes doesn't eat after dialysis due to exhaustion    OT Diagnosis: Generalized weakness;Altered mental status   OT Problem List: Decreased strength;Decreased activity tolerance;Decreased safety awareness;Impaired balance (sitting and/or standing);Decreased cognition   OT Treatment/Interventions: Self-care/ADL training;Therapeutic exercise;Energy conservation;DME and/or AE instruction;Therapeutic activities;Patient/family education;Balance training    OT Goals(Current goals can be found in the care plan section) Acute Rehab OT Goals Patient Stated Goal: to get some help with ETOH problems OT Goal Formulation: With patient Time For Goal Achievement: 02/26/14 Potential to Achieve Goals: Good  OT Frequency: Min 2X/week   Barriers to D/C: Decreased caregiver support          Co-evaluation              End of Session Nurse Communication: Mobility status;Other (comment) (HR)  Activity Tolerance: Other (comment) (Tachy dring session with HR in 140s) Patient left: in bed;with call bell/phone within reach;with bed alarm set   Time: [id number] OT Time Calculation (min): 14 min Charges:  OT General Charges $OT Visit: 1 Procedure OT Evaluation $Initial OT Evaluation Tier I: 1 Procedure OT Treatments $Self Care/Home Management : 8-22 mins G-Codes:    Oryon Gary,HILLARY 02/12/2014, 4:04 PM   [facility] Centeril[facility] Haynes Giannotti, OTR/L  [phone] 02/12/2014

## 2014-02-13 ENCOUNTER — Inpatient Hospital Stay (HOSPITAL_COMMUNITY): Payer: Medicaid Other

## 2014-02-13 DIAGNOSIS — N186 End stage renal disease: Secondary | ICD-10-CM

## 2014-02-13 DIAGNOSIS — J690 Pneumonitis due to inhalation of food and vomit: Secondary | ICD-10-CM | POA: Diagnosis present

## 2014-02-13 DIAGNOSIS — Z9989 Dependence on other enabling machines and devices: Secondary | ICD-10-CM

## 2014-02-13 DIAGNOSIS — R7989 Other specified abnormal findings of blood chemistry: Secondary | ICD-10-CM

## 2014-02-13 DIAGNOSIS — Z992 Dependence on renal dialysis: Secondary | ICD-10-CM

## 2014-02-13 DIAGNOSIS — R079 Chest pain, unspecified: Secondary | ICD-10-CM | POA: Diagnosis not present

## 2014-02-13 DIAGNOSIS — F17200 Nicotine dependence, unspecified, uncomplicated: Secondary | ICD-10-CM | POA: Diagnosis present

## 2014-02-13 DIAGNOSIS — G4733 Obstructive sleep apnea (adult) (pediatric): Secondary | ICD-10-CM | POA: Diagnosis present

## 2014-02-13 LAB — CBC WITH DIFFERENTIAL/PLATELET
BASOS ABS: 0.1 10*3/uL (ref 0.0–0.1)
BASOS PCT: 0 % (ref 0–1)
EOS PCT: 2 % (ref 0–5)
Eosinophils Absolute: 0.3 10*3/uL (ref 0.0–0.7)
HCT: 27.6 % — ABNORMAL LOW (ref 39.0–52.0)
Hemoglobin: 8.9 g/dL — ABNORMAL LOW (ref 13.0–17.0)
Lymphocytes Relative: 9 % — ABNORMAL LOW (ref 12–46)
Lymphs Abs: 1.1 10*3/uL (ref 0.7–4.0)
MCH: 32.8 pg (ref 26.0–34.0)
MCHC: 32.2 g/dL (ref 30.0–36.0)
MCV: 101.8 fL — AB (ref 78.0–100.0)
MONO ABS: 1.4 10*3/uL — AB (ref 0.1–1.0)
MONOS PCT: 11 % (ref 3–12)
NEUTROS ABS: 10.6 10*3/uL — AB (ref 1.7–7.7)
Neutrophils Relative %: 78 % — ABNORMAL HIGH (ref 43–77)
Platelets: 298 10*3/uL (ref 150–400)
RBC: 2.71 MIL/uL — ABNORMAL LOW (ref 4.22–5.81)
RDW: 15.1 % (ref 11.5–15.5)
WBC: 13.4 10*3/uL — ABNORMAL HIGH (ref 4.0–10.5)

## 2014-02-13 LAB — RENAL FUNCTION PANEL
ALBUMIN: 2.7 g/dL — AB (ref 3.5–5.2)
Anion gap: 15 (ref 5–15)
BUN: 25 mg/dL — ABNORMAL HIGH (ref 6–23)
CALCIUM: 8.7 mg/dL (ref 8.4–10.5)
CO2: 27 mEq/L (ref 19–32)
CREATININE: 3.29 mg/dL — AB (ref 0.50–1.35)
Chloride: 94 mEq/L — ABNORMAL LOW (ref 96–112)
GFR calc Af Amer: 23 mL/min — ABNORMAL LOW (ref >90)
GFR calc non Af Amer: 20 mL/min — ABNORMAL LOW (ref >90)
Glucose, Bld: 108 mg/dL — ABNORMAL HIGH (ref 70–99)
Phosphorus: 1.6 mg/dL — ABNORMAL LOW (ref 2.3–4.6)
Potassium: 3.7 mEq/L (ref 3.7–5.3)
Sodium: 136 mEq/L — ABNORMAL LOW (ref 137–147)

## 2014-02-13 MED ORDER — ALBUTEROL SULFATE (2.5 MG/3ML) 0.083% IN NEBU
2.5000 mg | INHALATION_SOLUTION | Freq: Two times a day (BID) | RESPIRATORY_TRACT | Status: DC
  Administered 2014-02-14 – 2014-02-18 (×9): 2.5 mg via RESPIRATORY_TRACT
  Filled 2014-02-13 (×9): qty 3

## 2014-02-13 MED ORDER — HEPARIN SODIUM (PORCINE) 1000 UNIT/ML DIALYSIS
3000.0000 [IU] | Freq: Once | INTRAMUSCULAR | Status: DC
  Filled 2014-02-13: qty 3

## 2014-02-13 MED ORDER — LIDOCAINE HCL (PF) 1 % IJ SOLN: 5.0000 mL | INTRAMUSCULAR | Status: DC | PRN

## 2014-02-13 MED ORDER — ALTEPLASE 2 MG IJ SOLR
2.0000 mg | Freq: Once | INTRAMUSCULAR | Status: AC | PRN
  Filled 2014-02-13: qty 2

## 2014-02-13 MED ORDER — NICOTINE 7 MG/24HR TD PT24
7.0000 mg | MEDICATED_PATCH | Freq: Every day | TRANSDERMAL | Status: DC
  Administered 2014-02-13 – 2014-02-18 (×6): 7 mg via TRANSDERMAL
  Filled 2014-02-13 (×6): qty 1

## 2014-02-13 MED ORDER — PANTOPRAZOLE SODIUM 40 MG PO TBEC
40.0000 mg | DELAYED_RELEASE_TABLET | Freq: Every day | ORAL | Status: DC
  Administered 2014-02-13 – 2014-02-18 (×6): 40 mg via ORAL
  Filled 2014-02-13 (×6): qty 1

## 2014-02-13 MED ORDER — PENTAFLUOROPROP-TETRAFLUOROETH EX AERO: 1.0000 "application " | INHALATION_SPRAY | CUTANEOUS | Status: DC | PRN

## 2014-02-13 MED ORDER — LIDOCAINE-PRILOCAINE 2.5-2.5 % EX CREA: 1.0000 "application " | TOPICAL_CREAM | CUTANEOUS | Status: DC | PRN

## 2014-02-13 MED ORDER — HEPARIN SODIUM (PORCINE) 1000 UNIT/ML DIALYSIS
1000.0000 [IU] | INTRAMUSCULAR | Status: DC | PRN
  Filled 2014-02-13: qty 1

## 2014-02-13 MED ORDER — SODIUM CHLORIDE 0.9 % IV SOLN: 100.0000 mL | INTRAVENOUS | Status: DC | PRN

## 2014-02-13 MED ORDER — NEPRO/CARBSTEADY PO LIQD: 237.0000 mL | ORAL | Status: DC | PRN

## 2014-02-13 NOTE — Progress Notes (Signed)
Subjective:  No complaints  Objective: Vital signs in last 24 hours: Temp:  [97.3 F (36.3 C)-98 F (36.7 C)] 97.3 F (36.3 C) (10/07 0700) Pulse Rate:  [81-110] 90 (10/07 0640) Resp:  [12-23] 14 (10/07 0640) BP: (90-143)/(57-81) 126/76 mmHg (10/07 0640) SpO2:  [84 %-100 %] 100 % (10/07 0801) Weight:  [64.8 kg (142 lb 13.7 oz)-66.7 kg (147 lb 0.8 oz)] 66.7 kg (147 lb 0.8 oz) (10/07 0255) Weight change: -2.2 kg (-4 lb 13.6 oz)  Intake/Output from previous day: 10/06 0701 - 10/07 0700 In: 350 [P.O.:240; IV Piggyback:110] Out: 721  Intake/Output this shift:   Lab Results:  Recent Labs  02/12/14 0349 02/13/14 0236  WBC 10.4 13.4*  HGB 9.1* 8.9*  HCT 27.7* 27.6*  PLT 288 298   BMET:   Recent Labs  02/12/14 0349 02/13/14 0236  NA 136* 136*  K 3.8 3.7  CL 91* 94*  CO2 30 27  GLUCOSE 112* 108*  BUN 31* 25*  CREATININE 4.24* 3.29*  CALCIUM 9.3 8.7  ALBUMIN 2.8* 2.7*   No results found for this basename: PTH,  in the last 72 hours Iron Studies: No results found for this basename: IRON, TIBC, TRANSFERRIN, FERRITIN,  in the last 72 hours  Studies/Results: No results found.  EXAM:  Gen: alert, in no apparent distress  Resp: CTA without rales, rhonchi, or wheezes  Cardio: regular rhythm, no murmur or rub  GI: + BS, soft and nontender  Extremities: No edema Neuro: slightly tremulous, Ox 3 Access: AVF @ LUA with BFR 400   Dialysis Orders: TTS @ [facility]  66.5 kg 4 hrs 3K/2.25Ca 400/A1.5 Heparin 3000 U AVF @ L[facility]  No Hectorol Aranesp 25 mcg on Tues. Venofer 100 mg x 10 (through 10/20  Assessment/Plan: 1. Acute resp failure / pulm edema - resolved w HD 2. HCAP on po clindamycin now 3. Atrial flutter - new onset, asymptomatic, now PO Cardizem, Metoprolol per Cardiology.  4. ESRD - on HD 5. HTN/volume - on dilt and metoprolol, at dry wt, BP's soft 6. Anemia - Hgb 9.1, Aranesp 25 mcg on Tues, Venofer 100 mg x 10. Increase Aranesp to 40 mcg.  7. Sec HPT - Ca 9.3 (10.5  corrected), P 3.3, iPTH 87; no Hectorol, Phoslo with meals.  8. Nutrition - Alb 2.8, renal diet, multivitamin.  9. Alcohol abuse - inpatient detox pending. 10. Depression - with recent suicidal ideation, seen by Psychiatry, on Lexapro.  11. COPD - tobacco abuse, but down to 2 cigarettes/day.  12. ASCVD - Hx NSTEMI, R renal artery stenosis w/ stent.  13. OSA - noncompliant with CPAP, claustrophobic. 14. Debility - PT seeing pt    Vinson Moselleob Khye Hochstetler MD (pgr) [phone]    [phone] 02/13/2014, 8:08 AM

## 2014-02-13 NOTE — Progress Notes (Signed)
Admission note:  Arrival Method: Patient arrived to unit in w/c with the staff accompanying from 3S step down unit. Mental Orientation:  Alert and oriented x 4. Telemetry: On telemetry 6E-01, NSR. Assessment: See doc flow sheets. Skin: Warm, dry and intact. IV:  Peripheral IV present on Right hand, saline locked. Pain: Voiced no pain. Fall Prevention Safety Plan: Patient educated about fall prevention safety plan, understood and acknowledged. Admission Screening: Completed. 6700 Orientation: Patient has been oriented to the unit, staff and to the room.

## 2014-02-13 NOTE — Progress Notes (Signed)
Patient Profile: 53 yo male w/ hx ESRD on HD, HTN, ETOH abuse, renal stent, COPD, OSA was tx from [facility] 10/03 w/ depression, CHF; hx NICM w/ EF 30%, to 50% in 2013. Cards saw 10/05 for atrial flutter.   Subjective: Denies current CP, dyspnea and palpitations. Although he notes frequent episodes of nocturnal left sided chest tightness.   Objective: Vital signs in last 24 hours: Temp:  [97.3 F (36.3 C)-98 F (36.7 C)] 97.3 F (36.3 C) (10/07 0700) Pulse Rate:  [81-110] 90 (10/07 0640) Resp:  [12-23] 14 (10/07 0640) BP: (93-143)/(66-81) 126/76 mmHg (10/07 0640) SpO2:  [93 %-100 %] 100 % (10/07 0801) Weight:  [142 lb 13.7 oz (64.8 kg)-147 lb 0.8 oz (66.7 kg)] 147 lb 0.8 oz (66.7 kg) (10/07 0255) Last BM Date: 02/13/14  Intake/Output from previous day: 10/06 0701 - 10/07 0700 In: 350 [P.O.:240; IV Piggyback:110] Out: 721  Intake/Output this shift: Total I/O In: 240 [P.O.:240] Out: -   Medications Current Facility-Administered Medications  Medication Dose Route Frequency Provider Last Rate Last Dose  . albuterol (PROVENTIL) (2.5 MG/3ML) 0.083% nebulizer solution 2.5 mg  2.5 mg Nebulization Q4H PRN Rhetta Mura, MD   2.5 mg at 02/13/14 0758  . aspirin EC tablet 325 mg  325 mg Oral Daily Rhetta Mura, MD   325 mg at 02/13/14 0956  . budesonide-formoterol (SYMBICORT) 80-4.5 MCG/ACT inhaler 2 puff  2 puff Inhalation BID Rhetta Mura, MD   2 puff at 02/13/14 0759  . calcium acetate (PHOSLO) capsule 667 mg  667 mg Oral Q supper Rhetta Mura, MD   667 mg at 02/12/14 1838  . calcium carbonate (TUMS - dosed in mg elemental calcium) chewable tablet 400 mg of elemental calcium  400 mg of elemental calcium Oral TID Rhetta Mura, MD   400 mg of elemental calcium at 02/13/14 0745  . clindamycin (CLEOCIN) capsule 300 mg  300 mg Oral 3 times per day Rhetta Mura, MD   300 mg at 02/13/14 0641  . darbepoetin (ARANESP) injection 40 mcg  40 mcg  Intravenous Q Tue-HD Gerome Apley, PA-C   40 mcg at 02/12/14 1022  . diltiazem (CARDIZEM CD) 24 hr capsule 120 mg  120 mg Oral Daily Rhetta Mura, MD   120 mg at 02/13/14 0956  . escitalopram (LEXAPRO) tablet 5 mg  5 mg Oral Daily Rhetta Mura, MD   5 mg at 02/13/14 0956  . ferric gluconate (NULECIT) 125 mg in sodium chloride 0.9 % 100 mL IVPB  125 mg Intravenous Q T,Th,Sa-HD Gerome Apley, PA-C   125 mg at 02/12/14 1116  . heparin injection 5,000 Units  5,000 Units Subcutaneous 3 times per day Rhetta Mura, MD   5,000 Units at 02/13/14 0641  . HYDROcodone-acetaminophen (NORCO/VICODIN) 5-325 MG per tablet 1-2 tablet  1-2 tablet Oral Q4H PRN Rhetta Mura, MD   2 tablet at 02/12/14 1838  . hydrOXYzine (ATARAX/VISTARIL) tablet 25 mg  25 mg Oral Q6H PRN Nehemiah Settle, MD   25 mg at 02/13/14 0959  . isosorbide mononitrate (IMDUR) 24 hr tablet 60 mg  60 mg Oral Daily Jinger Neighbors, NP   60 mg at 02/13/14 0956  . loperamide (IMODIUM) capsule 2-4 mg  2-4 mg Oral PRN Nehemiah Settle, MD      . LORazepam (ATIVAN) tablet 1 mg  1 mg Oral Q6H PRN Nehemiah Settle, MD   1 mg at 02/12/14 1022  . metoprolol (LOPRESSOR) tablet 50 mg  50  mg Oral 3 times per day Wendall Stade, MD   50 mg at 02/13/14 0641  . multivitamin with minerals tablet 1 tablet  1 tablet Oral Daily Nehemiah Settle, MD   1 tablet at 02/13/14 0956  . nicotine (NICODERM CQ - dosed in mg/24 hr) patch 7 mg  7 mg Transdermal Daily Calvert Cantor, MD      . nitroGLYCERIN (NITROSTAT) SL tablet 0.4 mg  0.4 mg Sublingual Q5 min PRN Jinger Neighbors, NP      . ondansetron (ZOFRAN-ODT) disintegrating tablet 4 mg  4 mg Oral Q6H PRN Nehemiah Settle, MD      . roflumilast (DALIRESP) tablet 500 mcg  500 mcg Oral Daily Rhetta Mura, MD   500 mcg at 02/13/14 0956  . sodium chloride 0.9 % injection 3 mL  3 mL Intravenous Q12H Rhetta Mura, MD   3 mL at 02/13/14 0959  . thiamine  (VITAMIN B-1) tablet 100 mg  100 mg Oral Daily Nehemiah Settle, MD   100 mg at 02/13/14 0956  . tiotropium (SPIRIVA) inhalation capsule 18 mcg  18 mcg Inhalation Daily Rhetta Mura, MD   18 mcg at 02/13/14 0759  . traZODone (DESYREL) tablet 25 mg  25 mg Oral QHS Nehemiah Settle, MD   25 mg at 02/12/14 2157    PE: General appearance: alert, cooperative and no distress Lungs: slightly deminished breath sounds bilaterally Heart: regular rate and rhythm Extremities: no LEE Pulses: 2+ and symmetric Skin: warm and dry Neurologic: Grossly normal  Lab Results:   Recent Labs  02/11/14 0758 02/12/14 0349 02/13/14 0236  WBC 13.7* 10.4 13.4*  HGB 8.8* 9.1* 8.9*  HCT 25.8* 27.7* 27.6*  PLT 293 288 298   BMET  Recent Labs  02/11/14 0758 02/12/14 0349 02/13/14 0236  NA 131* 136* 136*  K 3.5* 3.8 3.7  CL 87* 91* 94*  CO2 23 30 27   GLUCOSE 105* 112* 108*  BUN 52* 31* 25*  CREATININE 6.20* 4.24* 3.29*  CALCIUM 8.9 9.3 8.7    Assessment/Plan  Principal Problem:   Acute respiratory failure with hypoxia Active Problems:   Uncontrolled hypertension   CKD (chronic kidney disease) stage V requiring chronic dialysis   COPD (chronic obstructive pulmonary disease)   ETOH abuse   OSA (obstructive sleep apnea)   Atrial flutter   OSA on CPAP   Nicotine dependence   Chest pain   Aspiration pneumonia  1. Acute respiratory failure: IM/CCM, volume management per Renal team.   2. Atrial flutter: spontaneous conversion to sinus rhythm 10/4. NSR on telemetry. Rate in the 90s. Continue PO Cardizem and metoprolol.   3. Anticoagulation: currently on DVT heparin subcutaneous. With anemia and ongoing EtOH abuse, not a great candidate for anti-coagulation in general, per Dr. Fabio Bering note 10/05.   4. Elevated Troponin: 0.51-->06.2-->0.43 on admit. Likely elevated in the setting of atrial flutter and CKD, although patient does report frequent episodes of nocturnal left  sided chest tightness. Was seen by Dr. Antoine Poche 10/6 who recommended [facility] prior to discharge. ? Tomorrow.   5. EtOH abuse, psychiatric issues: has been seen by psychiatry who does not feel inpatient rehabilitation for his alcohol addiction is required.      LOS: 4 days    Brittainy M. Delmer Islam 02/13/2014 10:18 AM  Patient seen, examined. Available data reviewed. Agree with findings, assessment, and plan as outlined by Robbie Lis, PA-C. The patient was independently interviewed and examined. He is alert and  oriented, in no distress. Lungs are clear. Heart is regular rate and rhythm. There is no peripheral edema. Telemetry shows sinus rhythm. I agree with documentation as outlined above. Considering the patient's elevated troponin, I think a Lexiscan Myoview stress test is appropriate. Will order for tomorrow morning.  Tonny BollmanMichael Lucy Boardman, M.D. 02/13/2014 2:40 PM

## 2014-02-13 NOTE — Progress Notes (Signed)
[facility] Progress Note   [name] WUJ:[id number] DOB: [birth year] DOA: 02/09/2014 PCP: Wilmer Floor., MD  Brief narrative: [name] is a 53 y.o. male with OSA, COPD FEV1 57%,  Diastolic CHF, ESRD on HD TTS, PAD s/p renal artery stent, HTN, ETOH abuse, smoker presenting from dialysis center (did not receive dialysis) with hypoxia,dyspnea, chest pain found to be in A-flutter. He had missed his last dialysis because he was depressed and drinking. He also had some nausea and vomiting.  He underwent emergent dialysis on 10/3. Transferred to [facility] (Stepdown unit) at 10 PM for chest pain which resolved after Morphine and Nitro. Cardiology consulted on 10/5- not a candidate for NOAC due to anemia and ETOH abuse-  EF 60-65 %- no need for cardioversion.  Mild Troponin elevation noted with T max of 0.43   Subjective: Has no complaints- wants to walk around-   Assessment/Plan: Principal Problem:   Acute respiratory failure with hypoxia - due to fluid overload from missing dialysis- resolved with dialysis treatment ? HCAP vs Aspiration  currently on Clindamycin- will repeat CXR as initial CXR revealed mostly fluid overload-  Active Problems:   Atrial flutter - spontaneous conversion- not a candidate for anticoagulation due to ETOH    Chest pain -with mildly elevated Troponins- cardiology plans on Myoview    Uncontrolled hypertension - currently on Cardizem (on Amlodipine as outpt),  Imdur and Metoprolol - outpt Hydralazine and Clonidine held in favor of above    CKD (chronic kidney disease) stage V requiring chronic dialysis - dialysis per renal team  Anemia - due to CKD and Iron deficiency- Iron being replaced by renal team - check hemoccults    COPD (chronic obstructive pulmonary disease) - on Albuterol, Roflumilast, Symbicort and Spiriva    ETOH abuse - no longer in withdrawal-  - willing to go to residential to rehab    OSA (obstructive sleep apnea) -  CPAP    Nicotine dependence - nicotine patch  Code Status: Full code Family Communication:  Disposition Plan: home DVT prophylaxis: Heparin  Consultants: Psych Cardio Nephro  Procedures: ECHO  Antibiotics: Anti-infectives   Start     Dose/Rate Route Frequency Ordered Stop   02/12/14 0745  clindamycin (CLEOCIN) capsule 300 mg     300 mg Oral 3 times per day 02/12/14 0744     02/11/14 1200  vancomycin (VANCOCIN) 500 mg in sodium chloride 0.9 % 100 mL IVPB  Status:  Discontinued     500 mg 100 mL/hr over 60 Minutes Intravenous  Once 02/10/14 1435 02/11/14 0829   02/11/14 1200  vancomycin (VANCOCIN) IVPB 750 mg/150 ml premix     750 mg 150 mL/hr over 60 Minutes Intravenous Every Mon (Hemodialysis) 02/11/14 0829 02/11/14 1215   02/10/14 1500  ceFEPIme (MAXIPIME) 1 g in dextrose 5 % 50 mL IVPB  Status:  Discontinued     1 g 100 mL/hr over 30 Minutes Intravenous Every 24 hours 02/09/14 1456 02/12/14 0744   02/09/14 1500  ceFEPIme (MAXIPIME) 1 g in dextrose 5 % 50 mL IVPB     1 g 100 mL/hr over 30 Minutes Intravenous  Once 02/09/14 1446 02/09/14 1615   02/09/14 1500  vancomycin (VANCOCIN) IVPB 1000 mg/200 mL premix     1,000 mg 200 mL/hr over 60 Minutes Intravenous  Once 02/09/14 1456 02/09/14 2111         Objective: Filed Weights   02/12/14 0753 02/12/14 1207 02/13/14 0255  Weight: 65.1 kg (  143 lb 8.3 oz) 64.8 kg (142 lb 13.7 oz) 66.7 kg (147 lb 0.8 oz)    Intake/Output Summary (Last 24 hours) at 02/13/14 0932 Last data filed at 02/13/14 0801  Gross per 24 hour  Intake    590 ml  Output    721 ml  Net   -131 ml     Vitals Filed Vitals:   02/13/14 0255 02/13/14 0640 02/13/14 0700 02/13/14 0801  BP: 117/75 126/76    Pulse: 81 90    Temp: 97.5 F (36.4 C)  97.3 F (36.3 C)   TempSrc: Oral  Oral   Resp: 18 14    Height:      Weight: 66.7 kg (147 lb 0.8 oz)     SpO2: 100% 100%  100%    Exam: General: No acute respiratory distress Lungs: Clear to  auscultation bilaterally without wheezes or crackles Cardiovascular: Regular rate and rhythm without murmur gallop or rub normal S1 and S2 Abdomen: Nontender, nondistended, soft, bowel sounds positive, no rebound, no ascites, no appreciable mass Extremities: No significant cyanosis, clubbing, or edema bilateral lower extremities  Data Reviewed: Basic Metabolic Panel:  Recent Labs Lab 02/09/14 1310 02/10/14 0435 02/11/14 0758 02/12/14 0349 02/13/14 0236  NA 129* 139 131* 136* 136*  K 4.0 3.9 3.5* 3.8 3.7  CL 75* 91* 87* 91* 94*  CO2 13* 25 23 30 27   GLUCOSE 121* 123* 105* 112* 108*  BUN 86* 25* 52* 31* 25*  CREATININE 10.70* 4.13* 6.20* 4.24* 3.29*  CALCIUM 8.9 9.2 8.9 9.3 8.7  PHOS  --   --  3.1 3.3 1.6*   Liver Function Tests:  Recent Labs Lab 02/09/14 1436 02/10/14 0435 02/11/14 0758 02/12/14 0349 02/13/14 0236  AST 39* 33  --   --   --   ALT 22 22  --   --   --   ALKPHOS 112 114  --   --   --   BILITOT 0.3 0.4  --   --   --   PROT 7.8 8.1  --   --   --   ALBUMIN 3.3* 3.4* 2.9* 2.8* 2.7*   No results found for this basename: LIPASE, AMYLASE,  in the last 168 hours  Recent Labs Lab 02/09/14 1425  AMMONIA 26   CBC:  Recent Labs Lab 02/09/14 1310 02/10/14 0435 02/11/14 0758 02/12/14 0349 02/13/14 0236  WBC 14.9* 9.1 13.7* 10.4 13.4*  NEUTROABS 13.3*  --   --  8.2* 10.6*  HGB 9.3* 9.5* 8.8* 9.1* 8.9*  HCT 27.1* 26.8* 25.8* 27.7* 27.6*  MCV 95.8 95.7 96.3 100.0 101.8*  PLT 372 312 293 288 298   Cardiac Enzymes:  Recent Labs Lab 02/09/14 1310 02/09/14 2245 02/10/14 0435 02/10/14 1040  TROPONINI 0.51* 0.62* 0.43* 0.35*   BNP (last 3 results)  Recent Labs  02/09/14 1425  PROBNP >70000.0*   CBG: No results found for this basename: GLUCAP,  in the last 168 hours  Recent Results (from the past 240 hour(s))  CULTURE, BLOOD (ROUTINE X 2)     Status: None   Collection Time    02/09/14  3:03 PM      Result Value Ref Range Status   Specimen  Description BLOOD RIGHT HAND   Final   Special Requests BOTTLES DRAWN AEROBIC AND ANAEROBIC 4CC   Final   Culture  Setup Time     Final   Value: 02/09/2014 20:32     Performed at [facility]  Culture     Final   Value:        BLOOD CULTURE RECEIVED NO GROWTH TO DATE CULTURE WILL BE HELD FOR 5 DAYS BEFORE ISSUING A FINAL NEGATIVE REPORT     [facility]   Report Status PENDING   Incomplete  CULTURE, BLOOD (ROUTINE X 2)     Status: None   Collection Time    02/09/14  3:46 PM      Result Value Ref Range Status   Specimen Description BLOOD RIGHT FOREARM   Final   Special Requests BOTTLES DRAWN AEROBIC ONLY 5CC   Final   Culture  Setup Time     Final   Value: 02/09/2014 21:45     [facility]formed at [facility]   Culture     Final   Value: STAPHYLOCOCCUS SPECIES (COAGULASE NEGATIVE)     Note: THE SIGNIFICANCE OF ISOLATING THIS ORGANISM FROM A SINGLE SET OF BLOOD CULTURES WHEN MULTIPLE SETS ARE DRAWN IS UNCERTAIN. PLEASE NOTIFY THE MICROBIOLOGY DEPARTMENT WITHIN ONE WEEK IF SPECIATION AND SENSITIVITIES ARE REQUIRED.     Note: Gram Stain Report Called to,Read Back By and Verified With: KENYETTA EASON 02/10/14 1515 BY SMITHERSJ     Performed at [facility]   Report Status 02/11/2014 FINAL   Final  MRSA PCR SCREENING     Status: None   Collection Time    02/09/14 10:38 PM      Result Value Ref Range Status   MRSA by PCR NEGATIVE  NEGATIVE Final   Comment:            The GeneXpert MRSA Assay (FDA     approved for NASAL specimens     only), is one component of a     comprehensive MRSA colonization     surveillance program. It is not     intended to diagnose MRSA     infection nor to guide or     monitor treatment for     MRSA infections.     Studies:  Recent x-ray studies have been reviewed in detail by the Attending Physician  Scheduled Meds:  Scheduled Meds: . aspirin EC  325 mg Oral Daily  . budesonide-formoterol  2 puff Inhalation BID   . calcium acetate  667 mg Oral Q supper  . calcium carbonate  400 mg of elemental calcium Oral TID  . clindamycin  300 mg Oral 3 times per day  . darbepoetin (ARANESP) injection - DIALYSIS  40 mcg Intravenous Q Tue-HD  . diltiazem  120 mg Oral Daily  . escitalopram  5 mg Oral Daily  . ferric gluconate (FERRLECIT/NULECIT) IV  125 mg Intravenous Q T,Th,Sa-HD  . heparin  5,000 Units Subcutaneous 3 times per day  . isosorbide mononitrate  60 mg Oral Daily  . metoprolol tartrate  50 mg Oral 3 times per day  . multivitamin with minerals  1 tablet Oral Daily  . nicotine  14 mg Transdermal Daily  . roflumilast  500 mcg Oral Daily  . sodium chloride  3 mL Intravenous Q12H  . thiamine  100 mg Oral Daily  . tiotropium  18 mcg Inhalation Daily  . traZODone  25 mg Oral QHS   Continuous Infusions:   Time spent on care of this patient: 35 min   Paulla Mcclaskey, MD 02/13/2014, 9:32 AM  LOS: 4 days   [facility] Office  [phone] Pager - Text Page per [email or web address]  If 7PM-7AM, please contact night-coverage [email or web address]

## 2014-02-13 NOTE — Progress Notes (Signed)
Pt refusing bed alarm at this time. Provided education regarding safety and risks for falls. Pt verbalized understanding and still refused. Will continue to monitor with door open.    

## 2014-02-13 NOTE — Progress Notes (Signed)
Reviewed assessment and POC, patient progressing well, continue with current POC.  Charlotte Crumbevon Jakaylah Schlafer, PT DPT  [phone]

## 2014-02-13 NOTE — Progress Notes (Signed)
Patient ID: GUSTAVO DISPENZA, male   DOB: 10-Feb-1961, 53 y.o.   MRN: [id number] [facility] MD Progress Note  02/13/2014 2:17 PM LACEY DOTSON  MRN:  [id number]  Subjective:  Patient is seen for psych consultation follow up. Patient has been compliant with his treatment and showing clinical improvement and requesting residential substance abuse rehab treatment in [facility] hospital, where he was involved in the past for psychosocial rehab treatment.  He has been less depressed, anxious, and no tremors, shakes and hallucinations. He has no symptoms of alcohol withdrawal today. Patient is compliant with lexapro as prescribed for depression and tolerated with side effects.   He has stated that he has been depressed and self medicating with alcohol since his wife died with acute myocardial infarction about five years ago. He denies current suicidal ideation and wants to get into treatment for alcohol rehab when medically cleared. He has plans to spend time with his grand children. He has two children and lives out of the town he lives and he lives alone. He has history of attending psychosocial rehab groups in [facility] recovery in [place] and hoping to get into 28 days program in [facility] substance abuse program.   Diagnosis:   DSM5: Schizophrenia Disorders:   Obsessive-Compulsive Disorders:   Trauma-Stressor Disorders:   Substance/Addictive Disorders:  Alcohol Intoxication with Use Disorder - Moderate (F10.229) Depressive Disorders:  Major Depressive Disorder - Unspecified (296.20) Total Time spent with patient: 30 minutes  Axis I: Substance Induced Mood Disorder and Alcohol dependence and substance induced mood disorder  ADL's:  Impaired  Sleep: Fair  Appetite:  Fair  Suicidal Ideation:  Endorses suicidal ideation but no plan Homicidal Ideation:  denied AEB (as evidenced by):  Psychiatric Specialty Exam: Physical Exam  ROS  Blood pressure 137/83, pulse 95, temperature 98.2 F (36.8  C), temperature source Oral, resp. rate 18, height 5' 6"  (1.676 m), weight 66.7 kg (147 lb 0.8 oz), SpO2 95.00%.Body mass index is 23.75 kg/(m^2).  General Appearance: Casual  Eye Contact::  Good  Speech:  Clear and Coherent  Volume:  Normal  Mood:  Anxious and Depressed  Affect:  Appropriate and Congruent  Thought Process:  Coherent and Goal Directed  Orientation:  Full (Time, Place, and Person)  Thought Content:  WDL  Suicidal Thoughts:  No  Homicidal Thoughts:  No  Memory:  Immediate;   Fair Recent;   Fair  Judgement:  Intact  Insight:  Fair  Psychomotor Activity:  Decreased  Concentration:  Fair  Recall:  Good  Fund of Knowledge:Good  Language: Good  Akathisia:  NA  Handed:  Right  AIMS (if indicated):     Assets:  Communication Skills Desire for Improvement Financial Resources/Insurance Housing Leisure Time Resilience Social Support  Sleep:      Musculoskeletal: Strength & Muscle Tone: decreased Gait & Station: unable to stand Patient leans: N/A  Current Medications: Current Facility-Administered Medications  Medication Dose Route Frequency Provider Last Rate Last Dose  . albuterol (PROVENTIL) (2.5 MG/3ML) 0.083% nebulizer solution 2.5 mg  2.5 mg Nebulization Q4H PRN Nita Sells, MD   2.5 mg at 02/13/14 0758  . aspirin EC tablet 325 mg  325 mg Oral Daily Nita Sells, MD   325 mg at 02/13/14 0956  . budesonide-formoterol (SYMBICORT) 80-4.5 MCG/ACT inhaler 2 puff  2 puff Inhalation BID Nita Sells, MD   2 puff at 02/13/14 0759  . calcium acetate (PHOSLO) capsule 667 mg  667 mg Oral Q supper Nita Sells, MD  667 mg at 02/12/14 1838  . calcium carbonate (TUMS - dosed in mg elemental calcium) chewable tablet 400 mg of elemental calcium  400 mg of elemental calcium Oral TID Nita Sells, MD   400 mg of elemental calcium at 02/13/14 0745  . clindamycin (CLEOCIN) capsule 300 mg  300 mg Oral 3 times per day Nita Sells, MD    300 mg at 02/13/14 0641  . darbepoetin (ARANESP) injection 40 mcg  40 mcg Intravenous Q Tue-HD Ramiro Harvest, PA-C   40 mcg at 02/12/14 1022  . diltiazem (CARDIZEM CD) 24 hr capsule 120 mg  120 mg Oral Daily Nita Sells, MD   120 mg at 02/13/14 0956  . escitalopram (LEXAPRO) tablet 5 mg  5 mg Oral Daily Nita Sells, MD   5 mg at 02/13/14 0956  . ferric gluconate (NULECIT) 125 mg in sodium chloride 0.9 % 100 mL IVPB  125 mg Intravenous Q T,Th,Sa-HD Ramiro Harvest, PA-C   125 mg at 02/12/14 1116  . heparin injection 5,000 Units  5,000 Units Subcutaneous 3 times per day Nita Sells, MD   5,000 Units at 02/13/14 0641  . HYDROcodone-acetaminophen (NORCO/VICODIN) 5-325 MG per tablet 1-2 tablet  1-2 tablet Oral Q4H PRN Nita Sells, MD   1 tablet at 02/13/14 1248  . hydrOXYzine (ATARAX/VISTARIL) tablet 25 mg  25 mg Oral Q6H PRN Durward Parcel, MD   25 mg at 02/13/14 0959  . isosorbide mononitrate (IMDUR) 24 hr tablet 60 mg  60 mg Oral Daily Ritta Slot, NP   60 mg at 02/13/14 0956  . loperamide (IMODIUM) capsule 2-4 mg  2-4 mg Oral PRN Durward Parcel, MD      . LORazepam (ATIVAN) tablet 1 mg  1 mg Oral Q6H PRN Durward Parcel, MD   1 mg at 02/12/14 1022  . metoprolol (LOPRESSOR) tablet 50 mg  50 mg Oral 3 times per day Josue Hector, MD   50 mg at 02/13/14 0641  . multivitamin with minerals tablet 1 tablet  1 tablet Oral Daily Durward Parcel, MD   1 tablet at 02/13/14 0956  . nicotine (NICODERM CQ - dosed in mg/24 hr) patch 7 mg  7 mg Transdermal Daily Debbe Odea, MD   7 mg at 02/13/14 1136  . nitroGLYCERIN (NITROSTAT) SL tablet 0.4 mg  0.4 mg Sublingual Q5 min PRN Ritta Slot, NP      . ondansetron (ZOFRAN-ODT) disintegrating tablet 4 mg  4 mg Oral Q6H PRN Durward Parcel, MD      . pantoprazole (PROTONIX) EC tablet 40 mg  40 mg Oral Daily Debbe Odea, MD   40 mg at 02/13/14 1246  . roflumilast (DALIRESP) tablet 500  mcg  500 mcg Oral Daily Nita Sells, MD   500 mcg at 02/13/14 0956  . sodium chloride 0.9 % injection 3 mL  3 mL Intravenous Q12H Nita Sells, MD   3 mL at 02/13/14 0959  . thiamine (VITAMIN B-1) tablet 100 mg  100 mg Oral Daily Durward Parcel, MD   100 mg at 02/13/14 0956  . tiotropium (SPIRIVA) inhalation capsule 18 mcg  18 mcg Inhalation Daily Nita Sells, MD   18 mcg at 02/13/14 0759  . traZODone (DESYREL) tablet 25 mg  25 mg Oral QHS Durward Parcel, MD   25 mg at 02/12/14 2157    Lab Results:  Results for orders placed during the hospital encounter of 02/09/14 (from the past 48 hour(s))  CBC WITH DIFFERENTIAL     Status: Abnormal   Collection Time    02/12/14  3:49 AM      Result Value Ref Range   WBC 10.4  4.0 - 10.5 K/uL   RBC 2.77 (*) 4.22 - 5.81 MIL/uL   Hemoglobin 9.1 (*) 13.0 - 17.0 g/dL   HCT 27.7 (*) 39.0 - 52.0 %   MCV 100.0  78.0 - 100.0 fL   MCH 32.9  26.0 - 34.0 pg   MCHC 32.9  30.0 - 36.0 g/dL   RDW 15.0  11.5 - 15.5 %   Platelets 288  150 - 400 K/uL   Neutrophils Relative % 79 (*) 43 - 77 %   Neutro Abs 8.2 (*) 1.7 - 7.7 K/uL   Lymphocytes Relative 10 (*) 12 - 46 %   Lymphs Abs 1.1  0.7 - 4.0 K/uL   Monocytes Relative 10  3 - 12 %   Monocytes Absolute 1.0  0.1 - 1.0 K/uL   Eosinophils Relative 1  0 - 5 %   Eosinophils Absolute 0.1  0.0 - 0.7 K/uL   Basophils Relative 0  0 - 1 %   Basophils Absolute 0.0  0.0 - 0.1 K/uL  RENAL FUNCTION PANEL     Status: Abnormal   Collection Time    02/12/14  3:49 AM      Result Value Ref Range   Sodium 136 (*) 137 - 147 mEq/L   Potassium 3.8  3.7 - 5.3 mEq/L   Chloride 91 (*) 96 - 112 mEq/L   CO2 30  19 - 32 mEq/L   Glucose, Bld 112 (*) 70 - 99 mg/dL   BUN 31 (*) 6 - 23 mg/dL   Comment: DELTA CHECK NOTED   Creatinine, Ser 4.24 (*) 0.50 - 1.35 mg/dL   Calcium 9.3  8.4 - 10.5 mg/dL   Phosphorus 3.3  2.3 - 4.6 mg/dL   Albumin 2.8 (*) 3.5 - 5.2 g/dL   GFR calc non Af Amer 15  (*) >90 mL/min   GFR calc Af Amer 17 (*) >90 mL/min   Comment: (NOTE)     The eGFR has been calculated using the CKD EPI equation.     This calculation has not been validated in all clinical situations.     eGFR's persistently <90 mL/min signify possible Chronic Kidney     Disease.   Anion gap 15  5 - 15  CBC WITH DIFFERENTIAL     Status: Abnormal   Collection Time    02/13/14  2:36 AM      Result Value Ref Range   WBC 13.4 (*) 4.0 - 10.5 K/uL   RBC 2.71 (*) 4.22 - 5.81 MIL/uL   Hemoglobin 8.9 (*) 13.0 - 17.0 g/dL   HCT 27.6 (*) 39.0 - 52.0 %   MCV 101.8 (*) 78.0 - 100.0 fL   MCH 32.8  26.0 - 34.0 pg   MCHC 32.2  30.0 - 36.0 g/dL   RDW 15.1  11.5 - 15.5 %   Platelets 298  150 - 400 K/uL   Neutrophils Relative % 78 (*) 43 - 77 %   Neutro Abs 10.6 (*) 1.7 - 7.7 K/uL   Lymphocytes Relative 9 (*) 12 - 46 %   Lymphs Abs 1.1  0.7 - 4.0 K/uL   Monocytes Relative 11  3 - 12 %   Monocytes Absolute 1.4 (*) 0.1 - 1.0 K/uL   Eosinophils Relative 2  0 - 5 %  Eosinophils Absolute 0.3  0.0 - 0.7 K/uL   Basophils Relative 0  0 - 1 %   Basophils Absolute 0.1  0.0 - 0.1 K/uL  RENAL FUNCTION PANEL     Status: Abnormal   Collection Time    02/13/14  2:36 AM      Result Value Ref Range   Sodium 136 (*) 137 - 147 mEq/L   Potassium 3.7  3.7 - 5.3 mEq/L   Chloride 94 (*) 96 - 112 mEq/L   CO2 27  19 - 32 mEq/L   Glucose, Bld 108 (*) 70 - 99 mg/dL   BUN 25 (*) 6 - 23 mg/dL   Creatinine, Ser 3.29 (*) 0.50 - 1.35 mg/dL   Calcium 8.7  8.4 - 10.5 mg/dL   Phosphorus 1.6 (*) 2.3 - 4.6 mg/dL   Albumin 2.7 (*) 3.5 - 5.2 g/dL   GFR calc non Af Amer 20 (*) >90 mL/min   GFR calc Af Amer 23 (*) >90 mL/min   Comment: (NOTE)     The eGFR has been calculated using the CKD EPI equation.     This calculation has not been validated in all clinical situations.     eGFR's persistently <90 mL/min signify possible Chronic Kidney     Disease.   Anion gap 15  5 - 15    Physical Findings: AIMS:  , ,  ,  ,     CIWA:  CIWA-Ar Total: 3 COWS:     Treatment Plan Summary: Daily contact with patient to assess and evaluate symptoms and progress in treatment Medication management  Plan: ContinueTrazodone 25 mg PO Qhs for isomnia Continue ativan alcohol detox and CIWA protocol as discussed with Dr. Brett Albino Continue Lexapro 5 mg PO Qam and may increase if tolerated and has no GI side effects Refer to residential substance abuse rehab treatment Psych consult will follow up as clinically required  Medical Decision Making Problem Points:  Established problem, worsening (2), New problem, with additional work-up planned (4), Review of last therapy session (1) and Review of psycho-social stressors (1) Data Points:  Review or order clinical lab tests (1) Review and summation of old records (2) Review of medication regiment & side effects (2) Review of new medications or change in dosage (2)  I certify that inpatient services furnished can reasonably be expected to improve the patient's condition.   Salia Cangemi,JANARDHAHA R. 02/13/2014, 2:17 PM

## 2014-02-13 NOTE — Progress Notes (Signed)
Utilization review completed.  

## 2014-02-13 NOTE — Progress Notes (Signed)
Report given to Vonna KotykJay, RN,  6 east.  All questions answered.  Patient aware of transfer, states will call family when he is in new room.  No complaints.

## 2014-02-13 NOTE — Progress Notes (Signed)
Physical Therapy Treatment Patient Details Name: Albert BrasilDouglas E Paquette MRN: [id number] DOB: 11/09/1960 Today's Date: 02/13/2014    History of Present Illness 53 y/o male  h/o OSA, former smoker, Diastolic HF, ESRD TTS, PAD, COPD, Prior R Renal artery stent, poorly controlled Htn presented from dialysis Center with a multitude of complaints. Found to have Hypoxic respiratory failure, new onset A flutter, SOB with missed dialysis    PT Comments    Pt progressing towards goals. Currently, pt requires min guard for amb and stairs and supervision for transfers. Pt on RA for entire session and maintain O2 sat above 90%. Pt encouraged to stay active while sitting in chair. Requested for NSG to amb with pt if possible.   Will continue to treat and progress as tolerated.     Follow Up Recommendations  No PT follow up     Equipment Recommendations  None recommended by PT    Recommendations for Other Services       Precautions / Restrictions Precautions Precautions: Fall    Mobility  Bed Mobility               General bed mobility comments: pt received in chair  Transfers Overall transfer level: Needs assistance Equipment used: None Transfers: Sit to/from Stand Sit to Stand: Supervision         General transfer comment: pt requiring assistance for managing lines. mild instabiltiy with initial standing. On RA, O2 above 90%.  Ambulation/Gait Ambulation/Gait assistance: Min guard Ambulation Distance (Feet): 340 Feet Assistive device: None Gait Pattern/deviations: Step-through pattern;Decreased stance time - left;Drifts right/left;Antalgic     General Gait Details: ambulate on RA, maintained O2 sat above 90%. Max HR observed 122 bpm, but pt with no cardiac sx and reports feeling good. 1 standing rest break. Guarding for safety and line management.    Stairs Stairs: Yes Stairs assistance: Min guard Stair Management: One rail Right;Forwards;Step to pattern Number of Stairs:  9 General stair comments: Pt reports some anxiety about going up steps, but ok with going down. No assistance required for instability, guard for safety.  On RA, O2 maintained above 90%.  Wheelchair Mobility    Modified Rankin (Stroke Patients Only)       Balance Overall balance assessment: Needs assistance   Sitting balance-Leahy Scale: Good Sitting balance - Comments: pt able to perform seated LE therex without back support.    Standing balance support: During functional activity;No upper extremity supported Standing balance-Leahy Scale: Good               High level balance activites: Turns;Sudden stops High Level Balance Comments: pt able to turn 360 degrees without assistance, guarding provided for safety 2/2 instability.     Cognition Arousal/Alertness: Awake/alert Behavior During Therapy: WFL for tasks assessed/performed Overall Cognitive Status: No family/caregiver present to determine baseline cognitive functioning                      Exercises General Exercises - Lower Extremity Long Arc Quad: AROM;5 reps;Both;Seated Hip Flexion/Marching: AROM;Both;5 reps;Seated    General Comments General comments (skin integrity, edema, etc.): at start of session, BP 110/60; sitting after amb 121/71. pt reports needing to catch his breath upon returning to room after amb, O2 sat maintained above 90% throughout session. pt instructed to use pursed lip breathing when SOB, pt able to demonstrate. Recommended that pt walk more with NSG if possible.  Pt reports already doing LE therex when sitting in chair or in bed.  Pt reports reduced stomach discomfort after mobilizing.       Pertinent Vitals/Pain Pain Assessment: No/denies pain    Home Living                      Prior Function            PT Goals (current goals can now be found in the care plan section) Progress towards PT goals: Progressing toward goals    Frequency  Min 3X/week    PT Plan  Current plan remains appropriate    Co-evaluation             End of Session Equipment Utilized During Treatment: Gait belt Activity Tolerance: Patient tolerated treatment well Patient left: in chair;with call bell/phone within reach Nursing Communication: Mobility status; O2, BP, and HR during session.     Time: 1610-9604 PT Time Calculation (min): 18 min  Charges:                       G Codes:      Kiersten Coss 02/13/2014, 3:23 PM Cathlyn Parsons, SPT

## 2014-02-14 ENCOUNTER — Other Ambulatory Visit: Payer: Self-pay

## 2014-02-14 ENCOUNTER — Encounter (HOSPITAL_COMMUNITY): Payer: Medicaid Other

## 2014-02-14 ENCOUNTER — Inpatient Hospital Stay (HOSPITAL_COMMUNITY): Payer: Medicaid Other

## 2014-02-14 DIAGNOSIS — A0472 Enterocolitis due to Clostridium difficile, not specified as recurrent: Secondary | ICD-10-CM | POA: Diagnosis present

## 2014-02-14 DIAGNOSIS — R079 Chest pain, unspecified: Secondary | ICD-10-CM

## 2014-02-14 DIAGNOSIS — A047 Enterocolitis due to Clostridium difficile: Secondary | ICD-10-CM

## 2014-02-14 LAB — CLOSTRIDIUM DIFFICILE BY PCR: Toxigenic C. Difficile by PCR: POSITIVE — AB

## 2014-02-14 MED ORDER — REGADENOSON 0.4 MG/5ML IV SOLN
0.4000 mg | Freq: Once | INTRAVENOUS | Status: AC
  Administered 2014-02-14 (×2): 0.4 mg via INTRAVENOUS
  Filled 2014-02-14: qty 5

## 2014-02-14 MED ORDER — ALPRAZOLAM 0.5 MG PO TABS
0.5000 mg | ORAL_TABLET | Freq: Once | ORAL | Status: AC
  Administered 2014-02-14: 0.5 mg via ORAL
  Filled 2014-02-14: qty 1

## 2014-02-14 MED ORDER — TECHNETIUM TC 99M SESTAMIBI GENERIC - CARDIOLITE
10.0000 | Freq: Once | INTRAVENOUS | Status: AC | PRN
  Administered 2014-02-14: 10 via INTRAVENOUS

## 2014-02-14 MED ORDER — METOPROLOL TARTRATE 1 MG/ML IV SOLN
5.0000 mg | Freq: Once | INTRAVENOUS | Status: AC
  Administered 2014-02-14: 5 mg via INTRAVENOUS

## 2014-02-14 MED ORDER — METRONIDAZOLE 500 MG PO TABS
500.0000 mg | ORAL_TABLET | Freq: Three times a day (TID) | ORAL | Status: DC
  Administered 2014-02-14 – 2014-02-18 (×12): 500 mg via ORAL
  Filled 2014-02-14 (×18): qty 1

## 2014-02-14 MED ORDER — TECHNETIUM TC 99M SESTAMIBI GENERIC - CARDIOLITE
30.0000 | Freq: Once | INTRAVENOUS | Status: AC | PRN
  Administered 2014-02-14: 30 via INTRAVENOUS

## 2014-02-14 MED ORDER — REGADENOSON 0.4 MG/5ML IV SOLN
INTRAVENOUS | Status: AC
  Administered 2014-02-14: 0.4 mg via INTRAVENOUS
  Filled 2014-02-14: qty 5

## 2014-02-14 MED ORDER — METOPROLOL TARTRATE 25 MG PO TABS
25.0000 mg | ORAL_TABLET | Freq: Two times a day (BID) | ORAL | Status: DC
  Administered 2014-02-14 – 2014-02-18 (×8): 25 mg via ORAL
  Filled 2014-02-14 (×10): qty 1

## 2014-02-14 NOTE — Progress Notes (Signed)
[facility] Progress Note   [name] WUJ:[id number] DOB: 05/18/1960 DOA: 02/09/2014 PCP: Wilmer FloorAMPBELL, STEPHEN D., MD  Brief narrative: Albert BrasilDouglas E Nghiem is a 53 y.o. male with OSA, COPD FEV1 57%,  Diastolic CHF, ESRD on HD TTS, PAD s/p renal artery stent, HTN, ETOH abuse, smoker presenting from dialysis center (did not receive dialysis) with hypoxia,dyspnea, chest pain found to be in A-flutter. He had missed his last dialysis because he was depressed and drinking. He also had some nausea and vomiting.  He underwent emergent dialysis on 10/3. Transferred to [facility] (Stepdown unit) at 10 PM for chest pain which resolved after Morphine and Nitro. Cardiology consulted on 10/5- not a candidate for NOAC due to anemia and ETOH abuse-  EF 60-65 %- no need for cardioversion.  Mild Troponin elevation noted with T max of 0.43   Subjective: Having loose stools- positive for c diff.   Assessment/Plan: Principal Problem:   Acute respiratory failure with hypoxia - due to fluid overload from missing dialysis- resolved with dialysis treatment ? HCAP vs Aspiration  currently on Clindamycin- will d/c Clindamycin now due to C diff.   Active Problems: C diff - started having loose stools yesterday - started oral Flagyl today- follow    Atrial flutter - spontaneous conversion- not a candidate for anticoagulation due to ETOH    Chest pain -with mildly elevated Troponins-  Myoview today - f/u on results    Uncontrolled hypertension - currently on Cardizem (on Amlodipine as outpt),  Imdur and Metoprolol - outpt Hydralazine and Clonidine held in favor of above    CKD (chronic kidney disease) stage V requiring chronic dialysis - dialysis per renal team  Anemia - due to CKD and Iron deficiency- Iron being replaced by renal team - check hemoccults    COPD (chronic obstructive pulmonary disease) - on Albuterol, Roflumilast, Symbicort and Spiriva    ETOH abuse - no longer in withdrawal-  -  willing to go to residential to rehab    OSA (obstructive sleep apnea) - CPAP    Nicotine dependence - nicotine patch  Code Status: Full code Family Communication:  Disposition Plan: home DVT prophylaxis: Heparin  Consultants: Psych Cardio Nephro  Procedures: ECHO  Antibiotics: Anti-infectives   Start     Dose/Rate Route Frequency Ordered Stop   02/14/14 1130  metroNIDAZOLE (FLAGYL) tablet 500 mg     500 mg Oral 3 times per day 02/14/14 1033     02/12/14 0745  clindamycin (CLEOCIN) capsule 300 mg     300 mg Oral 3 times per day 02/12/14 0744     02/11/14 1200  vancomycin (VANCOCIN) 500 mg in sodium chloride 0.9 % 100 mL IVPB  Status:  Discontinued     500 mg 100 mL/hr over 60 Minutes Intravenous  Once 02/10/14 1435 02/11/14 0829   02/11/14 1200  vancomycin (VANCOCIN) IVPB 750 mg/150 ml premix     750 mg 150 mL/hr over 60 Minutes Intravenous Every Mon (Hemodialysis) 02/11/14 0829 02/11/14 1215   02/10/14 1500  ceFEPIme (MAXIPIME) 1 g in dextrose 5 % 50 mL IVPB  Status:  Discontinued     1 g 100 mL/hr over 30 Minutes Intravenous Every 24 hours 02/09/14 1456 02/12/14 0744   02/09/14 1500  ceFEPIme (MAXIPIME) 1 g in dextrose 5 % 50 mL IVPB     1 g 100 mL/hr over 30 Minutes Intravenous  Once 02/09/14 1446 02/09/14 1615   02/09/14 1500  vancomycin (VANCOCIN) IVPB 1000 mg/200 mL  premix     1,000 mg 200 mL/hr over 60 Minutes Intravenous  Once 02/09/14 1456 02/09/14 2111         Objective: Filed Weights   02/13/14 0255 02/13/14 1823 02/13/14 2202  Weight: 66.7 kg (147 lb 0.8 oz) 65.681 kg (144 lb 12.8 oz) 66.089 kg (145 lb 11.2 oz)    Intake/Output Summary (Last 24 hours) at 02/14/14 1347 Last data filed at 02/14/14 0900  Gross per 24 hour  Intake      0 ml  Output      0 ml  Net      0 ml     Vitals Filed Vitals:   02/14/14 1118 02/14/14 1126 02/14/14 1127 02/14/14 1129  BP: 136/61 140/81 121/78 130/65  Pulse:      Temp:      TempSrc:      Resp:       Height:      Weight:      SpO2:        Exam: General: No acute respiratory distress Lungs: Clear to auscultation bilaterally without wheezes or crackles Cardiovascular: Regular rate and rhythm without murmur gallop or rub normal S1 and S2 Abdomen: Nontender, nondistended, soft, bowel sounds positive, no rebound, no ascites, no appreciable mass Extremities: No significant cyanosis, clubbing, or edema bilateral lower extremities  Data Reviewed: Basic Metabolic Panel:  Recent Labs Lab 02/09/14 1310 02/10/14 0435 02/11/14 0758 02/12/14 0349 02/13/14 0236  NA 129* 139 131* 136* 136*  K 4.0 3.9 3.5* 3.8 3.7  CL 75* 91* 87* 91* 94*  CO2 13* 25 23 30 27   GLUCOSE 121* 123* 105* 112* 108*  BUN 86* 25* 52* 31* 25*  CREATININE 10.70* 4.13* 6.20* 4.24* 3.29*  CALCIUM 8.9 9.2 8.9 9.3 8.7  PHOS  --   --  3.1 3.3 1.6*   Liver Function Tests:  Recent Labs Lab 02/09/14 1436 02/10/14 0435 02/11/14 0758 02/12/14 0349 02/13/14 0236  AST 39* 33  --   --   --   ALT 22 22  --   --   --   ALKPHOS 112 114  --   --   --   BILITOT 0.3 0.4  --   --   --   PROT 7.8 8.1  --   --   --   ALBUMIN 3.3* 3.4* 2.9* 2.8* 2.7*   No results found for this basename: LIPASE, AMYLASE,  in the last 168 hours  Recent Labs Lab 02/09/14 1425  AMMONIA 26   CBC:  Recent Labs Lab 02/09/14 1310 02/10/14 0435 02/11/14 0758 02/12/14 0349 02/13/14 0236  WBC 14.9* 9.1 13.7* 10.4 13.4*  NEUTROABS 13.3*  --   --  8.2* 10.6*  HGB 9.3* 9.5* 8.8* 9.1* 8.9*  HCT 27.1* 26.8* 25.8* 27.7* 27.6*  MCV 95.8 95.7 96.3 100.0 101.8*  PLT 372 312 293 288 298   Cardiac Enzymes:  Recent Labs Lab 02/09/14 1310 02/09/14 2245 02/10/14 0435 02/10/14 1040  TROPONINI 0.51* 0.62* 0.43* 0.35*   BNP (last 3 results)  Recent Labs  02/09/14 1425  PROBNP >70000.0*   CBG: No results found for this basename: GLUCAP,  in the last 168 hours  Recent Results (from the past 240 hour(s))  CULTURE, BLOOD (ROUTINE X  2)     Status: None   Collection Time    02/09/14  3:03 PM      Result Value Ref Range Status   Specimen Description BLOOD RIGHT HAND   Final  Special Requests BOTTLES DRAWN AEROBIC AND ANAEROBIC 4CC   Final   Culture  Setup Time     Final   Value: 02/09/2014 20:32     Performed at [facility]   Culture     Final   Value:        BLOOD CULTURE RECEIVED NO GROWTH TO DATE CULTURE WILL BE HELD FOR 5 DAYS BEFORE ISSUING A FINAL NEGATIVE REPORT     Performed at [facility]   Report Status PENDING   Incomplete  CULTURE, BLOOD (ROUTINE X 2)     Status: None   Collection Time    02/09/14  3:46 PM      Result Value Ref Range Status   Specimen Description BLOOD RIGHT FOREARM   Final   Special Requests BOTTLES DRAWN AEROBIC ONLY 5CC   Final   Culture  Setup Time     Final   Value: 02/09/2014 21:45     Performed at [facility]   Culture     Final   Value: STAPHYLOCOCCUS SPECIES (COAGULASE NEGATIVE)     Note: THE SIGNIFICANCE OF ISOLATING THIS ORGANISM FROM A SINGLE SET OF BLOOD CULTURES WHEN MULTIPLE SETS ARE DRAWN IS UNCERTAIN. PLEASE NOTIFY THE MICROBIOLOGY DEPARTMENT WITHIN ONE WEEK IF SPECIATION AND SENSITIVITIES ARE REQUIRED.     Note: Gram Stain Report Called to,Read Back By and Verified With: KENYETTA EASON 02/10/14 1515 BY SMITHERSJ     Performed at [facility]   Report Status 02/11/2014 FINAL   Final  MRSA PCR SCREENING     Status: None   Collection Time    02/09/14 10:38 PM      Result Value Ref Range Status   MRSA by PCR NEGATIVE  NEGATIVE Final   Comment:            The GeneXpert MRSA Assay (FDA     approved for NASAL specimens     only), is one component of a     comprehensive MRSA colonization     surveillance program. It is not     intended to diagnose MRSA     infection nor to guide or     monitor treatment for     MRSA infections.  CLOSTRIDIUM DIFFICILE BY PCR     Status: Abnormal   Collection Time    02/14/14  8:05 AM      Result  Value Ref Range Status   C difficile by pcr POSITIVE (*) NEGATIVE Final   Comment: CRITICAL RESULT CALLED TO, READ BACK BY AND VERIFIED WITH:     T. HUBBARD RN 10:20 02/14/14 ([email or web address])     Studies:  Recent x-ray studies have been reviewed in detail by the Attending Physician  Scheduled Meds:  Scheduled Meds: . albuterol  2.5 mg Nebulization BID  . aspirin EC  325 mg Oral Daily  . budesonide-formoterol  2 puff Inhalation BID  . calcium acetate  667 mg Oral Q supper  . calcium carbonate  400 mg of elemental calcium Oral TID  . clindamycin  300 mg Oral 3 times per day  . darbepoetin (ARANESP) injection - DIALYSIS  40 mcg Intravenous Q Tue-HD  . diltiazem  120 mg Oral Daily  . escitalopram  5 mg Oral Daily  . ferric gluconate (FERRLECIT/NULECIT) IV  125 mg Intravenous Q T,Th,Sa-HD  . heparin  3,000 Units Dialysis Once in dialysis  . heparin  5,000 Units Subcutaneous 3 times per day  . isosorbide mononitrate  60 mg Oral Daily  . metoprolol tartrate  25 mg Oral BID  . metroNIDAZOLE  500 mg Oral 3 times per day  . multivitamin with minerals  1 tablet Oral Daily  . nicotine  7 mg Transdermal Daily  . pantoprazole  40 mg Oral Daily  . roflumilast  500 mcg Oral Daily  . sodium chloride  3 mL Intravenous Q12H  . thiamine  100 mg Oral Daily  . tiotropium  18 mcg Inhalation Daily  . traZODone  25 mg Oral QHS   Continuous Infusions:   Time spent on care of this patient: 35 min   Daniqua Campoy, MD 02/14/2014, 1:47 PM  LOS: 5 days   [facility] Office  [phone] Pager - Text Page per [email or web address]  If 7PM-7AM, please contact night-coverage [email or web address]

## 2014-02-14 NOTE — Progress Notes (Signed)
Subjective:  Believes he may be having some alcohol withdrawal, but overall feeling much better  Objective: Vital signs in last 24 hours: Temp:  [97.7 F (36.5 C)-98.4 F (36.9 C)] 98.3 F (36.8 C) (10/08 0543) Pulse Rate:  [86-102] 93 (10/08 0543) Resp:  [15-19] 15 (10/08 0543) BP: (117-137)/(52-83) 119/65 mmHg (10/08 0543) SpO2:  [95 %-100 %] 97 % (10/08 0543) Weight:  [65.681 kg (144 lb 12.8 oz)-66.089 kg (145 lb 11.2 oz)] 66.089 kg (145 lb 11.2 oz) (10/07 2202) Weight change: 0.581 kg (1 lb 4.5 oz)  Intake/Output from previous day: 10/07 0701 - 10/08 0700 In: 240 [P.O.:240] Out: -  Intake/Output this shift:   Lab Results:  Recent Labs  02/12/14 0349 02/13/14 0236  WBC 10.4 13.4*  HGB 9.1* 8.9*  HCT 27.7* 27.6*  PLT 288 298   BMET:  Recent Labs  02/12/14 0349 02/13/14 0236  NA 136* 136*  K 3.8 3.7  CL 91* 94*  CO2 30 27  GLUCOSE 112* 108*  BUN 31* 25*  CREATININE 4.24* 3.29*  CALCIUM 9.3 8.7  ALBUMIN 2.8* 2.7*   No results found for this basename: PTH,  in the last 72 hours Iron Studies: No results found for this basename: IRON, TIBC, TRANSFERRIN, FERRITIN,  in the last 72 hours  Studies/Results: Dg Chest Port 1 View  02/13/2014   CLINICAL DATA:  Hypoxia  EXAM: PORTABLE CHEST - 1 VIEW  COMPARISON:  02/09/2014  FINDINGS: Cardiomediastinal silhouette is stable. No acute infiltrate or pleural effusion. No pulmonary edema. Bony thorax is stable.  IMPRESSION: No active disease.   Electronically Signed   By: Natasha Mead M.D.   On: 02/13/2014 16:58   EXAM:  General appearance: Alert, in no apparent distress  Resp: CTA without rales, rhonchi, or wheezes  Cardio: RRR without murmur or rub  GI: + BS, soft and nontender  Extremities: No edema  Access: AVF @ LUA with + bruit  10/7 CXR no active disease  Dialysis Orders: TTS @ [facility]  66.5 kg 4 hrs 3K/2.25Ca 400/A1.5 Heparin 3000 U AVF @ [facility]  No Hectorol Aranesp 25 mcg on Tues. Venofer 100 mg x 10 (through  10/20  Assessment/Plan: 1. Acute respiratory failure - sec volume overload per chest x-ray, missed HD and alcohol intoxication; resolved. 2. Atrial flutter - new onset, asymptomatic, spontaneously converted to SR; on PO Cardizem, Metoprolol per Cardiology.  3. ESRD - HD on TTS @ [facility], K 3.7. HD today.  4. HTN/volume - BP 119/65 now on Cardizem 120 mg qd, Metoprolol 25 mg bid; wt 66.1 kg, below EDW. He was on 4 BP meds at home (amlod, clonidine, hydral, dilt); here is getting rate control meds only with dilt and MTP and BP's are now soft. Have decreased MTP to 25 bid.today. He is in NSR.   5. Anemia - Hgb 8.9, Aranesp 25 mcg on Tues, Venofer 100 mg x 10, Aranesp increased to 40 mcg.  6. Sec HPT - Ca 8.7 (9.7 corrected), P 1.6, iPTH 87; no Hectorol, Phoslo with meals.  7. Nutrition - Alb 2.7, renal diet, multivitamin.  8. Alcohol abuse - outpatient rehab pending.  9. Depression - with recent suicidal ideation, seen by Psychiatry, on Lexapro.  10. COPD - tobacco abuse, but down to 2 cigarettes/day; nicotine patch.  11. ASCVD - Hx NSTEMI, R renal artery stenosis w/ stent.  12. OSA - noncompliant with CPAP, claustrophobic.  13. Debility - started PT.    LOS: 5 days   [name],[name]  02/14/2014,8:01 AM  Pt seen, examined, agree w assess/plan as above with additions as indicated.  Vinson Moselleob Rasheida Broden MD pager [phone]    cell [phone] 02/14/2014, 11:10 AM

## 2014-02-14 NOTE — Progress Notes (Signed)
CRITICAL VALUE ALERT  Critical value received:  Stool sample C-diff positive   Date of notification:  02/14/2014  Time of notification:  1016  Critical value read back:Yes.    Nurse who received alert:  Modena Nunneryamieko Travante Knee, RN   MD notified (1st page):  Dr. Butler Denmarkizwan   Time of first page:  1020  MD notified (2nd page):  Time of second page:  Responding MD:  Dr. Butler Denmarkizwan; Flagyl prescribed.   Time MD responded:

## 2014-02-14 NOTE — Progress Notes (Signed)
Occupational Therapy Treatment and Discharge Patient Details Name: [name] MRN: [id number] DOB: 15-Feb-1961 Today's Date: 02/14/2014    History of present illness 53 y/o male  h/o OSA, former smoker, Diastolic HF, ESRD TTS, PAD, COPD, Prior R Renal artery stent, poorly controlled Htn presented from dialysis Center with a multitude of complaints. Found to have Hypoxic respiratory failure, new onset A flutter, SOB with missed dialysis   OT comments  This 53 yo male admitted with above presents to acute OT at an Independent level for BADLS. No further OT needs, we will sign off.  Follow Up Recommendations  No OT follow up    Equipment Recommendations  Tub/shower seat       Precautions / Restrictions Precautions Precautions: None Restrictions Weight Bearing Restrictions: No       Mobility Bed Mobility Overal bed mobility: Independent                Transfers Overall transfer level: Independent Equipment used: None                      ADL Overall ADL's : Independent                                       General ADL Comments: Gave pt handout and went over points that pertained to him (purse lipped breathing, bathing in cooler water--not hot-hot, sitting to do activities v. standing, not scheduling his day with back to back activities, etc). I also marked them on the sheet for him. He verbalized understanding                Cognition   Behavior During Therapy: WFL for tasks assessed/performed Overall Cognitive Status: Within Functional Limits for tasks assessed                                    Pertinent Vitals/ Pain       Pain Assessment: No/denies pain            Progress Toward Goals  OT Goals(current goals can now be found in the care plan section)  Progress towards OT goals: Goals met/education completed, patient discharged from [facility] Discharge plan needs to be updated       End of Session  Equipment Utilized During Treatment:  (None)   Activity Tolerance Patient tolerated treatment well   Patient Left  (up and about moving in his room)   Nurse Communication  (Radiology coming to get him for stress test)        Time: 2919-1660 OT Time Calculation (min): 13 min  Charges: OT General Charges $OT Visit: 1 Procedure OT Treatments $Self Care/Home Management : 8-22 mins  Almon Register [phone] 02/14/2014, 9:41 AM

## 2014-02-14 NOTE — Progress Notes (Signed)
Paged Dr. Butler Denmarkizwan to inform her pt now running sinus tach (120s-130s) on tele monitor since returning from stress test. Pt does not seem to be in any distress. Writer originally unable to give pt daily meds d/t hem tx protocol. Relayed info to hemo nurse and per her Charline Bills(Kristyn), Dr. Arta SilenceShertz wants to wait and dialyze pt on tomorrow. Once hemo tx protocol d/c'd writer will be able to give pt daily medications. Awaiting response from Dr. Butler Denmarkizwan.

## 2014-02-14 NOTE — Progress Notes (Signed)
Patient Profile: 53 yo male w/ hx ESRD on HD, HTN, ETOH abuse, renal stent, COPD, OSA was tx from [facility] 10/03 w/ depression, CHF; hx NICM w/ EF 30%, to 50% in 2013. Cards saw 10/05 for atrial flutter.   Subjective: Currently CP free but had a brief episode of left sided chest tightness earlier.   Objective: Vital signs in last 24 hours: Temp:  [97.7 F (36.5 C)-98.4 F (36.9 C)] 97.9 F (36.6 C) (10/08 0945) Pulse Rate:  [86-109] 109 (10/08 0945) Resp:  [15-19] 18 (10/08 0945) BP: (117-140)/(52-81) 130/65 mmHg (10/08 1129) SpO2:  [95 %-100 %] 98 % (10/08 0945) Weight:  [144 lb 12.8 oz (65.681 kg)-145 lb 11.2 oz (66.089 kg)] 145 lb 11.2 oz (66.089 kg) (10/07 2202) Last BM Date: 02/14/14  Intake/Output from previous day: 10/07 0701 - 10/08 0700 In: 240 [P.O.:240] Out: -  Intake/Output this shift:    Medications Current Facility-Administered Medications  Medication Dose Route Frequency Provider Last Rate Last Dose  . albuterol (PROVENTIL) (2.5 MG/3ML) 0.083% nebulizer solution 2.5 mg  2.5 mg Nebulization Q4H PRN Rhetta MuraJai-Gurmukh Samtani, MD   2.5 mg at 02/13/14 2205  . albuterol (PROVENTIL) (2.5 MG/3ML) 0.083% nebulizer solution 2.5 mg  2.5 mg Nebulization BID Calvert CantorSaima Rizwan, MD   2.5 mg at 02/14/14 0740  . aspirin EC tablet 325 mg  325 mg Oral Daily Rhetta MuraJai-Gurmukh Samtani, MD   325 mg at 02/13/14 0956  . budesonide-formoterol (SYMBICORT) 80-4.5 MCG/ACT inhaler 2 puff  2 puff Inhalation BID Rhetta MuraJai-Gurmukh Samtani, MD   2 puff at 02/14/14 0740  . calcium acetate (PHOSLO) capsule 667 mg  667 mg Oral Q supper Rhetta MuraJai-Gurmukh Samtani, MD   667 mg at 02/13/14 1707  . calcium carbonate (TUMS - dosed in mg elemental calcium) chewable tablet 400 mg of elemental calcium  400 mg of elemental calcium Oral TID Rhetta MuraJai-Gurmukh Samtani, MD   400 mg of elemental calcium at 02/13/14 2130  . clindamycin (CLEOCIN) capsule 300 mg  300 mg Oral 3 times per day Rhetta MuraJai-Gurmukh Samtani, MD   300 mg at 02/13/14  2130  . darbepoetin (ARANESP) injection 40 mcg  40 mcg Intravenous Q Tue-HD Gerome Apleyharles Lyles, PA-C   40 mcg at 02/12/14 1022  . diltiazem (CARDIZEM CD) 24 hr capsule 120 mg  120 mg Oral Daily Rhetta MuraJai-Gurmukh Samtani, MD   120 mg at 02/13/14 0956  . escitalopram (LEXAPRO) tablet 5 mg  5 mg Oral Daily Rhetta MuraJai-Gurmukh Samtani, MD   5 mg at 02/13/14 0956  . feeding supplement (NEPRO CARB STEADY) liquid 237 mL  237 mL Oral PRN Gerome Apleyharles Lyles, PA-C      . ferric gluconate (NULECIT) 125 mg in sodium chloride 0.9 % 100 mL IVPB  125 mg Intravenous Q T,Th,Sa-HD Gerome Apleyharles Lyles, PA-C   125 mg at 02/12/14 1116  . heparin injection 1,000 Units  1,000 Units Dialysis PRN Gerome Apleyharles Lyles, PA-C      . heparin injection 3,000 Units  3,000 Units Dialysis Once in dialysis Gerome Apleyharles Lyles, PA-C      . heparin injection 5,000 Units  5,000 Units Subcutaneous 3 times per day Rhetta MuraJai-Gurmukh Samtani, MD   5,000 Units at 02/14/14 0536  . HYDROcodone-acetaminophen (NORCO/VICODIN) 5-325 MG per tablet 1-2 tablet  1-2 tablet Oral Q4H PRN Rhetta MuraJai-Gurmukh Samtani, MD   2 tablet at 02/14/14 1229  . hydrOXYzine (ATARAX/VISTARIL) tablet 25 mg  25 mg Oral Q6H PRN Nehemiah SettleJanardhaha R Jonnalagadda, MD   25 mg at 02/13/14 0959  . isosorbide mononitrate (  IMDUR) 24 hr tablet 60 mg  60 mg Oral Daily Jinger Neighbors, NP   60 mg at 02/13/14 0956  . loperamide (IMODIUM) capsule 2-4 mg  2-4 mg Oral PRN Nehemiah Settle, MD      . LORazepam (ATIVAN) tablet 1 mg  1 mg Oral Q6H PRN Nehemiah Settle, MD   1 mg at 02/14/14 1229  . metoprolol tartrate (LOPRESSOR) tablet 25 mg  25 mg Oral BID Maree Krabbe, MD      . metroNIDAZOLE (FLAGYL) tablet 500 mg  500 mg Oral 3 times per day Calvert Cantor, MD      . multivitamin with minerals tablet 1 tablet  1 tablet Oral Daily Nehemiah Settle, MD   1 tablet at 02/13/14 0956  . nicotine (NICODERM CQ - dosed in mg/24 hr) patch 7 mg  7 mg Transdermal Daily Calvert Cantor, MD   7 mg at 02/13/14 1136  . nitroGLYCERIN  (NITROSTAT) SL tablet 0.4 mg  0.4 mg Sublingual Q5 min PRN Jinger Neighbors, NP      . ondansetron (ZOFRAN-ODT) disintegrating tablet 4 mg  4 mg Oral Q6H PRN Nehemiah Settle, MD      . pantoprazole (PROTONIX) EC tablet 40 mg  40 mg Oral Daily Calvert Cantor, MD   40 mg at 02/13/14 1246  . pentafluoroprop-tetrafluoroeth (GEBAUERS) aerosol 1 application  1 application Topical PRN Gerome Apley, PA-C      . roflumilast (DALIRESP) tablet 500 mcg  500 mcg Oral Daily Rhetta Mura, MD   500 mcg at 02/13/14 0956  . sodium chloride 0.9 % injection 3 mL  3 mL Intravenous Q12H Rhetta Mura, MD   3 mL at 02/13/14 2132  . thiamine (VITAMIN B-1) tablet 100 mg  100 mg Oral Daily Nehemiah Settle, MD   100 mg at 02/13/14 0956  . tiotropium (SPIRIVA) inhalation capsule 18 mcg  18 mcg Inhalation Daily Rhetta Mura, MD   18 mcg at 02/14/14 0740  . traZODone (DESYREL) tablet 25 mg  25 mg Oral QHS Nehemiah Settle, MD   25 mg at 02/13/14 2127    PE: General appearance: alert, cooperative and no distress Lungs: slightly deminished breath sounds bilaterally Heart: regular rate and rhythm Extremities: no LEE Pulses: 2+ and symmetric Skin: warm and dry Neurologic: Grossly normal  Lab Results:   Recent Labs  02/12/14 0349 02/13/14 0236  WBC 10.4 13.4*  HGB 9.1* 8.9*  HCT 27.7* 27.6*  PLT 288 298   BMET  Recent Labs  02/12/14 0349 02/13/14 0236  NA 136* 136*  K 3.8 3.7  CL 91* 94*  CO2 30 27  GLUCOSE 112* 108*  BUN 31* 25*  CREATININE 4.24* 3.29*  CALCIUM 9.3 8.7    Assessment/Plan  Principal Problem:   Acute respiratory failure with hypoxia Active Problems:   Uncontrolled hypertension   CKD (chronic kidney disease) stage V requiring chronic dialysis   COPD (chronic obstructive pulmonary disease)   ETOH abuse   OSA (obstructive sleep apnea)   Atrial flutter   OSA on CPAP   Nicotine dependence   Chest pain   Aspiration pneumonia  1. Acute  respiratory failure: IM/CCM, volume management per Renal team.   2. Atrial flutter: spontaneous conversion to sinus rhythm 10/4. Not on telemetry currently but rhythm sounds regular on ascultation. Continue PO Cardizem and metoprolol.   3. Anticoagulation: currently on DVT heparin subcutaneous. With anemia and ongoing EtOH abuse, not a great candidate for anti-coagulation  in general, per Dr. Fabio Bering note 10/05.   4. Elevated Troponin: 0.51-->06.2-->0.43 on admit. ? If related to demand ischemia from rapid afib + CKD or true Type I NSTEMI. He has complained of intermittent left sided chest discomfort. NST completed earlier today. Radiologist interpretation to follow. If abnormal, will need LHC.  5. EtOH abuse, psychiatric issues: has been seen by psychiatry who does not feel inpatient rehabilitation for his alcohol addiction is required.      LOS: 5 days    Brittainy M. Sharol Harness, PA-C 02/14/2014 12:50 PM    Patient seen, examined. Available data reviewed. Agree with findings, assessment, and plan as outlined by Robbie Lis, PA-C. Myoview reviewed:  IMPRESSION:  1. No reversible ischemia or infarction.  2. Normal left ventricular wall motion.  3. Left ventricular ejection fraction 55%  4. Low-risk stress test findings*.  No further cardiac anticipated. Agree with continued medical therapy for PAF with beta-blocker/Ca blocker. NO anticoagulation. Please call if questions. thx  Tonny Bollman, M.D. 02/14/2014 4:04 PM

## 2014-02-14 NOTE — Progress Notes (Signed)
Hemodialysis- Called to get report, RN states pt has been tachycardic since stress test. Communicated to Dr. Arlean HoppingSchertz, will dialyze tomorrow instead of today (telephone order). Orders modified to 02/15/14.

## 2014-02-15 DIAGNOSIS — F1024 Alcohol dependence with alcohol-induced mood disorder: Secondary | ICD-10-CM

## 2014-02-15 LAB — RENAL FUNCTION PANEL
ALBUMIN: 2.5 g/dL — AB (ref 3.5–5.2)
Anion gap: 22 — ABNORMAL HIGH (ref 5–15)
BUN: 64 mg/dL — AB (ref 6–23)
CALCIUM: 9 mg/dL (ref 8.4–10.5)
CO2: 19 mEq/L (ref 19–32)
Chloride: 90 mEq/L — ABNORMAL LOW (ref 96–112)
Creatinine, Ser: 9.22 mg/dL — ABNORMAL HIGH (ref 0.50–1.35)
GFR calc Af Amer: 7 mL/min — ABNORMAL LOW (ref >90)
GFR calc non Af Amer: 6 mL/min — ABNORMAL LOW (ref >90)
Glucose, Bld: 163 mg/dL — ABNORMAL HIGH (ref 70–99)
PHOSPHORUS: 2.4 mg/dL (ref 2.3–4.6)
Potassium: 3.7 mEq/L (ref 3.7–5.3)
SODIUM: 131 meq/L — AB (ref 137–147)

## 2014-02-15 LAB — CULTURE, BLOOD (ROUTINE X 2): Culture: NO GROWTH

## 2014-02-15 LAB — CBC
HEMATOCRIT: 26.7 % — AB (ref 39.0–52.0)
HEMOGLOBIN: 8.8 g/dL — AB (ref 13.0–17.0)
MCH: 32.6 pg (ref 26.0–34.0)
MCHC: 33 g/dL (ref 30.0–36.0)
MCV: 98.9 fL (ref 78.0–100.0)
Platelets: 338 10*3/uL (ref 150–400)
RBC: 2.7 MIL/uL — ABNORMAL LOW (ref 4.22–5.81)
RDW: 14.6 % (ref 11.5–15.5)
WBC: 14.7 10*3/uL — ABNORMAL HIGH (ref 4.0–10.5)

## 2014-02-15 MED ORDER — PENTAFLUOROPROP-TETRAFLUOROETH EX AERO: 1.0000 "application " | INHALATION_SPRAY | CUTANEOUS | Status: DC | PRN

## 2014-02-15 MED ORDER — NALTREXONE HCL 50 MG PO TABS
25.0000 mg | ORAL_TABLET | Freq: Every day | ORAL | Status: DC
  Administered 2014-02-15 – 2014-02-18 (×4): 25 mg via ORAL
  Filled 2014-02-15 (×4): qty 1

## 2014-02-15 MED ORDER — NEPRO/CARBSTEADY PO LIQD: 237.0000 mL | ORAL | Status: DC | PRN

## 2014-02-15 MED ORDER — HEPARIN SODIUM (PORCINE) 1000 UNIT/ML DIALYSIS
3000.0000 [IU] | Freq: Once | INTRAMUSCULAR | Status: AC
  Administered 2014-02-15: 3000 [IU] via INTRAVENOUS_CENTRAL
  Filled 2014-02-15: qty 3

## 2014-02-15 MED ORDER — SODIUM CHLORIDE 0.9 % IV SOLN: 100.0000 mL | INTRAVENOUS | Status: DC | PRN

## 2014-02-15 MED ORDER — HEPARIN SODIUM (PORCINE) 1000 UNIT/ML DIALYSIS
1000.0000 [IU] | INTRAMUSCULAR | Status: DC | PRN
  Filled 2014-02-15: qty 1

## 2014-02-15 MED ORDER — ESCITALOPRAM OXALATE 10 MG PO TABS
10.0000 mg | ORAL_TABLET | Freq: Every day | ORAL | Status: DC
  Administered 2014-02-16 – 2014-02-18 (×3): 10 mg via ORAL
  Filled 2014-02-15 (×3): qty 1

## 2014-02-15 MED ORDER — LIDOCAINE HCL (PF) 1 % IJ SOLN: 5.0000 mL | INTRAMUSCULAR | Status: DC | PRN

## 2014-02-15 MED ORDER — HYDROCODONE-ACETAMINOPHEN 5-325 MG PO TABS
ORAL_TABLET | ORAL | Status: AC
  Administered 2014-02-15: 2 via ORAL
  Filled 2014-02-15: qty 2

## 2014-02-15 MED ORDER — MORPHINE SULFATE 2 MG/ML IJ SOLN
2.0000 mg | INTRAMUSCULAR | Status: DC | PRN
  Administered 2014-02-15 – 2014-02-18 (×8): 2 mg via INTRAVENOUS
  Filled 2014-02-15 (×8): qty 1

## 2014-02-15 MED ORDER — ALTEPLASE 2 MG IJ SOLR
2.0000 mg | Freq: Once | INTRAMUSCULAR | Status: AC | PRN
  Filled 2014-02-15: qty 2

## 2014-02-15 MED ORDER — LIDOCAINE-PRILOCAINE 2.5-2.5 % EX CREA: 1.0000 "application " | TOPICAL_CREAM | CUTANEOUS | Status: DC | PRN

## 2014-02-15 MED ORDER — BOOST / RESOURCE BREEZE PO LIQD
1.0000 | Freq: Three times a day (TID) | ORAL | Status: DC
  Administered 2014-02-15 – 2014-02-18 (×9): 1 via ORAL

## 2014-02-15 MED ORDER — ALPRAZOLAM 0.5 MG PO TABS
0.5000 mg | ORAL_TABLET | Freq: Three times a day (TID) | ORAL | Status: DC | PRN
  Administered 2014-02-15 – 2014-02-18 (×7): 0.5 mg via ORAL
  Filled 2014-02-15 (×7): qty 1

## 2014-02-15 NOTE — Progress Notes (Signed)
Note: patient had episode of A-flutter yesterday afternoon- HR 150-170- given IV Metoprolol with improvement in rate.

## 2014-02-15 NOTE — Progress Notes (Signed)
    Subjective:  No chest pain or shortness of breath. He does complain of abdominal discomfort. He has been having diarrhea.  Objective:  Vital Signs in the last 24 hours: Temp:  [98.1 F (36.7 C)-98.6 F (37 C)] 98.1 F (36.7 C) (10/09 0954) Pulse Rate:  [86-103] 98 (10/09 0954) Resp:  [16] 16 (10/09 0954) BP: (103-140)/(53-81) 116/60 mmHg (10/09 0954) SpO2:  [97 %-100 %] 98 % (10/09 0954)  Intake/Output from previous day:    Physical Exam: Pt is alert and oriented, NAD HEENT: normal Neck: JVP - normal Lungs: CTA bilaterally CV: Tachycardic and regular without murmur or gallop Abd: soft, positive bowel sounds, diffuse abdominal tenderness without rebound or guarding Ext: no C/C/E Skin: warm/dry no rash  Lab Results:  Recent Labs  02/13/14 0236  WBC 13.4*  HGB 8.9*  PLT 298    Recent Labs  02/13/14 0236  NA 136*  K 3.7  CL 94*  CO2 27  GLUCOSE 108*  BUN 25*  CREATININE 3.29*   No results found for this basename: TROPONINI, CK, MB,  in the last 72 hours  Cardiac Studies: Myoview: IMPRESSION:  1. No reversible ischemia or infarction.  2. Normal left ventricular wall motion.  3. Left ventricular ejection fraction 55%  4. Low-risk stress test findings*.  Tele: Personally reviewed: Sinus rhythm/sinus tachycardia/period of multifocal atrial tachycardia  Assessment/Plan:  1. Paroxysmal atrial flutter. I have carefully reviewed the patient's telemetry over the last 24 hours. I do not see any evidence of recurrent atrial flutter. He did have a tachycardia yesterday with heart rate in the 120 beat per minute range. At that time his rhythm appeared to be multifocal atrial tachycardia. He is now in sinus rhythm at a rate of about 100 beats per minute. I suspect his tachycardia is related to underlying illness. I would continue his current cardiac medications which include Cardizem and metoprolol.  2. Elevated troponin. Suspect combination of demand  ischemia/end-stage renal disease/atrial flutter with RVR is responsible for the troponin elevation. His Myoview is low risk. No further cardiac evaluation indicated.  Other problems as per internal medicine team.  Tonny BollmanMichael Robinette Esters, M.D. 02/15/2014, 10:15 AM

## 2014-02-15 NOTE — Clinical Social Work Note (Signed)
CSW received consult 10/8 regarding substance abuse. CSW visited with patient today to talk with patient. Patient reported that he was not feeling well but was willing to talk briefly with CSW. CSW offered empathetic support and assured patient that visit would be brief. [name] reported that he does want assistance with his ETOH abuse and would like inpatient treatment. Patient provided with a list of inpatient treatment facilities. Patient is interested in [facility] as his daughter contacted [facility] and was given information on this program. CSW provided patient with a list of inpatient and outpatient substance abuse programs that included [facility].  CSW signing off as information provided to address his substance abuse. Please reconsult if other services needed.  Genelle BalVanessa Bran Aldridge, MSW, LCSW [phone]

## 2014-02-15 NOTE — Progress Notes (Signed)
[facility] Progress Note   Albert BrasilDouglas E Massey WGN:[id number] DOB: 08/08/1960 DOA: 02/09/2014 PCP: Wilmer FloorAMPBELL, STEPHEN D., MD  Brief narrative: [name] is a 53 y.o. male with OSA, COPD FEV1 57%,  Diastolic CHF, ESRD on HD TTS, PAD s/p renal artery stent, HTN, ETOH abuse, smoker presenting from dialysis center (did not receive dialysis) with hypoxia,dyspnea, chest pain found to be in A-flutter. He had missed his last dialysis because he was depressed and drinking. He also had some nausea and vomiting.  He underwent emergent dialysis on 10/3. Transferred to [facility] (Stepdown unit) at 10 PM for chest pain which resolved after Morphine and Nitro. Cardiology consulted on 10/5- not a candidate for NOAC due to anemia and ETOH abuse-  EF 60-65 %- no need for cardioversion.  Mild Troponin elevation noted with T max of 0.43   Subjective: Still having loose stools - has mid abd pain  Assessment/Plan: Principal Problem:   Acute respiratory failure with hypoxia - due to fluid overload from missing dialysis- resolved with dialysis treatment ? HCAP vs Aspiration  currently on Clindamycin- will d/c Clindamycin now due to C diff.   Active Problems: C diff - started having loose stools yesterday - started oral Flagyl - keep on clears until abd pain improves- follow    Atrial flutter - spontaneous conversion- not a candidate for anticoagulation due to ETOH  Had MAT yesterday - resolved    Chest pain -with mildly elevated Troponins- demand ischemia-  Myoview negative- see cardiology notes    Uncontrolled hypertension - currently on Cardizem (on Amlodipine as outpt),  Imdur and Metoprolol - outpt Hydralazine and Clonidine held in favor of above    CKD (chronic kidney disease) stage V requiring chronic dialysis - dialysis per renal team  Anemia - due to CKD and Iron deficiency- Iron being replaced by renal team    COPD (chronic obstructive pulmonary disease) - on Albuterol, Roflumilast,  Symbicort and Spiriva    ETOH abuse - no longer in withdrawal-  - willing to go to residential to rehab    OSA (obstructive sleep apnea) - CPAP    Nicotine dependence - nicotine patch  Code Status: Full code Family Communication:  Disposition Plan: home DVT prophylaxis: Heparin  Consultants: Psych Cardio Nephro  Procedures: ECHO  Antibiotics: Anti-infectives   Start     Dose/Rate Route Frequency Ordered Stop   02/14/14 1130  metroNIDAZOLE (FLAGYL) tablet 500 mg     500 mg Oral 3 times per day 02/14/14 1033     02/12/14 0745  clindamycin (CLEOCIN) capsule 300 mg     300 mg Oral 3 times per day 02/12/14 0744     02/11/14 1200  vancomycin (VANCOCIN) 500 mg in sodium chloride 0.9 % 100 mL IVPB  Status:  Discontinued     500 mg 100 mL/hr over 60 Minutes Intravenous  Once 02/10/14 1435 02/11/14 0829   02/11/14 1200  vancomycin (VANCOCIN) IVPB 750 mg/150 ml premix     750 mg 150 mL/hr over 60 Minutes Intravenous Every Mon (Hemodialysis) 02/11/14 0829 02/11/14 1215   02/10/14 1500  ceFEPIme (MAXIPIME) 1 g in dextrose 5 % 50 mL IVPB  Status:  Discontinued     1 g 100 mL/hr over 30 Minutes Intravenous Every 24 hours 02/09/14 1456 02/12/14 0744   02/09/14 1500  ceFEPIme (MAXIPIME) 1 g in dextrose 5 % 50 mL IVPB     1 g 100 mL/hr over 30 Minutes Intravenous  Once 02/09/14 1446 02/09/14  1615   02/09/14 1500  vancomycin (VANCOCIN) IVPB 1000 mg/200 mL premix     1,000 mg 200 mL/hr over 60 Minutes Intravenous  Once 02/09/14 1456 02/09/14 2111         Objective: Filed Weights   02/13/14 2202 02/15/14 1231 02/15/14 1453  Weight: 66.089 kg (145 lb 11.2 oz) 66.3 kg (146 lb 2.6 oz) 65.4 kg (144 lb 2.9 oz)    Intake/Output Summary (Last 24 hours) at 02/15/14 1646 Last data filed at 02/15/14 1453  Gross per 24 hour  Intake    240 ml  Output    933 ml  Net   -693 ml     Vitals Filed Vitals:   02/15/14 1244 02/15/14 1314 02/15/14 1347 02/15/14 1453  BP: 114/69 107/60  100/60 109/67  Pulse: 87 97 96 108  Temp:    98.4 F (36.9 C)  TempSrc:    Oral  Resp: 22 18 20 20   Height:      Weight:    65.4 kg (144 lb 2.9 oz)  SpO2:    96%    Exam: General: No acute respiratory distress Lungs: Clear to auscultation bilaterally without wheezes or crackles Cardiovascular: Regular rate and rhythm without murmur gallop or rub normal S1 and S2 Abdomen: Nontender, nondistended, soft, bowel sounds positive, no rebound, no ascites, no appreciable mass Extremities: No significant cyanosis, clubbing, or edema bilateral lower extremities  Data Reviewed: Basic Metabolic Panel:  Recent Labs Lab 02/10/14 0435 02/11/14 0758 02/12/14 0349 02/13/14 0236 02/15/14 1243  NA 139 131* 136* 136* 131*  K 3.9 3.5* 3.8 3.7 3.7  CL 91* 87* 91* 94* 90*  CO2 25 23 30 27 19   GLUCOSE 123* 105* 112* 108* 163*  BUN 25* 52* 31* 25* 64*  CREATININE 4.13* 6.20* 4.24* 3.29* 9.22*  CALCIUM 9.2 8.9 9.3 8.7 9.0  PHOS  --  3.1 3.3 1.6* 2.4   Liver Function Tests:  Recent Labs Lab 02/09/14 1436 02/10/14 0435 02/11/14 0758 02/12/14 0349 02/13/14 0236 02/15/14 1243  AST 39* 33  --   --   --   --   ALT 22 22  --   --   --   --   ALKPHOS 112 114  --   --   --   --   BILITOT 0.3 0.4  --   --   --   --   PROT 7.8 8.1  --   --   --   --   ALBUMIN 3.3* 3.4* 2.9* 2.8* 2.7* 2.5*   No results found for this basename: LIPASE, AMYLASE,  in the last 168 hours  Recent Labs Lab 02/09/14 1425  AMMONIA 26   CBC:  Recent Labs Lab 02/09/14 1310 02/10/14 0435 02/11/14 0758 02/12/14 0349 02/13/14 0236 02/15/14 1243  WBC 14.9* 9.1 13.7* 10.4 13.4* 14.7*  NEUTROABS 13.3*  --   --  8.2* 10.6*  --   HGB 9.3* 9.5* 8.8* 9.1* 8.9* 8.8*  HCT 27.1* 26.8* 25.8* 27.7* 27.6* 26.7*  MCV 95.8 95.7 96.3 100.0 101.8* 98.9  PLT 372 312 293 288 298 338   Cardiac Enzymes:  Recent Labs Lab 02/09/14 1310 02/09/14 2245 02/10/14 0435 02/10/14 1040  TROPONINI 0.51* 0.62* 0.43* 0.35*   BNP  (last 3 results)  Recent Labs  02/09/14 1425  PROBNP >70000.0*   CBG: No results found for this basename: GLUCAP,  in the last 168 hours  Recent Results (from the past 240 hour(s))  CULTURE, BLOOD (ROUTINE  X 2)     Status: None   Collection Time    02/09/14  3:03 PM      Result Value Ref Range Status   Specimen Description BLOOD RIGHT HAND   Final   Special Requests BOTTLES DRAWN AEROBIC AND ANAEROBIC 4CC   Final   Culture  Setup Time     Final   Value: 02/09/2014 20:32     Performed at [facility]   Culture     Final   Value: NO GROWTH 5 DAYS     Performed at [facility]   Report Status 02/15/2014 FINAL   Final  CULTURE, BLOOD (ROUTINE X 2)     Status: None   Collection Time    02/09/14  3:46 PM      Result Value Ref Range Status   Specimen Description BLOOD RIGHT FOREARM   Final   Special Requests BOTTLES DRAWN AEROBIC ONLY 5CC   Final   Culture  Setup Time     Final   Value: 02/09/2014 21:45     Performed at [facility]   Culture     Final   Value: STAPHYLOCOCCUS SPECIES (COAGULASE NEGATIVE)     Note: THE SIGNIFICANCE OF ISOLATING THIS ORGANISM FROM A SINGLE SET OF BLOOD CULTURES WHEN MULTIPLE SETS ARE DRAWN IS UNCERTAIN. PLEASE NOTIFY THE MICROBIOLOGY DEPARTMENT WITHIN ONE WEEK IF SPECIATION AND SENSITIVITIES ARE REQUIRED.     Note: Gram Stain Report Called to,Read Back By and Verified With: KENYETTA EASON 02/10/14 1515 BY SMITHERSJ     Performed at [facility]   Report Status 02/11/2014 FINAL   Final  MRSA PCR SCREENING     Status: None   Collection Time    02/09/14 10:38 PM      Result Value Ref Range Status   MRSA by PCR NEGATIVE  NEGATIVE Final   Comment:            The GeneXpert MRSA Assay (FDA     approved for NASAL specimens     only), is one component of a     comprehensive MRSA colonization     surveillance program. It is not     intended to diagnose MRSA     infection nor to guide or     monitor treatment for      MRSA infections.  CLOSTRIDIUM DIFFICILE BY PCR     Status: Abnormal   Collection Time    02/14/14  8:05 AM      Result Value Ref Range Status   C difficile by pcr POSITIVE (*) NEGATIVE Final   Comment: CRITICAL RESULT CALLED TO, READ BACK BY AND VERIFIED WITH:     T. HUBBARD RN 10:20 02/14/14 ([email or web address])     Studies:  Recent x-ray studies have been reviewed in detail by the Attending Physician  Scheduled Meds:  Scheduled Meds: . albuterol  2.5 mg Nebulization BID  . aspirin EC  325 mg Oral Daily  . budesonide-formoterol  2 puff Inhalation BID  . calcium acetate  667 mg Oral Q supper  . calcium carbonate  400 mg of elemental calcium Oral TID  . clindamycin  300 mg Oral 3 times per day  . darbepoetin (ARANESP) injection - DIALYSIS  40 mcg Intravenous Q Tue-HD  . diltiazem  120 mg Oral Daily  . [START ON 02/16/2014] escitalopram  10 mg Oral Daily  . feeding supplement (RESOURCE BREEZE)  1 Container Oral TID BM  . ferric gluconate (  FERRLECIT/NULECIT) IV  125 mg Intravenous Q T,Th,Sa-HD  . heparin  5,000 Units Subcutaneous 3 times per day  . isosorbide mononitrate  60 mg Oral Daily  . metoprolol tartrate  25 mg Oral BID  . metroNIDAZOLE  500 mg Oral 3 times per day  . multivitamin with minerals  1 tablet Oral Daily  . naltrexone  25 mg Oral Daily  . nicotine  7 mg Transdermal Daily  . pantoprazole  40 mg Oral Daily  . roflumilast  500 mcg Oral Daily  . sodium chloride  3 mL Intravenous Q12H  . thiamine  100 mg Oral Daily  . tiotropium  18 mcg Inhalation Daily  . traZODone  25 mg Oral QHS   Continuous Infusions:   Time spent on care of this patient: 35 min   Jabree Rebert, MD 02/15/2014, 4:46 PM  LOS: 6 days   [facility] Office  [phone] Pager - Text Page per [email or web address]  If 7PM-7AM, please contact night-coverage [email or web address]

## 2014-02-15 NOTE — Progress Notes (Signed)
PT Cancellation Note  Patient Details Name: [name] MRN: [id number] DOB: 04/15/1961   Cancelled Treatment:    Reason Eval/Treat Not Completed: Other (comment) (had HD earlier, exhausted).    Thanks,    Rollene Rotundaebecca B. Voula Waln, PT, DPT [phone]   02/15/2014, 3:47 PM

## 2014-02-15 NOTE — Progress Notes (Signed)
Patient ID: [name], male   DOB: 08/08/1960, 53 y.o.   MRN: [id number] [facility] MD Progress Note  02/15/2014 12:10 PM [name]  MRN:  [id number]  Subjective:  Patient is for psychiatric consultation follow up. Patient complained feeling not good due to having tremors, shakes, may be alcohol related or manipulative behaviors, stomach pain due stomach infection. He has stated that he is having severe craving for alcohol and insist that he won't be able to go back home with fear of immediate relapse of alcohol dependence. He has compliant with his medication treatment and hemodialysis without incidents. He has been depressed and self medicating with alcohol since his wife died with acute myocardial infarction about five years ago. He admits suicidal ideation at admission but not denied suicide thoughts, intention or plans at this time. He spoke with his older daughter who visited him and has plans to spend time with his grand children. He has two children and lives out of the town he lives and he lives alone. He has history of attending psychosocial rehab groups in [facility] recovery in [place] and hoping to get into 28 days program in [place] [facility] substance abuse program.    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 rate 16, height 5\' 7"  (1.702 m), weight 66.089 kg (145 lb 11.2 oz), SpO2 98.00%.Body mass index is  22.81 kg/(m^2).  General Appearance: Casual  Eye Contact::  Good  Speech:  Clear and Coherent  Volume:  Normal  Mood:  Anxious  Affect:  Appropriate and Congruent  Thought Process:  Coherent and Goal Directed  Orientation:  Full (Time, Place, and Person)  Thought Content:  WDL  Suicidal Thoughts:  No  Homicidal Thoughts:  No  Memory:  Immediate;   Fair Recent;   Fair  Judgement:  Intact  Insight:  Fair  Psychomotor Activity:  Decreased  Concentration:  Fair  Recall:  Good  Fund of Knowledge:Good  Language: Good  Akathisia:  NA  Handed:  Right  AIMS (if indicated):     Assets:  Communication Skills Desire for Improvement Financial Resources/Insurance Housing Leisure Time Resilience Social Support  Sleep:      Musculoskeletal: Strength & Muscle Tone: decreased Gait & Station: unable to stand Patient leans: N/A  Current Medications: Current Facility-Administered Medications  Medication Dose Route Frequency Provider Last Rate Last Dose  . albuterol (PROVENTIL) (2.5 MG/3ML) 0.083% nebulizer solution 2.5 mg  2.5 mg Nebulization Q4H PRN Rhetta Mura, MD   2.5 mg at 02/13/14 2205  . albuterol (PROVENTIL) (2.5 MG/3ML) 0.083% nebulizer solution 2.5 mg  2.5 mg Nebulization BID Calvert Cantor, MD   2.5 mg at 02/15/14 0728  . ALPRAZolam Prudy Feeler) tablet 0.5 mg  0.5 mg Oral TID PRN Calvert Cantor, MD   0.5 mg at 02/15/14 0845  . aspirin EC tablet 325 mg  325 mg Oral Daily Rhetta Mura, MD   325 mg at 02/15/14 1020  . budesonide-formoterol (SYMBICORT) 80-4.5 MCG/ACT inhaler 2 puff  2 puff Inhalation BID Rhetta MuraJai-Gurmukh Samtani, MD   2 puff at 02/15/14 0729  . calcium acetate (PHOSLO) capsule 667 mg  667 mg Oral Q supper Rhetta MuraJai-Gurmukh Samtani, MD   667 mg at 02/14/14 1836  . calcium carbonate (TUMS - dosed in mg elemental calcium) chewable tablet 400 mg of elemental calcium  400 mg of elemental calcium Oral TID Rhetta MuraJai-Gurmukh Samtani, MD   400 mg of elemental calcium at 02/14/14 2154   . clindamycin (CLEOCIN) capsule 300 mg  300 mg Oral 3 times per day Rhetta MuraJai-Gurmukh Samtani, MD   300 mg at 02/15/14 0605  . darbepoetin (ARANESP) injection 40 mcg  40 mcg Intravenous Q Tue-HD Gerome Apleyharles Lyles, PA-C   40 mcg at 02/12/14 1022  . diltiazem (CARDIZEM CD) 24 hr capsule 120 mg  120 mg Oral Daily Rhetta MuraJai-Gurmukh Samtani, MD   120 mg at 02/15/14 1020  . escitalopram (LEXAPRO) tablet 5 mg  5 mg Oral Daily Rhetta MuraJai-Gurmukh Samtani, MD   5 mg at 02/15/14 1020  . feeding supplement (NEPRO CARB STEADY) liquid 237 mL  237 mL Oral PRN Gerome Apleyharles Lyles, PA-C      . feeding supplement (RESOURCE BREEZE) (RESOURCE BREEZE) liquid 1 Container  1 Container Oral TID BM Donald Poreavid W Zeyfang, PA-C   1 Container at 02/15/14 1022  . ferric gluconate (NULECIT) 125 mg in sodium chloride 0.9 % 100 mL IVPB  125 mg Intravenous Q T,Th,Sa-HD Gerome Apleyharles Lyles, PA-C   125 mg at 02/12/14 1116  . heparin injection 1,000 Units  1,000 Units Dialysis PRN Gerome Apleyharles Lyles, PA-C      . heparin injection 3,000 Units  3,000 Units Dialysis Once in dialysis Gerome Apleyharles Lyles, PA-C      . heparin injection 5,000 Units  5,000 Units Subcutaneous 3 times per day Rhetta MuraJai-Gurmukh Samtani, MD   5,000 Units at 02/15/14 0605  . HYDROcodone-acetaminophen (NORCO/VICODIN) 5-325 MG per tablet 1-2 tablet  1-2 tablet Oral Q4H PRN Rhetta MuraJai-Gurmukh Samtani, MD   2 tablet at 02/15/14 0827  . isosorbide mononitrate (IMDUR) 24 hr tablet 60 mg  60 mg Oral Daily Jinger NeighborsMary A Lynch, NP   60 mg at 02/15/14 1020  . metoprolol tartrate (LOPRESSOR) tablet 25 mg  25 mg Oral BID Maree Krabbeobert D [name], MD   25 mg at 02/15/14 1020  . metroNIDAZOLE (FLAGYL) tablet 500 mg  500 mg Oral 3 times per day Calvert CantorSaima Rizwan, MD   500 mg at 02/15/14 0605  . morphine 2 MG/ML injection 2 mg  2 mg Intravenous Q4H PRN Calvert CantorSaima Rizwan, MD   2 mg at 02/15/14 1115  . multivitamin with minerals tablet 1 tablet  1 tablet Oral Daily Nehemiah SettleJanardhaha R Grantland Want, MD   1 tablet at 02/15/14 1020  . nicotine (NICODERM CQ - dosed in mg/24 hr)  patch 7 mg  7 mg Transdermal Daily Calvert CantorSaima Rizwan, MD   7 mg at 02/15/14 1021  . nitroGLYCERIN (NITROSTAT) SL tablet 0.4 mg  0.4 mg Sublingual Q5 min PRN Jinger NeighborsMary A Lynch, NP      . pantoprazole (PROTONIX) EC tablet 40 mg  40 mg Oral Daily Calvert CantorSaima Rizwan, MD   40 mg at 02/15/14 1021  . pentafluoroprop-tetrafluoroeth (GEBAUERS) aerosol 1 application  1 application Topical PRN Gerome Apleyharles Lyles, PA-C      . roflumilast (DALIRESP) tablet 500 mcg  500 mcg Oral Daily Rhetta MuraJai-Gurmukh Samtani, MD   500 mcg at 02/15/14 1020  . sodium chloride 0.9 % injection 3 mL  3 mL Intravenous Q12H Rhetta MuraJai-Gurmukh Samtani, MD  3 mL at 02/15/14 1021  . thiamine (VITAMIN B-1) tablet 100 mg  100 mg Oral Daily Nehemiah SettleJanardhaha R Coleen Cardiff, MD   100 mg at 02/15/14 1020  . tiotropium (SPIRIVA) inhalation capsule 18 mcg  18 mcg Inhalation Daily Rhetta MuraJai-Gurmukh Samtani, MD   18 mcg at 02/15/14 0728  . traZODone (DESYREL) tablet 25 mg  25 mg Oral QHS Nehemiah SettleJanardhaha R Nazario Russom, MD   25 mg at 02/14/14 2155    Lab Results:  Results for orders placed during the hospital encounter of 02/09/14 (from the past 48 hour(s))  CLOSTRIDIUM DIFFICILE BY PCR     Status: Abnormal   Collection Time    02/14/14  8:05 AM      Result Value Ref Range   C difficile by pcr POSITIVE (*) NEGATIVE   Comment: CRITICAL RESULT CALLED TO, READ BACK BY AND VERIFIED WITH:     T. HUBBARD RN 10:20 02/14/14 ([email or web address])    Physical Findings: AIMS:  , ,  ,  ,    CIWA:  CIWA-Ar Total: 4 COWS:     Treatment Plan Summary: Daily contact with patient to assess and evaluate symptoms and progress in treatment Medication management  Plan: 1. Start naltrexone 25 mg PO QAm for substance cravings 2. Continue Trazodone 25 mg PO Qhs for isomnia 3. Continue Ativan alcohol detox and CIWA protocol  4. Increase Lexapro 10 mg PO Qam for depression and anxiety 5. Refer to residential substance abuse rehab treatment when medically cleared 6. Psych consult will follow up as clinically  required  Medical Decision Making Problem Points:  Established problem, worsening (2), New problem, with additional work-up planned (4), Review of last therapy session (1) and Review of psycho-social stressors (1) Data Points:  Review or order clinical lab tests (1) Review and summation of old records (2) Review of medication regiment & side effects (2) Review of new medications or change in dosage (2)  I certify that inpatient services furnished can reasonably be expected to improve the patient's condition.   Dionte Blaustein,JANARDHAHA R. 02/15/2014, 12:10 PM

## 2014-02-15 NOTE — Progress Notes (Signed)
Subjective:  Co some lower abdominal discomfrot ,atempting to eat brk , some diarrhea," missed hd sec. stress testing yest"// hd today no sob or cp  Objective Vital signs in last 24 hours: Filed Vitals:   02/14/14 1730 02/14/14 2122 02/14/14 2130 02/15/14 0510  BP: 103/58  127/76 105/53  Pulse:  98 86 103  Temp:   98.6 F (37 C) 98.4 F (36.9 C)  TempSrc:   Oral Oral  Resp:  16 16 16   Height:      Weight:      SpO2: 97%  100% 97%   Weight change:   Physical Exam: General: alert NAd Heart: Occasional irreg/ VR  80s, no rub,mur ,or gallop Lungs:CTA bilat Abdomen: Bs pos. Soft, epigastric and L Lower tender mildly, ND Extremities:no pedal edema  Dialysis Access: pos bruit LUA AVF  Dialysis Orders: TTS @ [facility]  66.5 kg 4 hrs 3K/2.25Ca 400/A1.5 Heparin 3000 U AVF @ LUA  No Hectorol Aranesp 25 mcg on Tues. Venofer 100 mg x 10 (through 10/20   Problem/Plan: 1. Acute respiratory failure - sec volume overload, resolved. 2. Atrial flutter - new onset, asymptomatic,on dilt / MTP, poor candidate for Coumadin 3. ESRD - HD on TTS @ [facility], missed hd yest with stress testing/ HD today.  4. HTN/volume - BP 105/53 this am now on Cardizem 120 mg qd, Metoprolol 25 mg bid;At home 4 bp meds( Will not need that many) and now bp lowish  On meds for A fib. With Metoprolol decr yesterday to 25mg  bid. 5. Anemia - Hgb 8.9,> 10.6  Pending today, Aranesp 25 mcg on Tues, Venofer 100 mg x 10, Aranesp increased to 40 mcg.  6. Sec HPT - Ca 8.7 (9.7 corrected), P 1.6, iPTH 87; no Hectorol, Phoslo with meals.  7. C. Diff pos.+- on Flagly po  8. Nutrition - Alb 2.7, renal diet, multivitamin. Add protein supplement 9. Alcohol abuse - outpatient rehab pending.stated he will seek support at op /"was previously  AAA"  10. Depression - with recent suicidal ideation, seen by Psychiatry, on Lexapro.  11. COPD - tobacco abuse, but down to 2 cigarettes/day; nicotine patch.  12. ASCVD - Hx NSTEMI, R renal artery stenosis w/  stent. STRESS TEST yest No sichemia , nl ef  13. OSA - noncompliant with CPAP, claustrophobic.  14. Deconditioning  - started PT  Lenny Pastelavid Zeyfang, PA-C  [facility] Beeper [phone] 02/15/2014,8:21 AM  LOS: 6 days   Pt seen, examined and agree w A/P as above.  Vinson Moselleob Math Brazie MD pager [phone]    cell [phone] 02/15/2014, 1:00 PM    Labs: Basic Metabolic Panel:  Recent Labs Lab 02/11/14 0758 02/12/14 0349 02/13/14 0236  NA 131* 136* 136*  K 3.5* 3.8 3.7  CL 87* 91* 94*  CO2 23 30 27   GLUCOSE 105* 112* 108*  BUN 52* 31* 25*  CREATININE 6.20* 4.24* 3.29*  CALCIUM 8.9 9.3 8.7  PHOS 3.1 3.3 1.6*   Liver Function Tests:  Recent Labs Lab 02/09/14 1436 02/10/14 0435 02/11/14 0758 02/12/14 0349 02/13/14 0236  AST 39* 33  --   --   --   ALT 22 22  --   --   --   ALKPHOS 112 114  --   --   --   BILITOT 0.3 0.4  --   --   --   PROT 7.8 8.1  --   --   --   ALBUMIN 3.3* 3.4* 2.9* 2.8* 2.7*  Recent Labs Lab 02/09/14 1425  AMMONIA 26   CBC:  Recent Labs Lab 02/09/14 1310 02/10/14 0435 02/11/14 0758 02/12/14 0349 02/13/14 0236  WBC 14.9* 9.1 13.7* 10.4 13.4*  NEUTROABS 13.3*  --   --  8.2* 10.6*  HGB 9.3* 9.5* 8.8* 9.1* 8.9*  HCT 27.1* 26.8* 25.8* 27.7* 27.6*  MCV 95.8 95.7 96.3 100.0 101.8*  PLT 372 312 293 288 298   Cardiac Enzymes:  Recent Labs Lab 02/09/14 1310 02/09/14 2245 02/10/14 0435 02/10/14 1040  TROPONINI 0.51* 0.62* 0.43* 0.35*   Studies/Results: Nm Myocar Multi W/spect W/wall Motion / Ef  02/14/2014   CLINICAL DATA:  Acute chest pain with hypertension, COPD, CHF  EXAM: MYOCARDIAL IMAGING WITH SPECT (REST AND PHARMACOLOGIC-STRESS)  GATED LEFT VENTRICULAR WALL MOTION STUDY  LEFT VENTRICULAR EJECTION FRACTION  TECHNIQUE: Standard myocardial SPECT imaging was performed after resting intravenous injection of 10 mCi Tc-19m sestamibi. Subsequently, intravenous infusion of Lexiscan was performed under the supervision of the  Cardiology staff. At peak effect of the drug, 30 mCi Tc-18m sestamibi was injected intravenously and standard myocardial SPECT imaging was performed. Quantitative gated imaging was also performed to evaluate left ventricular wall motion, and estimate left ventricular ejection fraction.  COMPARISON:  None.  FINDINGS: Perfusion: No decreased activity in the left ventricle on stress imaging to suggest reversible ischemia or infarction.  Wall Motion: Normal left ventricular wall motion. No left ventricular dilation.  Left Ventricular Ejection Fraction: 55 %  End diastolic volume 133 ml  End systolic volume 60 ml  IMPRESSION: 1. No reversible ischemia or infarction.  2. Normal left ventricular wall motion.  3. Left ventricular ejection fraction 55%  4. Low-risk stress test findings*.  *2012 Appropriate Use Criteria for Coronary Revascularization Focused Update: J Am Coll Cardiol. 2012;59(9):857-881. http://content.dementiazones.com.aspx?articleid=1201161   Electronically Signed   By: Ruel Favors M.D.   On: 02/14/2014 13:52   Dg Chest Port 1 View  02/13/2014   CLINICAL DATA:  Hypoxia  EXAM: PORTABLE CHEST - 1 VIEW  COMPARISON:  02/09/2014  FINDINGS: Cardiomediastinal silhouette is stable. No acute infiltrate or pleural effusion. No pulmonary edema. Bony thorax is stable.  IMPRESSION: No active disease.   Electronically Signed   By: Natasha Mead M.D.   On: 02/13/2014 16:58   Medications:   . albuterol  2.5 mg Nebulization BID  . aspirin EC  325 mg Oral Daily  . budesonide-formoterol  2 puff Inhalation BID  . calcium acetate  667 mg Oral Q supper  . calcium carbonate  400 mg of elemental calcium Oral TID  . clindamycin  300 mg Oral 3 times per day  . darbepoetin (ARANESP) injection - DIALYSIS  40 mcg Intravenous Q Tue-HD  . diltiazem  120 mg Oral Daily  . escitalopram  5 mg Oral Daily  . ferric gluconate (FERRLECIT/NULECIT) IV  125 mg Intravenous Q T,Th,Sa-HD  . heparin  3,000 Units Dialysis Once in  dialysis  . heparin  5,000 Units Subcutaneous 3 times per day  . isosorbide mononitrate  60 mg Oral Daily  . metoprolol tartrate  25 mg Oral BID  . metroNIDAZOLE  500 mg Oral 3 times per day  . multivitamin with minerals  1 tablet Oral Daily  . nicotine  7 mg Transdermal Daily  . pantoprazole  40 mg Oral Daily  . roflumilast  500 mcg Oral Daily  . sodium chloride  3 mL Intravenous Q12H  . thiamine  100 mg Oral Daily  . tiotropium  18 mcg  Inhalation Daily  . traZODone  25 mg Oral QHS

## 2014-02-16 LAB — BASIC METABOLIC PANEL
Anion gap: 19 — ABNORMAL HIGH (ref 5–15)
BUN: 39 mg/dL — AB (ref 6–23)
CO2: 19 meq/L (ref 19–32)
CREATININE: 6.76 mg/dL — AB (ref 0.50–1.35)
Calcium: 8.8 mg/dL (ref 8.4–10.5)
Chloride: 96 mEq/L (ref 96–112)
GFR calc Af Amer: 10 mL/min — ABNORMAL LOW (ref >90)
GFR calc non Af Amer: 8 mL/min — ABNORMAL LOW (ref >90)
Glucose, Bld: 219 mg/dL — ABNORMAL HIGH (ref 70–99)
Potassium: 4.9 mEq/L (ref 3.7–5.3)
Sodium: 134 mEq/L — ABNORMAL LOW (ref 137–147)

## 2014-02-16 MED ORDER — ONDANSETRON HCL 4 MG/2ML IJ SOLN
4.0000 mg | Freq: Four times a day (QID) | INTRAMUSCULAR | Status: DC | PRN
  Administered 2014-02-16 – 2014-02-17 (×2): 4 mg via INTRAVENOUS
  Filled 2014-02-16 (×2): qty 2

## 2014-02-16 MED ORDER — HYDROCODONE-ACETAMINOPHEN 5-325 MG PO TABS
ORAL_TABLET | ORAL | Status: AC
  Filled 2014-02-16: qty 2

## 2014-02-16 NOTE — Progress Notes (Signed)
Hemodialysis- Attempted to pick up pt for dialysis this am, refusing. "I feel too sick". Will check back.

## 2014-02-16 NOTE — Progress Notes (Addendum)
[facility] PROGRESS NOTE  Albert BrasilDouglas E Aden ONG:[id number]:[id number] DOB: 06/13/1960 DOA: 02/09/2014 PCP: Wilmer FloorAMPBELL, STEPHEN D., MD  Assessment/Plan: Principal Problem:   Acute respiratory failure with hypoxia Active Problems:   Uncontrolled hypertension   CKD (chronic kidney disease) stage V requiring chronic dialysis   COPD (chronic obstructive pulmonary disease)   ETOH abuse   OSA (obstructive sleep apnea)   Atrial flutter   OSA on CPAP   Nicotine dependence   Chest pain   Aspiration pneumonia   Enteritis due to Clostridium difficile   Brief narrative:  [name] is a 53 y.o. male with OSA, COPD FEV1 57%, Diastolic CHF, ESRD on HD TTS, PAD s/p renal artery stent, HTN, ETOH abuse, smoker presenting from dialysis center (did not receive dialysis) with hypoxia,dyspnea, chest pain found to be in A-flutter. He had missed his last dialysis because he was depressed and drinking. He also had some nausea and vomiting.  He underwent emergent dialysis on 10/3. Transferred to [facility] (Stepdown unit) at 10 PM for chest pain which resolved after Morphine and Nitro. Cardiology consulted on 10/5- not a candidate for NOAC due to anemia and ETOH abuse- EF 60-65 %- no need for cardioversion. Mild Troponin elevation noted with T max of 0.43   Subjective:  Co abd cramping discomfort   Assessment/Plan:  Principal Problem:  Acute respiratory failure with hypoxia  - due to fluid overload from missing dialysis- resolved with dialysis treatment  ? HCAP vs Aspiration currently on Clindamycin- will d/c Clindamycin now due to C diff.    C diff with abdominal cramping - started having loose stools yesterday - continue oral Flagyl - keep on clears until abd pain improves- follow  12 stools last night   Atrial flutter  - spontaneous conversion- not a candidate for anticoagulation due to ETOH  poor candidate for Coumadin  Had MAT yesterday  related to underlying illness. - resolved   Chest pain   -with mildly elevated Troponins- demand ischemia- Myoview negative- see cardiology notes    Uncontrolled hypertension Continue on Cardizem (on Amlodipine as outpt), Imdur and Metoprolol  - outpt Hydralazine and Clonidine held in favor of above   CKD (chronic kidney disease) stage V requiring chronic dialysis  - dialysis per renal team   Anemia  - due to CKD and Iron deficiency- Iron being replaced by renal team   COPD (chronic obstructive pulmonary disease)  - on Albuterol, Roflumilast, Symbicort and Spiriva   ETOH abuse  - no longer in withdrawal-  - willing to go to residential to rehab   Depression - with recent suicidal ideation, seen by Psychiatry, on Lexapro  OSA (obstructive sleep apnea)  - CPAP   Nicotine dependence  - nicotine patch   Code Status: Full code  Family Communication:  Disposition Plan: rehab DVT prophylaxis: Heparin  Consultants:  Psych  Cardio  Nephro  Procedures:  ECHO  Antibiotics:  Anti-infectives    Start    Dose/Rate  Route  Frequency  Ordered  Stop    02/14/14 1130   metroNIDAZOLE (FLAGYL) tablet 500 mg  500 mg  Oral  3 times per day  02/14/14 1033     02/12/14 0745   clindamycin (CLEOCIN) capsule 300 mg  300 mg  Oral  3 times per day  02/12/14 0744     02/11/14 1200   vancomycin (VANCOCIN) 500 mg in sodium chloride 0.9 % 100 mL IVPB Status: Discontinued  500 mg  100 mL/hr over 60 Minutes  Intravenous  Once  02/10/14 1435  02/11/14 0829    02/11/14 1200   vancomycin (VANCOCIN) IVPB 750 mg/150 ml premix  750 mg  150 mL/hr over 60 Minutes  Intravenous  Every Mon (Hemodialysis)  02/11/14 0829  02/11/14 1215    02/10/14 1500   ceFEPIme (MAXIPIME) 1 g in dextrose 5 % 50 mL IVPB Status: Discontinued  1 g  100 mL/hr over 30 Minutes  Intravenous  Every 24 hours  02/09/14 1456  02/12/14 0744    02/09/14 1500   ceFEPIme (MAXIPIME) 1 g in dextrose 5 % 50 mL IVPB  1 g  100 mL/hr over 30 Minutes  Intravenous  Once  02/09/14 1446  02/09/14 1615     02/09/14 1500   vancomycin (VANCOCIN) IVPB 1000 mg/200 mL premix  1,000 mg  200 mL/hr over 60 Minutes  Intravenous  Once  02/09/14 1456  02/09/14 2111          Objective: Filed Vitals:   02/16/14 1050 02/16/14 1058 02/16/14 1130 02/16/14 1200  BP: 109/73 121/76 124/79 107/72  Pulse: 102 101 105 107  Temp: 98.2 F (36.8 C)     TempSrc: Oral     Resp: 19     Height:      Weight: 66.6 kg (146 lb 13.2 oz)     SpO2: 96%       Intake/Output Summary (Last 24 hours) at 02/16/14 1209 Last data filed at 02/16/14 0958  Gross per 24 hour  Intake    723 ml  Output    933 ml  Net   -210 ml    Exam: General: alert NAd  Heart: Occasional irreg/ VR 90s, no rub,mur ,or gallop  Lungs:CTA bilat  Abdomen: Bs pos. Soft, epigastric tender mildly, ND  Extremities:no pedal edema  Dialysis Access: pos bruit LUA AVF        Data Reviewed: Basic Metabolic Panel:  Recent Labs Lab 02/11/14 0758 02/12/14 0349 02/13/14 0236 02/15/14 1243 02/16/14 0900  NA 131* 136* 136* 131* 134*  K 3.5* 3.8 3.7 3.7 4.9  CL 87* 91* 94* 90* 96  CO2 23 30 27 19 19   GLUCOSE 105* 112* 108* 163* 219*  BUN 52* 31* 25* 64* 39*  CREATININE 6.20* 4.24* 3.29* 9.22* 6.76*  CALCIUM 8.9 9.3 8.7 9.0 8.8  PHOS 3.1 3.3 1.6* 2.4  --     Liver Function Tests:  Recent Labs Lab 02/09/14 1436 02/10/14 0435 02/11/14 0758 02/12/14 0349 02/13/14 0236 02/15/14 1243  AST 39* 33  --   --   --   --   ALT 22 22  --   --   --   --   ALKPHOS 112 114  --   --   --   --   BILITOT 0.3 0.4  --   --   --   --   PROT 7.8 8.1  --   --   --   --   ALBUMIN 3.3* 3.4* 2.9* 2.8* 2.7* 2.5*   No results found for this basename: LIPASE, AMYLASE,  in the last 168 hours  Recent Labs Lab 02/09/14 1425  AMMONIA 26    CBC:  Recent Labs Lab 02/09/14 1310 02/10/14 0435 02/11/14 0758 02/12/14 0349 02/13/14 0236 02/15/14 1243  WBC 14.9* 9.1 13.7* 10.4 13.4* 14.7*  NEUTROABS 13.3*  --   --  8.2* 10.6*  --   HGB 9.3* 9.5*  8.8* 9.1* 8.9* 8.8*  HCT 27.1* 26.8* 25.8* 27.7* 27.6*  26.7*  MCV 95.8 95.7 96.3 100.0 101.8* 98.9  PLT 372 312 293 288 298 338    Cardiac Enzymes:  Recent Labs Lab 02/09/14 1310 02/09/14 2245 02/10/14 0435 02/10/14 1040  TROPONINI 0.51* 0.62* 0.43* 0.35*   BNP (last 3 results)  Recent Labs  02/09/14 1425  PROBNP >70000.0*     CBG: No results found for this basename: GLUCAP,  in the last 168 hours  Recent Results (from the past 240 hour(s))  CULTURE, BLOOD (ROUTINE X 2)     Status: None   Collection Time    02/09/14  3:03 PM      Result Value Ref Range Status   Specimen Description BLOOD RIGHT HAND   Final   Special Requests BOTTLES DRAWN AEROBIC AND ANAEROBIC 4CC   Final   Culture  Setup Time     Final   Value: 02/09/2014 20:32     Performed at [facility]   Culture     Final   Value: NO GROWTH 5 DAYS     Performed at [facility]   Report Status 02/15/2014 FINAL   Final  CULTURE, BLOOD (ROUTINE X 2)     Status: None   Collection Time    02/09/14  3:46 PM      Result Value Ref Range Status   Specimen Description BLOOD RIGHT FOREARM   Final   Special Requests BOTTLES DRAWN AEROBIC ONLY 5CC   Final   Culture  Setup Time     Final   Value: 02/09/2014 21:45     Performed at [facility]   Culture     Final   Value: STAPHYLOCOCCUS SPECIES (COAGULASE NEGATIVE)     Note: THE SIGNIFICANCE OF ISOLATING THIS ORGANISM FROM A SINGLE SET OF BLOOD CULTURES WHEN MULTIPLE SETS ARE DRAWN IS UNCERTAIN. PLEASE NOTIFY THE MICROBIOLOGY DEPARTMENT WITHIN ONE WEEK IF SPECIATION AND SENSITIVITIES ARE REQUIRED.     Note: Gram Stain Report Called to,Read Back By and Verified With: KENYETTA EASON 02/10/14 1515 BY SMITHERSJ     Performed at [facility]   Report Status 02/11/2014 FINAL   Final  MRSA PCR SCREENING     Status: None   Collection Time    02/09/14 10:38 PM      Result Value Ref Range Status   MRSA by PCR NEGATIVE  NEGATIVE Final    Comment:            The GeneXpert MRSA Assay (FDA     approved for NASAL specimens     only), is one component of a     comprehensive MRSA colonization     surveillance program. It is not     intended to diagnose MRSA     infection nor to guide or     monitor treatment for     MRSA infections.  CLOSTRIDIUM DIFFICILE BY PCR     Status: Abnormal   Collection Time    02/14/14  8:05 AM      Result Value Ref Range Status   C difficile by pcr POSITIVE (*) NEGATIVE Final   Comment: CRITICAL RESULT CALLED TO, READ BACK BY AND VERIFIED WITH:     T. HUBBARD RN 10:20 02/14/14 ([email or web address])     Studies: Nm Myocar Multi W/spect W/wall Motion / Ef  02/14/2014   CLINICAL DATA:  Acute chest pain with hypertension, COPD, CHF  EXAM: MYOCARDIAL IMAGING WITH SPECT (REST AND PHARMACOLOGIC-STRESS)  GATED LEFT VENTRICULAR WALL MOTION STUDY  LEFT VENTRICULAR EJECTION FRACTION  TECHNIQUE: Standard myocardial SPECT imaging was performed after resting intravenous injection of 10 mCi Tc-2358m sestamibi. Subsequently, intravenous infusion of Lexiscan was performed under the supervision of the Cardiology staff. At peak effect of the drug, 30 mCi Tc-8058m sestamibi was injected intravenously and standard myocardial SPECT imaging was performed. Quantitative gated imaging was also performed to evaluate left ventricular wall motion, and estimate left ventricular ejection fraction.  COMPARISON:  None.  FINDINGS: Perfusion: No decreased activity in the left ventricle on stress imaging to suggest reversible ischemia or infarction.  Wall Motion: Normal left ventricular wall motion. No left ventricular dilation.  Left Ventricular Ejection Fraction: 55 %  End diastolic volume 133 ml  End systolic volume 60 ml  IMPRESSION: 1. No reversible ischemia or infarction.  2. Normal left ventricular wall motion.  3. Left ventricular ejection fraction 55%  4. Low-risk stress test findings*.  *2012 Appropriate Use Criteria for Coronary Revascularization  Focused Update: J Am Coll Cardiol. 2012;59(9):857-881. http://content.dementiazones.comonlinejacc.org/article.aspx?articleid=1201161   Electronically Signed   By: Ruel Favorsrevor  Shick M.D.   On: 02/14/2014 13:52   Dg Chest Port 1 View  02/13/2014   CLINICAL DATA:  Hypoxia  EXAM: PORTABLE CHEST - 1 VIEW  COMPARISON:  02/09/2014  FINDINGS: Cardiomediastinal silhouette is stable. No acute infiltrate or pleural effusion. No pulmonary edema. Bony thorax is stable.  IMPRESSION: No active disease.   Electronically Signed   By: Natasha MeadLiviu  Pop M.D.   On: 02/13/2014 16:58   Dg Chest Portable 1 View  02/09/2014   CLINICAL DATA:  Acute chest pain with shortness of breath, history of hypertension and systolic heart failure as well as peripheral vascular disease, alcohol abuse, gastric ulcers, anxiety and depression with chronic renal disease and personal history of congestive heart failure  EXAM: PORTABLE CHEST - 1 VIEW  COMPARISON:  01/14/2014  FINDINGS: Moderate cardiac enlargement. Moderate vascular congestion. Mild peribronchial cuffing and interstitial prominence. More focal hazy left suprahilar opacity. No pleural effusions.  IMPRESSION: Findings suggest mild cardiogenic interstitial pulmonary edema. Recommend radiographic follow-up to ensure resolution of left suprahilar opacity which likely represents mildly asymmetric edema.   Electronically Signed   By: Esperanza Heiraymond  Rubner M.D.   On: 02/09/2014 14:33    Scheduled Meds: . albuterol  2.5 mg Nebulization BID  . aspirin EC  325 mg Oral Daily  . budesonide-formoterol  2 puff Inhalation BID  . calcium acetate  667 mg Oral Q supper  . darbepoetin (ARANESP) injection - DIALYSIS  40 mcg Intravenous Q Tue-HD  . diltiazem  120 mg Oral Daily  . escitalopram  10 mg Oral Daily  . feeding supplement (RESOURCE BREEZE)  1 Container Oral TID BM  . ferric gluconate (FERRLECIT/NULECIT) IV  125 mg Intravenous Q T,Th,Sa-HD  . heparin  5,000 Units Subcutaneous 3 times per day  . isosorbide mononitrate   60 mg Oral Daily  . metoprolol tartrate  25 mg Oral BID  . metroNIDAZOLE  500 mg Oral 3 times per day  . multivitamin with minerals  1 tablet Oral Daily  . naltrexone  25 mg Oral Daily  . nicotine  7 mg Transdermal Daily  . pantoprazole  40 mg Oral Daily  . roflumilast  500 mcg Oral Daily  . sodium chloride  3 mL Intravenous Q12H  . thiamine  100 mg Oral Daily  . tiotropium  18 mcg Inhalation Daily  . traZODone  25 mg Oral QHS   Continuous Infusions:   Principal Problem:   Acute  respiratory failure with hypoxia Active Problems:   Uncontrolled hypertension   CKD (chronic kidney disease) stage V requiring chronic dialysis   COPD (chronic obstructive pulmonary disease)   ETOH abuse   OSA (obstructive sleep apnea)   Atrial flutter   OSA on CPAP   Nicotine dependence   Chest pain   Aspiration pneumonia   Enteritis due to Clostridium difficile    Time spent: 40 minutes   [facility]  [facility] Pager [phone]. If 7PM-7AM, please contact night-coverage at [email or web address], password [facility] 02/16/2014, 12:09 PM  LOS: 7 days

## 2014-02-16 NOTE — Progress Notes (Signed)
Patient continues to complain of pain throughout night, asking for PRN medications often. Patient continues shaking of hands and sweating during night. Will continue to monitor. Cindee SaltMcBride,Eboni Coval K, RN

## 2014-02-16 NOTE — Progress Notes (Signed)
Subjective:  Co abd cramping discomfort  Trying liquid brk/ hd today on schedule, incomplete hd yest. Sec to abd pain( had missed thurs hd sec to card stress testing) thinks hand shaking tremors almost resolved  Objective Vital signs in last 24 hours: Filed Vitals:   02/15/14 1453 02/15/14 1716 02/15/14 2115 02/16/14 0503  BP: 109/67 95/52 103/61 109/72  Pulse: 108 115 108 86  Temp: 98.4 F (36.9 C) 99.3 F (37.4 C) 98.3 F (36.8 C) 98.7 F (37.1 C)  TempSrc: Oral Oral    Resp: 20 19 22 20   Height:      Weight: 65.4 kg (144 lb 2.9 oz)  64.864 kg (143 lb)   SpO2: 96% 98% 100% 95%   Physical Exam:  General: alert NAd  Heart: Occasional irreg/ VR 90s, no rub,mur ,or gallop  Lungs:CTA bilat  Abdomen: Bs pos. Soft, epigastric  tender mildly, ND  Extremities:no pedal edema  Dialysis Access: pos bruit LUA AVF   Dialysis Orders: TTS @ [facility]  66.5 kg 4 hrs 3K/2.25Ca 400/A1.5 Heparin 3000 U AVF @ LUA  No Hectorol Aranesp 25 mcg on Tues. Venofer 100 mg x 10 (through 10/20   Problem/Plan:  1. Acute respiratory failure / volume overload- resolved. 2. Atrial flutter - new onset,  Now SR on Telem./asymptomatic,on dilt / MTP, poor candidate for Coumadin 3. ESRD on HD -  HD today  4. HTN/volume - BP 109/72 this am now on Cardizem 120 mg qd, Metoprolol 25 mg bid;At home 4 bp meds( Will not need that many) and now bp lowish On meds for A fib. With Metoprolol decr in hosp ( 25mg  bid now) To tolerate uf on hd 5. Anemia - Hgb 8.9,> 10.6>8.8 Pending today, Aranesp 25 mcg on Tues, Venofer 100 mg x 10, Aranesp increased to 40 mcg.  6. Sec HPT - Ca 9.0 (10.2 corrected), P 1.6>2.4, iPTH 87; no Hectorol, Phoslo now ONLY with Supper.also on calcium carb tid dc 7. C. Diff pos.+- on Flagly po / stopped clindamycin 8. Nutrition - Alb 2.7>2.5, renal diet, multivitamin. Add protein supplement 9. Alcohol abuse - outpatient rehab pending.stated he will seek support at op /"was previously AAA" / Psyc seeing/on  Thiamine 10. Depression - with recent suicidal ideation, seen by Psychiatry, on Lexapro.  11. COPD - tobacco abuse, but down to 2 cigarettes/day; nicotine patch.  12. ASCVD - Hx NSTEMI, R renal artery stenosis w/ stent. STRESS TEST yest No sichemia , nl ef  13. OSA - noncompliant with CPAP, claustrophobic.  14. Deconditioning - started PT  Lenny Pastelavid Zeyfang, PA-C [facility] Beeper [phone] 02/16/2014,8:21 AM  LOS: 7 days   Pt seen, examined and agree w A/P as above.  Vinson Moselleob Hermila Millis MD pager [phone]    cell [phone] 02/16/2014, 1:04 PM    Labs: Basic Metabolic Panel:  Recent Labs Lab 02/12/14 0349 02/13/14 0236 02/15/14 1243  NA 136* 136* 131*  K 3.8 3.7 3.7  CL 91* 94* 90*  CO2 30 27 19   GLUCOSE 112* 108* 163*  BUN 31* 25* 64*  CREATININE 4.24* 3.29* 9.22*  CALCIUM 9.3 8.7 9.0  PHOS 3.3 1.6* 2.4    Recent Labs Lab 02/09/14 1425  AMMONIA 26   CBC:  Recent Labs Lab 02/09/14 1310 02/10/14 0435 02/11/14 0758 02/12/14 0349 02/13/14 0236 02/15/14 1243  WBC 14.9* 9.1 13.7* 10.4 13.4* 14.7*  NEUTROABS 13.3*  --   --  8.2* 10.6*  --   HGB 9.3* 9.5* 8.8* 9.1* 8.9* 8.8*  HCT 27.1* 26.8* 25.8* 27.7* 27.6* 26.7*  MCV 95.8 95.7 96.3 100.0 101.8* 98.9  PLT 372 312 293 288 298 338   Cardiac Enzymes:  Recent Labs Lab 02/09/14 1310 02/09/14 2245 02/10/14 0435 02/10/14 1040  TROPONINI 0.51* 0.62* 0.43* 0.35*   Studies/Results: Nm Myocar Multi W/spect W/wall Motion / Ef  02/14/2014   CLINICAL DATA:  Acute chest pain with hypertension, COPD, CHF  EXAM: MYOCARDIAL IMAGING WITH SPECT (REST AND PHARMACOLOGIC-STRESS)  GATED LEFT VENTRICULAR WALL MOTION STUDY  LEFT VENTRICULAR EJECTION FRACTION  TECHNIQUE: Standard myocardial SPECT imaging was performed after resting intravenous injection of 10 mCi Tc-2523m sestamibi. Subsequently, intravenous infusion of Lexiscan was performed under the supervision of the Cardiology staff. At peak effect of the drug, 30 mCi  Tc-4123m sestamibi was injected intravenously and standard myocardial SPECT imaging was performed. Quantitative gated imaging was also performed to evaluate left ventricular wall motion, and estimate left ventricular ejection fraction.  COMPARISON:  None.  FINDINGS: Perfusion: No decreased activity in the left ventricle on stress imaging to suggest reversible ischemia or infarction.  Wall Motion: Normal left ventricular wall motion. No left ventricular dilation.  Left Ventricular Ejection Fraction: 55 %  End diastolic volume 133 ml  End systolic volume 60 ml  IMPRESSION: 1. No reversible ischemia or infarction.  2. Normal left ventricular wall motion.  3. Left ventricular ejection fraction 55%  4. Low-risk stress test findings*.  *2012 Appropriate Use Criteria for Coronary Revascularization Focused Update: J Am Coll Cardiol. 2012;59(9):857-881. http://content.dementiazones.comonlinejacc.org/article.aspx?articleid=1201161   Electronically Signed   By: Ruel Favorsrevor  Shick M.D.   On: 02/14/2014 13:52   Medications:   . albuterol  2.5 mg Nebulization BID  . aspirin EC  325 mg Oral Daily  . budesonide-formoterol  2 puff Inhalation BID  . calcium acetate  667 mg Oral Q supper  . calcium carbonate  400 mg of elemental calcium Oral TID  . darbepoetin (ARANESP) injection - DIALYSIS  40 mcg Intravenous Q Tue-HD  . diltiazem  120 mg Oral Daily  . escitalopram  10 mg Oral Daily  . feeding supplement (RESOURCE BREEZE)  1 Container Oral TID BM  . ferric gluconate (FERRLECIT/NULECIT) IV  125 mg Intravenous Q T,Th,Sa-HD  . heparin  5,000 Units Subcutaneous 3 times per day  . isosorbide mononitrate  60 mg Oral Daily  . metoprolol tartrate  25 mg Oral BID  . metroNIDAZOLE  500 mg Oral 3 times per day  . multivitamin with minerals  1 tablet Oral Daily  . naltrexone  25 mg Oral Daily  . nicotine  7 mg Transdermal Daily  . pantoprazole  40 mg Oral Daily  . roflumilast  500 mcg Oral Daily  . sodium chloride  3 mL Intravenous Q12H  .  thiamine  100 mg Oral Daily  . tiotropium  18 mcg Inhalation Daily  . traZODone  25 mg Oral QHS

## 2014-02-16 NOTE — Procedures (Signed)
I was present at this dialysis session, have reviewed the session itself and made  appropriate changes  Vinson Moselleob Akito Boomhower MD (pgr) [phone]    [phone]) [phone] 02/16/2014, 2:07 PM

## 2014-02-16 NOTE — Clinical Social Work Note (Signed)
9:30 AM-CSW received consult for follow-up with rehab referral. CSW made referral to [facility] ([phone]) and spoke with charge RN Tamela OddiBetsy, who reported reviewing clinicals as facility has beds available.  3:00PM-[facility] as [facility]'s acuity was too high for unit. CSW made MD Lynford Citizen(Arbol) aware.   Patient has been provided with a list of inpatient and outpatient substance abuse programs. No further needs. CSW signing off.  Haeley Fordham Patrick-Jefferson, LCSWA Weekend Clinical Social Worker [phone]

## 2014-02-17 MED ORDER — METOPROLOL TARTRATE 1 MG/ML IV SOLN
5.0000 mg | Freq: Once | INTRAVENOUS | Status: AC
  Administered 2014-02-17: 5 mg via INTRAVENOUS
  Filled 2014-02-17: qty 5

## 2014-02-17 MED ORDER — DILTIAZEM HCL ER COATED BEADS 180 MG PO CP24
180.0000 mg | ORAL_CAPSULE | Freq: Every day | ORAL | Status: DC
  Administered 2014-02-17 – 2014-02-18 (×2): 180 mg via ORAL
  Filled 2014-02-17 (×2): qty 1

## 2014-02-17 MED ORDER — DILTIAZEM HCL 25 MG/5ML IV SOLN
5.0000 mg | Freq: Once | INTRAVENOUS | Status: AC
Start: 2014-02-17 — End: 2014-02-17
  Administered 2014-02-17: 5 mg via INTRAVENOUS
  Filled 2014-02-17: qty 5

## 2014-02-17 MED ORDER — METOPROLOL TARTRATE 1 MG/ML IV SOLN
2.5000 mg | Freq: Four times a day (QID) | INTRAVENOUS | Status: DC | PRN
  Filled 2014-02-17: qty 5

## 2014-02-17 NOTE — Progress Notes (Addendum)
[facility] PROGRESS NOTE  DONTRE LADUCA STM:[id number] DOB: 1960-06-15 DOA: 02/09/2014 PCP: Wilmer Floor., MD  Assessment/Plan: Principal Problem:   Acute respiratory failure with hypoxia Active Problems:   Uncontrolled hypertension   CKD (chronic kidney disease) stage V requiring chronic dialysis   COPD (chronic obstructive pulmonary disease)   ETOH abuse   OSA (obstructive sleep apnea)   Atrial flutter   OSA on CPAP   Nicotine dependence   Chest pain   Aspiration pneumonia   Enteritis due to Clostridium difficile     Brief narrative:  [name] is a 53 y.o. male with OSA, COPD FEV1 57%, Diastolic CHF, ESRD on HD TTS, PAD s/p renal artery stent, HTN, ETOH abuse, smoker presenting from dialysis center (did not receive dialysis) with hypoxia,dyspnea, chest pain found to be in A-flutter. He had missed his last dialysis because he was depressed and drinking. He also had some nausea and vomiting.  He underwent emergent dialysis on 10/3. Transferred to [facility] (Stepdown unit) at 10 PM for chest pain which resolved after Morphine and Nitro. Cardiology consulted on 10/5- not a candidate for NOAC due to anemia and ETOH abuse- EF 60-65 %- no need for cardioversion. Mild Troponin elevation noted with T max of 0.43    Assessment/Plan:    Acute respiratory failure with hypoxia  - due to fluid overload from missing dialysis- resolved with dialysis treatment  ? HCAP vs Aspiration currently on Clindamycin- will d/c Clindamycin now due to C diff.    C diff with abdominal cramping  - started having loose stools yesterday - continue oral Flagyl - keep on clears until abd pain improves- follow  Still having diarrhea    Atrial flutter /fibrillation with rapid ventricular response this a.m. resolved with IV Cardizem and IV metoprolol pushes, diltiazem increased to 180 mg Patient had an episode of recurrent atrial fibrillation this morning Not a candidate for  anticoagulation due to ETOH  poor candidate for Coumadin  Repeat EKG showed patient to be in sinus rhythm  Had MAT 10/9 related to underlying illness.  - resolved    Chest pain  -with mildly elevated Troponins- demand ischemia- Myoview negative- see cardiology notes   Uncontrolled hypertension  Continue on Cardizem, dose increased today (on Amlodipine as outpt), Imdur and Metoprolol  - outpt Hydralazine and Clonidine held in favor of above    CKD (chronic kidney disease) stage V requiring chronic dialysis  - dialysis per renal team   Anemia  - due to CKD and Iron deficiency- Iron being replaced by renal team    COPD (chronic obstructive pulmonary disease)  - on Albuterol, Roflumilast, Symbicort and Spiriva    ETOH abuse  - no longer in withdrawal-  - willing to go to residential to rehab   Depression - with recent suicidal ideation, seen by Psychiatry, on Lexapro  OSA (obstructive sleep apnea)  - CPAP   Nicotine dependence  - nicotine patch    Code Status: Full code  Family Communication:  Disposition Plan: rehab  DVT prophylaxis: Heparin  Consultants:  Psych  Cardio  Nephro  Procedures:  ECHO  Antibiotics:  Anti-infectives    Start    Dose/Rate  Route  Frequency  Ordered  Stop    02/14/14 1130   metroNIDAZOLE (FLAGYL) tablet 500 mg  500 mg  Oral  3 times per day  02/14/14 1033     02/12/14 0745   clindamycin (CLEOCIN) capsule 300 mg  300 mg  Oral  3 times per day  02/12/14 0744     02/11/14 1200   vancomycin (VANCOCIN) 500 mg in sodium chloride 0.9 % 100 mL IVPB Status: Discontinued  500 mg  100 mL/hr over 60 Minutes  Intravenous  Once  02/10/14 1435  02/11/14 0829    02/11/14 1200   vancomycin (VANCOCIN) IVPB 750 mg/150 ml premix  750 mg  150 mL/hr over 60 Minutes  Intravenous  Every Mon (Hemodialysis)  02/11/14 0829  02/11/14 1215    02/10/14 1500   ceFEPIme (MAXIPIME) 1 g in dextrose 5 % 50 mL IVPB Status: Discontinued  1 g  100 mL/hr over 30 Minutes   Intravenous  Every 24 hours  02/09/14 1456  02/12/14 0744    02/09/14 1500   ceFEPIme (MAXIPIME) 1 g in dextrose 5 % 50 mL IVPB  1 g  100 mL/hr over 30 Minutes  Intravenous  Once  02/09/14 1446  02/09/14 1615    02/09/14 1500   vancomycin (VANCOCIN) IVPB 1000 mg/200 mL premix  1,000 mg  200 mL/hr over 60 Minutes  Intravenous  Once  02/09/14 1456  02/09/14 2111          HPI/Subjective: Tachycardic this am, improved after receiving IV Cardizem and IV metoprolol  Objective: Filed Vitals:   02/17/14 0402 02/17/14 0754 02/17/14 0808 02/17/14 0851  BP: 149/66 110/56 114/63 120/63  Pulse: 65  117 147  Temp: 98.3 F (36.8 C)  98 F (36.7 C)   TempSrc: Oral  Oral   Resp: 17     Height:      Weight:      SpO2: 98%  100% 98%    Intake/Output Summary (Last 24 hours) at 02/17/14 1241 Last data filed at 02/17/14 0930  Gross per 24 hour  Intake    360 ml  Output   1881 ml  Net  -1521 ml    Exam:  General: alert NAd  Heart: Occasional irreg/ VR 90s, no rub,mur ,or gallop  Lungs:CTA bilat  Abdomen: Bs pos. Soft, epigastric tender mildly, ND  Extremities:no pedal edema  Dialysis Access: pos bruit LUA AVF  The      Data Reviewed: Basic Metabolic Panel:  Recent Labs Lab 02/11/14 0758 02/12/14 0349 02/13/14 0236 02/15/14 1243 02/16/14 0900  NA 131* 136* 136* 131* 134*  K 3.5* 3.8 3.7 3.7 4.9  CL 87* 91* 94* 90* 96  CO2 23 30 27 19 19   GLUCOSE 105* 112* 108* 163* 219*  BUN 52* 31* 25* 64* 39*  CREATININE 6.20* 4.24* 3.29* 9.22* 6.76*  CALCIUM 8.9 9.3 8.7 9.0 8.8  PHOS 3.1 3.3 1.6* 2.4  --     Liver Function Tests:  Recent Labs Lab 02/11/14 0758 02/12/14 0349 02/13/14 0236 02/15/14 1243  ALBUMIN 2.9* 2.8* 2.7* 2.5*   No results found for this basename: LIPASE, AMYLASE,  in the last 168 hours No results found for this basename: AMMONIA,  in the last 168 hours  CBC:  Recent Labs Lab 02/11/14 0758 02/12/14 0349 02/13/14 0236 02/15/14 1243  WBC 13.7*  10.4 13.4* 14.7*  NEUTROABS  --  8.2* 10.6*  --   HGB 8.8* 9.1* 8.9* 8.8*  HCT 25.8* 27.7* 27.6* 26.7*  MCV 96.3 100.0 101.8* 98.9  PLT 293 288 298 338    Cardiac Enzymes: No results found for this basename: CKTOTAL, CKMB, CKMBINDEX, TROPONINI,  in the last 168 hours BNP (last 3 results)  Recent Labs  02/09/14 1425  PROBNP >70000.0*  CBG: No results found for this basename: GLUCAP,  in the last 168 hours  Recent Results (from the past 240 hour(s))  CULTURE, BLOOD (ROUTINE X 2)     Status: None   Collection Time    02/09/14  3:03 PM      Result Value Ref Range Status   Specimen Description BLOOD RIGHT HAND   Final   Special Requests BOTTLES DRAWN AEROBIC AND ANAEROBIC 4CC   Final   Culture  Setup Time     Final   Value: 02/09/2014 20:32     Performed at [facility]   Culture     Final   Value: NO GROWTH 5 DAYS     Performed at [facility]   Report Status 02/15/2014 FINAL   Final  CULTURE, BLOOD (ROUTINE X 2)     Status: None   Collection Time    02/09/14  3:46 PM      Result Value Ref Range Status   Specimen Description BLOOD RIGHT FOREARM   Final   Special Requests BOTTLES DRAWN AEROBIC ONLY 5CC   Final   Culture  Setup Time     Final   Value: 02/09/2014 21:45     Performed at [facility]   Culture     Final   Value: STAPHYLOCOCCUS SPECIES (COAGULASE NEGATIVE)     Note: THE SIGNIFICANCE OF ISOLATING THIS ORGANISM FROM A SINGLE SET OF BLOOD CULTURES WHEN MULTIPLE SETS ARE DRAWN IS UNCERTAIN. PLEASE NOTIFY THE MICROBIOLOGY DEPARTMENT WITHIN ONE WEEK IF SPECIATION AND SENSITIVITIES ARE REQUIRED.     Note: Gram Stain Report Called to,Read Back By and Verified With: KENYETTA EASON 02/10/14 1515 BY SMITHERSJ     Performed at [facility]   Report Status 02/11/2014 FINAL   Final  MRSA PCR SCREENING     Status: None   Collection Time    02/09/14 10:38 PM      Result Value Ref Range Status   MRSA by PCR NEGATIVE  NEGATIVE Final    Comment:            The GeneXpert MRSA Assay (FDA     approved for NASAL specimens     only), is one component of a     comprehensive MRSA colonization     surveillance program. It is not     intended to diagnose MRSA     infection nor to guide or     monitor treatment for     MRSA infections.  CLOSTRIDIUM DIFFICILE BY PCR     Status: Abnormal   Collection Time    02/14/14  8:05 AM      Result Value Ref Range Status   C difficile by pcr POSITIVE (*) NEGATIVE Final   Comment: CRITICAL RESULT CALLED TO, READ BACK BY AND VERIFIED WITH:     T. HUBBARD RN 10:20 02/14/14 ([email or web address])     Studies: Nm Myocar Multi W/spect W/wall Motion / Ef  02/14/2014   CLINICAL DATA:  Acute chest pain with hypertension, COPD, CHF  EXAM: MYOCARDIAL IMAGING WITH SPECT (REST AND PHARMACOLOGIC-STRESS)  GATED LEFT VENTRICULAR WALL MOTION STUDY  LEFT VENTRICULAR EJECTION FRACTION  TECHNIQUE: Standard myocardial SPECT imaging was performed after resting intravenous injection of 10 mCi Tc-7082m sestamibi. Subsequently, intravenous infusion of Lexiscan was performed under the supervision of the Cardiology staff. At peak effect of the drug, 30 mCi Tc-2182m sestamibi was injected intravenously and standard myocardial SPECT imaging was performed. Quantitative gated imaging was  also performed to evaluate left ventricular wall motion, and estimate left ventricular ejection fraction.  COMPARISON:  None.  FINDINGS: Perfusion: No decreased activity in the left ventricle on stress imaging to suggest reversible ischemia or infarction.  Wall Motion: Normal left ventricular wall motion. No left ventricular dilation.  Left Ventricular Ejection Fraction: 55 %  End diastolic volume 133 ml  End systolic volume 60 ml  IMPRESSION: 1. No reversible ischemia or infarction.  2. Normal left ventricular wall motion.  3. Left ventricular ejection fraction 55%  4. Low-risk stress test findings*.  *2012 Appropriate Use Criteria for Coronary Revascularization  Focused Update: J Am Coll Cardiol. 2012;59(9):857-881. http://content.dementiazones.com.aspx?articleid=1201161   Electronically Signed   By: Ruel Favors M.D.   On: 02/14/2014 13:52   Dg Chest Port 1 View  02/13/2014   CLINICAL DATA:  Hypoxia  EXAM: PORTABLE CHEST - 1 VIEW  COMPARISON:  02/09/2014  FINDINGS: Cardiomediastinal silhouette is stable. No acute infiltrate or pleural effusion. No pulmonary edema. Bony thorax is stable.  IMPRESSION: No active disease.   Electronically Signed   By: Natasha Mead M.D.   On: 02/13/2014 16:58   Dg Chest Portable 1 View  02/09/2014   CLINICAL DATA:  Acute chest pain with shortness of breath, history of hypertension and systolic heart failure as well as peripheral vascular disease, alcohol abuse, gastric ulcers, anxiety and depression with chronic renal disease and personal history of congestive heart failure  EXAM: PORTABLE CHEST - 1 VIEW  COMPARISON:  01/14/2014  FINDINGS: Moderate cardiac enlargement. Moderate vascular congestion. Mild peribronchial cuffing and interstitial prominence. More focal hazy left suprahilar opacity. No pleural effusions.  IMPRESSION: Findings suggest mild cardiogenic interstitial pulmonary edema. Recommend radiographic follow-up to ensure resolution of left suprahilar opacity which likely represents mildly asymmetric edema.   Electronically Signed   By: Esperanza Heir M.D.   On: 02/09/2014 14:33    Scheduled Meds: . albuterol  2.5 mg Nebulization BID  . aspirin EC  325 mg Oral Daily  . budesonide-formoterol  2 puff Inhalation BID  . calcium acetate  667 mg Oral Q supper  . darbepoetin (ARANESP) injection - DIALYSIS  40 mcg Intravenous Q Tue-HD  . diltiazem  180 mg Oral Daily  . escitalopram  10 mg Oral Daily  . feeding supplement (RESOURCE BREEZE)  1 Container Oral TID BM  . ferric gluconate (FERRLECIT/NULECIT) IV  125 mg Intravenous Q T,Th,Sa-HD  . heparin  5,000 Units Subcutaneous 3 times per day  . isosorbide mononitrate   60 mg Oral Daily  . metoprolol tartrate  25 mg Oral BID  . metroNIDAZOLE  500 mg Oral 3 times per day  . multivitamin with minerals  1 tablet Oral Daily  . naltrexone  25 mg Oral Daily  . nicotine  7 mg Transdermal Daily  . pantoprazole  40 mg Oral Daily  . roflumilast  500 mcg Oral Daily  . sodium chloride  3 mL Intravenous Q12H  . thiamine  100 mg Oral Daily  . tiotropium  18 mcg Inhalation Daily  . traZODone  25 mg Oral QHS   Continuous Infusions:   Principal Problem:   Acute respiratory failure with hypoxia Active Problems:   Uncontrolled hypertension   CKD (chronic kidney disease) stage V requiring chronic dialysis   COPD (chronic obstructive pulmonary disease)   ETOH abuse   OSA (obstructive sleep apnea)   Atrial flutter   OSA on CPAP   Nicotine dependence   Chest pain   Aspiration pneumonia  Enteritis due to Clostridium difficile    Time spent: 40 minutes   [facility]  [facility] Pager [phone]. If 7PM-7AM, please contact night-coverage at [email or web address], password [facility] 02/17/2014, 12:41 PM  LOS: 8 days

## 2014-02-17 NOTE — Progress Notes (Signed)
Paged Dr. Susie CassetteAbrol to inform her of pt heart rate still (120s-130s) with spikes in the 150s. Per MD order 5mg  Lopressor given IV push. CN has made Rapid Response aware of happenings. Will continue to monitor and assess.

## 2014-02-17 NOTE — Progress Notes (Signed)
Subjective:  Feeling somewhat better  Reports vomiting last pm / tolerated hd yesterday   Objective Vital signs in last 24 hours: Filed Vitals:   02/17/14 0402 02/17/14 0754 02/17/14 0808 02/17/14 0851  BP: 149/66 110/56 114/63 120/63  Pulse: 65  117 147  Temp: 98.3 F (36.8 C)  98 F (36.7 C)   TempSrc: Oral  Oral   Resp: 17     Height:      Weight:      SpO2: 98%  100% 98%   Weight change: 0.3 kg (10.6 oz) Physical Exam:  General: alert, nad watching tv Heart:RRR, no rub,mur ,or gallop  Lungs:CTA bilat  Abdomen: Bs pos. Soft, epigastric tender mildly, ND  Extremities:no pedal edema  Dialysis Access: pos bruit LUA AVF   Dialysis Orders: TTS @ [facility]  66.5 kg 4 hrs 3K/2.25Ca 400/A1.5 Heparin 3000 U AVF @ LUA  No Hectorol Aranesp 25 mcg on Tues. Venofer 100 mg x 10 (through 10/20   Problem/Plan:  1. Acute respiratory failure / volume overload- resolved with HD, ~ 2 kg beolw edw 2. Atrial flutter - new onset, SR on Telem./asymptomatic,on dilt / MTP, poor candidate for Coumadin 3. ESRD on HD - HD  On schedule TTS now 4. HTN/volume - BP 120/63 this am/  on Cardizem 180 mg qd, Metoprolol 25 mg bid;At home 4 bp meds( Will not need that many) and now bp lowish On meds for A fib.  5  Anemia - Hgb 8.9,> 10.6>8.8  Aranesp ^ 40 mcg in hosp mcg on Tues / Loading  Venofer 100 mg x 10  6 . Sec HPT - Ca 8.8 (10.0 corrected), P 1.6>2.4, iPTH 87; no Hectorol, Phoslo now ONLY with Supper.also on calcium carb tid dc 7.  C. Diff pos.+- on Flagly po / stopped clindamycin 8  .Nutrition - Alb 2.7>2.5, renal diet, multivitamin. Add protein supplement 9  Alcohol abuse - outpatient rehab pending.stated he will seek support at op /"was previously AAA" / Psyc seeing/on Thiamine 10  Depression - with recent suicidal ideation, seen by Psychiatry, on Lexapro.  11. COPD - tobacco abuse,  nicotine patch.  12  ASCVD - Hx NSTEMI, R renal artery stenosis w/ stent. Card  STRESS TEST  This admit= No sichemia , nl  ef  13.  OSA - noncompliant with CPAP reports claustrophobic.  14.  Deconditioning - started PT   Lenny Pastelavid Zeyfang, PA-C [facility] Beeper [phone] 02/17/2014,9:45 AM  LOS: 8 days   Pt seen, examined and agree w A/P as above.  Vinson Moselleob Sacora Hawbaker MD pager [phone]    cell [phone] 02/17/2014, 1:50 PM    Labs: Basic Metabolic Panel:  Recent Labs Lab 02/12/14 0349 02/13/14 0236 02/15/14 1243 02/16/14 0900  NA 136* 136* 131* 134*  K 3.8 3.7 3.7 4.9  CL 91* 94* 90* 96  CO2 30 27 19 19   GLUCOSE 112* 108* 163* 219*  BUN 31* 25* 64* 39*  CREATININE 4.24* 3.29* 9.22* 6.76*  CALCIUM 9.3 8.7 9.0 8.8  PHOS 3.3 1.6* 2.4  --    Liver Function Tests:  Recent Labs Lab 02/12/14 0349 02/13/14 0236 02/15/14 1243  ALBUMIN 2.8* 2.7* 2.5*  CBC:  Recent Labs Lab 02/11/14 0758 02/12/14 0349 02/13/14 0236 02/15/14 1243  WBC 13.7* 10.4 13.4* 14.7*  NEUTROABS  --  8.2* 10.6*  --   HGB 8.8* 9.1* 8.9* 8.8*  HCT 25.8* 27.7* 27.6* 26.7*  MCV 96.3 100.0 101.8* 98.9  PLT 293 288 298 338  Cardiac Enzymes:  Recent Labs Lab 02/10/14 1040  TROPONINI 0.35*   Studies/Results: No results found. Medications:   . albuterol  2.5 mg Nebulization BID  . aspirin EC  325 mg Oral Daily  . budesonide-formoterol  2 puff Inhalation BID  . calcium acetate  667 mg Oral Q supper  . darbepoetin (ARANESP) injection - DIALYSIS  40 mcg Intravenous Q Tue-HD  . diltiazem  180 mg Oral Daily  . escitalopram  10 mg Oral Daily  . feeding supplement (RESOURCE BREEZE)  1 Container Oral TID BM  . ferric gluconate (FERRLECIT/NULECIT) IV  125 mg Intravenous Q T,Th,Sa-HD  . heparin  5,000 Units Subcutaneous 3 times per day  . isosorbide mononitrate  60 mg Oral Daily  . metoprolol tartrate  25 mg Oral BID  . metroNIDAZOLE  500 mg Oral 3 times per day  . multivitamin with minerals  1 tablet Oral Daily  . naltrexone  25 mg Oral Daily  . nicotine  7 mg Transdermal Daily  . pantoprazole  40 mg  Oral Daily  . roflumilast  500 mcg Oral Daily  . sodium chloride  3 mL Intravenous Q12H  . thiamine  100 mg Oral Daily  . tiotropium  18 mcg Inhalation Daily  . traZODone  25 mg Oral QHS

## 2014-02-17 NOTE — Progress Notes (Signed)
Paged Dr. Susie CassetteAbrol d/t pt running sinus tach (130s-140s) on tele. BP 114/63. Per Md order EKG done and Diltiazem 5mg  given IV push. CN made aware of happenings. Will continue to monitor.

## 2014-02-18 MED ORDER — THIAMINE HCL 100 MG PO TABS: 100.0000 mg | ORAL_TABLET | Freq: Every day | ORAL | Status: AC

## 2014-02-18 MED ORDER — ASPIRIN 325 MG PO TBEC: 325.0000 mg | DELAYED_RELEASE_TABLET | Freq: Every day | ORAL | Status: AC

## 2014-02-18 MED ORDER — NALTREXONE HCL 50 MG PO TABS: 25.0000 mg | ORAL_TABLET | Freq: Every day | ORAL | Status: DC

## 2014-02-18 MED ORDER — FAMOTIDINE 20 MG PO TABS: 20.0000 mg | ORAL_TABLET | Freq: Two times a day (BID) | ORAL | Status: AC

## 2014-02-18 MED ORDER — METRONIDAZOLE 500 MG PO TABS: 500.0000 mg | ORAL_TABLET | Freq: Three times a day (TID) | ORAL | Status: DC

## 2014-02-18 MED ORDER — DILTIAZEM HCL ER COATED BEADS 180 MG PO CP24: 180.0000 mg | ORAL_CAPSULE | Freq: Every day | ORAL | Status: AC

## 2014-02-18 MED ORDER — HYDROCODONE-ACETAMINOPHEN 5-325 MG PO TABS: 1.0000 | ORAL_TABLET | Freq: Two times a day (BID) | ORAL | Status: DC | PRN

## 2014-02-18 MED ORDER — ESCITALOPRAM OXALATE 10 MG PO TABS: 10.0000 mg | ORAL_TABLET | Freq: Every day | ORAL | Status: DC

## 2014-02-18 MED ORDER — PANTOPRAZOLE SODIUM 40 MG PO TBEC
40.0000 mg | DELAYED_RELEASE_TABLET | Freq: Every day | ORAL | Status: DC
Start: 2014-02-18 — End: 2014-02-18

## 2014-02-18 MED ORDER — NICOTINE 7 MG/24HR TD PT24: 7.0000 mg | MEDICATED_PATCH | Freq: Every day | TRANSDERMAL | Status: DC

## 2014-02-18 MED ORDER — METOPROLOL TARTRATE 25 MG PO TABS
25.0000 mg | ORAL_TABLET | Freq: Two times a day (BID) | ORAL | Status: AC
Start: 2014-02-18 — End: ?

## 2014-02-18 MED ORDER — ALPRAZOLAM 0.5 MG PO TABS: 0.5000 mg | ORAL_TABLET | Freq: Three times a day (TID) | ORAL | Status: DC | PRN

## 2014-02-18 MED ORDER — TRAZODONE 25 MG HALF TABLET: 25.0000 mg | ORAL_TABLET | Freq: Every evening | ORAL | Status: AC | PRN

## 2014-02-18 NOTE — Treatment Plan (Signed)
      Albert BrasilDouglas E Massey  MRN: [id number]  DOB/AGE: 53/06/1960 53 y.o.      To Whom It May Concern Patient was admitted in the hospital from 02/09/14-02/18/14 Patient is being discharged on 02/18/14 Please contact [phone] for further questions  Signed:  [facility],Teniya Filter  02/18/2014, 12:01 PM

## 2014-02-18 NOTE — Progress Notes (Signed)
Subjective:  No further nausea or vomiting, but continues to have watery diarrhea after eating and abdominal cramping, liquids only Appears that primary service is sending out today  Objective: Vital signs in last 24 hours: Temp:  [97.4 F (36.3 C)-98.2 F (36.8 C)] 97.4 F (36.3 C) (10/12 0548) Pulse Rate:  [80-103] 84 (10/12 0548) Resp:  [18-19] 18 (10/12 0548) BP: (93-124)/(54-81) 93/54 mmHg (10/12 0548) SpO2:  [96 %-100 %] 96 % (10/12 0548) Weight:  [63.549 kg (140 lb 1.6 oz)] 63.549 kg (140 lb 1.6 oz) (10/11 2131) Weight change: -3.051 kg (-6 lb 11.6 oz)  Intake/Output from previous day: 10/11 0701 - 10/12 0700 In: 600 [P.O.:600] Out: -  Intake/Output this shift: Total I/O In: 240 [P.O.:240] Out: 0   Lab Results:  Recent Labs  02/15/14 1243  WBC 14.7*  HGB 8.8*  HCT 26.7*  PLT 338   BMET:  Recent Labs  02/15/14 1243 02/16/14 0900  NA 131* 134*  K 3.7 4.9  CL 90* 96  CO2 19 19  GLUCOSE 163* 219*  BUN 64* 39*  CREATININE 9.22* 6.76*  CALCIUM 9.0 8.8  ALBUMIN 2.5*  --    No results found for this basename: PTH,  in the last 72 hours Iron Studies: No results found for this basename: IRON, TIBC, TRANSFERRIN, FERRITIN,  in the last 72 hours  Studies/Results: No results found.  EXAM:  General appearance: Alert, in no apparent distress OOB looking out the window. Ambulatory in the room. Resp: CTA without rales, rhonchi, or wheezes  Cardio: RRR without murmur or rub  GI: + BS, soft with general tenderness Extremities: No edema  Access: AVF @ LUA with + bruit   Dialysis Orders: TTS @ [facility]  66.5 kg 4 hrs 3K/2.25Ca 400/A1.5 Heparin 3000 U AVF @ LUA  No Hectorol Aranesp 25 mcg on Tues. Venofer 100 mg x 10 (through 10/20  Assessment/Plan: 1. Acute respiratory failure - sec volume overload, resolved. 2. Atrial flutter - new onset, asymptomatic, spontaneously converted to SR; on PO Cardizem, Metoprolol per Cardiology.  3. Diarrhea - C diff + 10/8, on PO  Flagyl. For 4 weeks of Rx 4. ESRD - HD on TTS @ [facility], K 4.9. HD tomorrow.orders written in case not discharged today  5. HTN/volume - BP 93/54 now on Cardizem 120 mg qd, Metoprolol 25 mg bid, previously on 4 outpatient meds, now only for rate control; wt 63.5 kg, below EDW. 6. Anemia - Hgb 8.8, Aranesp increased to 40 mcg on Tues, Venofer 100 mg x 10. 7. Sec HPT - Ca 8.8 (10 corrected), P 2.4, iPTH 87; no Hectorol, Phoslo with meals.  8. Nutrition - Alb 2.5, renal diet, multivitamin.  9. Alcohol abuse - outpatient rehab pending as outpatient..  10. Depression - with recent suicidal ideation, seen by Psychiatry, on Lexapro.  11. COPD - tobacco abuse; nicotine patch.  12. ASCVD - Hx NSTEMI, R renal artery stenosis w/ stent.  13. OSA - noncompliant with CPAP, claustrophobic.  14. Debility - started PT.     LOS: 9 days   [name],[name] 02/18/2014,8:57 AM I have seen and examined this patient and agree with plan as above with highlighted additions. Appears will be discharged in the next day or so.  (? Today?) Dialysis orders written in case not sent out today.  Zephaniah Lubrano B,MD 02/18/2014 12:39 PM

## 2014-02-18 NOTE — Progress Notes (Signed)
Physical Therapy Treatment Patient Details Name: [name] MRN: [id number] DOB: 02/03/1961 Today's Date: 02/18/2014    History of Present Illness 53 y/o male  h/o OSA, former smoker, Diastolic HF, ESRD TTS, PAD, COPD, Prior R Renal artery stent, poorly controlled Htn presented from dialysis Center with a multitude of complaints. Found to have Hypoxic respiratory failure, new onset A flutter, SOB with missed dialysis    PT Comments    Overall managing better; VSS with ambulation on Room Air; OK for dc home from PT standpoint.  Follow Up Recommendations  No PT follow up     Equipment Recommendations  None recommended by PT    Recommendations for Other Services       Precautions / Restrictions Precautions Precautions: None    Mobility  Bed Mobility Overal bed mobility: Independent                Transfers Overall transfer level: Independent Equipment used: None Transfers: Sit to/from Stand Sit to Stand: Modified independent (Device/Increase time)         General transfer comment: mild instability atinitial stand; able to stead self with hand on the bed  Ambulation/Gait Ambulation/Gait assistance: Supervision Ambulation Distance (Feet): 200 Feet Assistive device: None   Gait velocity: decreasec   General Gait Details: Supervision for safety; bil LEs quite externally rotated in stance; VSS; no gross LOB noted   Stairs Stairs: Yes Stairs assistance: Min guard Stair Management: One rail Right;Forwards;Step to pattern Number of Stairs: 12 General stair comments: Confident with steps today  Wheelchair Mobility    Modified Rankin (Stroke Patients Only)       Balance                                    Cognition Arousal/Alertness: Awake/alert Behavior During Therapy: WFL for tasks assessed/performed Overall Cognitive Status: Within Functional Limits for tasks assessed                      Exercises      General  Comments        Pertinent Vitals/Pain Pain Assessment: No/denies pain    Home Living                      Prior Function            PT Goals (current goals can now be found in the care plan section) Acute Rehab PT Goals Patient Stated Goal: home PT Goal Formulation: With patient Time For Goal Achievement: 02/26/14 Potential to Achieve Goals: Fair Progress towards PT goals: Progressing toward goals    Frequency  Min 3X/week    PT Plan Current plan remains appropriate    Co-evaluation             End of Session Equipment Utilized During Treatment: Gait belt Activity Tolerance: Patient tolerated treatment well Patient left: in bed;with call bell/phone within reach (sitting EOB)     Time: [id number] PT Time Calculation (min): 17 min  Charges:  $Gait Training: 8-22 mins                    G Codes:      Olen PelGarrigan, Aeriana Speece Hamff 02/18/2014, 12:47 PM  Van ClinesHolly Myley Bahner, [place]PT  Acute Rehabilitation Services Pager [phone] Office [phone]

## 2014-02-18 NOTE — Discharge Summary (Signed)
Physician Discharge Summary  Albert BrasilDouglas E Massey MRN: [id number] DOB/AGE: 53/06/1960 53 y.o.  PCP: Wilmer FloorAMPBELL, STEPHEN D., MD   Admit date: 02/09/2014 Discharge date: 02/18/2014  Discharge Diagnoses:  C. difficile colitis   Acute respiratory failure with hypoxia MAT/atrial flutter   Uncontrolled hypertension   CKD (chronic kidney disease) stage V requiring chronic dialysis   COPD (chronic obstructive pulmonary disease)   ETOH abuse   OSA (obstructive sleep apnea)   Atrial flutter   OSA on CPAP   Nicotine dependence   Chest pain   Aspiration pneumonia   Follow up recommendations recommend followup with PCP in 5-7 days Followup CBC, CMP in 1 week      Medication List    STOP taking these medications       hydrALAZINE 100 MG tablet  Commonly known as:  APRESOLINE      TAKE these medications       albuterol 108 (90 BASE) MCG/ACT inhaler  Commonly known as:  PROVENTIL HFA;VENTOLIN HFA  Inhale 2 puffs into the lungs every 6 (six) hours as needed for wheezing or shortness of breath.     ALPRAZolam 0.5 MG tablet  Commonly known as:  XANAX  Take 1 tablet (0.5 mg total) by mouth 3 (three) times daily as needed for anxiety.     amLODipine 10 MG tablet  Commonly known as:  NORVASC  Take 10 mg by mouth at bedtime.     aspirin 325 MG EC tablet  Take 1 tablet (325 mg total) by mouth daily.     budesonide-formoterol 80-4.5 MCG/ACT inhaler  Commonly known as:  SYMBICORT  Inhale 2 puffs into the lungs 2 (two) times daily.     calcium acetate 667 MG capsule  Commonly known as:  PHOSLO  Take 667 mg by mouth daily with supper.     calcium carbonate 750 MG chewable tablet  Commonly known as:  TUMS EX  Chew 1 tablet by mouth 2 (two) times daily as needed for heartburn.     cloNIDine 0.2 MG tablet  Commonly known as:  CATAPRES  Take 0.2 mg by mouth 2 (two) times daily.     diltiazem 180 MG 24 hr capsule  Commonly known as:  CARDIZEM CD  Take 1 capsule (180 mg total)  by mouth daily.     diphenhydrAMINE 25 MG tablet  Commonly known as:  BENADRYL  Take 25 mg by mouth every 6 (six) hours as needed for itching.     escitalopram 10 MG tablet  Commonly known as:  LEXAPRO  Take 1 tablet (10 mg total) by mouth daily.     famotidine 20 MG tablet  Commonly known as:  PEPCID  Take 1 tablet (20 mg total) by mouth 2 (two) times daily.     HYDROcodone-acetaminophen 5-325 MG per tablet  Commonly known as:  NORCO/VICODIN  Take 1 tablet by mouth 2 (two) times daily as needed for moderate pain.     isosorbide mononitrate 60 MG 24 hr tablet  Commonly known as:  IMDUR  Take 60 mg by mouth daily as needed (blood pressure close to 200).     metoprolol tartrate 25 MG tablet  Commonly known as:  LOPRESSOR  Take 1 tablet (25 mg total) by mouth 2 (two) times daily.     metroNIDAZOLE 500 MG tablet  Commonly known as:  FLAGYL  Take 1 tablet (500 mg total) by mouth 3 (three) times daily.     multivitamin with minerals Tabs  tablet  Take 1 tablet by mouth daily.     naltrexone 50 MG tablet  Commonly known as:  DEPADE  Take 0.5 tablets (25 mg total) by mouth daily.     nicotine 7 mg/24hr patch  Commonly known as:  NICODERM CQ - dosed in mg/24 hr  Place 1 patch (7 mg total) onto the skin daily.     roflumilast 500 MCG Tabs tablet  Commonly known as:  DALIRESP  Take 500 mcg by mouth daily.     thiamine 100 MG tablet  Take 1 tablet (100 mg total) by mouth daily.     tiotropium 18 MCG inhalation capsule  Commonly known as:  SPIRIVA  Place 18 mcg into inhaler and inhale daily.     traZODone 25 mg Tabs tablet  Commonly known as:  DESYREL  Take 0.5 tablets (25 mg total) by mouth at bedtime as needed for sleep.     vitamin C 500 MG tablet  Commonly known as:  ASCORBIC ACID  Take 500 mg by mouth daily.        Discharge Condition: *Stable Disposition: 01-Home or Self Care   Consults:  Cardiology Nephrology Psychiatry  Significant Diagnostic  Studies: Nm Myocar Multi W/spect W/wall Motion / Ef  02/14/2014   CLINICAL DATA:  Acute chest pain with hypertension, COPD, CHF  EXAM: MYOCARDIAL IMAGING WITH SPECT (REST AND PHARMACOLOGIC-STRESS)  GATED LEFT VENTRICULAR WALL MOTION STUDY  LEFT VENTRICULAR EJECTION FRACTION  TECHNIQUE: Standard myocardial SPECT imaging was performed after resting intravenous injection of 10 mCi Tc-5044m sestamibi. Subsequently, intravenous infusion of Lexiscan was performed under the supervision of the Cardiology staff. At peak effect of the drug, 30 mCi Tc-8144m sestamibi was injected intravenously and standard myocardial SPECT imaging was performed. Quantitative gated imaging was also performed to evaluate left ventricular wall motion, and estimate left ventricular ejection fraction.  COMPARISON:  None.  FINDINGS: Perfusion: No decreased activity in the left ventricle on stress imaging to suggest reversible ischemia or infarction.  Wall Motion: Normal left ventricular wall motion. No left ventricular dilation.  Left Ventricular Ejection Fraction: 55 %  End diastolic volume 133 ml  End systolic volume 60 ml  IMPRESSION: 1. No reversible ischemia or infarction.  2. Normal left ventricular wall motion.  3. Left ventricular ejection fraction 55%  4. Low-risk stress test findings*.  *2012 Appropriate Use Criteria for Coronary Revascularization Focused Update: J Am Coll Cardiol. 2012;59(9):857-881. http://content.dementiazones.comonlinejacc.org/article.aspx?articleid=1201161   Electronically Signed   By: Ruel Favorsrevor  Shick M.D.   On: 02/14/2014 13:52   Dg Chest Port 1 View  02/13/2014   CLINICAL DATA:  Hypoxia  EXAM: PORTABLE CHEST - 1 VIEW  COMPARISON:  02/09/2014  FINDINGS: Cardiomediastinal silhouette is stable. No acute infiltrate or pleural effusion. No pulmonary edema. Bony thorax is stable.  IMPRESSION: No active disease.   Electronically Signed   By: Natasha MeadLiviu  Pop M.D.   On: 02/13/2014 16:58   Dg Chest Portable 1 View  02/09/2014   CLINICAL DATA:   Acute chest pain with shortness of breath, history of hypertension and systolic heart failure as well as peripheral vascular disease, alcohol abuse, gastric ulcers, anxiety and depression with chronic renal disease and personal history of congestive heart failure  EXAM: PORTABLE CHEST - 1 VIEW  COMPARISON:  01/14/2014  FINDINGS: Moderate cardiac enlargement. Moderate vascular congestion. Mild peribronchial cuffing and interstitial prominence. More focal hazy left suprahilar opacity. No pleural effusions.  IMPRESSION: Findings suggest mild cardiogenic interstitial pulmonary edema. Recommend radiographic follow-up to ensure  resolution of left suprahilar opacity which likely represents mildly asymmetric edema.   Electronically Signed   By: Esperanza Heir M.D.   On: 02/09/2014 14:33      Microbiology: Recent Results (from the past 240 hour(s))  CULTURE, BLOOD (ROUTINE X 2)     Status: None   Collection Time    02/09/14  3:03 PM      Result Value Ref Range Status   Specimen Description BLOOD RIGHT HAND   Final   Special Requests BOTTLES DRAWN AEROBIC AND ANAEROBIC 4CC   Final   Culture  Setup Time     Final   Value: 02/09/2014 20:32     Performed at [facility]   Culture     Final   Value: NO GROWTH 5 DAYS     Performed at [facility]   Report Status 02/15/2014 FINAL   Final  CULTURE, BLOOD (ROUTINE X 2)     Status: None   Collection Time    02/09/14  3:46 PM      Result Value Ref Range Status   Specimen Description BLOOD RIGHT FOREARM   Final   Special Requests BOTTLES DRAWN AEROBIC ONLY 5CC   Final   Culture  Setup Time     Final   Value: 02/09/2014 21:45     Performed at [facility]   Culture     Final   Value: STAPHYLOCOCCUS SPECIES (COAGULASE NEGATIVE)     Note: THE SIGNIFICANCE OF ISOLATING THIS ORGANISM FROM A SINGLE SET OF BLOOD CULTURES WHEN MULTIPLE SETS ARE DRAWN IS UNCERTAIN. PLEASE NOTIFY THE MICROBIOLOGY DEPARTMENT WITHIN ONE WEEK IF SPECIATION AND  SENSITIVITIES ARE REQUIRED.     Note: Gram Stain Report Called to,Read Back By and Verified With: KENYETTA EASON 02/10/14 1515 BY SMITHERSJ     Performed at [facility]   Report Status 02/11/2014 FINAL   Final  MRSA PCR SCREENING     Status: None   Collection Time    02/09/14 10:38 PM      Result Value Ref Range Status   MRSA by PCR NEGATIVE  NEGATIVE Final   Comment:            The GeneXpert MRSA Assay (FDA     approved for NASAL specimens     only), is one component of a     comprehensive MRSA colonization     surveillance program. It is not     intended to diagnose MRSA     infection nor to guide or     monitor treatment for     MRSA infections.  CLOSTRIDIUM DIFFICILE BY PCR     Status: Abnormal   Collection Time    02/14/14  8:05 AM      Result Value Ref Range Status   C difficile by pcr POSITIVE (*) NEGATIVE Final   Comment: CRITICAL RESULT CALLED TO, READ BACK BY AND VERIFIED WITH:     T. HUBBARD RN 10:20 02/14/14 ([email or web address])     Labs: No results found for this or any previous visit (from the past 48 hour(s)).   HPI :* Brief narrative:  [name] is a 53 y.o. male with OSA, COPD FEV1 57%, Diastolic CHF, ESRD on HD TTS, PAD s/p renal artery stent, HTN, ETOH abuse, smoker presenting from dialysis center (did not receive dialysis) with hypoxia,dyspnea, chest pain found to be in A-flutter. He had missed his last dialysis because he was depressed and drinking. He also  had some nausea and vomiting.  He underwent emergent dialysis on 10/3. Transferred to step down unit at 10 PM for chest pain which resolved after Morphine and Nitro. Cardiology consulted on 10/5- not a candidate for NOAC due to anemia and ETOH abuse- EF 60-65 %- no need for cardioversion. Mild Troponin elevation noted with T max of 0.43 , found to have intermittent atrial flutter/A. fib/MAT. during this hospitalization He has stated that he is having severe craving for alcohol and insist that he won't be  able to go back home with fear of immediate relapse of alcohol dependence. He has compliant with his medication treatment and hemodialysis without incidents. He has been depressed and self medicating with alcohol since his wife died with acute myocardial infarction about five years ago. He admits suicidal ideation at admission but denied suicide thoughts, intention or plans at this time. He spoke with his older daughter who visited him and has plans to spend time with his grand children.  He has history of attending psychosocial rehab groups in [facility] recovery in [place] and hoping to get into 28 days program in [place] hospital substance abuse program.    HOSPITAL COURSE:  Acute respiratory failure with hypoxia  - due to fluid overload from missing dialysis- resolved with dialysis treatment  ? HCAP vs Aspiration was treated with Clindamycin- subsequently Clindamycin was discontinued because of C. difficile     C diff with abdominal cramping  - started having loose stools , confirmed to have C. difficile- started on oral Flagyl continue for another 4 weeks, tolerating regular diet- still have some intermittent abdominal cramping Diarrhea resolved  Atrial flutter /atrial fibrillation with rapid ventricular response   resolved with IV Cardizem and IV metoprolol pushes, diltiazem increased to 180 mg , patient also currently on metoprolol started during this hospitalization Not a candidate for anticoagulation due to ETOH  poor candidate for Coumadin  Repeat EKG showed patient to be in sinus rhythm   Had MAT 10/9  related to underlying illness.  - resolved   Chest pain  -with mildly elevated Troponins- demand ischemia- Myoview negative- see cardiology notes   Uncontrolled hypertension  Continue on Cardizem, dose increased today (on Amlodipine as outpt), Imdur and Metoprolol  - outpt Hydralazine and Clonidine held in favor of above   CKD (chronic kidney disease) stage V requiring chronic  dialysis  - dialysis per renal team , next hemodialysis 10/13   Anemia  - due to CKD and Iron deficiency- Iron being replaced by renal team   COPD (chronic obstructive pulmonary disease)  - on Albuterol, Roflumilast, Symbicort and Spiriva    ETOH abuse  - no longer in withdrawal-  - willing to go to residential to rehab  Seen by psychiatry naltrexone 25 mg PO QAm for substance cravings  Continue Trazodone 25 mg PO Qhs for isomnia Refer to residential substance abuse rehab treatment made, information provided  Depression - with recent suicidal ideation, seen by Psychiatry, on Lexapro  Increased Lexapro 10 mg PO Qam for depression and anxiety by psychiatry  OSA (obstructive sleep apnea)  - CPAP   Nicotine dependence  - nicotine patch prescription provided      Discharge Exam: Blood pressure 126/76, pulse 88, temperature 97.8 F (36.6 C), temperature source Oral, resp. rate 18, height 5\' 7"  (1.702 m), weight 63.549 kg (140 lb 1.6 oz), SpO2 100.00%.  General: alert, nad watching tv  Heart:RRR, no rub,mur ,or gallop  Lungs:CTA bilat  Abdomen: Bs pos. Soft, epigastric  tender mildly, ND  Extremities:no pedal edema  Dialysis Access: pos bruit LUA AVF         Discharge Instructions   Diet - low sodium heart healthy    Complete by:  As directed      Increase activity slowly    Complete by:  As directed            Follow-up Information   Follow up with CAMPBELL, STEPHEN D., MD. Schedule an appointment as soon as possible for a visit in 1 week.   Specialty:  Internal Medicine   Contact information:   [address] [place] [address] [phone]       Signed: Richarda Overlie 02/18/2014, 12:01 PM

## 2014-02-18 NOTE — Progress Notes (Signed)
Patient Discharge: Disposition: Patient discharged home. Education: Patient educated on discharge instructions, medications, prescriptions, follow-up appointments, hand out given on acute respiratory failure, understood and acknowledged. IV: Peripheral IV removed before discharge. Telemetry: Removed before discharge and CCMD notified. Transportation: Patient transported in w/c accompanied by staff member. Belongings: Patient took all his belongings with him.

## 2014-02-28 ENCOUNTER — Encounter (HOSPITAL_COMMUNITY): Payer: Self-pay | Admitting: General Practice

## 2014-02-28 ENCOUNTER — Inpatient Hospital Stay (HOSPITAL_COMMUNITY)
Admission: AD | Admit: 2014-02-28 | Discharge: 2014-03-03 | DRG: 557 | Disposition: A | Discharge status: Home / Self Care | Payer: Medicaid Other | Source: Other Acute Inpatient Hospital | Attending: Internal Medicine | Admitting: Internal Medicine

## 2014-02-28 DIAGNOSIS — M545 Low back pain: Secondary | ICD-10-CM | POA: Diagnosis present

## 2014-02-28 DIAGNOSIS — A0472 Enterocolitis due to Clostridium difficile, not specified as recurrent: Secondary | ICD-10-CM

## 2014-02-28 DIAGNOSIS — D72829 Elevated white blood cell count, unspecified: Secondary | ICD-10-CM | POA: Diagnosis present

## 2014-02-28 DIAGNOSIS — M79604 Pain in right leg: Secondary | ICD-10-CM | POA: Diagnosis present

## 2014-02-28 DIAGNOSIS — Z8249 Family history of ischemic heart disease and other diseases of the circulatory system: Secondary | ICD-10-CM | POA: Diagnosis not present

## 2014-02-28 DIAGNOSIS — F419 Anxiety disorder, unspecified: Secondary | ICD-10-CM | POA: Diagnosis present

## 2014-02-28 DIAGNOSIS — D631 Anemia in chronic kidney disease: Secondary | ICD-10-CM | POA: Diagnosis present

## 2014-02-28 DIAGNOSIS — F1010 Alcohol abuse, uncomplicated: Secondary | ICD-10-CM | POA: Diagnosis present

## 2014-02-28 DIAGNOSIS — G8929 Other chronic pain: Secondary | ICD-10-CM | POA: Diagnosis present

## 2014-02-28 DIAGNOSIS — I701 Atherosclerosis of renal artery: Secondary | ICD-10-CM | POA: Diagnosis present

## 2014-02-28 DIAGNOSIS — Z9889 Other specified postprocedural states: Secondary | ICD-10-CM | POA: Diagnosis not present

## 2014-02-28 DIAGNOSIS — Z7982 Long term (current) use of aspirin: Secondary | ICD-10-CM

## 2014-02-28 DIAGNOSIS — R000 Tachycardia, unspecified: Secondary | ICD-10-CM | POA: Diagnosis present

## 2014-02-28 DIAGNOSIS — F329 Major depressive disorder, single episode, unspecified: Secondary | ICD-10-CM | POA: Diagnosis present

## 2014-02-28 DIAGNOSIS — N186 End stage renal disease: Secondary | ICD-10-CM | POA: Diagnosis present

## 2014-02-28 DIAGNOSIS — I4581 Long QT syndrome: Secondary | ICD-10-CM | POA: Diagnosis present

## 2014-02-28 DIAGNOSIS — M79605 Pain in left leg: Secondary | ICD-10-CM | POA: Diagnosis present

## 2014-02-28 DIAGNOSIS — G2581 Restless legs syndrome: Secondary | ICD-10-CM | POA: Diagnosis present

## 2014-02-28 DIAGNOSIS — I739 Peripheral vascular disease, unspecified: Secondary | ICD-10-CM | POA: Diagnosis present

## 2014-02-28 DIAGNOSIS — Z9989 Dependence on other enabling machines and devices: Secondary | ICD-10-CM

## 2014-02-28 DIAGNOSIS — B1920 Unspecified viral hepatitis C without hepatic coma: Secondary | ICD-10-CM | POA: Diagnosis present

## 2014-02-28 DIAGNOSIS — E785 Hyperlipidemia, unspecified: Secondary | ICD-10-CM | POA: Diagnosis present

## 2014-02-28 DIAGNOSIS — R531 Weakness: Secondary | ICD-10-CM | POA: Diagnosis present

## 2014-02-28 DIAGNOSIS — Z79899 Other long term (current) drug therapy: Secondary | ICD-10-CM

## 2014-02-28 DIAGNOSIS — Z8711 Personal history of peptic ulcer disease: Secondary | ICD-10-CM

## 2014-02-28 DIAGNOSIS — Z992 Dependence on renal dialysis: Secondary | ICD-10-CM | POA: Diagnosis not present

## 2014-02-28 DIAGNOSIS — G4733 Obstructive sleep apnea (adult) (pediatric): Secondary | ICD-10-CM | POA: Diagnosis present

## 2014-02-28 DIAGNOSIS — J302 Other seasonal allergic rhinitis: Secondary | ICD-10-CM | POA: Diagnosis present

## 2014-02-28 DIAGNOSIS — I120 Hypertensive chronic kidney disease with stage 5 chronic kidney disease or end stage renal disease: Secondary | ICD-10-CM | POA: Diagnosis present

## 2014-02-28 DIAGNOSIS — J42 Unspecified chronic bronchitis: Secondary | ICD-10-CM

## 2014-02-28 DIAGNOSIS — J449 Chronic obstructive pulmonary disease, unspecified: Secondary | ICD-10-CM | POA: Diagnosis present

## 2014-02-28 DIAGNOSIS — F17210 Nicotine dependence, cigarettes, uncomplicated: Secondary | ICD-10-CM | POA: Diagnosis present

## 2014-02-28 DIAGNOSIS — M722 Plantar fascial fibromatosis: Secondary | ICD-10-CM | POA: Diagnosis present

## 2014-02-28 DIAGNOSIS — I5022 Chronic systolic (congestive) heart failure: Secondary | ICD-10-CM | POA: Diagnosis present

## 2014-02-28 DIAGNOSIS — I10 Essential (primary) hypertension: Secondary | ICD-10-CM | POA: Diagnosis present

## 2014-02-28 DIAGNOSIS — A047 Enterocolitis due to Clostridium difficile: Secondary | ICD-10-CM | POA: Diagnosis present

## 2014-02-28 DIAGNOSIS — J9601 Acute respiratory failure with hypoxia: Secondary | ICD-10-CM

## 2014-02-28 LAB — CBC WITH DIFFERENTIAL/PLATELET
BASOS PCT: 0 % (ref 0–1)
Basophils Absolute: 0 10*3/uL (ref 0.0–0.1)
EOS ABS: 0 10*3/uL (ref 0.0–0.7)
Eosinophils Relative: 0 % (ref 0–5)
HCT: 27.4 % — ABNORMAL LOW (ref 39.0–52.0)
Hemoglobin: 9.4 g/dL — ABNORMAL LOW (ref 13.0–17.0)
Lymphocytes Relative: 8 % — ABNORMAL LOW (ref 12–46)
Lymphs Abs: 1.5 10*3/uL (ref 0.7–4.0)
MCH: 33.7 pg (ref 26.0–34.0)
MCHC: 34.3 g/dL (ref 30.0–36.0)
MCV: 98.2 fL (ref 78.0–100.0)
MONOS PCT: 6 % (ref 3–12)
Monocytes Absolute: 1.1 10*3/uL — ABNORMAL HIGH (ref 0.1–1.0)
NEUTROS ABS: 15.6 10*3/uL — AB (ref 1.7–7.7)
NEUTROS PCT: 86 % — AB (ref 43–77)
Platelets: 362 10*3/uL (ref 150–400)
RBC: 2.79 MIL/uL — ABNORMAL LOW (ref 4.22–5.81)
RDW: 15.6 % — AB (ref 11.5–15.5)
WBC: 18.3 10*3/uL — ABNORMAL HIGH (ref 4.0–10.5)

## 2014-02-28 LAB — BASIC METABOLIC PANEL
Anion gap: 20 — ABNORMAL HIGH (ref 5–15)
BUN: 17 mg/dL (ref 6–23)
CALCIUM: 8.2 mg/dL — AB (ref 8.4–10.5)
CO2: 22 mEq/L (ref 19–32)
Chloride: 92 mEq/L — ABNORMAL LOW (ref 96–112)
Creatinine, Ser: 4.07 mg/dL — ABNORMAL HIGH (ref 0.50–1.35)
GFR calc Af Amer: 18 mL/min — ABNORMAL LOW (ref >90)
GFR, EST NON AFRICAN AMERICAN: 15 mL/min — AB (ref >90)
GLUCOSE: 106 mg/dL — AB (ref 70–99)
Potassium: 3.7 mEq/L (ref 3.7–5.3)
Sodium: 134 mEq/L — ABNORMAL LOW (ref 137–147)

## 2014-02-28 LAB — TROPONIN I

## 2014-02-28 LAB — VITAMIN B12: VITAMIN B 12: 598 pg/mL (ref 211–911)

## 2014-02-28 LAB — PROTIME-INR
INR: 1.3 (ref 0.00–1.49)
Prothrombin Time: 16.3 seconds — ABNORMAL HIGH (ref 11.6–15.2)

## 2014-02-28 LAB — PHOSPHORUS: Phosphorus: 3.3 mg/dL (ref 2.3–4.6)

## 2014-02-28 LAB — MAGNESIUM: MAGNESIUM: 2 mg/dL (ref 1.5–2.5)

## 2014-02-28 MED ORDER — MORPHINE SULFATE ER 15 MG PO TBCR: 30.0000 mg | EXTENDED_RELEASE_TABLET | Freq: Two times a day (BID) | ORAL | Status: DC

## 2014-02-28 MED ORDER — AMLODIPINE BESYLATE 10 MG PO TABS
10.0000 mg | ORAL_TABLET | Freq: Every day | ORAL | Status: DC
  Administered 2014-02-28: 10 mg via ORAL
  Filled 2014-02-28 (×2): qty 1

## 2014-02-28 MED ORDER — DILTIAZEM HCL ER COATED BEADS 180 MG PO CP24
180.0000 mg | ORAL_CAPSULE | Freq: Every day | ORAL | Status: DC
  Administered 2014-02-28 – 2014-03-03 (×3): 180 mg via ORAL
  Filled 2014-02-28 (×4): qty 1

## 2014-02-28 MED ORDER — ASPIRIN EC 325 MG PO TBEC
325.0000 mg | DELAYED_RELEASE_TABLET | Freq: Every day | ORAL | Status: DC
  Administered 2014-02-28 – 2014-03-03 (×4): 325 mg via ORAL
  Filled 2014-02-28 (×4): qty 1

## 2014-02-28 MED ORDER — SODIUM CHLORIDE 0.9 % IV SOLN: 250.0000 mL | INTRAVENOUS | Status: DC | PRN

## 2014-02-28 MED ORDER — HYDROXYZINE HCL 10 MG PO TABS
10.0000 mg | ORAL_TABLET | Freq: Three times a day (TID) | ORAL | Status: DC | PRN
  Filled 2014-02-28: qty 1

## 2014-02-28 MED ORDER — VITAMIN B-1 100 MG PO TABS
100.0000 mg | ORAL_TABLET | Freq: Every day | ORAL | Status: DC
  Filled 2014-02-28: qty 1

## 2014-02-28 MED ORDER — HEPARIN SODIUM (PORCINE) 5000 UNIT/ML IJ SOLN
5000.0000 [IU] | Freq: Three times a day (TID) | INTRAMUSCULAR | Status: DC
  Administered 2014-02-28 – 2014-03-03 (×10): 5000 [IU] via SUBCUTANEOUS
  Filled 2014-02-28 (×14): qty 1

## 2014-02-28 MED ORDER — VITAMIN B-1 100 MG PO TABS
100.0000 mg | ORAL_TABLET | Freq: Every day | ORAL | Status: DC
  Administered 2014-02-28 – 2014-03-03 (×4): 100 mg via ORAL
  Filled 2014-02-28 (×5): qty 1

## 2014-02-28 MED ORDER — BUDESONIDE-FORMOTEROL FUMARATE 80-4.5 MCG/ACT IN AERO
2.0000 | INHALATION_SPRAY | Freq: Two times a day (BID) | RESPIRATORY_TRACT | Status: DC
  Administered 2014-02-28 – 2014-03-03 (×5): 2 via RESPIRATORY_TRACT
  Filled 2014-02-28 (×2): qty 6.9

## 2014-02-28 MED ORDER — LORAZEPAM 2 MG/ML IJ SOLN
0.0000 mg | Freq: Four times a day (QID) | INTRAMUSCULAR | Status: AC
  Administered 2014-03-01: 2 mg via INTRAVENOUS
  Filled 2014-02-28: qty 1

## 2014-02-28 MED ORDER — HYDROMORPHONE HCL 1 MG/ML IJ SOLN
1.0000 mg | INTRAMUSCULAR | Status: DC | PRN
  Administered 2014-02-28 – 2014-03-02 (×7): 1 mg via INTRAVENOUS
  Filled 2014-02-28 (×10): qty 1

## 2014-02-28 MED ORDER — LORAZEPAM 1 MG PO TABS
1.0000 mg | ORAL_TABLET | Freq: Four times a day (QID) | ORAL | Status: AC | PRN
Start: 2014-02-28 — End: 2014-03-03
  Administered 2014-02-28 – 2014-03-02 (×2): 1 mg via ORAL
  Filled 2014-02-28 (×2): qty 1

## 2014-02-28 MED ORDER — METRONIDAZOLE 500 MG PO TABS
500.0000 mg | ORAL_TABLET | Freq: Three times a day (TID) | ORAL | Status: DC
Start: 2014-02-28 — End: 2014-03-03
  Administered 2014-02-28 – 2014-03-03 (×11): 500 mg via ORAL
  Filled 2014-02-28 (×13): qty 1

## 2014-02-28 MED ORDER — MORPHINE SULFATE ER 15 MG PO TBCR
15.0000 mg | EXTENDED_RELEASE_TABLET | Freq: Two times a day (BID) | ORAL | Status: DC
  Administered 2014-02-28 – 2014-03-03 (×8): 15 mg via ORAL
  Filled 2014-02-28 (×8): qty 1

## 2014-02-28 MED ORDER — TIOTROPIUM BROMIDE MONOHYDRATE 18 MCG IN CAPS
18.0000 ug | ORAL_CAPSULE | Freq: Every day | RESPIRATORY_TRACT | Status: DC
  Administered 2014-02-28 – 2014-03-01 (×2): 18 ug via RESPIRATORY_TRACT
  Filled 2014-02-28 (×2): qty 5

## 2014-02-28 MED ORDER — ADULT MULTIVITAMIN W/MINERALS CH
1.0000 | ORAL_TABLET | Freq: Every day | ORAL | Status: DC
  Administered 2014-02-28 – 2014-03-03 (×4): 1 via ORAL
  Filled 2014-02-28 (×4): qty 1

## 2014-02-28 MED ORDER — HYDROMORPHONE HCL 1 MG/ML IJ SOLN: 2.0000 mg | INTRAMUSCULAR | Status: DC | PRN

## 2014-02-28 MED ORDER — DARBEPOETIN ALFA-POLYSORBATE 100 MCG/0.5ML IJ SOLN: 80.0000 ug | INTRAMUSCULAR | Status: DC

## 2014-02-28 MED ORDER — NICOTINE 7 MG/24HR TD PT24
7.0000 mg | MEDICATED_PATCH | Freq: Every day | TRANSDERMAL | Status: DC
  Administered 2014-02-28 – 2014-03-03 (×4): 7 mg via TRANSDERMAL
  Filled 2014-02-28 (×4): qty 1

## 2014-02-28 MED ORDER — VITAMIN C 500 MG PO TABS
500.0000 mg | ORAL_TABLET | Freq: Every day | ORAL | Status: DC
  Administered 2014-02-28 – 2014-03-03 (×4): 500 mg via ORAL
  Filled 2014-02-28 (×5): qty 1

## 2014-02-28 MED ORDER — FOLIC ACID 1 MG PO TABS
1.0000 mg | ORAL_TABLET | Freq: Every day | ORAL | Status: DC
  Administered 2014-02-28 – 2014-03-03 (×4): 1 mg via ORAL
  Filled 2014-02-28 (×4): qty 1

## 2014-02-28 MED ORDER — SODIUM CHLORIDE 0.9 % IJ SOLN: 3.0000 mL | INTRAMUSCULAR | Status: DC | PRN

## 2014-02-28 MED ORDER — ALBUTEROL SULFATE (2.5 MG/3ML) 0.083% IN NEBU: 2.5000 mg | INHALATION_SOLUTION | Freq: Four times a day (QID) | RESPIRATORY_TRACT | Status: DC | PRN

## 2014-02-28 MED ORDER — LORAZEPAM 2 MG/ML IJ SOLN
0.0000 mg | Freq: Two times a day (BID) | INTRAMUSCULAR | Status: DC
  Administered 2014-03-02: 2 mg via INTRAVENOUS
  Filled 2014-02-28: qty 1

## 2014-02-28 MED ORDER — CLONIDINE HCL 0.2 MG PO TABS
0.2000 mg | ORAL_TABLET | Freq: Two times a day (BID) | ORAL | Status: DC
  Administered 2014-02-28 (×2): 0.2 mg via ORAL
  Filled 2014-02-28 (×4): qty 1

## 2014-02-28 MED ORDER — METOPROLOL TARTRATE 25 MG PO TABS
25.0000 mg | ORAL_TABLET | Freq: Two times a day (BID) | ORAL | Status: DC
  Administered 2014-02-28 (×2): 25 mg via ORAL
  Filled 2014-02-28 (×4): qty 1

## 2014-02-28 MED ORDER — GABAPENTIN 100 MG PO CAPS
200.0000 mg | ORAL_CAPSULE | Freq: Three times a day (TID) | ORAL | Status: DC
  Administered 2014-02-28 – 2014-03-03 (×9): 200 mg via ORAL
  Filled 2014-02-28 (×13): qty 2

## 2014-02-28 MED ORDER — LORAZEPAM 2 MG/ML IJ SOLN
1.0000 mg | Freq: Four times a day (QID) | INTRAMUSCULAR | Status: AC | PRN
Start: 2014-02-28 — End: 2014-03-03

## 2014-02-28 MED ORDER — SODIUM CHLORIDE 0.9 % IJ SOLN
3.0000 mL | Freq: Two times a day (BID) | INTRAMUSCULAR | Status: DC
  Administered 2014-02-28 – 2014-03-02 (×4): 3 mL via INTRAVENOUS

## 2014-02-28 MED ORDER — ISOSORBIDE MONONITRATE ER 60 MG PO TB24
60.0000 mg | ORAL_TABLET | Freq: Every day | ORAL | Status: DC | PRN
  Filled 2014-02-28: qty 1

## 2014-02-28 MED ORDER — THIAMINE HCL 100 MG/ML IJ SOLN
100.0000 mg | Freq: Every day | INTRAMUSCULAR | Status: DC
  Filled 2014-02-28 (×4): qty 1

## 2014-02-28 MED ORDER — CALCIUM CARBONATE ANTACID 500 MG PO CHEW
1.5000 | CHEWABLE_TABLET | Freq: Two times a day (BID) | ORAL | Status: DC | PRN
  Filled 2014-02-28: qty 1.5

## 2014-02-28 MED ORDER — TRAZODONE 25 MG HALF TABLET
25.0000 mg | ORAL_TABLET | Freq: Every evening | ORAL | Status: DC | PRN
Start: 2014-02-28 — End: 2014-02-28
  Filled 2014-02-28: qty 1

## 2014-02-28 MED ORDER — ADULT MULTIVITAMIN W/MINERALS CH
1.0000 | ORAL_TABLET | Freq: Every day | ORAL | Status: DC
  Filled 2014-02-28: qty 1

## 2014-02-28 MED ORDER — SODIUM CHLORIDE 0.9 % IJ SOLN
3.0000 mL | Freq: Two times a day (BID) | INTRAMUSCULAR | Status: DC
  Administered 2014-03-02: 22:00:00 via INTRAVENOUS
  Administered 2014-03-03: 3 mL via INTRAVENOUS

## 2014-02-28 MED ORDER — SODIUM CHLORIDE 0.9 % IV SOLN: 62.5000 mg | INTRAVENOUS | Status: DC

## 2014-02-28 MED ORDER — ROFLUMILAST 500 MCG PO TABS
500.0000 ug | ORAL_TABLET | Freq: Every day | ORAL | Status: DC
  Administered 2014-02-28 – 2014-03-03 (×4): 500 ug via ORAL
  Filled 2014-02-28 (×4): qty 1

## 2014-02-28 MED ORDER — CALCIUM ACETATE 667 MG PO CAPS
667.0000 mg | ORAL_CAPSULE | Freq: Every day | ORAL | Status: DC
  Administered 2014-02-28 – 2014-03-02 (×2): 667 mg via ORAL
  Filled 2014-02-28 (×4): qty 1

## 2014-02-28 MED ORDER — HYDROMORPHONE HCL 1 MG/ML IJ SOLN
INTRAMUSCULAR | Status: AC
  Administered 2014-02-28: 2 mg
  Filled 2014-02-28: qty 2

## 2014-02-28 MED ORDER — FAMOTIDINE 20 MG PO TABS
20.0000 mg | ORAL_TABLET | Freq: Two times a day (BID) | ORAL | Status: DC
  Administered 2014-02-28 – 2014-03-01 (×3): 20 mg via ORAL
  Filled 2014-02-28 (×4): qty 1

## 2014-02-28 MED ORDER — ESCITALOPRAM OXALATE 10 MG PO TABS
10.0000 mg | ORAL_TABLET | Freq: Every day | ORAL | Status: DC
  Administered 2014-02-28 – 2014-03-03 (×4): 10 mg via ORAL
  Filled 2014-02-28 (×4): qty 1

## 2014-02-28 NOTE — H&P (Addendum)
[facility] History and Physical  [name]:[id number] DOB: 11-Mar-1961 DOA: 02/28/2014  Referring physician: ED physician PCP: Wilmer Floor., MD  Specialists:   Chief Complaint: bilateral severe leg pain  HPI: [name] is a 53 y.o. male with past medical history of end-stage renal disease on dialysis, COPD, congestive heart failure, hyperlipidemia, OSA, history of alcohol abuse, C. difficile colitis, who presents with severe bilateral leg pain.  Patient is transferred from [facility]. Patient reports that he has restless leg syndrome for which he took gabapentin some times.  He also has chronic bilaterally leg pain, with unclear etiology. His leg pain became very severe since yesterday morning. He states that his pain is so bad that he feels sorb. He feels week in his legs due to pain and could not walk normally due to severe pain.   He has abdominal pain and diarrhea due to C. difficile colitis, which improved slightly since started metronidazole. He does not have fever or chills. He has mild cough and shortness of breath due to COPD, which are at his baseline. He had one episode of chest pain when he was on the way of being transferred from [facility] to [facility] hospital. His chest pain lasted for only a few seconds and then resolved completely. Now he is chest pain free.   He was to have leukocytosis of 22.4; negative chest x-ray;  CK 24; INR 1.2, trop 0.06. ABG showed pH 7.56, PCO2 28, PaO2 60. Patient is admitted to inpatient for further evaluation to treatment.   Review of Systems: As presented in the history of presenting illness, rest negative.  Where does patient live?  Lives alone at home Can patient participate in ADLs? Yes  Allergy: No Known Allergies  Past Medical History  Diagnosis Date  . Hypertension   . Systolic heart failure     EF 81-19%  . Hyperlipidemia   . COPD (chronic obstructive pulmonary disease)   . Peripheral vascular  disease   . Pneumonia 1980's  . OSA on CPAP   . ETOH abuse   . History of stomach ulcers   . Arthritis     "arms"  . Chronic lower back pain   . Anxiety   . Depression   . Adult ADHD     "never diagnosed; my son was; I think I've got it too"  . Chronic kidney disease (CKD), stage III (moderate)     baseline cr 2.3  . Renal cyst     left, complex  . Renal artery stenosis     a. s/p BMS to inferior branch of right renal artery 12/2011. On left, 3 renal arteries were noted, 2 of them were very small in size and subtotally occluded  . Anginal pain   . CHF (congestive heart failure)   . Seasonal allergies   . Hepatitis     "I think it was C"    Past Surgical History  Procedure Laterality Date  . Renal artery stent  01/05/2012    right inferior  . No past surgeries    . Av fistula placement Left 10/10/2012    Procedure: ARTERIOVENOUS (AV) FISTULA CREATION;  Surgeon: Sherren Kerns, MD;  Location: [facility] OR;  Service: Vascular;  Laterality: Left;  Brachio-cephalic    Social History:  reports that he has been smoking Cigarettes.  He has a 4 pack-year smoking history. His smokeless tobacco use includes Snuff and Chew. He reports that he drinks alcohol. He reports that  he does not use illicit drugs.  Family History:  Family History  Problem Relation Age of Onset  . Brain cancer Mother   . Pancreatic cancer Father   . Heart failure Paternal Uncle   . Heart disease Paternal Uncle   . Heart failure Paternal Grandfather   . Heart disease Paternal Grandfather      Prior to Admission medications   Medication Sig Start Date End Date Taking? Authorizing Provider  albuterol (PROVENTIL HFA;VENTOLIN HFA) 108 (90 BASE) MCG/ACT inhaler Inhale 2 puffs into the lungs every 6 (six) hours as needed for wheezing or shortness of breath.    Historical Provider, MD  ALPRAZolam Prudy Feeler(XANAX) 0.5 MG tablet Take 1 tablet (0.5 mg total) by mouth 3 (three) times daily as needed for anxiety. 02/18/14   Richarda OverlieNayana  Abrol, MD  amLODipine (NORVASC) 10 MG tablet Take 10 mg by mouth at bedtime.    Historical Provider, MD  aspirin EC 325 MG EC tablet Take 1 tablet (325 mg total) by mouth daily. 02/18/14   Richarda OverlieNayana Abrol, MD  budesonide-formoterol (SYMBICORT) 80-4.5 MCG/ACT inhaler Inhale 2 puffs into the lungs 2 (two) times daily. 01/17/14   Zannie CovePreetha Joseph, MD  calcium acetate (PHOSLO) 667 MG capsule Take 667 mg by mouth daily with supper.    Historical Provider, MD  calcium carbonate (TUMS EX) 750 MG chewable tablet Chew 1 tablet by mouth 2 (two) times daily as needed for heartburn.     Historical Provider, MD  cloNIDine (CATAPRES) 0.2 MG tablet Take 0.2 mg by mouth 2 (two) times daily.    Historical Provider, MD  diltiazem (CARDIZEM CD) 180 MG 24 hr capsule Take 1 capsule (180 mg total) by mouth daily. 02/18/14   Richarda OverlieNayana Abrol, MD  diphenhydrAMINE (BENADRYL) 25 MG tablet Take 25 mg by mouth every 6 (six) hours as needed for itching.     Historical Provider, MD  escitalopram (LEXAPRO) 10 MG tablet Take 1 tablet (10 mg total) by mouth daily. 02/18/14   Richarda OverlieNayana Abrol, MD  famotidine (PEPCID) 20 MG tablet Take 1 tablet (20 mg total) by mouth 2 (two) times daily. 02/18/14   Richarda OverlieNayana Abrol, MD  HYDROcodone-acetaminophen (NORCO/VICODIN) 5-325 MG per tablet Take 1 tablet by mouth 2 (two) times daily as needed for moderate pain. 02/18/14   Richarda OverlieNayana Abrol, MD  isosorbide mononitrate (IMDUR) 60 MG 24 hr tablet Take 60 mg by mouth daily as needed (blood pressure close to 200).    Historical Provider, MD  metoprolol tartrate (LOPRESSOR) 25 MG tablet Take 1 tablet (25 mg total) by mouth 2 (two) times daily. 02/18/14   Richarda OverlieNayana Abrol, MD  metroNIDAZOLE (FLAGYL) 500 MG tablet Take 1 tablet (500 mg total) by mouth 3 (three) times daily. 02/18/14   Richarda OverlieNayana Abrol, MD  Multiple Vitamin (MULTIVITAMIN WITH MINERALS) TABS tablet Take 1 tablet by mouth daily.    Historical Provider, MD  naltrexone (DEPADE) 50 MG tablet Take 0.5 tablets (25 mg total)  by mouth daily. 02/18/14   Richarda OverlieNayana Abrol, MD  nicotine (NICODERM CQ - DOSED IN MG/24 HR) 7 mg/24hr patch Place 1 patch (7 mg total) onto the skin daily. 02/18/14   Richarda OverlieNayana Abrol, MD  roflumilast (DALIRESP) 500 MCG TABS tablet Take 500 mcg by mouth daily.    Historical Provider, MD  thiamine 100 MG tablet Take 1 tablet (100 mg total) by mouth daily. 02/18/14   Richarda OverlieNayana Abrol, MD  tiotropium (SPIRIVA) 18 MCG inhalation capsule Place 18 mcg into inhaler and inhale daily.  Historical Provider, MD  traZODone (DESYREL) 25 mg TABS tablet Take 0.5 tablets (25 mg total) by mouth at bedtime as needed for sleep. 02/18/14   Richarda Overlie, MD  vitamin C (ASCORBIC ACID) 500 MG tablet Take 500 mg by mouth daily.    Historical Provider, MD    Physical Exam: Filed Vitals:   02/28/14 0030  BP: 125/61  Pulse: 92  Temp: 99.9 F (37.7 C)  TempSrc: Oral  Resp: 20  SpO2: 97%   General: in moderate acute distress HEENT:       Eyes: PERRL, EOMI, no scleral icterus       ENT: No discharge from the ears and nose, no pharynx injection, no tonsillar enlargement.        Neck: No JVD, no bruit, no mass felt. Cardiac: S1/S2, RRR, No murmurs, gallops or rubs Pulm: Good air movement bilaterally. Clear to auscultation bilaterally. No rales, wheezing, rhonchi or rubs. Abd: Soft, nondistended, nontender, no rebound pain, no organomegaly, BS present Ext: No edema. Strong pulses for DP/PTbilaterally. Very tender over his legs, from the top of feet to the knee level.  Musculoskeletal: No joint deformities Skin: No rashes.  Neuro: Alert and oriented X3, cranial nerves II-XII grossly intact, muscle strength 5/5 in all extremeties, sensation to light touch intact. Brachial reflex 2+ bilaterally. Knee reflex 1+ bilaterally.  Psych: Patient is not psychotic, no suicidal or hemocidal ideation.  Labs on Admission:  Basic Metabolic Panel: No results found for this basename: NA, K, CL, CO2, GLUCOSE, BUN, CREATININE, CALCIUM, MG,  PHOS,  in the last 168 hours Liver Function Tests: No results found for this basename: AST, ALT, ALKPHOS, BILITOT, PROT, ALBUMIN,  in the last 168 hours No results found for this basename: LIPASE, AMYLASE,  in the last 168 hours No results found for this basename: AMMONIA,  in the last 168 hours CBC: No results found for this basename: WBC, NEUTROABS, HGB, HCT, MCV, PLT,  in the last 168 hours Cardiac Enzymes: No results found for this basename: CKTOTAL, CKMB, CKMBINDEX, TROPONINI,  in the last 168 hours  BNP (last 3 results)  Recent Labs  02/09/14 1425  PROBNP >70000.0*   CBG: No results found for this basename: GLUCAP,  in the last 168 hours  Radiological Exams on Admission: No results found.  EKG: Independently reviewed. Tachycardia and  QTC prolongation  Assessment/Plan Principal Problem:   Leg pain, bilateral Active Problems:   CKD (chronic kidney disease) stage V requiring chronic dialysis   Hyperlipidemia   COPD (chronic obstructive pulmonary disease)   ETOH abuse   Chronic systolic heart failure   OSA on CPAP   HTN (hypertension)   Tachycardia   Leukocytosis  1. Bilateral severe leg pain: Etiology is not clear. Differential diagnoses include, peripheral neuropathy; peripheral vascular disease and Buerger's disease given his smoking hx (it is less likely, given that he has strong pulses for DP/PT bilaterally, No signs of ischemia or gangrene change); arthralgia secondary to the side effects of naltrexone; inflammatory myopathy (less likely, CK normal). - will admit to tele bed given tachycardia - will hold naltrexone, start CIWA protocol - pain control: MS contin, morphine PRN, Gabapentin  2. COPD: stable. Cough and shortness of breath at his baseline. Loss patient is clear bilaterally. -continue home medications and inhalers.  3. CHF: 2-D echo on 02/10/14 showed EF 60-65% with grade 1 diastolic dysfunction. Currently patient is euvolumic clinically. - continue  ASA and Metoprolol - continue blood pressure control: Amlodipine, clonidine, Cardizem,  4. HTN: continue home meds: Amlodipine, clonidine, Cardizem, metoprolol  5. history of alcohol abuse: Patient reports that he completed detoxification recently. Currently he is taking Naltrexone at home. He feels fine on admission, no signs of withdrawal. - will start CIWA protocol since naltrexone is on hold  6. Tachycardia: It is likely secondary to pain. Currently patient does not have any chest pain. - Trop x 3 - repeat EKG in AM  7. Prolonged QTs: 542 - will hold Trazodone  - avoid zofran  8.  leukocytosis: [facility] with WBC trending 22.4, repeated CBC showed WBC 18.3. Etiology is not clear. There is no signs of infection identified. Patient does not have fever or chills chest x-ray is negative for infiltration. -will check UA - trend WBC  9. ESRD-HD (T/T/S): Cre 3.4 and BUN 14. left message to renal for dialysis   DVT ppx: SQ Heparin   Code Status: Full code Family Communication: None at bed side.  Disposition Plan: Admit to inpatient   Date of Service 02/28/2014    Lorretta HarpIU, Cire Deyarmin [facility] Pager [phone]  If 7PM-7AM, please contact night-coverage [email or web address] Password [email or web address] 02/28/2014, 2:57 AM

## 2014-02-28 NOTE — Consult Note (Signed)
[facility] Renal Consultation Note    Indication for Consultation:  Management of ESRD/hemodialysis; anemia, hypertension/volume and secondary hyperparathyroidism  HPI: [name] is a 53 y.o. 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 He normally dialyzes TTS in [place].  He missed Tuesday and came yesterday afternoon and dialyzed nearly his full time.  EDW lowered 2.5 kg last admission - Net UF 10/21 was 4.4 with post wt only 0.5 above EDW BP was 107/60.    He was admitted earlier this am after having been transferred from [facility] with severe bilateral leg pain.  He missed Tuesday HD secondary to diarrhea and leg pain and went on Wed.  Pain in his legs was so bad at the end of HD he went to ED at [facility]. He has chronic RLS and leg pain and takes neurontin for this. He still has some abdominal pain/diarrhea and has been on metronidazole. At [facility] he had a neg CXR, but WBC 22K trop 0.06 pH 7.45 PCO2 28 and PO2 60.  Since his last d/c he has not smoked or had alcohol. He denies CP or SOB, new cough, fever or chills. He continues to have "bad" diarrhea and abdominal pain along with severe leg pain which he describes as shooting in nature. Sometimes not eating well due to abdominal pain. Had to use crutches to walk due to pain so severe he had a hard time bearing weight on them.  Past Medical History  Diagnosis Date  . Hypertension   . Systolic heart failure     EF 60-45%  . Hyperlipidemia   . COPD (chronic obstructive pulmonary disease)   . Peripheral vascular disease   . Pneumonia 1980's  . OSA on CPAP   . ETOH abuse   . History of  stomach ulcers   . Arthritis     "arms"  . Chronic lower back pain   . Anxiety   . Depression   . Adult ADHD     "never diagnosed; my son was; I think I've got it too"  . Chronic kidney disease (CKD), stage III (moderate)     baseline cr 2.3  . Renal cyst     left, complex  . Renal artery stenosis     a. s/p BMS to inferior branch of right renal artery 12/2011. On left, 3 renal arteries were noted, 2 of them were very small in size and subtotally occluded  . Anginal pain   . CHF (congestive heart failure)   . Seasonal allergies   . Hepatitis     "I think it was C"   Past Surgical History  Procedure Laterality Date  . Renal artery stent  01/05/2012    right inferior  . Av fistula placement Left 10/10/2012    Procedure: ARTERIOVENOUS (AV) FISTULA CREATION;  Surgeon: Sherren Kerns, MD;  [facility]: [facility] OR;  Service: Vascular;  Laterality: Left;  Brachio-cephalic   Family History  Problem Relation Age of Onset  . Brain cancer Mother   . Pancreatic cancer Father   . Heart failure Paternal Uncle   . Heart disease Paternal Uncle   . Heart failure Paternal Grandfather   .  Heart disease Paternal Grandfather    Social History:Lives alone - has dog - hasn't smoked or had etoh since last admission  reports that he has been smoking Cigarettes.  He has a 4 pack-year smoking history. His smokeless tobacco use includes Snuff and Chew. He reports that he drinks alcohol. He reports that he does not use illicit drugs. No Known Allergies Prior to Admission medications   Medication Sig Start Date End Date Taking? Authorizing Provider  acetaminophen (TYLENOL) 500 MG tablet Take 1,000 mg by mouth every 6 (six) hours as needed for mild pain or moderate pain.   Yes Historical Provider, MD  albuterol (PROVENTIL HFA;VENTOLIN HFA) 108 (90 BASE) MCG/ACT inhaler Inhale 2 puffs into the lungs every 6 (six) hours as needed for wheezing or shortness of breath.   Yes Historical Provider, MD  ALPRAZolam Prudy Feeler)  0.5 MG tablet Take 1 tablet (0.5 mg total) by mouth 3 (three) times daily as needed for anxiety. 02/18/14  Yes Richarda Overlie, MD  amLODipine (NORVASC) 10 MG tablet Take 10 mg by mouth at bedtime.   Yes Historical Provider, MD  aspirin EC 325 MG EC tablet Take 1 tablet (325 mg total) by mouth daily. 02/18/14  Yes Richarda Overlie, MD  budesonide-formoterol (SYMBICORT) 80-4.5 MCG/ACT inhaler Inhale 2 puffs into the lungs 2 (two) times daily. 01/17/14  Yes Zannie Cove, MD  calcium acetate (PHOSLO) 667 MG capsule Take 667 mg by mouth daily with supper.   Yes Historical Provider, MD  calcium carbonate (TUMS EX) 750 MG chewable tablet Chew 1 tablet by mouth 2 (two) times daily as needed for heartburn.    Yes Historical Provider, MD  cloNIDine (CATAPRES) 0.2 MG tablet Take 0.2 mg by mouth 2 (two) times daily.   Yes Historical Provider, MD  diltiazem (CARDIZEM CD) 180 MG 24 hr capsule Take 1 capsule (180 mg total) by mouth daily. 02/18/14  Yes Richarda Overlie, MD  diphenhydrAMINE (BENADRYL) 25 MG tablet Take 25 mg by mouth every 6 (six) hours as needed for itching.    Yes Historical Provider, MD  escitalopram (LEXAPRO) 10 MG tablet Take 1 tablet (10 mg total) by mouth daily. 02/18/14  Yes Richarda Overlie, MD  famotidine (PEPCID) 20 MG tablet Take 1 tablet (20 mg total) by mouth 2 (two) times daily. 02/18/14  Yes Richarda Overlie, MD  HYDROcodone-acetaminophen (NORCO/VICODIN) 5-325 MG per tablet Take 1 tablet by mouth 2 (two) times daily as needed for moderate pain. 02/18/14  Yes Richarda Overlie, MD  isosorbide mononitrate (IMDUR) 60 MG 24 hr tablet Take 60 mg by mouth every morning.   Yes Historical Provider, MD  metoprolol tartrate (LOPRESSOR) 25 MG tablet Take 1 tablet (25 mg total) by mouth 2 (two) times daily. 02/18/14  Yes Richarda Overlie, MD  metroNIDAZOLE (FLAGYL) 500 MG tablet Take 1 tablet (500 mg total) by mouth 3 (three) times daily. 02/18/14  Yes Richarda Overlie, MD  Multiple Vitamin (MULTIVITAMIN WITH MINERALS) TABS  tablet Take 1 tablet by mouth daily.   Yes Historical Provider, MD  naltrexone (DEPADE) 50 MG tablet Take 0.5 tablets (25 mg total) by mouth daily. 02/18/14  Yes Richarda Overlie, MD  nicotine (NICODERM CQ - DOSED IN MG/24 HR) 7 mg/24hr patch Place 1 patch (7 mg total) onto the skin daily. 02/18/14  Yes Richarda Overlie, MD  roflumilast (DALIRESP) 500 MCG TABS tablet Take 500 mcg by mouth daily.   Yes Historical Provider, MD  thiamine 100 MG tablet Take 1 tablet (100 mg total) by mouth  daily. 02/18/14  Yes Richarda Overlie, MD  tiotropium (SPIRIVA) 18 MCG inhalation capsule Place 18 mcg into inhaler and inhale daily.   Yes Historical Provider, MD  traZODone (DESYREL) 25 mg TABS tablet Take 0.5 tablets (25 mg total) by mouth at bedtime as needed for sleep. 02/18/14  Yes Richarda Overlie, MD  vitamin C (ASCORBIC ACID) 500 MG tablet Take 500 mg by mouth daily.   Yes Historical Provider, MD   Current Facility-Administered Medications  Medication Dose Route Frequency Provider Last Rate Last Dose  . 0.9 %  sodium chloride infusion  250 mL Intravenous PRN Lorretta Harp, MD      . albuterol (PROVENTIL) (2.5 MG/3ML) 0.083% nebulizer solution 2.5 mg  2.5 mg Inhalation Q6H PRN Lorretta Harp, MD      . amLODipine (NORVASC) tablet 10 mg  10 mg Oral QHS Lorretta Harp, MD      . aspirin EC tablet 325 mg  325 mg Oral Daily Lorretta Harp, MD      . budesonide-formoterol (SYMBICORT) 80-4.5 MCG/ACT inhaler 2 puff  2 puff Inhalation BID Lorretta Harp, MD   2 puff at 02/28/14 [phone]  . calcium acetate (PHOSLO) capsule 667 mg  667 mg Oral Q supper Lorretta Harp, MD      . calcium carbonate (TUMS - dosed in mg elemental calcium) chewable tablet 300 mg of elemental calcium  1.5 tablet Oral BID PRN Lorretta Harp, MD      . cloNIDine (CATAPRES) tablet 0.2 mg  0.2 mg Oral BID Lorretta Harp, MD      . diltiazem (CARDIZEM CD) 24 hr capsule 180 mg  180 mg Oral Daily Lorretta Harp, MD      . escitalopram (LEXAPRO) tablet 10 mg  10 mg Oral Daily Lorretta Harp, MD      . famotidine  (PEPCID) tablet 20 mg  20 mg Oral BID Lorretta Harp, MD      . folic acid (FOLVITE) tablet 1 mg  1 mg Oral Daily Lorretta Harp, MD      . gabapentin (NEURONTIN) capsule 200 mg  200 mg Oral TID Lorretta Harp, MD      . heparin injection 5,000 Units  5,000 Units Subcutaneous 3 times per day Lorretta Harp, MD      . HYDROmorphone (DILAUDID) injection 1 mg  1 mg Intravenous Q3H PRN Lorretta Harp, MD   1 mg at 02/28/14 9604  . isosorbide mononitrate (IMDUR) 24 hr tablet 60 mg  60 mg Oral Daily PRN Lorretta Harp, MD      . LORazepam (ATIVAN) injection 0-4 mg  0-4 mg Intravenous 4 times per day Lorretta Harp, MD       Followed by  . [START ON 03/02/2014] LORazepam (ATIVAN) injection 0-4 mg  0-4 mg Intravenous Q12H Lorretta Harp, MD      . LORazepam (ATIVAN) tablet 1 mg  1 mg Oral Q6H PRN Lorretta Harp, MD       Or  . LORazepam (ATIVAN) injection 1 mg  1 mg Intravenous Q6H PRN Lorretta Harp, MD      . metoprolol tartrate (LOPRESSOR) tablet 25 mg  25 mg Oral BID Lorretta Harp, MD      . metroNIDAZOLE (FLAGYL) tablet 500 mg  500 mg Oral 3 times per day Lorretta Harp, MD   500 mg at 02/28/14 0534  . morphine (MS CONTIN) 12 hr tablet 15 mg  15 mg Oral Q12H Lorretta Harp, MD   15 mg at 02/28/14 0502  . multivitamin with minerals  tablet 1 tablet  1 tablet Oral Daily Lorretta HarpXilin Niu, MD      . nicotine (NICODERM CQ - dosed in mg/24 hr) patch 7 mg  7 mg Transdermal Daily Lorretta HarpXilin Niu, MD   7 mg at 02/28/14 0531  . roflumilast (DALIRESP) tablet 500 mcg  500 mcg Oral Daily Lorretta HarpXilin Niu, MD      . sodium chloride 0.9 % injection 3 mL  3 mL Intravenous Q12H Lorretta HarpXilin Niu, MD      . sodium chloride 0.9 % injection 3 mL  3 mL Intravenous Q12H Lorretta HarpXilin Niu, MD      . sodium chloride 0.9 % injection 3 mL  3 mL Intravenous PRN Lorretta HarpXilin Niu, MD      . thiamine (VITAMIN B-1) tablet 100 mg  100 mg Oral Daily Lorretta HarpXilin Niu, MD       Or  . thiamine (B-1) injection 100 mg  100 mg Intravenous Daily Lorretta HarpXilin Niu, MD      . tiotropium [facility](SPIRIVA) inhalation capsule 18 mcg  18 mcg Inhalation Daily Lorretta HarpXilin Niu,  MD   18 mcg at 02/28/14 16100906  . vitamin C (ASCORBIC ACID) tablet 500 mg  500 mg Oral Daily Lorretta HarpXilin Niu, MD       Labs: Basic Metabolic Panel:  Recent Labs Lab 02/28/14 0314  NA 134*  K 3.7  CL 92*  CO2 22  GLUCOSE 106*  BUN 17  CREATININE 4.07*  CALCIUM 8.2*  PHOS 3.3  CBC:  Recent Labs Lab 02/28/14 0314  WBC 18.3*  NEUTROABS 15.6*  HGB 9.4*  HCT 27.4*  MCV 98.2  PLT 362   Cardiac Enzymes:  Recent Labs Lab 02/28/14 0316 02/28/14 0950  TROPONINI <0.30 <0.30   ROS: As per HPI otherwise negative.  Physical Exam: Filed Vitals:   02/28/14 0030 02/28/14 0137 02/28/14 0544 02/28/14 0906  BP: 125/61  117/73   Pulse: 92  103   Temp: 99.9 F (37.7 C)  98.8 F (37.1 C)   TempSrc: Oral  Oral   Resp: 20  21   Height:  5\' 7"  (1.702 m)    Weight:   64.2 kg (141 lb 8.6 oz)   SpO2: 97%  96% 98%     General: pale poorly kempt WM in no acute distress on room air Head: Normocephalic, atraumatic, sclera non-icteric, mucus membranes are moist, dentition very poor Neck: Supple. JVD not elevated. Lungs: Clear bilaterally to auscultation without wheezes, rales, or rhonchi. Breathing is unlabored. Heart: RRR w/ occ ectopy Abdomen:  protuberant + BS mildly tender throughout M-S:  Muscle wasting for age Lower extremities :without edema or ischemic changes, no open wounds, prominent veins + DP pulse Neuro: Alert and oriented X 3. Moves all extremities spontaneously. Psych:  Responds to questions appropriately with a appropriate affect. Dialysis Access: left upper AVF patent  Dialysis Orders: Center: [facility] 4 hr 180 400/A 1.5 3 K 2.25 Ca left upper AVF Aranesp 80 - last given 10/21 Heparin 3000 Venofer 50 start 10/28 - got 100 10/21 Recent labs:  Hgb 9.7 10/15 15% sat 9/24 s/p course of venofer iPTH 87 7/23 off vit D Ca in the 8s P controlled  Assessment/Plan: 1. Bilateral leg pain - work up per primary - off naltrexone; pain control; likely neuropathic, though high risk for  PVD of LE - B12 pending 2. COPD/tobacco abuse - CXR neg at [place]/ nicotine patch - gave + reinforcement for no smoking x 3 weeks! 3. ESRD -  TTS - off schedule -  Had yesterday - no acute indication for today - plan tomorrow and then if not d/c again Sat to get back on schedule K 3.7 - on 3 K bath as an outpt 4. Hypertension/volume  - ok; on BB, norvasc, CCB and clonidine- consider tapering  off of clonidine 5. Anemia  - Hgb 9.4 - got Aranesp yesterday - redose next week if still here and continue weekly Fe 6. Metabolic bone disease -  Off vit D, continue phoslo 7. Nutrition - renal diet + supplement/ on a variety of vits 8. Hx etoh abuse - no drinking since last admission + reinforcement given 9. Leukocytosis/fever tmax 99.9 - CXR neg - recent Cdiff - has been taking metronidazole - reordered CDiff PCR 10. Afib with RVR on admission ekg - on BB, CCB - wouldn't be a good coumadin candidate 11. Depression - hx SI - on lexapro; spirits pretty good 12. ASCVD stress test last adm neg 13. OSA - CPAP causes claustrophobia  Sheffield SliderMartha B Bergman, PA-C [facility] Beeper [phone] 02/28/2014, 11:26 AM   Pt seen, examined and agree w A/P as above.  Vinson Moselleob Fran Neiswonger MD pager [phone]    cell [phone] 02/28/2014, 4:32 PM

## 2014-02-28 NOTE — Progress Notes (Signed)
Patient admitted after midnight by Dr. Clyde LundborgNiu- please see H&P. Patient is not suicidal d/c precautions.   Check B12 neuronintin per admitting doc Pulses intact extremities warm  Marlin CanaryJessica Vann

## 2014-02-28 NOTE — Progress Notes (Signed)
Utilization review completed. Adilen Pavelko, RN, BSN. 

## 2014-03-01 DIAGNOSIS — M722 Plantar fascial fibromatosis: Principal | ICD-10-CM

## 2014-03-01 DIAGNOSIS — N186 End stage renal disease: Secondary | ICD-10-CM

## 2014-03-01 DIAGNOSIS — A047 Enterocolitis due to Clostridium difficile: Secondary | ICD-10-CM

## 2014-03-01 DIAGNOSIS — F1010 Alcohol abuse, uncomplicated: Secondary | ICD-10-CM

## 2014-03-01 LAB — URINALYSIS, ROUTINE W REFLEX MICROSCOPIC
GLUCOSE, UA: NEGATIVE mg/dL
Hgb urine dipstick: NEGATIVE
KETONES UR: 15 mg/dL — AB
Nitrite: POSITIVE — AB
PH: 5 (ref 5.0–8.0)
Protein, ur: 100 mg/dL — AB
Specific Gravity, Urine: 1.02 (ref 1.005–1.030)
Urobilinogen, UA: 0.2 mg/dL (ref 0.0–1.0)

## 2014-03-01 LAB — URINE MICROSCOPIC-ADD ON

## 2014-03-01 LAB — CLOSTRIDIUM DIFFICILE BY PCR: Toxigenic C. Difficile by PCR: POSITIVE — AB

## 2014-03-01 LAB — CK: Total CK: 22 U/L (ref 7–232)

## 2014-03-01 MED ORDER — ALBUTEROL SULFATE (2.5 MG/3ML) 0.083% IN NEBU
2.5000 mg | INHALATION_SOLUTION | Freq: Two times a day (BID) | RESPIRATORY_TRACT | Status: DC
  Administered 2014-03-01 – 2014-03-03 (×4): 2.5 mg via RESPIRATORY_TRACT
  Filled 2014-03-01 (×5): qty 3

## 2014-03-01 MED ORDER — FAMOTIDINE 10 MG PO TABS
10.0000 mg | ORAL_TABLET | Freq: Two times a day (BID) | ORAL | Status: DC
  Administered 2014-03-01 – 2014-03-03 (×4): 10 mg via ORAL
  Filled 2014-03-01 (×5): qty 1

## 2014-03-01 MED ORDER — METOPROLOL TARTRATE 25 MG PO TABS
25.0000 mg | ORAL_TABLET | Freq: Two times a day (BID) | ORAL | Status: DC
  Administered 2014-03-01 – 2014-03-03 (×4): 25 mg via ORAL
  Filled 2014-03-01 (×5): qty 1

## 2014-03-01 NOTE — Progress Notes (Signed)
Pt. Refuses CPAP at this time. Pt. States that he rarely uses CPAP at home. Pt. Was made aware to inform RN or RT anytime during the night if he changed his mind & decided to wear CPAP.

## 2014-03-01 NOTE — Progress Notes (Signed)
Orthopedic Tech Progress Note Patient Details:  [name] 05/28/1960 [id number]  Ortho Devices Type of Ortho Device:  (prafo boot) Ortho Device/Splint Location: bilateral Ortho Device/Splint Interventions: Freeman CaldronOrdered   [name] 03/01/2014, 12:19 PM

## 2014-03-01 NOTE — Progress Notes (Addendum)
PROGRESS NOTE  [name] WUJ:[id number] DOB: 10/21/1960 DOA: 02/28/2014 PCP: Wilmer FloorAMPBELL, STEPHEN D., MD  Assessment/Plan: Bilateral severe leg pain:  Appears to be plantar fascitis Hurts when pressure applied and when walking in AM - pain control: MS contin, morphine PRN, Gabapentin  -will get ABIs as well but pulses and skin are intact  COPD: stable. Cough and shortness of breath at his baseline. Loss patient is clear bilaterally.  -continue home medications and inhalers.   CHF: 2-D echo on 02/10/14 showed EF 60-65% with grade 1 diastolic dysfunction. Currently patient is euvolumic clinically.  - continue ASA and Metoprolol  - continue blood pressure control: Amlodipine, clonidine, Cardizem,    HTN: continue home meds: Amlodipine, clonidine, Cardizem, metoprolol    history of alcohol abuse: Patient reports that he completed detoxification recently. Currently he is taking Naltrexone at home. He feels fine on admission, no signs of withdrawal.  - will start CIWA protocol since naltrexone is on hold   Prolonged QTs: 542  - will hold Trazodone  - avoid zofran   leukocytosis:  Still with c-diff positive results  ESRD-HD: Cre 3.4 and BUN 14.  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   Code Status: full Family Communication: patient Disposition Plan:   Consultants:  Renal  PT  Procedures:      HPI/Subjective: Still with pain when getting up on feet- pain shoots from bottom of foot to knee  Objective: Filed Vitals:   03/01/14 1040  BP: 99/68  Pulse: 89  Temp:   Resp:     Intake/Output Summary (Last 24 hours) at 03/01/14 1209 Last data filed at 03/01/14 0700  Gross per 24 hour  Intake    720 ml  Output      0 ml  Net    720 ml   Filed Weights   02/28/14 0544 03/01/14 0431  Weight: 64.2 kg (141 lb 8.6 oz) 64.8 kg (142 lb 13.7 oz)    Exam:   General:  A+Ox3, NAd  Cardiovascular: rrr  Respiratory: clear  Abdomen: +BS,  soft  Musculoskeletal: pain with palpation of foot   Data Reviewed: Basic Metabolic Panel:  Recent Labs Lab 02/28/14 0314  NA 134*  K 3.7  CL 92*  CO2 22  GLUCOSE 106*  BUN 17  CREATININE 4.07*  CALCIUM 8.2*  MG 2.0  PHOS 3.3   Liver Function Tests: No results found for this basename: AST, ALT, ALKPHOS, BILITOT, PROT, ALBUMIN,  in the last 168 hours No results found for this basename: LIPASE, AMYLASE,  in the last 168 hours No results found for this basename: AMMONIA,  in the last 168 hours CBC:  Recent Labs Lab 02/28/14 0314  WBC 18.3*  NEUTROABS 15.6*  HGB 9.4*  HCT 27.4*  MCV 98.2  PLT 362   Cardiac Enzymes:  Recent Labs Lab 02/28/14 0316 02/28/14 0950 02/28/14 1445  TROPONINI <0.30 <0.30 <0.30   BNP (last 3 results)  Recent Labs  02/09/14 1425  PROBNP >70000.0*   CBG: No results found for this basename: GLUCAP,  in the last 168 hours  Recent Results (from the past 240 hour(s))  CLOSTRIDIUM DIFFICILE BY PCR     Status: Abnormal   Collection Time    02/28/14  6:29 PM      Result Value Ref Range Status   C difficile by pcr POSITIVE (*) NEGATIVE Final   Comment: CRITICAL RESULT CALLED TO, READ BACK BY AND VERIFIED WITH:     LEWIS RN  9:50 03/01/14 ([email or web address])     Studies: No results found.  Scheduled Meds: . albuterol  2.5 mg Nebulization BID  . aspirin EC  325 mg Oral Daily  . budesonide-formoterol  2 puff Inhalation BID  . calcium acetate  667 mg Oral Q supper  . [START ON 03/05/2014] darbepoetin (ARANESP) injection - DIALYSIS  80 mcg Intravenous Q Tue-HD  . diltiazem  180 mg Oral Daily  . escitalopram  10 mg Oral Daily  . famotidine  10 mg Oral BID  . [START ON 03/05/2014] ferric gluconate (FERRLECIT/NULECIT) IV  62.5 mg Intravenous Q Tue-HD  . folic acid  1 mg Oral Daily  . gabapentin  200 mg Oral TID  . heparin  5,000 Units Subcutaneous 3 times per day  . LORazepam  0-4 mg Intravenous 4 times per day   Followed by  . [START ON  03/02/2014] LORazepam  0-4 mg Intravenous Q12H  . metoprolol tartrate  25 mg Oral BID  . metroNIDAZOLE  500 mg Oral 3 times per day  . morphine  15 mg Oral Q12H  . multivitamin with minerals  1 tablet Oral Daily  . nicotine  7 mg Transdermal Daily  . roflumilast  500 mcg Oral Daily  . sodium chloride  3 mL Intravenous Q12H  . sodium chloride  3 mL Intravenous Q12H  . thiamine  100 mg Oral Daily   Or  . thiamine  100 mg Intravenous Daily  . tiotropium  18 mcg Inhalation Daily  . vitamin C  500 mg Oral Daily   Continuous Infusions:  Antibiotics Given (last 72 hours)   Date/Time Action Medication Dose   02/28/14 0534 Given   metroNIDAZOLE (FLAGYL) tablet 500 mg 500 mg   02/28/14 1128 Given   metroNIDAZOLE (FLAGYL) tablet 500 mg 500 mg   02/28/14 2144 Given   metroNIDAZOLE (FLAGYL) tablet 500 mg 500 mg   03/01/14 [id number] Given   metroNIDAZOLE (FLAGYL) tablet 500 mg 500 mg      Principal Problem:   Leg pain, bilateral Active Problems:   CKD (chronic kidney disease) stage V requiring chronic dialysis   Hyperlipidemia   COPD (chronic obstructive pulmonary disease)   ETOH abuse   Chronic systolic heart failure   OSA on CPAP   HTN (hypertension)   Tachycardia   Leukocytosis    Time spent: 35 min    [name]  [facility] Pager [phone] If 7PM-7AM, please contact night-coverage at [email or web address], password [email or web address] 03/01/2014, 12:09 PM  LOS: 1 day

## 2014-03-01 NOTE — Clinical Documentation Improvement (Signed)
Please clarify the type of anemia   Blood loss anemia, chronic   Chronic anemia   Deficiency anemia (please specify type)    Due to/in/with chronic kidney disease   Due to/in/with kidney failure    Iron deficiency   Other anemia (please specify)____________________    Unable to determine   Supporting Information/Risk factors: Anemia - Hgb 9.4 - got Aranesp yesterday - redose next week if still here and continue weekly Fe ESRD>CKD (chronic kidney disease) stage V requiring chronic dialysis ETOH abuse   Thank You, Elpidio AnisGarnet Yolonda Purtle RN, CDI [facility] DEPT. [phone]

## 2014-03-01 NOTE — Progress Notes (Signed)
[facility] Progress Note   Subjective: still having bilat LE pain, somewhat chronic  Filed Vitals:   02/28/14 2144 03/01/14 0431 03/01/14 0854 03/01/14 1040  BP: 93/61 96/63  99/68  Pulse: 89 86  89  Temp: 98.8 F (37.1 C) 97.9 F (36.6 C)    TempSrc: Oral Oral    Resp: 20 18    Height:      Weight:  64.8 kg (142 lb 13.7 oz)    SpO2: 96% 97% 95% 95%   Exam: Alert, WM no distress, slightly tremulous No jvd Chest clear bilat RRR distant HS, only one heart sound noted, no MRG Abd distended, ?ascites, nontender LE's tender musculature throughout upper and lower legs, feet are pale, strong pedal pulses bilat Neuro has good sensation bilat LE's including feet, muscle strength in LE's is very poor  HD: TTS [facility]  4h  64kg 3K/2.25 Bath  LUA AVF   Heparin 3000 Aranesp 80 q Wed, Venofer 50 mg q Wed tsat 15% s/p Fe load course, pth 87, P ok      Assessment: 1 Bilat leg pain and weakness - good pulses and nerve function in the legs, but very weak, ?myopathy 2 ESRD on HD 3 ETOH abuse, on CIWA protocol 4 HTN/vol on norvasc, clonidine, dilt , metoprolol with low BP 90's, at dry weight 5 Anemia cont Fe, aranesp 6 Cdif infection - from last admit, on abx 7 Afib - on MTP, dilt, not good coumadin candidate 8 HPTH off vit D, cont phoslo  Plan- check CPK, aldolase; stop norvasc and clonidine, hold dilt and MTP if hypotensive    Vinson Moselleob Twylia Oka MD  pager [phone]    cell [phone]  03/01/2014, 10:42 AM     Recent Labs Lab 02/28/14 0314  NA 134*  K 3.7  CL 92*  CO2 22  GLUCOSE 106*  BUN 17  CREATININE 4.07*  CALCIUM 8.2*  PHOS 3.3   No results found for this basename: AST, ALT, ALKPHOS, BILITOT, PROT, ALBUMIN,  in the last 168 hours  Recent Labs Lab 02/28/14 0314  WBC 18.3*  NEUTROABS 15.6*  HGB 9.4*  HCT 27.4*  MCV 98.2  PLT 362   . albuterol  2.5 mg Nebulization BID  . amLODipine  10 mg Oral QHS  . aspirin EC  325 mg Oral Daily  .  budesonide-formoterol  2 puff Inhalation BID  . calcium acetate  667 mg Oral Q supper  . cloNIDine  0.2 mg Oral BID  . [START ON 03/05/2014] darbepoetin (ARANESP) injection - DIALYSIS  80 mcg Intravenous Q Tue-HD  . diltiazem  180 mg Oral Daily  . escitalopram  10 mg Oral Daily  . famotidine  20 mg Oral BID  . [START ON 03/05/2014] ferric gluconate (FERRLECIT/NULECIT) IV  62.5 mg Intravenous Q Tue-HD  . folic acid  1 mg Oral Daily  . gabapentin  200 mg Oral TID  . heparin  5,000 Units Subcutaneous 3 times per day  . LORazepam  0-4 mg Intravenous 4 times per day   Followed by  . [START ON 03/02/2014] LORazepam  0-4 mg Intravenous Q12H  . metoprolol tartrate  25 mg Oral BID  . metroNIDAZOLE  500 mg Oral 3 times per day  . morphine  15 mg Oral Q12H  . multivitamin with minerals  1 tablet Oral Daily  . nicotine  7 mg Transdermal Daily  . roflumilast  500 mcg Oral Daily  . sodium chloride  3 mL Intravenous Q12H  .  sodium chloride  3 mL Intravenous Q12H  . thiamine  100 mg Oral Daily   Or  . thiamine  100 mg Intravenous Daily  . tiotropium  18 mcg Inhalation Daily  . vitamin C  500 mg Oral Daily     sodium chloride, albuterol, calcium carbonate, HYDROmorphone (DILAUDID) injection, isosorbide mononitrate, LORazepam, LORazepam, sodium chloride

## 2014-03-02 LAB — BASIC METABOLIC PANEL
Anion gap: 13 (ref 5–15)
BUN: 13 mg/dL (ref 6–23)
CALCIUM: 8.1 mg/dL — AB (ref 8.4–10.5)
CHLORIDE: 98 meq/L (ref 96–112)
CO2: 27 meq/L (ref 19–32)
Creatinine, Ser: 3.18 mg/dL — ABNORMAL HIGH (ref 0.50–1.35)
GFR calc Af Amer: 24 mL/min — ABNORMAL LOW (ref >90)
GFR, EST NON AFRICAN AMERICAN: 21 mL/min — AB (ref >90)
Glucose, Bld: 110 mg/dL — ABNORMAL HIGH (ref 70–99)
POTASSIUM: 4.1 meq/L (ref 3.7–5.3)
SODIUM: 138 meq/L (ref 137–147)

## 2014-03-02 LAB — CBC
HCT: 25.4 % — ABNORMAL LOW (ref 39.0–52.0)
Hemoglobin: 8.2 g/dL — ABNORMAL LOW (ref 13.0–17.0)
MCH: 32.5 pg (ref 26.0–34.0)
MCHC: 32.3 g/dL (ref 30.0–36.0)
MCV: 100.8 fL — AB (ref 78.0–100.0)
PLATELETS: 467 10*3/uL — AB (ref 150–400)
RBC: 2.52 MIL/uL — ABNORMAL LOW (ref 4.22–5.81)
RDW: 15.9 % — AB (ref 11.5–15.5)
WBC: 9.5 10*3/uL (ref 4.0–10.5)

## 2014-03-02 MED ORDER — ALTEPLASE 2 MG IJ SOLR: 2.0000 mg | Freq: Once | INTRAMUSCULAR | Status: DC | PRN

## 2014-03-02 MED ORDER — NEPRO/CARBSTEADY PO LIQD: 237.0000 mL | ORAL | Status: DC | PRN

## 2014-03-02 MED ORDER — HEPARIN SODIUM (PORCINE) 1000 UNIT/ML DIALYSIS: 3000.0000 [IU] | Freq: Once | INTRAMUSCULAR | Status: DC

## 2014-03-02 MED ORDER — LIDOCAINE HCL (PF) 1 % IJ SOLN: 5.0000 mL | INTRAMUSCULAR | Status: DC | PRN

## 2014-03-02 MED ORDER — LIDOCAINE-PRILOCAINE 2.5-2.5 % EX CREA
1.0000 | TOPICAL_CREAM | CUTANEOUS | Status: DC | PRN
Start: 2014-03-02 — End: 2014-03-02

## 2014-03-02 MED ORDER — SODIUM CHLORIDE 0.9 % IV SOLN: 100.0000 mL | INTRAVENOUS | Status: DC | PRN

## 2014-03-02 MED ORDER — NEPRO/CARBSTEADY PO LIQD
237.0000 mL | ORAL | Status: DC | PRN
Start: 2014-03-02 — End: 2014-03-02

## 2014-03-02 MED ORDER — PENTAFLUOROPROP-TETRAFLUOROETH EX AERO: 1.0000 "application " | INHALATION_SPRAY | CUTANEOUS | Status: DC | PRN

## 2014-03-02 MED ORDER — PENTAFLUOROPROP-TETRAFLUOROETH EX AERO
1.0000 | INHALATION_SPRAY | CUTANEOUS | Status: DC | PRN
Start: 2014-03-02 — End: 2014-03-02

## 2014-03-02 MED ORDER — HEPARIN SODIUM (PORCINE) 1000 UNIT/ML DIALYSIS: 1000.0000 [IU] | INTRAMUSCULAR | Status: DC | PRN

## 2014-03-02 MED ORDER — VANCOMYCIN 50 MG/ML ORAL SOLUTION
125.0000 mg | Freq: Four times a day (QID) | ORAL | Status: DC
  Administered 2014-03-02 – 2014-03-03 (×5): 125 mg via ORAL
  Filled 2014-03-02 (×8): qty 2.5

## 2014-03-02 MED ORDER — LIDOCAINE-PRILOCAINE 2.5-2.5 % EX CREA: 1.0000 "application " | TOPICAL_CREAM | CUTANEOUS | Status: DC | PRN

## 2014-03-02 MED ORDER — HEPARIN SODIUM (PORCINE) 1000 UNIT/ML DIALYSIS
1000.0000 [IU] | INTRAMUSCULAR | Status: DC | PRN
Start: 2014-03-02 — End: 2014-03-02

## 2014-03-02 MED ORDER — SODIUM CHLORIDE 0.9 % IV SOLN
100.0000 mL | INTRAVENOUS | Status: DC | PRN
Start: 2014-03-02 — End: 2014-03-02

## 2014-03-02 MED ORDER — TRAMADOL HCL 50 MG PO TABS
50.0000 mg | ORAL_TABLET | Freq: Four times a day (QID) | ORAL | Status: DC | PRN
Start: 2014-03-02 — End: 2014-03-03

## 2014-03-02 MED ORDER — HEPARIN SODIUM (PORCINE) 1000 UNIT/ML DIALYSIS
3000.0000 [IU] | Freq: Once | INTRAMUSCULAR | Status: DC
Start: 2014-03-02 — End: 2014-03-02

## 2014-03-02 MED ORDER — HYDROCODONE-ACETAMINOPHEN 5-325 MG PO TABS
1.0000 | ORAL_TABLET | Freq: Four times a day (QID) | ORAL | Status: DC | PRN
  Administered 2014-03-02: 2 via ORAL
  Filled 2014-03-02: qty 2

## 2014-03-02 NOTE — Progress Notes (Signed)
[facility] Progress Note   Subjective: still having bilat LE pain, somewhat chronic  Filed Vitals:   03/01/14 1759 03/01/14 2012 03/01/14 2050 03/02/14 0500  BP: 121/78 100/63  108/60  Pulse: 98 100  103  Temp: 98 F (36.7 C) 98.3 F (36.8 C)    TempSrc: Oral Oral    Resp: 18 18  18   Height:      Weight: 66.2 kg (145 lb 15.1 oz)   65.3 kg (143 lb 15.4 oz)  SpO2: 100% 100% 100% 92%   Exam: Alert, WM no distress, slightly tremulous No jvd Chest clear bilat RRR distant HS, only one heart sound noted, no MRG Abd distended, ?ascites, nontender No LE edema Neuro poor LE strength bilat dorsi/plantar flexion  HD: T[facility]  4h  64kg 3K/2.25 [place]  LUA AVF   Heparin 3000 Aranesp 80 q Wed, Venofer 50 mg q Wed tsat 15% s/p Fe load course, pth 87, P ok      Assessment: 1 Bilat leg pain and weakness - marked muscle weakness lower legs bilat, ABI's ordered, ?neuropathy with motor component 2 ESRD on HD 3 ETOH abuse, on CIWA protocol 4 HTN/vol - at dry wt, BP's low, have stopped some of his BP medications, remains on dilt and MTP 5 Anemia cont Fe, aranesp 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, on abx 7 Afib - on MTP, dilt, not good coumadin candidate 8 HPTH off vit D, cont phoslo  Plan- HD today    Vinson Moselleob Chandi Nicklin MD  pager [phone]    cell [phone]  03/02/2014, 10:53 AM     Recent Labs Lab 02/28/14 0314 03/02/14 0246  NA 134* 138  K 3.7 4.1  CL 92* 98  CO2 22 27  GLUCOSE 106* 110*  BUN 17 13  CREATININE 4.07* 3.18*  CALCIUM 8.2* 8.1*  PHOS 3.3  --    No results found for this basename: AST, ALT, ALKPHOS, BILITOT, PROT, ALBUMIN,  in the last 168 hours  Recent Labs Lab 02/28/14 0314 03/02/14 0246  WBC 18.3* 9.5  NEUTROABS 15.6*  --   HGB 9.4* 8.2*  HCT 27.4* 25.4*  MCV 98.2 100.8*  PLT 362 467*   . albuterol  2.5 mg Nebulization BID  . aspirin EC  325 mg Oral Daily  . budesonide-formoterol  2 puff Inhalation BID  . calcium  acetate  667 mg Oral Q supper  . [START ON 03/05/2014] darbepoetin (ARANESP) injection - DIALYSIS  80 mcg Intravenous Q Tue-HD  . diltiazem  180 mg Oral Daily  . escitalopram  10 mg Oral Daily  . famotidine  10 mg Oral BID  . [START ON 03/05/2014] ferric gluconate (FERRLECIT/NULECIT) IV  62.5 mg Intravenous Q Tue-HD  . folic acid  1 mg Oral Daily  . gabapentin  200 mg Oral TID  . heparin  3,000 Units Dialysis Once in dialysis  . heparin  5,000 Units Subcutaneous 3 times per day  . LORazepam  0-4 mg Intravenous Q12H  . metoprolol tartrate  25 mg Oral BID  . metroNIDAZOLE  500 mg Oral 3 times per day  . morphine  15 mg Oral Q12H  . multivitamin with minerals  1 tablet Oral Daily  . nicotine  7 mg Transdermal Daily  . roflumilast  500 mcg Oral Daily  . sodium chloride  3 mL Intravenous Q12H  . sodium chloride  3 mL Intravenous Q12H  . thiamine  100 mg Oral Daily   Or  .  thiamine  100 mg Intravenous Daily  . tiotropium  18 mcg Inhalation Daily  . vancomycin  125 mg Oral 4 times per day  . vitamin C  500 mg Oral Daily     sodium chloride, sodium chloride, sodium chloride, sodium chloride, sodium chloride, sodium chloride, sodium chloride, albuterol, alteplase, calcium carbonate, feeding supplement (NEPRO CARB STEADY), heparin, HYDROcodone-acetaminophen, isosorbide mononitrate, lidocaine (PF), lidocaine-prilocaine, LORazepam, LORazepam, pentafluoroprop-tetrafluoroeth, sodium chloride

## 2014-03-02 NOTE — Progress Notes (Signed)
PT Cancellation Note  Patient Details Name: Albert BrasilDouglas E Massey MRN: [id number] DOB: 09/21/1960   Cancelled Treatment:    Reason Eval/Treat Not Completed: Patient at procedure or test/unavailable (Pt in HD).  Will return tomorrow for evaluation.  Thanks.   Tawni MillersWhite, Hartley Wyke F 03/02/2014, 2:39 PM [employer] CorporationDawn Billy Rocco,PT Acute Rehabilitation [phone] (pager)

## 2014-03-02 NOTE — Progress Notes (Signed)
PROGRESS NOTE  Albert BrasilDouglas E Matto BJY:[id number] DOB: 09/04/1960 DOA: 02/28/2014 PCP: Wilmer FloorAMPBELL, STEPHEN D., MD  Assessment/Plan: Bilateral severe leg pain:  Appears to be plantar fascitis Hurts when pressure applied and when walking -- pain improved with boot/padding - pain control: MS contin, Gabapentin-- d/c dilaudid -will get ABIs as patient at risk for PVD  COPD: stable. Cough and shortness of breath at his baseline. Loss patient is clear bilaterally.  -continue home medications and inhalers.   CHF: 2-D echo on 02/10/14 showed EF 60-65% with grade 1 diastolic dysfunction. Currently patient is euvolumic clinically.  - continue ASA and Metoprolol  - continue blood pressure control: Amlodipine, clonidine, Cardizem,    HTN: continue home meds: Amlodipine, clonidine, Cardizem, metoprolol    history of alcohol abuse: Patient reports that he completed detoxification recently. Currently he is taking Naltrexone at home. He feels fine on admission, no signs of withdrawal.  - will start CIWA protocol since naltrexone is on hold   Prolonged QTs: 542  - will hold Trazodone  - avoid zofran   Leukocytosis due to c-diff colitis Still with c-diff positive results- has been on flagyl -add PO vancomycin  ESRD-HD: Cre 3.4 and BUN 14.  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   Consultants:  Renal  PT  Procedures:  HD    HPI/Subjective: Pain improved in feet  Objective: Filed Vitals:   03/02/14 0500  BP: 108/60  Pulse: 103  Temp:   Resp: 18    Intake/Output Summary (Last 24 hours) at 03/02/14 0856 Last data filed at 03/01/14 1759  Gross per 24 hour  Intake      0 ml  Output      0 ml  Net      0 ml   Filed Weights   03/01/14 1424 03/01/14 1759 03/02/14 0500  Weight: 66.2 kg (145 lb 15.1 oz) 66.2 kg (145 lb 15.1 oz) 65.3 kg (143 lb 15.4 oz)    Exam:   General:  A+Ox3,  NAd  Cardiovascular: rrr  Respiratory: clear  Abdomen: +BS, soft  Musculoskeletal: no edema  Data Reviewed: Basic Metabolic Panel:  Recent Labs Lab 02/28/14 0314 03/02/14 0246  NA 134* 138  K 3.7 4.1  CL 92* 98  CO2 22 27  GLUCOSE 106* 110*  BUN 17 13  CREATININE 4.07* 3.18*  CALCIUM 8.2* 8.1*  MG 2.0  --   PHOS 3.3  --    Liver Function Tests: No results found for this basename: AST, ALT, ALKPHOS, BILITOT, PROT, ALBUMIN,  in the last 168 hours No results found for this basename: LIPASE, AMYLASE,  in the last 168 hours No results found for this basename: AMMONIA,  in the last 168 hours CBC:  Recent Labs Lab 02/28/14 0314 03/02/14 0246  WBC 18.3* 9.5  NEUTROABS 15.6*  --   HGB 9.4* 8.2*  HCT 27.4* 25.4*  MCV 98.2 100.8*  PLT 362 467*   Cardiac Enzymes:  Recent Labs Lab 02/28/14 0316 02/28/14 0950 02/28/14 1445 03/01/14 1210  CKTOTAL  --   --   --  22  TROPONINI <0.30 <0.30 <0.30  --    BNP (last 3 results)  Recent Labs  02/09/14 1425  PROBNP >70000.0*   CBG: No results found for this basename: GLUCAP,  in the last 168 hours  Recent Results (from the past 240 hour(s))  CLOSTRIDIUM DIFFICILE BY PCR  Status: Abnormal   Collection Time    02/28/14  6:29 PM      Result Value Ref Range Status   C difficile by pcr POSITIVE (*) NEGATIVE Final   Comment: CRITICAL RESULT CALLED TO, READ BACK BY AND VERIFIED WITH:     LEWIS RN 9:50 03/01/14 (wilsonm)     Studies: No results found.  Scheduled Meds: . albuterol  2.5 mg Nebulization BID  . aspirin EC  325 mg Oral Daily  . budesonide-formoterol  2 puff Inhalation BID  . calcium acetate  667 mg Oral Q supper  . [START ON 03/05/2014] darbepoetin (ARANESP) injection - DIALYSIS  80 mcg Intravenous Q Tue-HD  . diltiazem  180 mg Oral Daily  . escitalopram  10 mg Oral Daily  . famotidine  10 mg Oral BID  . [START ON 03/05/2014] ferric gluconate (FERRLECIT/NULECIT) IV  62.5 mg Intravenous Q Tue-HD    . folic acid  1 mg Oral Daily  . gabapentin  200 mg Oral TID  . heparin  5,000 Units Subcutaneous 3 times per day  . LORazepam  0-4 mg Intravenous Q12H  . metoprolol tartrate  25 mg Oral BID  . metroNIDAZOLE  500 mg Oral 3 times per day  . morphine  15 mg Oral Q12H  . multivitamin with minerals  1 tablet Oral Daily  . nicotine  7 mg Transdermal Daily  . roflumilast  500 mcg Oral Daily  . sodium chloride  3 mL Intravenous Q12H  . sodium chloride  3 mL Intravenous Q12H  . thiamine  100 mg Oral Daily   Or  . thiamine  100 mg Intravenous Daily  . tiotropium  18 mcg Inhalation Daily  . vancomycin  125 mg Oral 4 times per day  . vitamin C  500 mg Oral Daily   Continuous Infusions:  Antibiotics Given (last 72 hours)   Date/Time Action Medication Dose   02/28/14 0534 Given   metroNIDAZOLE (FLAGYL) tablet 500 mg 500 mg   02/28/14 1128 Given   metroNIDAZOLE (FLAGYL) tablet 500 mg 500 mg   02/28/14 2144 Given   metroNIDAZOLE (FLAGYL) tablet 500 mg 500 mg   03/01/14 [id number] Given   metroNIDAZOLE (FLAGYL) tablet 500 mg 500 mg   03/01/14 1304 Given   metroNIDAZOLE (FLAGYL) tablet 500 mg 500 mg   03/01/14 2200 Given   metroNIDAZOLE (FLAGYL) tablet 500 mg 500 mg   03/02/14 0600 Given   metroNIDAZOLE (FLAGYL) tablet 500 mg 500 mg      Principal Problem:   Leg pain, bilateral Active Problems:   CKD (chronic kidney disease) stage V requiring chronic dialysis   Hyperlipidemia   COPD (chronic obstructive pulmonary disease)   ETOH abuse   Chronic systolic heart failure   OSA on CPAP   HTN (hypertension)   Tachycardia   Leukocytosis    Time spent: 35 min    Jahzaria Vary  [facility] Pager [phone] If 7PM-7AM, please contact night-coverage at [email or web address], password [email or web address] 03/02/2014, 8:56 AM  LOS: 2 days

## 2014-03-03 DIAGNOSIS — D72829 Elevated white blood cell count, unspecified: Secondary | ICD-10-CM

## 2014-03-03 MED ORDER — MORPHINE SULFATE ER 15 MG PO TBCR: 15.0000 mg | EXTENDED_RELEASE_TABLET | Freq: Two times a day (BID) | ORAL | Status: DC

## 2014-03-03 MED ORDER — GABAPENTIN 100 MG PO CAPS: 200.0000 mg | ORAL_CAPSULE | Freq: Two times a day (BID) | ORAL | Status: DC

## 2014-03-03 MED ORDER — VANCOMYCIN 50 MG/ML ORAL SOLUTION: 125.0000 mg | Freq: Four times a day (QID) | ORAL | Status: DC

## 2014-03-03 NOTE — Progress Notes (Signed)
Patient ambulated with physical therapy on RA and maintained sats 98-100%

## 2014-03-03 NOTE — Discharge Summary (Signed)
Physician Discharge Summary  [name] [id number] DOB: 02/24/1961 DOA: 02/28/2014  PCP: Wilmer FloorAMPBELL, STEPHEN D., MD  Admit date: 02/28/2014 Discharge date: 03/03/2014  Time spent: 35 minutes  Recommendations for Outpatient Follow-up:  1. outpt referral to podiatry/orth for plantar fascitis 2. ABI outpatient 3. PO vanc x 14 days- finish course of flagyl  Discharge Diagnoses:  Principal Problem:   Leg pain, bilateral Active Problems:   CKD (chronic kidney disease) stage V requiring chronic dialysis   Hyperlipidemia   COPD (chronic obstructive pulmonary disease)   ETOH abuse   Chronic systolic heart failure   OSA on CPAP   HTN (hypertension)   Tachycardia   Leukocytosis   Discharge Condition: improved  Diet recommendation: renal  Filed Weights   03/02/14 1352 03/02/14 1710 03/03/14 0500  Weight: 69.6 kg (153 lb 7 oz) 64.8 kg (142 lb 13.7 oz) 65.1 kg (143 lb 8.3 oz)    History of present illness:  Albert BrasilDouglas E Eisner is a 53 y.o. male with past medical history of end-stage renal disease on dialysis, COPD, congestive heart failure, hyperlipidemia, OSA, history of alcohol abuse, C. difficile colitis, who presents with severe bilateral leg pain.  [facility]. Patient reports that he has restless leg syndrome for which he took gabapentin some times. He also has chronic bilaterally leg pain, with unclear etiology. His leg pain became very severe since yesterday morning. He states that his pain is so bad that he feels sorb. He feels week in his legs due to pain and could not walk normally due to severe pain.  He has abdominal pain and diarrhea due to C. difficile colitis, which improved slightly since started metronidazole. He does not have fever or chills. He has mild cough and shortness of breath due to COPD, which are at his baseline. He had one episode of chest pain when he was on the way [facility]. His chest pain lasted for only a few seconds and then resolved completely. Now he is chest pain free.  He was to have leukocytosis of 22.4; negative chest x-ray; CK 24; INR 1.2, trop 0.06. ABG showed pH 7.56, PCO2 28, PaO2 60. Patient is admitted to inpatient for further evaluation to treatment.    Hospital Course:  Bilateral severe leg pain:  Appears to be plantar fascitis  Hurts when pressure applied and when walking -- pain improved with boot/padding  - pain control: MS contin, Gabapentin-- d/c dilaudid  -will get ABIs outpatient as patient at risk for PVD  COPD: stable. Cough and shortness of breath at his baseline. Loss patient is clear bilaterally.  -continue home medications and inhalers.  CHF: 2-D echo on 02/10/14 showed EF 60-65% with grade 1 diastolic dysfunction. Currently patient is euvolumic clinically.  - continue ASA and Metoprolol   HTN: stable off meds history of alcohol abuse: Patient reports that he completed detoxification recently. Currently he is taking Naltrexone at home. No signs of withdrawal  Prolonged QTs: 542  - will hold Trazodone  - avoid zofran   Leukocytosis due to c-diff colitis  Still with c-diff positive results- has been on flagyl  -add PO vancomycin for 14 days  ESRD-HD: Cre 3.4 and BUN 14.  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 If you have any questions about your discharge medications or the care you received while you were in the hospital after you are discharged, you can call the unit and asked to speak with the hospitalist on call if the hospitalist that took care of you is not available. Once you are discharged, your  primary care physician will handle any further medical issues. Please note that NO REFILLS for any discharge medications will be authorized once you are discharged, as it is imperative that you return to your primary care physician (or establish a relationship with a primary care physician if you do not have one) for your aftercare needs so that they can reassess your need for medications and monitor your lab values.  Discharge Instructions   Discharge instructions    Complete by:  As directed   Renal diet Finish flagyl prescription, oral vancomycin for 14 days- diarrhea should improve Outpatient ABIs Night splints for sleeping,   Good arch support in shoes diring the day; No more barefoot walking;   Use ice for pain and inflammation;   Go to Ortho or podiatry follow-up as outpatient Outpatient Physical Therapy for stretching, other modalities for plantar fasciitis pain     Increase activity slowly    Complete by:  As directed           Current Discharge Medication List    START taking these medications   Details  gabapentin (NEURONTIN) 100 MG capsule Take 2 capsules (200 mg total) by mouth 2 (two) times daily. Qty: 120 capsule, Refills: 0    morphine (MS CONTIN) 15 MG 12 hr tablet Take 1 tablet (15 mg total) by mouth every 12 (twelve) hours. Qty: 10 tablet, Refills: 0    vancomycin (VANCOCIN) 50 mg/mL oral solution Take 2.5 mLs (125 mg total) by mouth every 6 (six) hours. Qty: 140 mL, Refills: 0      CONTINUE these medications which have NOT CHANGED   Details  acetaminophen (TYLENOL) 500 MG tablet Take 1,000 mg by mouth every 6 (six) hours as needed for mild pain or moderate pain.    albuterol (PROVENTIL HFA;VENTOLIN HFA) 108 (90 BASE) MCG/ACT inhaler Inhale 2 puffs into the lungs every 6 (six) hours as needed for wheezing or shortness of breath.    ALPRAZolam (XANAX) 0.5 MG tablet Take 1 tablet (0.5 mg total) by mouth 3 (three) times daily as needed for anxiety. Qty: 20  tablet, Refills: 0    aspirin EC 325 MG EC tablet Take 1 tablet (325 mg total) by mouth daily. Qty: 30 tablet, Refills: 2    budesonide-formoterol (SYMBICORT) 80-4.5 MCG/ACT inhaler Inhale 2 puffs into the lungs 2 (two) times daily. Qty: 1 Inhaler, Refills: 1    calcium acetate (PHOSLO) 667 MG capsule Take 667 mg by mouth daily with supper.    calcium carbonate (TUMS EX) 750 MG chewable tablet Chew 1 tablet by mouth 2 (two) times daily as needed for heartburn.     diltiazem (CARDIZEM CD) 180 MG 24 hr capsule Take 1 capsule (180 mg total) by mouth daily. Qty: 60 capsule, Refills: 3    diphenhydrAMINE (BENADRYL) 25 MG tablet Take 25 mg by mouth every 6 (six) hours as needed for itching.     escitalopram (LEXAPRO) 10 MG tablet Take 1 tablet (10 mg total) by mouth daily. Qty: 30 tablet, Refills:  2    famotidine (PEPCID) 20 MG tablet Take 1 tablet (20 mg total) by mouth 2 (two) times daily. Qty: 60 tablet, Refills: 2    metoprolol tartrate (LOPRESSOR) 25 MG tablet Take 1 tablet (25 mg total) by mouth 2 (two) times daily. Qty: 60 tablet, Refills: 2    metroNIDAZOLE (FLAGYL) 500 MG tablet Take 1 tablet (500 mg total) by mouth 3 (three) times daily. Qty: 90 tablet, Refills: 0    naltrexone (DEPADE) 50 MG tablet Take 0.5 tablets (25 mg total) by mouth daily. Qty: 10 tablet, Refills: 0    nicotine (NICODERM CQ - DOSED IN MG/24 HR) 7 mg/24hr patch Place 1 patch (7 mg total) onto the skin daily. Qty: 28 patch, Refills: 0    roflumilast (DALIRESP) 500 MCG TABS tablet Take 500 mcg by mouth daily.    thiamine 100 MG tablet Take 1 tablet (100 mg total) by mouth daily. Qty: 30 tablet, Refills: 3    tiotropium (SPIRIVA) 18 MCG inhalation capsule Place 18 mcg into inhaler and inhale daily.    traZODone (DESYREL) 25 mg TABS tablet Take 0.5 tablets (25 mg total) by mouth at bedtime as needed for sleep. Qty: 30 tablet, Refills: 0    vitamin C (ASCORBIC ACID) 500 MG tablet Take 500 mg by mouth  daily.      STOP taking these medications     amLODipine (NORVASC) 10 MG tablet      cloNIDine (CATAPRES) 0.2 MG tablet      HYDROcodone-acetaminophen (NORCO/VICODIN) 5-325 MG per tablet      isosorbide mononitrate (IMDUR) 60 MG 24 hr tablet      Multiple Vitamin (MULTIVITAMIN WITH MINERALS) TABS tablet      isosorbide mononitrate (IMDUR) 60 MG 24 hr tablet        No Known Allergies Follow-up Information   Follow up with Wilmer Floor., MD.   Specialty:  Internal Medicine   Contact information:   [address] [place] [address] [phone]       Please follow up. (HD t/th/sat)       Please follow up. (podiatry follow up vs ortho for plantar fascitis)       Follow up with [facility] (will call you to schedule an appointment for outpt physical therapy)    Specialty:  Rehabilitation   Contact information:   [address] [id number] [place] [address] [phone]       The results of significant diagnostics from this hospitalization (including imaging, microbiology, ancillary and laboratory) are listed below for reference.    Significant Diagnostic Studies: Nm Myocar Multi W/spect W/wall Motion / Ef  02/14/2014   CLINICAL DATA:  Acute chest pain with hypertension, COPD, CHF  EXAM: MYOCARDIAL IMAGING WITH SPECT (REST AND PHARMACOLOGIC-STRESS)  GATED LEFT VENTRICULAR WALL MOTION STUDY  LEFT VENTRICULAR EJECTION FRACTION  TECHNIQUE: Standard myocardial SPECT imaging was performed after resting intravenous injection of 10 mCi Tc-47m sestamibi. Subsequently, intravenous infusion of Lexiscan was performed under the supervision of the Cardiology staff. At peak effect of the drug, 30 mCi Tc-28m sestamibi was injected intravenously and standard myocardial SPECT imaging was performed. Quantitative gated imaging was also performed to evaluate left ventricular wall motion, and estimate left ventricular  ejection fraction.  COMPARISON:  None.  FINDINGS: Perfusion: No decreased activity in the left ventricle on stress imaging to suggest reversible ischemia or infarction.  Wall Motion: Normal left ventricular wall motion. No left ventricular dilation.  Left  Ventricular Ejection Fraction: 55 %  End diastolic volume 133 ml  End systolic volume 60 ml  IMPRESSION: 1. No reversible ischemia or infarction.  2. Normal left ventricular wall motion.  3. Left ventricular ejection fraction 55%  4. Low-risk stress test findings*.  *2012 Appropriate Use Criteria for Coronary Revascularization Focused Update: J Am Coll Cardiol. 2012;59(9):857-881. http://content.dementiazones.comonlinejacc.org/article.aspx?articleid=1201161   Electronically Signed   By: Ruel Favorsrevor  Shick M.D.   On: 02/14/2014 13:52   Dg Chest Port 1 View  02/13/2014   CLINICAL DATA:  Hypoxia  EXAM: PORTABLE CHEST - 1 VIEW  COMPARISON:  02/09/2014  FINDINGS: Cardiomediastinal silhouette is stable. No acute infiltrate or pleural effusion. No pulmonary edema. Bony thorax is stable.  IMPRESSION: No active disease.   Electronically Signed   By: Natasha MeadLiviu  Pop M.D.   On: 02/13/2014 16:58   Dg Chest Portable 1 View  02/09/2014   CLINICAL DATA:  Acute chest pain with shortness of breath, history of hypertension and systolic heart failure as well as peripheral vascular disease, alcohol abuse, gastric ulcers, anxiety and depression with chronic renal disease and personal history of congestive heart failure  EXAM: PORTABLE CHEST - 1 VIEW  COMPARISON:  01/14/2014  FINDINGS: Moderate cardiac enlargement. Moderate vascular congestion. Mild peribronchial cuffing and interstitial prominence. More focal hazy left suprahilar opacity. No pleural effusions.  IMPRESSION: Findings suggest mild cardiogenic interstitial pulmonary edema. Recommend radiographic follow-up to ensure resolution of left suprahilar opacity which likely represents mildly asymmetric edema.   Electronically Signed   By: Esperanza Heiraymond   Rubner M.D.   On: 02/09/2014 14:33    Microbiology: Recent Results (from the past 240 hour(s))  CLOSTRIDIUM DIFFICILE BY PCR     Status: Abnormal   Collection Time    02/28/14  6:29 PM      Result Value Ref Range Status   C difficile by pcr POSITIVE (*) NEGATIVE Final   Comment: CRITICAL RESULT CALLED TO, READ BACK BY AND VERIFIED WITH:     LEWIS RN 9:50 03/01/14 (wilsonm)     Labs: Basic Metabolic Panel:  Recent Labs Lab 02/28/14 0314 03/02/14 0246  NA 134* 138  K 3.7 4.1  CL 92* 98  CO2 22 27  GLUCOSE 106* 110*  BUN 17 13  CREATININE 4.07* 3.18*  CALCIUM 8.2* 8.1*  MG 2.0  --   PHOS 3.3  --    Liver Function Tests: No results found for this basename: AST, ALT, ALKPHOS, BILITOT, PROT, ALBUMIN,  in the last 168 hours No results found for this basename: LIPASE, AMYLASE,  in the last 168 hours No results found for this basename: AMMONIA,  in the last 168 hours CBC:  Recent Labs Lab 02/28/14 0314 03/02/14 0246  WBC 18.3* 9.5  NEUTROABS 15.6*  --   HGB 9.4* 8.2*  HCT 27.4* 25.4*  MCV 98.2 100.8*  PLT 362 467*   Cardiac Enzymes:  Recent Labs Lab 02/28/14 0316 02/28/14 0950 02/28/14 1445 03/01/14 1210  CKTOTAL  --   --   --  22  TROPONINI <0.30 <0.30 <0.30  --    BNP: BNP (last 3 results)  Recent Labs  02/09/14 1425  PROBNP >70000.0*   CBG: No results found for this basename: GLUCAP,  in the last 168 hours     Signed:  Marlin CanaryVANN, Tija Biss  [facility] 03/03/2014, 2:26 PM

## 2014-03-03 NOTE — Progress Notes (Addendum)
Case management made aware of discharge to home with out patient PT

## 2014-03-03 NOTE — Evaluation (Signed)
Physical Therapy Evaluation Patient Details Name: [name] MRN: [id number] DOB: 02-26-1961 Today's Date: 03/03/2014   History of Present Illness  53 y/o male  h/o OSA, former smoker, Diastolic HF, ESRD TTS, PAD, COPD, Prior R Renal artery stent, poorly controlled Htn presented from dialysis Center with a multitude of complaints. Found to have Hypoxic respiratory failure, new onset A flutter; admitted with bil foot pain consistent with plantar fasciitis  Clinical Impression   Patient evaluated by Physical Therapy with no further acute PT needs identified. All education has been completed and the patient has no further questions.  See below for any follow-up Physical Therapy or equipment needs. PT is signing off. Thank you for this referral.  Gave pt the following recommendations (verbally and written) for his bil foot pain consistent with plantar fasciitis:  Night splints for sleeping,   Good arch support in shoes diring the day;  No more barefoot walking;   Use ice for pain and inflammation;   Go to Ortho or podiatry follow-up as outpatient  Outpatient Physical Therapy for stretching, other modalities for plantar fasciitis pain     Follow Up Recommendations Outpatient PT;Other (comment) (and intensive outpatient treatment for ETOH abuse)    Equipment Recommendations  Other (comment);Cane (Pt may already have)    Recommendations for Other Services       Precautions / Restrictions Precautions Precautions: None Precaution Comments: C. Diff Required Braces or Orthoses: Other Brace/Splint Other Brace/Splint: bil PRAFOs (with walking soles) for night splints Restrictions Weight Bearing Restrictions: No      Mobility  Bed Mobility Overal bed mobility: Independent                Transfers Overall transfer level: Independent Equipment used:  (wearing bil PRAFOs with walking soles) Transfers: Sit to/from Stand Sit to Stand: Modified independent  (Device/Increase time)            Ambulation/Gait Ambulation/Gait assistance: Supervision Ambulation Distance (Feet): 150 Feet Assistive device: None (wearing bil PRAFOs) Gait Pattern/deviations: Step-through pattern     General Gait Details: No gross Loss of Balance noted; no assistive device used; walked in PRAFOs (with walking soles); O2 sats 99-100% after walking on Room air  Stairs Stairs:  (Pt declined)          Wheelchair Mobility    Modified Rankin (Stroke Patients Only)       Balance             Standing balance-Leahy Scale: Good                               Pertinent Vitals/Pain Pain Assessment: 0-10 Pain Score: 5  Pain Location: bil plantar surfaces of feet; Pain incr greatly with calf stretching Pain Descriptors / Indicators: Aching Pain Intervention(s): Limited activity within patient's tolerance;Monitored during session;RN gave pain meds during session    Home Living Family/patient expects to be discharged to:: Private residence Living Arrangements: Alone Available Help at Discharge: Family;Available PRN/intermittently Type of Home: Apartment Home Access: Stairs to enter Entrance Stairs-Rails: Right Entrance Stairs-Number of Steps: 6 Home Layout: One level Home Equipment:  (Not sure that pt has a cane) Additional Comments: Pt plans to dc to his mother's apt, no steps to enter; He indicated to me he would like to get a cane here, from the hospital, but I did note from most recent admission earlier thsi month, it is documented he has a cane  Prior Function Level of Independence: Independent               Hand Dominance   Dominant Hand: Right    Extremity/Trunk Assessment   Upper Extremity Assessment: Overall WFL for tasks assessed           Lower Extremity Assessment: Overall WFL for tasks assessed (Bil plantar fascial pain; bil calf tightness; extreme pain with calf stretches)      Cervical / Trunk  Assessment: Normal  Communication   Communication: No difficulties  Cognition Arousal/Alertness: Awake/alert Behavior During Therapy: WFL for tasks assessed/performed Overall Cognitive Status: Within Functional Limits for tasks assessed       Memory: Decreased short-term memory (Unable to repeat back to me the recommendations for plantar fasciitis; appropriately asked for written list of recommendations)              General Comments      Exercises        Assessment/Plan    PT Assessment All further PT needs can be met in the next venue of care  PT Diagnosis Difficulty walking;Acute pain   PT Problem List Pain;Decreased range of motion;Decreased activity tolerance  PT Treatment Interventions Modalities;Manual techniques;Balance training;DME instruction;Gait training;Therapeutic activities;Therapeutic exercise   PT Goals (Current goals can be found in the Care Plan section) Acute Rehab PT Goals Patient Stated Goal: less pain with walking PT Goal Formulation: All assessment and education complete, DC therapy    Frequency     Barriers to discharge        Co-evaluation               End of Session   Activity Tolerance: Patient tolerated treatment well Patient left: in bed;with call bell/phone within reach (sitting EOB) Nurse Communication: Mobility status         Time: 2694-8546 PT Time Calculation (min): 33 min   Charges:   PT Evaluation $Initial PT Evaluation Tier I: 1 Procedure PT Treatments $Gait Training: 8-22 mins $Therapeutic Activity: 8-22 mins   PT G Codes:          [name] 03/03/2014, 11:27 AM  [name], [place] Pager [phone] Office [phone]

## 2014-03-03 NOTE — Care Management Note (Signed)
Page 1 of 1   03/03/2014     2:13:18 PM CARE MANAGEMENT NOTE 03/03/2014  Patient:  [name], [name]   Account Number:  [id number]  Date Initiated:  03/03/2014  Documentation initiated by:  [facility]  Subjective/Objective Assessment:   adm: AMS, pna: ETOH abuse     Action/Plan:   discharge planning   Anticipated DC Date:  03/03/2014   Anticipated DC Plan:  [facility]  CM consult      Choice offered to / List presented to:             Status of service:  Completed, signed off Medicare Important Message given?   (If response is "NO", the following Medicare IM given date fields will be blank) Date Medicare IM given:   Medicare IM given by:   Date Additional Medicare IM given:   Additional Medicare IM given by:    Discharge Disposition:  HOME/SELF CARE  Per UR Regulation:    If discussed at Long Length of Stay Meetings, dates discussed:    Comments:  03/03/14 14:00 CM received call from RN to arrange Outpt PT and possible home oxygen.  CM went to unit and RN and CM confirmed no home oxygen need per [facility]  PT EVAL note (pt satting 99-100%) room air.  Cm met with pt who states his daughter will provide transportation home.  [facility] goes to [facility].  CM explained his MD has ordered outpt PT (pt's Medicaid will not cover and pt not able to afford selfpay HHPT).  CM faxed order to [facility] who will call pt to arrange schedule.  No other CM needs were communicated.  Mariane Masters, BSn, CM [phone].

## 2014-03-04 LAB — ALDOLASE: ALDOLASE: 5.6 U/L (ref <=8.1)

## 2014-03-18 ENCOUNTER — Other Ambulatory Visit: Payer: Self-pay | Admitting: Internal Medicine

## 2014-04-18 ENCOUNTER — Encounter (HOSPITAL_COMMUNITY): Payer: Self-pay | Admitting: Cardiovascular Disease

## 2014-05-07 ENCOUNTER — Inpatient Hospital Stay (HOSPITAL_COMMUNITY)
Admission: AD | Admit: 2014-05-07 | Discharge: 2014-05-14 | DRG: 488 | Disposition: A | Discharge status: Skilled Nursing Facility | Payer: Medicaid Other | Source: Other Acute Inpatient Hospital | Attending: Internal Medicine | Admitting: Internal Medicine

## 2014-05-07 ENCOUNTER — Encounter (HOSPITAL_COMMUNITY): Payer: Self-pay | Admitting: Internal Medicine

## 2014-05-07 ENCOUNTER — Other Ambulatory Visit: Payer: Self-pay

## 2014-05-07 DIAGNOSIS — S83282A Other tear of lateral meniscus, current injury, left knee, initial encounter: Secondary | ICD-10-CM | POA: Diagnosis present

## 2014-05-07 DIAGNOSIS — F909 Attention-deficit hyperactivity disorder, unspecified type: Secondary | ICD-10-CM | POA: Diagnosis present

## 2014-05-07 DIAGNOSIS — F1010 Alcohol abuse, uncomplicated: Secondary | ICD-10-CM | POA: Diagnosis present

## 2014-05-07 DIAGNOSIS — J410 Simple chronic bronchitis: Secondary | ICD-10-CM

## 2014-05-07 DIAGNOSIS — A0472 Enterocolitis due to Clostridium difficile, not specified as recurrent: Secondary | ICD-10-CM | POA: Insufficient documentation

## 2014-05-07 DIAGNOSIS — J449 Chronic obstructive pulmonary disease, unspecified: Secondary | ICD-10-CM | POA: Diagnosis present

## 2014-05-07 DIAGNOSIS — M1990 Unspecified osteoarthritis, unspecified site: Secondary | ICD-10-CM | POA: Diagnosis present

## 2014-05-07 DIAGNOSIS — Z993 Dependence on wheelchair: Secondary | ICD-10-CM | POA: Diagnosis not present

## 2014-05-07 DIAGNOSIS — A047 Enterocolitis due to Clostridium difficile: Secondary | ICD-10-CM | POA: Diagnosis present

## 2014-05-07 DIAGNOSIS — I5022 Chronic systolic (congestive) heart failure: Secondary | ICD-10-CM | POA: Diagnosis present

## 2014-05-07 DIAGNOSIS — N2581 Secondary hyperparathyroidism of renal origin: Secondary | ICD-10-CM | POA: Diagnosis present

## 2014-05-07 DIAGNOSIS — D649 Anemia, unspecified: Secondary | ICD-10-CM | POA: Diagnosis present

## 2014-05-07 DIAGNOSIS — G4733 Obstructive sleep apnea (adult) (pediatric): Secondary | ICD-10-CM | POA: Diagnosis present

## 2014-05-07 DIAGNOSIS — Z992 Dependence on renal dialysis: Secondary | ICD-10-CM | POA: Diagnosis not present

## 2014-05-07 DIAGNOSIS — I120 Hypertensive chronic kidney disease with stage 5 chronic kidney disease or end stage renal disease: Secondary | ICD-10-CM | POA: Diagnosis present

## 2014-05-07 DIAGNOSIS — I4581 Long QT syndrome: Secondary | ICD-10-CM | POA: Diagnosis present

## 2014-05-07 DIAGNOSIS — D62 Acute posthemorrhagic anemia: Secondary | ICD-10-CM | POA: Diagnosis not present

## 2014-05-07 DIAGNOSIS — I739 Peripheral vascular disease, unspecified: Secondary | ICD-10-CM | POA: Diagnosis present

## 2014-05-07 DIAGNOSIS — I4891 Unspecified atrial fibrillation: Secondary | ICD-10-CM | POA: Diagnosis present

## 2014-05-07 DIAGNOSIS — R197 Diarrhea, unspecified: Secondary | ICD-10-CM | POA: Diagnosis present

## 2014-05-07 DIAGNOSIS — F419 Anxiety disorder, unspecified: Secondary | ICD-10-CM | POA: Diagnosis present

## 2014-05-07 DIAGNOSIS — M25462 Effusion, left knee: Secondary | ICD-10-CM | POA: Diagnosis present

## 2014-05-07 DIAGNOSIS — Z9119 Patient's noncompliance with other medical treatment and regimen: Secondary | ICD-10-CM | POA: Diagnosis present

## 2014-05-07 DIAGNOSIS — J9601 Acute respiratory failure with hypoxia: Secondary | ICD-10-CM | POA: Diagnosis present

## 2014-05-07 DIAGNOSIS — M009 Pyogenic arthritis, unspecified: Principal | ICD-10-CM | POA: Diagnosis present

## 2014-05-07 DIAGNOSIS — E785 Hyperlipidemia, unspecified: Secondary | ICD-10-CM | POA: Diagnosis present

## 2014-05-07 DIAGNOSIS — M25562 Pain in left knee: Secondary | ICD-10-CM | POA: Diagnosis present

## 2014-05-07 DIAGNOSIS — N186 End stage renal disease: Secondary | ICD-10-CM | POA: Insufficient documentation

## 2014-05-07 DIAGNOSIS — I5042 Chronic combined systolic (congestive) and diastolic (congestive) heart failure: Secondary | ICD-10-CM | POA: Diagnosis present

## 2014-05-07 DIAGNOSIS — A419 Sepsis, unspecified organism: Secondary | ICD-10-CM | POA: Diagnosis present

## 2014-05-07 DIAGNOSIS — Z9114 Patient's other noncompliance with medication regimen: Secondary | ICD-10-CM | POA: Diagnosis present

## 2014-05-07 DIAGNOSIS — I4892 Unspecified atrial flutter: Secondary | ICD-10-CM | POA: Diagnosis present

## 2014-05-07 DIAGNOSIS — F17210 Nicotine dependence, cigarettes, uncomplicated: Secondary | ICD-10-CM | POA: Diagnosis present

## 2014-05-07 DIAGNOSIS — I701 Atherosclerosis of renal artery: Secondary | ICD-10-CM | POA: Diagnosis present

## 2014-05-07 DIAGNOSIS — D6489 Other specified anemias: Secondary | ICD-10-CM | POA: Insufficient documentation

## 2014-05-07 HISTORY — DX: Pyogenic arthritis, unspecified: M00.9

## 2014-05-07 LAB — CBC WITH DIFFERENTIAL/PLATELET
Basophils Absolute: 0 10*3/uL (ref 0.0–0.1)
Basophils Relative: 0 % (ref 0–1)
EOS PCT: 0 % (ref 0–5)
Eosinophils Absolute: 0 10*3/uL (ref 0.0–0.7)
HEMATOCRIT: 23.3 % — AB (ref 39.0–52.0)
Hemoglobin: 7.3 g/dL — ABNORMAL LOW (ref 13.0–17.0)
LYMPHS ABS: 0.2 10*3/uL — AB (ref 0.7–4.0)
LYMPHS PCT: 1 % — AB (ref 12–46)
MCH: 27.9 pg (ref 26.0–34.0)
MCHC: 31.3 g/dL (ref 30.0–36.0)
MCV: 88.9 fL (ref 78.0–100.0)
MONO ABS: 0.1 10*3/uL (ref 0.1–1.0)
MONOS PCT: 0 % — AB (ref 3–12)
Neutro Abs: 14 10*3/uL — ABNORMAL HIGH (ref 1.7–7.7)
Neutrophils Relative %: 99 % — ABNORMAL HIGH (ref 43–77)
Platelets: 487 10*3/uL — ABNORMAL HIGH (ref 150–400)
RBC: 2.62 MIL/uL — AB (ref 4.22–5.81)
RDW: 16.8 % — ABNORMAL HIGH (ref 11.5–15.5)
WBC: 14.3 10*3/uL — ABNORMAL HIGH (ref 4.0–10.5)

## 2014-05-07 LAB — TROPONIN I: Troponin I: 0.04 ng/mL — ABNORMAL HIGH (ref <0.031)

## 2014-05-07 LAB — COMPREHENSIVE METABOLIC PANEL
ALBUMIN: 1.9 g/dL — AB (ref 3.5–5.2)
ALT: 9 U/L (ref 0–53)
AST: 13 U/L (ref 0–37)
Alkaline Phosphatase: 122 U/L — ABNORMAL HIGH (ref 39–117)
Anion gap: 16 — ABNORMAL HIGH (ref 5–15)
BUN: 55 mg/dL — AB (ref 6–23)
CO2: 19 mmol/L (ref 19–32)
CREATININE: 9.29 mg/dL — AB (ref 0.50–1.35)
Calcium: 8 mg/dL — ABNORMAL LOW (ref 8.4–10.5)
Chloride: 97 mEq/L (ref 96–112)
GFR calc Af Amer: 7 mL/min — ABNORMAL LOW (ref >90)
GFR calc non Af Amer: 6 mL/min — ABNORMAL LOW (ref >90)
GLUCOSE: 155 mg/dL — AB (ref 70–99)
POTASSIUM: 4 mmol/L (ref 3.5–5.1)
Sodium: 132 mmol/L — ABNORMAL LOW (ref 135–145)
Total Bilirubin: 0.3 mg/dL (ref 0.3–1.2)
Total Protein: 5.5 g/dL — ABNORMAL LOW (ref 6.0–8.3)

## 2014-05-07 LAB — SEDIMENTATION RATE: Sed Rate: 90 mm/hr — ABNORMAL HIGH (ref 0–16)

## 2014-05-07 LAB — ABO/RH: ABO/RH(D): O POS

## 2014-05-07 LAB — MRSA PCR SCREENING: MRSA by PCR: NEGATIVE

## 2014-05-07 LAB — PROTIME-INR
INR: 1.19 (ref 0.00–1.49)
Prothrombin Time: 15.2 seconds (ref 11.6–15.2)

## 2014-05-07 LAB — RETICULOCYTES
RBC.: 2.62 MIL/uL — ABNORMAL LOW (ref 4.22–5.81)
RETIC COUNT ABSOLUTE: 52.4 10*3/uL (ref 19.0–186.0)
RETIC CT PCT: 2 % (ref 0.4–3.1)

## 2014-05-07 MED ORDER — SODIUM CHLORIDE 0.9 % IJ SOLN: 3.0000 mL | INTRAMUSCULAR | Status: DC | PRN

## 2014-05-07 MED ORDER — MORPHINE SULFATE 4 MG/ML IJ SOLN
4.0000 mg | INTRAMUSCULAR | Status: DC | PRN
  Administered 2014-05-07 – 2014-05-08 (×2): 4 mg via INTRAVENOUS
  Filled 2014-05-07 (×2): qty 1

## 2014-05-07 MED ORDER — LEVALBUTEROL HCL 0.63 MG/3ML IN NEBU
0.6300 mg | INHALATION_SOLUTION | Freq: Four times a day (QID) | RESPIRATORY_TRACT | Status: DC | PRN
  Administered 2014-05-09: 0.63 mg via RESPIRATORY_TRACT
  Filled 2014-05-07 (×2): qty 3

## 2014-05-07 MED ORDER — VANCOMYCIN 50 MG/ML ORAL SOLUTION: 125.0000 mg | ORAL | Status: DC

## 2014-05-07 MED ORDER — TRAZODONE 25 MG HALF TABLET
25.0000 mg | ORAL_TABLET | Freq: Every evening | ORAL | Status: DC | PRN
  Filled 2014-05-07: qty 1

## 2014-05-07 MED ORDER — DILTIAZEM HCL ER COATED BEADS 180 MG PO CP24
180.0000 mg | ORAL_CAPSULE | Freq: Every day | ORAL | Status: DC
  Administered 2014-05-07 – 2014-05-14 (×8): 180 mg via ORAL
  Filled 2014-05-07 (×8): qty 1

## 2014-05-07 MED ORDER — FOLIC ACID 1 MG PO TABS
1.0000 mg | ORAL_TABLET | Freq: Every day | ORAL | Status: DC
  Administered 2014-05-07 – 2014-05-14 (×8): 1 mg via ORAL
  Filled 2014-05-07 (×8): qty 1

## 2014-05-07 MED ORDER — ALPRAZOLAM 0.5 MG PO TABS: 0.5000 mg | ORAL_TABLET | Freq: Three times a day (TID) | ORAL | Status: DC | PRN

## 2014-05-07 MED ORDER — SODIUM CHLORIDE 0.9 % IV SOLN: 250.0000 mL | INTRAVENOUS | Status: DC | PRN

## 2014-05-07 MED ORDER — GUAIFENESIN ER 600 MG PO TB12
600.0000 mg | ORAL_TABLET | Freq: Two times a day (BID) | ORAL | Status: DC
  Administered 2014-05-07 – 2014-05-14 (×14): 600 mg via ORAL
  Filled 2014-05-07 (×15): qty 1

## 2014-05-07 MED ORDER — ONDANSETRON HCL 4 MG PO TABS: 4.0000 mg | ORAL_TABLET | Freq: Four times a day (QID) | ORAL | Status: DC | PRN

## 2014-05-07 MED ORDER — VANCOMYCIN 50 MG/ML ORAL SOLUTION: 125.0000 mg | Freq: Every day | ORAL | Status: DC

## 2014-05-07 MED ORDER — HYDROCODONE-ACETAMINOPHEN 5-325 MG PO TABS: 1.0000 | ORAL_TABLET | ORAL | Status: DC | PRN

## 2014-05-07 MED ORDER — GABAPENTIN 100 MG PO CAPS
200.0000 mg | ORAL_CAPSULE | Freq: Two times a day (BID) | ORAL | Status: DC
  Administered 2014-05-07 – 2014-05-14 (×14): 200 mg via ORAL
  Filled 2014-05-07 (×15): qty 2

## 2014-05-07 MED ORDER — NICOTINE 7 MG/24HR TD PT24
7.0000 mg | MEDICATED_PATCH | Freq: Every day | TRANSDERMAL | Status: DC
  Administered 2014-05-07 – 2014-05-14 (×8): 7 mg via TRANSDERMAL
  Filled 2014-05-07 (×8): qty 1

## 2014-05-07 MED ORDER — PANTOPRAZOLE SODIUM 40 MG PO TBEC: 40.0000 mg | DELAYED_RELEASE_TABLET | Freq: Every day | ORAL | Status: DC

## 2014-05-07 MED ORDER — LORAZEPAM 2 MG/ML IJ SOLN: 1.0000 mg | Freq: Four times a day (QID) | INTRAMUSCULAR | Status: AC | PRN

## 2014-05-07 MED ORDER — ROFLUMILAST 500 MCG PO TABS
500.0000 ug | ORAL_TABLET | Freq: Every day | ORAL | Status: DC
  Administered 2014-05-07 – 2014-05-13 (×7): 500 ug via ORAL
  Filled 2014-05-07 (×9): qty 1

## 2014-05-07 MED ORDER — LORAZEPAM 0.5 MG PO TABS
0.0000 mg | ORAL_TABLET | Freq: Four times a day (QID) | ORAL | Status: AC
  Administered 2014-05-07: 1 mg via ORAL
  Administered 2014-05-08: 2 mg via ORAL
  Administered 2014-05-09: 1 mg via ORAL
  Filled 2014-05-07: qty 8
  Filled 2014-05-07: qty 2

## 2014-05-07 MED ORDER — SODIUM CHLORIDE 0.9 % IJ SOLN
3.0000 mL | Freq: Two times a day (BID) | INTRAMUSCULAR | Status: DC
Start: 2014-05-07 — End: 2014-05-14
  Administered 2014-05-07 – 2014-05-13 (×13): 3 mL via INTRAVENOUS

## 2014-05-07 MED ORDER — LORAZEPAM 0.5 MG PO TABS
0.0000 mg | ORAL_TABLET | Freq: Two times a day (BID) | ORAL | Status: AC
  Administered 2014-05-11: 1 mg via ORAL
  Administered 2014-05-11: 2 mg via ORAL
  Filled 2014-05-07: qty 4
  Filled 2014-05-07 (×3): qty 2

## 2014-05-07 MED ORDER — IPRATROPIUM BROMIDE 0.02 % IN SOLN
0.5000 mg | Freq: Four times a day (QID) | RESPIRATORY_TRACT | Status: DC
Start: 2014-05-07 — End: 2014-05-12
  Administered 2014-05-08 – 2014-05-12 (×14): 0.5 mg via RESPIRATORY_TRACT
  Filled 2014-05-07 (×15): qty 2.5

## 2014-05-07 MED ORDER — OXYCODONE HCL 5 MG PO TABS
5.0000 mg | ORAL_TABLET | Freq: Four times a day (QID) | ORAL | Status: DC | PRN
  Administered 2014-05-08: 5 mg via ORAL
  Filled 2014-05-07: qty 1

## 2014-05-07 MED ORDER — METOPROLOL TARTRATE 25 MG PO TABS
25.0000 mg | ORAL_TABLET | Freq: Two times a day (BID) | ORAL | Status: DC
  Administered 2014-05-07 – 2014-05-14 (×13): 25 mg via ORAL
  Filled 2014-05-07 (×15): qty 1

## 2014-05-07 MED ORDER — DILTIAZEM HCL 100 MG IV SOLR
5.0000 mg/h | INTRAVENOUS | Status: DC
  Administered 2014-05-07: 17.5 mg/h via INTRAVENOUS
  Filled 2014-05-07: qty 100

## 2014-05-07 MED ORDER — ADULT MULTIVITAMIN W/MINERALS CH
1.0000 | ORAL_TABLET | Freq: Every day | ORAL | Status: DC
  Administered 2014-05-07 – 2014-05-14 (×8): 1 via ORAL
  Filled 2014-05-07 (×8): qty 1

## 2014-05-07 MED ORDER — BUDESONIDE-FORMOTEROL FUMARATE 80-4.5 MCG/ACT IN AERO
2.0000 | INHALATION_SPRAY | Freq: Two times a day (BID) | RESPIRATORY_TRACT | Status: DC
  Administered 2014-05-07 – 2014-05-13 (×8): 2 via RESPIRATORY_TRACT
  Filled 2014-05-07 (×3): qty 6.9

## 2014-05-07 MED ORDER — VANCOMYCIN 50 MG/ML ORAL SOLUTION
125.0000 mg | Freq: Four times a day (QID) | ORAL | Status: DC
  Administered 2014-05-07 – 2014-05-14 (×23): 125 mg via ORAL
  Filled 2014-05-07 (×30): qty 2.5

## 2014-05-07 MED ORDER — LORAZEPAM 0.5 MG PO TABS
1.0000 mg | ORAL_TABLET | Freq: Four times a day (QID) | ORAL | Status: AC | PRN
  Administered 2014-05-08 – 2014-05-10 (×6): 1 mg via ORAL
  Filled 2014-05-07 (×5): qty 2

## 2014-05-07 MED ORDER — THIAMINE HCL 100 MG/ML IJ SOLN
100.0000 mg | Freq: Every day | INTRAMUSCULAR | Status: DC
  Filled 2014-05-07 (×3): qty 1

## 2014-05-07 MED ORDER — VANCOMYCIN 50 MG/ML ORAL SOLUTION
125.0000 mg | Freq: Two times a day (BID) | ORAL | Status: DC
  Filled 2014-05-07: qty 2.5

## 2014-05-07 MED ORDER — ESCITALOPRAM OXALATE 10 MG PO TABS
10.0000 mg | ORAL_TABLET | Freq: Every day | ORAL | Status: DC
  Administered 2014-05-07 – 2014-05-11 (×5): 10 mg via ORAL
  Filled 2014-05-07 (×6): qty 1

## 2014-05-07 MED ORDER — VITAMIN B-1 100 MG PO TABS
100.0000 mg | ORAL_TABLET | Freq: Every day | ORAL | Status: DC
  Administered 2014-05-07 – 2014-05-14 (×8): 100 mg via ORAL
  Filled 2014-05-07 (×8): qty 1

## 2014-05-07 MED ORDER — METOPROLOL TARTRATE 1 MG/ML IV SOLN: 2.5000 mg | INTRAVENOUS | Status: DC | PRN

## 2014-05-07 MED ORDER — ONDANSETRON HCL 4 MG/2ML IJ SOLN: 4.0000 mg | Freq: Four times a day (QID) | INTRAMUSCULAR | Status: DC | PRN

## 2014-05-07 MED ORDER — DEXTROSE 5 % IV SOLN
5.0000 mg/h | INTRAVENOUS | Status: DC
  Administered 2014-05-07: 17.5 mg/h via INTRAVENOUS
  Filled 2014-05-07: qty 100

## 2014-05-07 MED ORDER — SODIUM CHLORIDE 0.9 % IJ SOLN
3.0000 mL | Freq: Two times a day (BID) | INTRAMUSCULAR | Status: DC
  Administered 2014-05-07 – 2014-05-14 (×9): 3 mL via INTRAVENOUS

## 2014-05-07 NOTE — H&P (Addendum)
PCP: Wilmer FloorAMPBELL, STEPHEN D., MD  Nephrology [name]  Chief Complaint:  Transfers from [facility] with knee infection versus inflammation, A flutter with RVR and end-stage renal disease  HPI: [name] is a 53 y.o. male   has a past medical history of Hypertension; Systolic heart failure; Hyperlipidemia; COPD (chronic obstructive pulmonary disease); Peripheral vascular disease; Pneumonia (1980's); OSA on CPAP; ETOH abuse; History of stomach ulcers; Arthritis; Chronic lower back pain; Anxiety; Depression; Adult ADHD; Chronic kidney disease (CKD), stage III (moderate); Renal cyst; Renal artery stenosis; Anginal pain; CHF (congestive heart failure); Seasonal allergies; and Hepatitis.   Presented with  [place] with history of end-stage renal disease on dialysis Tuesday first day and Saturday . History of chronic leg pain of unclear etiology for the past 3 weeks he been having worsening left knee pain. Patient state he woke up with a swollen knee. 3 weeks ago he had an arthrocentesis performed in emergency department [facility] no growth on culture, no crystals was noted. His left knee pain has returned 2 days ago and he presented to emergency department again. He was found to be in A. fib with RVR up to 150. Of note patient has run out of his Lopressor hasn't been taking it for the past few days.  [facility] he had had an arthrocentesis performed as well a report showing 50,457 white blood cells, no bacteria seen. He has had some worsening swelling i the entire lower extremety today. NO fever no chills. Orthopedics was called and felt this is likely inflammatory. Patient remained tachycardiac despite 2 doses of Lopressor 5 mg IV. Cardiology consult was called  And recommended initiation of Cardizem drip.  His labs was significant for white blood cell count of 14 hemoglobin of 7.3, potassium 3.9 and creatinine 8.4 chest x-ray was showing mild venous congestion. Patient  was [facility] step down unit for further evaluation and treatment of his A flutter, given history of end-stage renal disease.  Of note patient had had history of C. Difficile reports being currently on flagyl that he takes inconsistently. He was supposed to be on vancomycin but unsure why ran out. He continues to have diarrhea.  He has history of alcohol abuse but states have not been drinking much. Has been on benzodiazepines for anxiety.  His Last HD was on Saturday. States for the past 1 week he has not been getting full treatment due to machine malfunction. Access a fistula in Left arm.   Hospitalist was called for admission for a.flutter with RVR, ESRD  Review of Systems:    Pertinent positives include: diarrhea, left knee pain  Constitutional:  No weight loss, night sweats, Fevers, chills, fatigue, weight loss  HEENT:  No headaches, Difficulty swallowing,Tooth/dental problems,Sore throat,  No sneezing, itching, ear ache, nasal congestion, post nasal drip,  Cardio-vascular:  No chest pain, Orthopnea, PND, anasarca, dizziness, palpitations.no Bilateral lower extremity swelling  GI:  No heartburn, indigestion, abdominal pain, nausea, vomiting, change in bowel habits, loss of appetite, melena, blood in stool, hematemesis Resp:  no shortness of breath at rest. No dyspnea on exertion, No excess mucus, no productive cough, No non-productive cough, No coughing up of blood.No change in color of mucus.No wheezing. Skin:  no rash or lesions. No jaundice GU:  no dysuria, change in color of urine, no urgency or frequency. No straining to urinate.  No flank pain.  Musculoskeletal:  No joint pain or no joint swelling. No decreased range of  motion. No back pain.  Psych:  No change in mood or affect. No depression or anxiety. No memory loss.  Neuro: no localizing neurological complaints, no tingling, no weakness, no double vision, no gait abnormality, no slurred speech, no  confusion  Otherwise ROS are negative except for above, 10 systems were reviewed  Past Medical History: Past Medical History  Diagnosis Date  . Hypertension   . Systolic heart failure     EF 16-10%  . Hyperlipidemia   . COPD (chronic obstructive pulmonary disease)   . Peripheral vascular disease   . Pneumonia 1980's  . OSA on CPAP   . ETOH abuse   . History of stomach ulcers   . Arthritis     "arms"  . Chronic lower back pain   . Anxiety   . Depression   . Adult ADHD     "never diagnosed; my son was; I think I've got it too"  . Chronic kidney disease (CKD), stage III (moderate)     baseline cr 2.3  . Renal cyst     left, complex  . Renal artery stenosis     a. s/p BMS to inferior branch of right renal artery 12/2011. On left, 3 renal arteries were noted, 2 of them were very small in size and subtotally occluded  . Anginal pain   . CHF (congestive heart failure)   . Seasonal allergies   . Hepatitis     "I think it was C"   Past Surgical History  Procedure Laterality Date  . Renal artery stent  01/05/2012    right inferior  . Av fistula placement Left 10/10/2012    Procedure: ARTERIOVENOUS (AV) FISTULA CREATION;  Surgeon: Sherren Kerns, MD;  Location: [facility] OR;  Service: Vascular;  Laterality: Left;  Brachio-cephalic  . Abdominal aortagram N/A 01/05/2012    Procedure: ABDOMINAL Ronny Flurry;  Surgeon: Iran Ouch, MD;  Location: [facility] CATH LAB;  Service: Cardiovascular;  Laterality: N/A;  . Renal angiogram N/A 01/05/2012    Procedure: RENAL ANGIOGRAM;  Surgeon: Iran Ouch, MD;  Location: [facility] CATH LAB;  Service: Cardiovascular;  Laterality: N/A;     Medications: Prior to Admission medications   Medication Sig Start Date End Date Taking? Authorizing Provider  albuterol (PROVENTIL HFA;VENTOLIN HFA) 108 (90 BASE) MCG/ACT inhaler Inhale 2 puffs into the lungs every 6 (six) hours as needed for wheezing or shortness of breath.   Yes Historical Provider, MD  ALPRAZolam  Prudy Feeler) 0.5 MG tablet Take 1 tablet (0.5 mg total) by mouth 3 (three) times daily as needed for anxiety. 02/18/14  Yes Richarda Overlie, MD  budesonide-formoterol (SYMBICORT) 80-4.5 MCG/ACT inhaler Inhale 2 puffs into the lungs 2 (two) times daily. 01/17/14  Yes Zannie Cove, MD  calcium acetate (PHOSLO) 667 MG capsule Take 667 mg by mouth daily with supper.   Yes Historical Provider, MD  calcium carbonate (TUMS EX) 750 MG chewable tablet Chew 1 tablet by mouth 2 (two) times daily as needed for heartburn.    Yes Historical Provider, MD  diltiazem (CARDIZEM CD) 180 MG 24 hr capsule Take 1 capsule (180 mg total) by mouth daily. 02/18/14  Yes Richarda Overlie, MD  diphenhydrAMINE (BENADRYL) 25 MG tablet Take 25 mg by mouth every 6 (six) hours as needed for itching.    Yes Historical Provider, MD  famotidine (PEPCID) 20 MG tablet Take 1 tablet (20 mg total) by mouth 2 (two) times daily. 02/18/14  Yes Richarda Overlie, MD  gabapentin (NEURONTIN) 100 MG  capsule Take 2 capsules (200 mg total) by mouth 2 (two) times daily. 03/03/14  Yes Joseph Art, DO  metoprolol tartrate (LOPRESSOR) 25 MG tablet Take 1 tablet (25 mg total) by mouth 2 (two) times daily. 02/18/14  Yes Richarda Overlie, MD  metroNIDAZOLE (FLAGYL) 500 MG tablet Take 1 tablet (500 mg total) by mouth 3 (three) times daily. 02/18/14  Yes Richarda Overlie, MD  nicotine (NICODERM CQ - DOSED IN MG/24 HR) 7 mg/24hr patch Place 1 patch (7 mg total) onto the skin daily. 02/18/14  Yes Richarda Overlie, MD  oxyCODONE (OXY IR/ROXICODONE) 5 MG immediate release tablet Take 5 mg by mouth 2 (two) times daily as needed. 04/19/14  Yes Historical Provider, MD  roflumilast (DALIRESP) 500 MCG TABS tablet Take 500 mcg by mouth daily.   Yes Historical Provider, MD  thiamine 100 MG tablet Take 1 tablet (100 mg total) by mouth daily. 02/18/14  Yes Richarda Overlie, MD  tiotropium (SPIRIVA) 18 MCG inhalation capsule Place 18 mcg into inhaler and inhale daily.   Yes Historical Provider, MD    traZODone (DESYREL) 25 mg TABS tablet Take 0.5 tablets (25 mg total) by mouth at bedtime as needed for sleep. 02/18/14  Yes Richarda Overlie, MD  vancomycin (VANCOCIN) 50 mg/mL oral solution Take 2.5 mLs (125 mg total) by mouth every 6 (six) hours. 03/03/14  Yes Joseph Art, DO  vitamin C (ASCORBIC ACID) 500 MG tablet Take 500 mg by mouth daily.   Yes Historical Provider, MD  aspirin EC 325 MG EC tablet Take 1 tablet (325 mg total) by mouth daily. Patient not taking: Reported on 05/07/2014 02/18/14   Richarda Overlie, MD  escitalopram (LEXAPRO) 10 MG tablet Take 1 tablet (10 mg total) by mouth daily. 02/18/14   Richarda Overlie, MD  morphine (MS CONTIN) 15 MG 12 hr tablet Take 1 tablet (15 mg total) by mouth every 12 (twelve) hours. Patient not taking: Reported on 05/07/2014 03/03/14   Joseph Art, DO  naltrexone (DEPADE) 50 MG tablet Take 0.5 tablets (25 mg total) by mouth daily. 02/18/14   Richarda Overlie, MD  ondansetron (ZOFRAN-ODT) 4 MG disintegrating tablet Take 4 mg by mouth every 6 (six) hours as needed for nausea.  04/09/14   Historical Provider, MD    Allergies:   Allergies  Allergen Reactions  . Tylenol [Acetaminophen] Nausea Only    Social History:  Ambulatory  wheelchair bound  Lives at home With family     reports that he has been smoking Cigarettes.  He has a 4 pack-year smoking history. His smokeless tobacco use includes Snuff and Chew. He reports that he drinks alcohol. He reports that he does not use illicit drugs.    Family History: family history includes Brain cancer in his mother; Heart disease in his paternal grandfather and paternal uncle; Heart failure in his paternal grandfather and paternal uncle; Pancreatic cancer in his father.    Physical Exam: Patient Vitals for the past 24 hrs:  BP Temp Temp src Pulse Resp SpO2  05/07/14 1845 130/90 mmHg 98 F (36.7 C) Oral (!) 147 (!) 21 99 %    1. General:  in No Acute distress 2. Psychological: Alert and    Oriented 3. Head/ENT:   Moist   Mucous Membranes                          Head Non traumatic, neck supple  Normal  Dentition 4. SKIN: normal   Skin turgor,  Skin clean Dry and intact no rash 5. Heart: irregular rate and rhythm, rapid no Murmur, Rub or gallop 6. Lungs: occasional  wheezes no crackles   7. Abdomen: Soft, non-tender, Non distended 8. Lower extremities: no clubbing, cyanosis, Left leg edema pitting 9. Neurologically Grossly intact, moving all 4 extremities equally 10. MSK:  range of motion  limited in left knee  body mass index is unknown because there is no weight on file.   Labs on Admission:   No results found for this or any previous visit (from the past 24 hour(s)).  UA oliguric  Lab Results  Component Value Date   HGBA1C 5.2 02/09/2014    CrCl cannot be calculated (Unknown ideal weight.).  BNP (last 3 results)  Recent Labs  02/09/14 1425  PROBNP >70000.0*    Other results:  I have pearsonaly reviewed this: At [facility] ECG REPORT  Rate: 54  Rhythm: Possible afebrile with RVR irregular, repeat eCG showing Aflutter with 2:1 AV conduction  Block  ST&T Change: No acute ischemic changes  Echogram in October 2015 showing EF of 50-65% with severe concentric hypertrophy  There were no vitals filed for this visit.   Cultures:    Component Value Date/Time   SDES BLOOD RIGHT FOREARM 02/09/2014 1546   SPECREQUEST BOTTLES DRAWN AEROBIC ONLY 5CC 02/09/2014 1546   CULT  02/09/2014 1546    STAPHYLOCOCCUS SPECIES (COAGULASE NEGATIVE) Note: THE SIGNIFICANCE OF ISOLATING THIS ORGANISM FROM A SINGLE SET OF BLOOD CULTURES WHEN MULTIPLE SETS ARE DRAWN IS UNCERTAIN. PLEASE NOTIFY THE MICROBIOLOGY DEPARTMENT WITHIN ONE WEEK IF SPECIATION AND SENSITIVITIES ARE REQUIRED. Note: Gram Stain Report Called to,Read Back By and Verified With: KENYETTA EASON 02/10/14 1515 BY SMITHERSJ Performed at [facility]   REPTSTATUS  02/11/2014 FINAL 02/09/2014 1546     Radiological Exams on Admission: No results found.  Chart has been reviewed  Assessment/Plan  53 year old gentleman with history of diastolic CHF, A.flutter, alcohol abuse, COPD with tobacco abuse, ESRD on HD T,H,S here as [facility] with Left Knee effusion worisome for infection vs inflamatory process and A.flutter with RVR  Present on Admission:  . Atrial flutter with rapid ventricular response - patient poorly responsive to Cardizem he reports not taking his Lopressor. We'll try to give additional IV doses of IV metoprolol 2.5 to see if this can be controlled. Discussed this with Dr. Tresa EndoKelly with cardiology if no improvement will need official cardiology consult. Likely cause of tachycardia medication noncompliance, pain possible underlying infection.  CHA2DS2 VASC score of 3. Given anemia will hemoccult stool if no evidence of chronic blood loss anticoagulation should be strongly considered unless patient has other contraindications.  ESRD on hemodialysis  - discuss case with nephrology patient will likely need dialysis tomorrow. Currently no acute indication for hemodialysis no evidence of severe fluid overload. Potassium per outside hospital labs was 3.9. We will need to repeat labs  . Chronic diastolic heart failure - currently does not appear to be fluid overloaded, patient is oliguric on dialysis will need early dialysis tomorrow since he hadn't been dialyzed since Saturday  . COPD (chronic obstructive pulmonary disease) - continue home medications change from albuterol to Xopenex given tachycardia  . HTN (hypertension) - continue Lopressor and Cardizem will convert to by mouth once rate controlled  . OSA (obstructive sleep apnea) - CPap at night  . Left knee pain - worrisome for inflammatory versus  infectious process. Spoke to Dr. Dion Saucier with orthopedics. We'll make patient nothing by mouth post midnight for possible knee washout  in the morning. Obtain sedimentation rate, CRP, INR, type and screen, plain imaging left knee. Given low extremity swelling obtain Dopplers to evaluate for DVT  . ETOH abuse - she currently denies history of alcohol abuse. But given recent drinking will put on CIWA protocol  C. Difficile colitis  - patient has not been compliant with medications will send C. difficile for stool and start vancomycin taper she continues to be symptomatic Anemia chronic likely due to anemia of chronic disease, will hemoccult stool, obtain anemia panel, given hg  Close to 7 and tachyarythmia as well as possible need for operative intervention in AM will slowly transfuse 1 unit  history of prolonged QTC - will hold on Zofran a.m. and stop trazodone and Lexapro Sepsis - given elevate WBC, tachycardia and tachypnea - left knee possible  source   Prophylaxis: SCD  , Pepcid  CODE STATUS:  FULL CODE    Other plan as per orders.  I have spent a total of 75 min on this admission extra time was taken to discuss this case with Dr. Tresa Endo of cardiology, Dr. Dion Saucier with orthopedics, and Dr. Eliott Nine with nephrology  [facility] 05/07/2014, 7:35 PM  [facility]  Pager [phone]   after 2 AM please page floor coverage PA If 7AM-7PM, please contact the day team taking care of the patient  [email or web address]  Password [email or web address]

## 2014-05-07 NOTE — Consult Note (Signed)
Called by [facility] to evaluate painful left knee.  Patient was evaluated at [facility] ([place] had knee aspirate, ortho consulted who apparently indicated the patient should follow-up with them as outpatient. Knee aspirate had 50,000 WBC per report.  Patient was then transferred to [facility], wheelchair bound because of the knee pain.  Discussed with Dr. Roel Cluck, and we are going to initiate a workup for sepsis, including xrays of the knee, ESR, CRP, and medical stabilization.  He will be NPO after midnight tonight, and possible arthroscopic knee washout tomorrow around 12 depending on the lab findings and xray findings.   I will see him early tomorrow morning once all the workup and data has been completed.    I have spoken with him over the phone and communicated the plan.   Johnny Bridge, MD

## 2014-05-08 ENCOUNTER — Inpatient Hospital Stay (HOSPITAL_COMMUNITY): Payer: Medicaid Other

## 2014-05-08 ENCOUNTER — Encounter (HOSPITAL_COMMUNITY): Payer: Self-pay | Admitting: Radiology

## 2014-05-08 ENCOUNTER — Encounter (HOSPITAL_COMMUNITY)
Admission: AD | Disposition: A | Discharge status: Skilled Nursing Facility | Payer: Self-pay | Source: Other Acute Inpatient Hospital | Attending: Internal Medicine

## 2014-05-08 ENCOUNTER — Inpatient Hospital Stay (HOSPITAL_COMMUNITY): Payer: Medicaid Other | Admitting: Anesthesiology

## 2014-05-08 DIAGNOSIS — M25462 Effusion, left knee: Secondary | ICD-10-CM | POA: Diagnosis present

## 2014-05-08 DIAGNOSIS — M009 Pyogenic arthritis, unspecified: Secondary | ICD-10-CM

## 2014-05-08 DIAGNOSIS — D649 Anemia, unspecified: Secondary | ICD-10-CM | POA: Diagnosis present

## 2014-05-08 DIAGNOSIS — R197 Diarrhea, unspecified: Secondary | ICD-10-CM | POA: Diagnosis present

## 2014-05-08 DIAGNOSIS — M7989 Other specified soft tissue disorders: Secondary | ICD-10-CM

## 2014-05-08 DIAGNOSIS — A419 Sepsis, unspecified organism: Secondary | ICD-10-CM | POA: Diagnosis present

## 2014-05-08 HISTORY — PX: KNEE ARTHROSCOPY: SHX127

## 2014-05-08 HISTORY — DX: Pyogenic arthritis, unspecified: M00.9

## 2014-05-08 LAB — CBC
HCT: 27.7 % — ABNORMAL LOW (ref 39.0–52.0)
HEMOGLOBIN: 8.8 g/dL — AB (ref 13.0–17.0)
MCH: 28.2 pg (ref 26.0–34.0)
MCHC: 31.8 g/dL (ref 30.0–36.0)
MCV: 88.8 fL (ref 78.0–100.0)
Platelets: 534 10*3/uL — ABNORMAL HIGH (ref 150–400)
RBC: 3.12 MIL/uL — ABNORMAL LOW (ref 4.22–5.81)
RDW: 16.6 % — ABNORMAL HIGH (ref 11.5–15.5)
WBC: 8.2 10*3/uL (ref 4.0–10.5)

## 2014-05-08 LAB — COMPREHENSIVE METABOLIC PANEL
ALT: 9 U/L (ref 0–53)
ANION GAP: 19 — AB (ref 5–15)
AST: 19 U/L (ref 0–37)
Albumin: 2.1 g/dL — ABNORMAL LOW (ref 3.5–5.2)
Alkaline Phosphatase: 135 U/L — ABNORMAL HIGH (ref 39–117)
BUN: 60 mg/dL — AB (ref 6–23)
CO2: 18 mmol/L — ABNORMAL LOW (ref 19–32)
CREATININE: 9.75 mg/dL — AB (ref 0.50–1.35)
Calcium: 8.1 mg/dL — ABNORMAL LOW (ref 8.4–10.5)
Chloride: 94 mEq/L — ABNORMAL LOW (ref 96–112)
GFR calc non Af Amer: 5 mL/min — ABNORMAL LOW (ref >90)
GFR, EST AFRICAN AMERICAN: 6 mL/min — AB (ref >90)
GLUCOSE: 172 mg/dL — AB (ref 70–99)
POTASSIUM: 4.5 mmol/L (ref 3.5–5.1)
Sodium: 131 mmol/L — ABNORMAL LOW (ref 135–145)
TOTAL PROTEIN: 6.1 g/dL (ref 6.0–8.3)
Total Bilirubin: 0.5 mg/dL (ref 0.3–1.2)

## 2014-05-08 LAB — VITAMIN B12: Vitamin B-12: 526 pg/mL (ref 211–911)

## 2014-05-08 LAB — FERRITIN: FERRITIN: 1234 ng/mL — AB (ref 22–322)

## 2014-05-08 LAB — TROPONIN I: TROPONIN I: 0.05 ng/mL — AB (ref <0.031)

## 2014-05-08 LAB — POCT I-STAT 4, (NA,K, GLUC, HGB,HCT)
Glucose, Bld: 112 mg/dL — ABNORMAL HIGH (ref 70–99)
HEMATOCRIT: 26 % — AB (ref 39.0–52.0)
Hemoglobin: 8.8 g/dL — ABNORMAL LOW (ref 13.0–17.0)
Potassium: 3.4 mmol/L — ABNORMAL LOW (ref 3.5–5.1)
SODIUM: 136 mmol/L (ref 135–145)

## 2014-05-08 LAB — HEMOGLOBIN A1C
HEMOGLOBIN A1C: 5.1 % (ref <5.7)
Mean Plasma Glucose: 100 mg/dL (ref <117)

## 2014-05-08 LAB — IRON AND TIBC
Iron: 40 ug/dL — ABNORMAL LOW (ref 42–165)
Saturation Ratios: 22 % (ref 20–55)
TIBC: 178 ug/dL — AB (ref 215–435)
UIBC: 138 ug/dL (ref 125–400)

## 2014-05-08 LAB — MAGNESIUM: MAGNESIUM: 2.2 mg/dL (ref 1.5–2.5)

## 2014-05-08 LAB — PREPARE RBC (CROSSMATCH)

## 2014-05-08 LAB — SYNOVIAL FLUID, CRYSTAL: Crystals, Fluid: NONE SEEN

## 2014-05-08 LAB — C-REACTIVE PROTEIN: CRP: 14.6 mg/dL — AB (ref <0.60)

## 2014-05-08 LAB — TSH: TSH: 0.612 u[IU]/mL (ref 0.350–4.500)

## 2014-05-08 LAB — PHOSPHORUS: PHOSPHORUS: 11.3 mg/dL — AB (ref 2.3–4.6)

## 2014-05-08 LAB — FOLATE: Folate: 18.3 ng/mL

## 2014-05-08 SURGERY — ARTHROSCOPY, KNEE
Anesthesia: General | Site: Knee | Laterality: Left

## 2014-05-08 MED ORDER — LIDOCAINE HCL (CARDIAC) 20 MG/ML IV SOLN
INTRAVENOUS | Status: DC | PRN
  Administered 2014-05-08: 80 mg via INTRAVENOUS

## 2014-05-08 MED ORDER — DEXTROSE 5 % IV SOLN
500.0000 mg | Freq: Four times a day (QID) | INTRAVENOUS | Status: DC | PRN
  Filled 2014-05-08: qty 5

## 2014-05-08 MED ORDER — SODIUM CHLORIDE 0.9 % IV SOLN: 100.0000 mL | INTRAVENOUS | Status: DC | PRN

## 2014-05-08 MED ORDER — METHOCARBAMOL 500 MG PO TABS
ORAL_TABLET | ORAL | Status: AC
  Filled 2014-05-08: qty 1

## 2014-05-08 MED ORDER — HYDROMORPHONE HCL 1 MG/ML IJ SOLN
0.2500 mg | INTRAMUSCULAR | Status: DC | PRN
  Administered 2014-05-08 (×4): 0.5 mg via INTRAVENOUS

## 2014-05-08 MED ORDER — PROPOFOL 10 MG/ML IV BOLUS
INTRAVENOUS | Status: DC | PRN
  Administered 2014-05-08: 200 mg via INTRAVENOUS

## 2014-05-08 MED ORDER — PROPOFOL 10 MG/ML IV BOLUS
INTRAVENOUS | Status: AC
  Filled 2014-05-08: qty 20

## 2014-05-08 MED ORDER — LIDOCAINE HCL (PF) 1 % IJ SOLN: 5.0000 mL | INTRAMUSCULAR | Status: DC | PRN

## 2014-05-08 MED ORDER — NEPRO/CARBSTEADY PO LIQD
237.0000 mL | ORAL | Status: DC | PRN
  Filled 2014-05-08: qty 237

## 2014-05-08 MED ORDER — MIDAZOLAM HCL 2 MG/2ML IJ SOLN
INTRAMUSCULAR | Status: AC
  Filled 2014-05-08: qty 2

## 2014-05-08 MED ORDER — DEXAMETHASONE SODIUM PHOSPHATE 4 MG/ML IJ SOLN
INTRAMUSCULAR | Status: DC | PRN
  Administered 2014-05-08: 8 mg via INTRAVENOUS

## 2014-05-08 MED ORDER — HEPARIN SODIUM (PORCINE) 1000 UNIT/ML DIALYSIS: 1000.0000 [IU] | INTRAMUSCULAR | Status: DC | PRN

## 2014-05-08 MED ORDER — MIDAZOLAM HCL 5 MG/5ML IJ SOLN
INTRAMUSCULAR | Status: DC | PRN
  Administered 2014-05-08: 2 mg via INTRAVENOUS

## 2014-05-08 MED ORDER — DIPHENHYDRAMINE HCL 25 MG PO CAPS
25.0000 mg | ORAL_CAPSULE | Freq: Once | ORAL | Status: AC
  Administered 2014-05-08: 25 mg via ORAL
  Filled 2014-05-08: qty 1

## 2014-05-08 MED ORDER — SENNA 8.6 MG PO TABS
1.0000 | ORAL_TABLET | Freq: Two times a day (BID) | ORAL | Status: DC
  Administered 2014-05-08 – 2014-05-14 (×11): 8.6 mg via ORAL
  Filled 2014-05-08 (×16): qty 1

## 2014-05-08 MED ORDER — ALTEPLASE 2 MG IJ SOLR
2.0000 mg | Freq: Once | INTRAMUSCULAR | Status: DC | PRN
  Filled 2014-05-08: qty 2

## 2014-05-08 MED ORDER — FENTANYL CITRATE 0.05 MG/ML IJ SOLN
INTRAMUSCULAR | Status: DC | PRN
  Administered 2014-05-08: 50 ug via INTRAVENOUS
  Administered 2014-05-08: 100 ug via INTRAVENOUS

## 2014-05-08 MED ORDER — METOCLOPRAMIDE HCL 5 MG/ML IJ SOLN
5.0000 mg | Freq: Three times a day (TID) | INTRAMUSCULAR | Status: DC | PRN
  Filled 2014-05-08: qty 2

## 2014-05-08 MED ORDER — SODIUM CHLORIDE 0.9 % IV SOLN
Freq: Once | INTRAVENOUS | Status: AC
  Administered 2014-05-08: 02:00:00 via INTRAVENOUS

## 2014-05-08 MED ORDER — HYDROMORPHONE HCL 1 MG/ML IJ SOLN
INTRAMUSCULAR | Status: AC
  Filled 2014-05-08: qty 1

## 2014-05-08 MED ORDER — ONDANSETRON HCL 4 MG/2ML IJ SOLN
4.0000 mg | Freq: Four times a day (QID) | INTRAMUSCULAR | Status: DC | PRN
Start: 2014-05-08 — End: 2014-05-14
  Administered 2014-05-08 – 2014-05-14 (×2): 4 mg via INTRAVENOUS
  Filled 2014-05-08: qty 2

## 2014-05-08 MED ORDER — DEXAMETHASONE SODIUM PHOSPHATE 4 MG/ML IJ SOLN
INTRAMUSCULAR | Status: AC
  Filled 2014-05-08: qty 2

## 2014-05-08 MED ORDER — VANCOMYCIN HCL IN DEXTROSE 1-5 GM/200ML-% IV SOLN
INTRAVENOUS | Status: AC
  Filled 2014-05-08: qty 200

## 2014-05-08 MED ORDER — POLYETHYLENE GLYCOL 3350 17 G PO PACK
17.0000 g | PACK | Freq: Every day | ORAL | Status: DC | PRN
  Filled 2014-05-08: qty 1

## 2014-05-08 MED ORDER — ONDANSETRON HCL 4 MG/2ML IJ SOLN
INTRAMUSCULAR | Status: DC | PRN
  Administered 2014-05-08: 4 mg via INTRAVENOUS

## 2014-05-08 MED ORDER — GADOBENATE DIMEGLUMINE 529 MG/ML IV SOLN: 10.0000 mL | Freq: Once | INTRAVENOUS | Status: AC | PRN

## 2014-05-08 MED ORDER — PENTAFLUOROPROP-TETRAFLUOROETH EX AERO: 1.0000 "application " | INHALATION_SPRAY | CUTANEOUS | Status: DC | PRN

## 2014-05-08 MED ORDER — ONDANSETRON HCL 4 MG/2ML IJ SOLN
INTRAMUSCULAR | Status: AC
  Filled 2014-05-08: qty 2

## 2014-05-08 MED ORDER — NEPRO/CARBSTEADY PO LIQD: 237.0000 mL | ORAL | Status: DC | PRN

## 2014-05-08 MED ORDER — METOCLOPRAMIDE HCL 5 MG PO TABS
5.0000 mg | ORAL_TABLET | Freq: Three times a day (TID) | ORAL | Status: DC | PRN
  Filled 2014-05-08: qty 2

## 2014-05-08 MED ORDER — OXYCODONE HCL 5 MG PO TABS
5.0000 mg | ORAL_TABLET | ORAL | Status: DC | PRN
  Administered 2014-05-08 – 2014-05-10 (×8): 10 mg via ORAL
  Administered 2014-05-11 (×2): 5 mg via ORAL
  Administered 2014-05-11 (×3): 10 mg via ORAL
  Administered 2014-05-12 (×2): 5 mg via ORAL
  Administered 2014-05-12: 10 mg via ORAL
  Administered 2014-05-13: 5 mg via ORAL
  Administered 2014-05-13 (×2): 10 mg via ORAL
  Administered 2014-05-13 (×2): 5 mg via ORAL
  Administered 2014-05-14 (×2): 10 mg via ORAL
  Filled 2014-05-08: qty 2
  Filled 2014-05-08: qty 1
  Filled 2014-05-08 (×2): qty 2
  Filled 2014-05-08: qty 1
  Filled 2014-05-08 (×3): qty 2
  Filled 2014-05-08: qty 1
  Filled 2014-05-08 (×12): qty 2

## 2014-05-08 MED ORDER — POTASSIUM CHLORIDE IN NACL 20-0.45 MEQ/L-% IV SOLN
INTRAVENOUS | Status: DC
  Administered 2014-05-08: 23:00:00 via INTRAVENOUS
  Filled 2014-05-08 (×2): qty 1000

## 2014-05-08 MED ORDER — LIDOCAINE-PRILOCAINE 2.5-2.5 % EX CREA
1.0000 "application " | TOPICAL_CREAM | CUTANEOUS | Status: DC | PRN
  Filled 2014-05-08: qty 5

## 2014-05-08 MED ORDER — CETYLPYRIDINIUM CHLORIDE 0.05 % MT LIQD
7.0000 mL | Freq: Two times a day (BID) | OROMUCOSAL | Status: DC
  Administered 2014-05-08 – 2014-05-14 (×12): 7 mL via OROMUCOSAL

## 2014-05-08 MED ORDER — VANCOMYCIN HCL 1000 MG IV SOLR
1000.0000 mg | INTRAVENOUS | Status: DC | PRN
  Administered 2014-05-08: 1000 mg via INTRAVENOUS

## 2014-05-08 MED ORDER — DOCUSATE SODIUM 100 MG PO CAPS
100.0000 mg | ORAL_CAPSULE | Freq: Two times a day (BID) | ORAL | Status: DC
  Administered 2014-05-08 – 2014-05-14 (×11): 100 mg via ORAL
  Filled 2014-05-08 (×13): qty 1

## 2014-05-08 MED ORDER — SODIUM CHLORIDE 0.9 % IV SOLN
INTRAVENOUS | Status: DC
  Administered 2014-05-08: 17:00:00 via INTRAVENOUS

## 2014-05-08 MED ORDER — ALTEPLASE 2 MG IJ SOLR: 2.0000 mg | Freq: Once | INTRAMUSCULAR | Status: DC | PRN

## 2014-05-08 MED ORDER — MORPHINE SULFATE 4 MG/ML IJ SOLN
4.0000 mg | INTRAMUSCULAR | Status: DC | PRN
Start: 2014-05-08 — End: 2014-05-08
  Administered 2014-05-08: 4 mg via INTRAVENOUS
  Filled 2014-05-08: qty 1

## 2014-05-08 MED ORDER — BISACODYL 10 MG RE SUPP: 10.0000 mg | Freq: Every day | RECTAL | Status: DC | PRN

## 2014-05-08 MED ORDER — METHOCARBAMOL 500 MG PO TABS
500.0000 mg | ORAL_TABLET | Freq: Four times a day (QID) | ORAL | Status: DC | PRN
  Administered 2014-05-08: 500 mg via ORAL
  Filled 2014-05-08: qty 1

## 2014-05-08 MED ORDER — ACETAMINOPHEN 325 MG PO TABS
650.0000 mg | ORAL_TABLET | Freq: Once | ORAL | Status: AC
  Administered 2014-05-08: 650 mg via ORAL
  Filled 2014-05-08: qty 2

## 2014-05-08 MED ORDER — HEPARIN SODIUM (PORCINE) 1000 UNIT/ML DIALYSIS: 2000.0000 [IU] | INTRAMUSCULAR | Status: DC | PRN

## 2014-05-08 MED ORDER — SODIUM CHLORIDE 0.9 % IV SOLN
INTRAVENOUS | Status: DC | PRN
  Administered 2014-05-08: 17:00:00 via INTRAVENOUS

## 2014-05-08 MED ORDER — DARBEPOETIN ALFA 150 MCG/0.3ML IJ SOSY
150.0000 ug | PREFILLED_SYRINGE | INTRAMUSCULAR | Status: DC
  Filled 2014-05-08: qty 0.3

## 2014-05-08 MED ORDER — HYDROMORPHONE HCL 1 MG/ML IJ SOLN
0.5000 mg | INTRAMUSCULAR | Status: DC | PRN
  Administered 2014-05-09 – 2014-05-10 (×5): 1 mg via INTRAVENOUS
  Filled 2014-05-08 (×5): qty 1

## 2014-05-08 MED ORDER — DIPHENHYDRAMINE HCL 12.5 MG/5ML PO ELIX
12.5000 mg | ORAL_SOLUTION | ORAL | Status: DC | PRN
  Filled 2014-05-08: qty 10

## 2014-05-08 MED ORDER — ONDANSETRON HCL 4 MG PO TABS
4.0000 mg | ORAL_TABLET | Freq: Four times a day (QID) | ORAL | Status: DC | PRN
  Administered 2014-05-13: 4 mg via ORAL
  Filled 2014-05-08: qty 1

## 2014-05-08 MED ORDER — FENTANYL CITRATE 0.05 MG/ML IJ SOLN
INTRAMUSCULAR | Status: AC
  Filled 2014-05-08: qty 5

## 2014-05-08 MED ORDER — LIDOCAINE-PRILOCAINE 2.5-2.5 % EX CREA: 1.0000 "application " | TOPICAL_CREAM | CUTANEOUS | Status: DC | PRN

## 2014-05-08 SURGICAL SUPPLY — 45 items
BANDAGE ELASTIC 6 VELCRO ST LF (GAUZE/BANDAGES/DRESSINGS) IMPLANT
BANDAGE ESMARK 6X9 LF (GAUZE/BANDAGES/DRESSINGS) IMPLANT
BLADE CUTTER GATOR 3.5 (BLADE) IMPLANT
BLADE SURG 11 STRL SS (BLADE) IMPLANT
BLADE SURG ROTATE 9660 (MISCELLANEOUS) IMPLANT
BNDG ESMARK 6X9 LF (GAUZE/BANDAGES/DRESSINGS)
COVER SURGICAL LIGHT HANDLE (MISCELLANEOUS) ×3 IMPLANT
CUFF TOURNIQUET SINGLE 34IN LL (TOURNIQUET CUFF) IMPLANT
CUTTER MENISCUS 3.5MM 6/BX (BLADE) IMPLANT
DRAPE ARTHROSCOPY W/POUCH 114 (DRAPES) ×3 IMPLANT
DRAPE U-SHAPE 47X51 STRL (DRAPES) ×3 IMPLANT
DRSG PAD ABDOMINAL 8X10 ST (GAUZE/BANDAGES/DRESSINGS) IMPLANT
GAUZE SPONGE 4X4 12PLY STRL (GAUZE/BANDAGES/DRESSINGS) IMPLANT
GAUZE XEROFORM 1X8 LF (GAUZE/BANDAGES/DRESSINGS) IMPLANT
GLOVE BIO SURGEON STRL SZ7.5 (GLOVE) ×3 IMPLANT
GLOVE BIOGEL PI IND STRL 8 (GLOVE) ×1 IMPLANT
GLOVE BIOGEL PI INDICATOR 8 (GLOVE) ×2
GLOVE BIOGEL PI ORTHO PRO SZ8 (GLOVE) ×2
GLOVE PI ORTHO PRO STRL SZ8 (GLOVE) ×1 IMPLANT
GLOVE SURG ORTHO 8.0 STRL STRW (GLOVE) ×6 IMPLANT
GOWN STRL REUS W/ TWL LRG LVL3 (GOWN DISPOSABLE) ×2 IMPLANT
GOWN STRL REUS W/ TWL XL LVL3 (GOWN DISPOSABLE) ×2 IMPLANT
GOWN STRL REUS W/TWL LRG LVL3 (GOWN DISPOSABLE) ×4
GOWN STRL REUS W/TWL XL LVL3 (GOWN DISPOSABLE) ×4
KIT ROOM TURNOVER OR (KITS) ×3 IMPLANT
MANIFOLD NEPTUNE II (INSTRUMENTS) ×3 IMPLANT
NEEDLE 18GX1X1/2 (RX/OR ONLY) (NEEDLE) IMPLANT
NEEDLE 22X1 1/2 (OR ONLY) (NEEDLE) ×3 IMPLANT
NEEDLE SPNL 18GX3.5 QUINCKE PK (NEEDLE) IMPLANT
NS IRRIG 1000ML POUR BTL (IV SOLUTION) IMPLANT
PACK ARTHROSCOPY DSU (CUSTOM PROCEDURE TRAY) ×3 IMPLANT
PAD ARMBOARD 7.5X6 YLW CONV (MISCELLANEOUS) IMPLANT
PADDING CAST COTTON 6X4 STRL (CAST SUPPLIES) IMPLANT
SET ARTHROSCOPY TUBING (MISCELLANEOUS) ×2
SET ARTHROSCOPY TUBING LN (MISCELLANEOUS) ×1 IMPLANT
SPONGE LAP 4X18 X RAY DECT (DISPOSABLE) ×3 IMPLANT
SUT MNCRL AB 4-0 PS2 18 (SUTURE) ×3 IMPLANT
SYR 20ML ECCENTRIC (SYRINGE) ×12 IMPLANT
SYR CONTROL 10ML LL (SYRINGE) IMPLANT
TOWEL OR 17X24 6PK STRL BLUE (TOWEL DISPOSABLE) ×3 IMPLANT
TOWEL OR 17X26 10 PK STRL BLUE (TOWEL DISPOSABLE) ×3 IMPLANT
TUBE CONNECTING 12'X1/4 (SUCTIONS) ×1
TUBE CONNECTING 12X1/4 (SUCTIONS) ×2 IMPLANT
WAND HAND CNTRL MULTIVAC 90 (MISCELLANEOUS) IMPLANT
WATER STERILE IRR 1000ML POUR (IV SOLUTION) ×3 IMPLANT

## 2014-05-08 NOTE — Procedures (Signed)
I was present at this dialysis session, have reviewed the session itself and made  appropriate changes  Rob Jamire Shabazz MD (pgr) [phone]    (c) [phone] 05/08/2014, 2:10 PM   

## 2014-05-08 NOTE — Progress Notes (Signed)
Pt declined CPAP tonight. °

## 2014-05-08 NOTE — Transfer of Care (Signed)
Immediate Anesthesia Transfer of Care Note  Patient: Albert BrasilDouglas E Cusic  Procedure(s) Performed: Procedure(s): ARTHROSCOPY KNEE I & D (Left)  Patient Location: PACU  Anesthesia Type:General  Level of Consciousness: awake, alert , oriented and patient cooperative  Airway & Oxygen Therapy: Patient Spontanous Breathing and Patient connected to nasal cannula oxygen  Post-op Assessment: Report given to PACU RN, Post -op Vital signs reviewed and stable and Patient moving all extremities X 4  Post vital signs: Reviewed and stable  Complications: No apparent anesthesia complications

## 2014-05-08 NOTE — Anesthesia Postprocedure Evaluation (Signed)
  Anesthesia Post-op Note  Patient: [name]  Procedure(s) Performed: Procedure(s): ARTHROSCOPY KNEE I & D (Left)  Patient Location: PACU  Anesthesia Type:General  Level of Consciousness: awake, alert , oriented and patient cooperative  Airway and Oxygen Therapy: Patient Spontanous Breathing  Post-op Pain: mild  Post-op Assessment: Post-op Vital signs reviewed, Patient's Cardiovascular Status Stable, Respiratory Function Stable, Patent Airway, No signs of Nausea or vomiting and Pain level controlled  Post-op Vital Signs: stable  Last Vitals:  Filed Vitals:   05/08/14 1634  BP: 138/77  Pulse: 95  Temp: 36.9 C  Resp: 25    Complications: No apparent anesthesia complications

## 2014-05-08 NOTE — Progress Notes (Addendum)
Discussed case with Dr. Romelle Starcher with radiology, no evidence for osteo of the bone, loculated fluid collections in the knee, not clear if sepsis or not.  Patient with only 20 degrees ROM of knee, recurrent effusions, with ESR of 90, unable to bear weight.  Very concerning for sepsis, with potential to develop osteomyelitis.    Plan for left knee scope washout I&D later tonight after HD.  Discussed with patient.  RBA reviewed, including difficulty of dealing with c-diff if he needs iv abx. Will take intraop cultures and hold abx for now.   NPO since 8a.  Johnny Bridge, MD

## 2014-05-08 NOTE — Consult Note (Signed)
Renal Service Consult Note [facility]  [name] 05/08/2014 Albert KrabbeSCHERTZ,[name] Requesting Physician:  Dr Sharon SellerMcClung  Reason for Consult:  ESRD pt with possible septic arthritis L knee HPI: The patient is a 53 y.o. year-old w hx of HTN, syst CHF, COPD, HL, OSA, etoh abuse, DJD, anx/depression and ESRD on HD in  on TTS schedule.  Presented to outside ED with 3 weeks L knee pain, had afib with RVR 150's.  Knee joint tap showed WBC 50k and [facility]  Prior hx Cdif.    Today pt has some SOB, says he was up to 71 kg when last weighed.  Thinks he has fluid on.  No severe dypsnea, CP or cough.  No abd pain, n/v/Massey, no HA, no confusion.   ROS  no HA  no rash  no sore throat  Past Medical History  Past Medical History  Diagnosis Date  . Hypertension   . Systolic heart failure     EF 02-72%25-30%  . Hyperlipidemia   . COPD (chronic obstructive pulmonary disease)   . Peripheral vascular disease   . Pneumonia 1980's  . OSA on CPAP   . ETOH abuse   . History of stomach ulcers   . Arthritis     "arms"  . Chronic lower back pain   . Anxiety   . Depression   . Adult ADHD     "never diagnosed; my son was; I think I've got it too"  . Chronic kidney disease (CKD), stage III (moderate)     baseline cr 2.3  . Renal cyst     left, complex  . Renal artery stenosis     a. s/p BMS to inferior branch of right renal artery 12/2011. On left, 3 renal arteries were noted, 2 of them were very small in size and subtotally occluded  . Anginal pain   . CHF (congestive heart failure)   . Seasonal allergies   . Hepatitis     "I think it was C"   Past Surgical History  Past Surgical History  Procedure Laterality Date  . Renal artery stent  01/05/2012    right inferior. t can be scanned safely under the following conditions: . Static magnetic field of 1.5 or 3 Tesla. Marland Kitchen. Spatial gradient field of 2500 Gauss/cm or less. . Maximum whole-body-averaged specific absorption rate  (SAR) of 2.0 W/kg (normal operating mode) for any duration of MRI scan that would otherwise be safe for the patient without implant.  . Av fistula placement Left 10/10/2012    Procedure: ARTERIOVENOUS (AV) FISTULA CREATION;  Surgeon: Sherren Kernsharles E Fields, MD;  [facility];  Service: Vascular;  Laterality: Left;  Brachio-cephalic  . Abdominal aortagram N/A 01/05/2012    Procedure: ABDOMINAL Ronny FlurryAORTAGRAM;  Surgeon: Iran OuchMuhammad A Arida, MD;  [facility];  Service: Cardiovascular;  Laterality: N/A;  . Renal angiogram N/A 01/05/2012    Procedure: RENAL ANGIOGRAM;  Surgeon: Iran OuchMuhammad A Arida, MD;  Location: [facility] CATH LAB;  Service: Cardiovascular;  Laterality: N/A;   Family History  Family History  Problem Relation Age of Onset  . Brain cancer Mother   . Pancreatic cancer Father   . Heart failure Paternal Uncle   . Heart disease Paternal Uncle   . Heart failure Paternal Grandfather   . Heart disease Paternal Grandfather    Social History  reports that he has been smoking Cigarettes.  He has a 4 pack-year smoking history. His smokeless tobacco use includes Snuff  and Chew. He reports that he drinks alcohol. He reports that he does not use illicit drugs. Allergies  Allergies  Allergen Reactions  . Tylenol [Acetaminophen] Nausea Only   Home medications Prior to Admission medications   Medication Sig Start Date End Date Taking? Authorizing Provider  albuterol (PROVENTIL HFA;VENTOLIN HFA) 108 (90 BASE) MCG/ACT inhaler Inhale 2 puffs into the lungs every 6 (six) hours as needed for wheezing or shortness of breath.   Yes Historical Provider, MD  ALPRAZolam Prudy Feeler) 0.5 MG tablet Take 1 tablet (0.5 mg total) by mouth 3 (three) times daily as needed for anxiety. 02/18/14  Yes Richarda Overlie, MD  budesonide-formoterol (SYMBICORT) 80-4.5 MCG/ACT inhaler Inhale 2 puffs into the lungs 2 (two) times daily. 01/17/14  Yes Zannie Cove, MD  calcium acetate (PHOSLO) 667 MG capsule Take 667 mg by mouth daily with  supper.   Yes Historical Provider, MD  calcium carbonate (TUMS EX) 750 MG chewable tablet Chew 1 tablet by mouth 2 (two) times daily as needed for heartburn.    Yes Historical Provider, MD  diltiazem (CARDIZEM CD) 180 MG 24 hr capsule Take 1 capsule (180 mg total) by mouth daily. 02/18/14  Yes Richarda Overlie, MD  diphenhydrAMINE (BENADRYL) 25 MG tablet Take 25 mg by mouth every 6 (six) hours as needed for itching.    Yes Historical Provider, MD  famotidine (PEPCID) 20 MG tablet Take 1 tablet (20 mg total) by mouth 2 (two) times daily. 02/18/14  Yes Richarda Overlie, MD  gabapentin (NEURONTIN) 100 MG capsule Take 2 capsules (200 mg total) by mouth 2 (two) times daily. 03/03/14  Yes Joseph Art, DO  metoprolol tartrate (LOPRESSOR) 25 MG tablet Take 1 tablet (25 mg total) by mouth 2 (two) times daily. 02/18/14  Yes Richarda Overlie, MD  metroNIDAZOLE (FLAGYL) 500 MG tablet Take 1 tablet (500 mg total) by mouth 3 (three) times daily. 02/18/14  Yes Richarda Overlie, MD  nicotine (NICODERM CQ - DOSED IN MG/24 HR) 7 mg/24hr patch Place 1 patch (7 mg total) onto the skin daily. 02/18/14  Yes Richarda Overlie, MD  oxyCODONE (OXY IR/ROXICODONE) 5 MG immediate release tablet Take 5 mg by mouth 2 (two) times daily as needed. 04/19/14  Yes Historical Provider, MD  roflumilast (DALIRESP) 500 MCG TABS tablet Take 500 mcg by mouth daily.   Yes Historical Provider, MD  thiamine 100 MG tablet Take 1 tablet (100 mg total) by mouth daily. 02/18/14  Yes Richarda Overlie, MD  tiotropium (SPIRIVA) 18 MCG inhalation capsule Place 18 mcg into inhaler and inhale daily.   Yes Historical Provider, MD  traZODone (DESYREL) 25 mg TABS tablet Take 0.5 tablets (25 mg total) by mouth at bedtime as needed for sleep. 02/18/14  Yes Richarda Overlie, MD  vancomycin (VANCOCIN) 50 mg/mL oral solution Take 2.5 mLs (125 mg total) by mouth every 6 (six) hours. 03/03/14  Yes Joseph Art, DO  vitamin C (ASCORBIC ACID) 500 MG tablet Take 500 mg by mouth daily.   Yes  Historical Provider, MD  aspirin EC 325 MG EC tablet Take 1 tablet (325 mg total) by mouth daily. Patient not taking: Reported on 05/07/2014 02/18/14   Richarda Overlie, MD  escitalopram (LEXAPRO) 10 MG tablet Take 1 tablet (10 mg total) by mouth daily. 02/18/14   Richarda Overlie, MD  morphine (MS CONTIN) 15 MG 12 hr tablet Take 1 tablet (15 mg total) by mouth every 12 (twelve) hours. Patient not taking: Reported on 05/07/2014 03/03/14   Selinda Orion  Vann, DO  naltrexone (DEPADE) 50 MG tablet Take 0.5 tablets (25 mg total) by mouth daily. 02/18/14   Richarda OverlieNayana Abrol, MD  ondansetron (ZOFRAN-ODT) 4 MG disintegrating tablet Take 4 mg by mouth every 6 (six) hours as needed for nausea.  04/09/14   Historical Provider, MD   Liver Function Tests  Recent Labs Lab 05/07/14 2130 05/08/14 0856  AST 13 19  ALT 9 9  ALKPHOS 122* 135*  BILITOT 0.3 0.5  PROT 5.5* 6.1  ALBUMIN 1.9* 2.1*   No results for input(s): LIPASE, AMYLASE in the last 168 hours. CBC  Recent Labs Lab 05/07/14 2130 05/08/14 0856  WBC 14.3* 8.2  NEUTROABS 14.0*  --   HGB 7.3* 8.8*  HCT 23.3* 27.7*  MCV 88.9 88.8  PLT 487* 534*   Basic Metabolic Panel  Recent Labs Lab 05/07/14 2130 05/08/14 0856  NA 132* 131*  K 4.0 4.5  CL 97 94*  CO2 19 18*  GLUCOSE 155* 172*  BUN 55* 60*  CREATININE 9.29* 9.75*  CALCIUM 8.0* 8.1*  PHOS  --  11.3*    Filed Vitals:   05/08/14 0530 05/08/14 0600 05/08/14 0724 05/08/14 0821  BP: 102/72 100/68 116/66   Pulse: 71 63 89   Temp:   97.4 F (36.3 C)   TempSrc:   Oral   Resp: 19 20 20    Height:      Weight:      SpO2: 100% 100% 100% 100%   Exam Awake, alert, no in distress, nasal O2 No rash, cyanosis or gangrene Sclera anicteric, throat clear No jvd or bruits Faint bibasilar rales, no wheezing, dec'Massey air movement throughout RRR transmitted bruit from AVF, no MRG Abd soft, NTND, no ascites or mass Left pretib edema 1+, swollen and tender L knee joint, no other swollen joints, no  RLE edema LUA AVF strong bruit Neuro is nf, Ox 3  HD: TTS [facility] 4h   64kg   400/A1.5   2/2.25 [place]   Heparin 5000   LUA AVF Aranesp 160 ug/wk, no other meds    Assessment: 1. L knee pain - r/o septic arthritis 2. ESRD on HD TTS 3. HTN/volume - vol overload, 6kg up , some SOB. Takes MTP and dilt at home 4. Afib with RVR 5. COPD/ tobacco 6. Hx etoh abuse 7. MBD cont binders 8. Anemia - Hb 8.8   Plan- HD asap today for vol excess / missed HD yesterday  Vinson Moselleob Ramia Sidney MD (pgr) [phone]    [phone] 05/08/2014, 11:42 AM

## 2014-05-08 NOTE — Progress Notes (Addendum)
[facility]  [name] ZOX:[id number] DOB: 08-27-1960 DOA: 05/07/2014 PCP: Wilmer Floor., MD  Brief narrative: 48 old male patient with multiple medical problems including chronic kidney disease on dialysis, systolic congestive heart failure, sleep apnea on CPAP, and chronic low back pain who reported for at least 3 weeks progressive left knee pain and swelling. He initially presented to [facility] where he underwent arthrocentesis in the emergency department with no growth on the cultures and no crystals. Symptoms apparently improved after that procedure was done but 2 days prior to presenting to this hospital he again developed pain and swelling of the left knee. In the emergency department at [facility] he was in atrial fibrillation with RVR with ventricular rates into the 150s. He reported he had run out of his Lopressor and had not taken this for several days. He had a repeat arthrocentesis done as well with 50,457 white cells but no bacteria. No fevers no chills. Orthopedic service at outside ER evaluated the patient and felt symptoms were inflammatory in nature. Patient otherwise was to follow-up with orthopedics physician as an outpatient. Because of his ongoing atrial flutter with RVR and need for dialysis patient was transferred to [facility]. In addition to the above history patient has a history C. difficile colitis and has been on Flagyl which he takes inconsistently. He was also supposed to be on oral vancomycin but unsure why he ran out. He reported continued issues with diarrhea. Patient reports has not been to dialysis in several days.  HPI/Subjective: Alert, reporting significant pain left knee. Initially denied issues with shortness of breath but was noted to be tachypnea and having increased work of breathing.   Assessment/Plan:  Possible septic arthritis / effusion left knee -Appreciate assistance of orthopedic team  - MRI of knee obtained and revealed complex knee joint effusion and Baker's cyst consistent with synovitis but no evidence of osteomyelitis; likely to undergo arthroscopy later today - in addition there were multiple bone infarcts involving the distal femur and proximal tibia with subchondral collapse of the lateral femoral condyle anteriorly with associated chondral irregularity; will defer to orthopedic team significance of these findings - continue empiric antibiotics - attempt to follow-up on cultures obtained at [facility] during arthrocentesis; likely repeat cultures will be obtained in the OR - cont IV and oral pain medications  Acute respiratory failure with hypoxia / COPD / OSA -Multifactorial: Primarily suspect volume overload due to missed dialysis treatments - minimal wheezing and not compatible with COPD exacerbation - reevaluate after dialysis    CKD stage V requiring chronic dialysis -For dialysis today - usual days are Tuesday Thursday Saturday  Atrial fibrillation with RVR -Current rate controlled and maintaining sinus rhythm - suspect initial tachycardia related to medication nonadherence - currently on Cardizem and Lopressor  Diarrhea / history of inadequately treated C. difficile colitis due to noncompliance  -No further diarrhea since admission - C. difficile PCR pending  Peripheral vascular disease -Likely explains left femur infarcts (see above)  ETOH abuse  -cont CIWA  Chronic systolic heart failure -Primary mode of treatment hemodialysis - last echocardiogram in October 2015 with normal LV function and grade 1 diastolic dysfunction; at this juncture no indication to pursue repeat echo  Anemia -Hgb stable at around baseline of 8.8 to 9.4  DVT prophylaxis: SCDs Code Status: Full Family Communication: No family at bedside Disposition Plan/Expected LOS: Stepdown  Consultants:  Nephrology Orthopedics  Procedures:  None  Antibiotics: Oral vancomycin  12/29 >  Objective: Blood pressure 129/56, pulse 99, temperature 98.6 F (37 C), temperature source Oral, resp. rate 23, height 5\' 7"  (1.702 m), weight 153 lb (69.4 kg), SpO2 98 %.  Intake/Output Summary (Last 24 hours) at 05/08/14 1348 Last data filed at 05/08/14 0421  Gross per 24 hour  Intake 379.67 ml  Output      0 ml  Net 379.67 ml   Exam: (per NP visit) Gen: Moderate acute respiratory distress evidenced with tachypnea and increased work of breathing Chest: Clear to auscultation bilaterally with spiral taurine wheezes and crackles, 2 L Cardiac: Regular rate and rhythm, S1-S2, no rubs murmurs or gallops, no peripheral edema, no JVD Abdomen: Soft nontender nondistended without obvious hepatosplenomegaly, no ascites Extremities: Note moderate spongy and fluctuant left knee effusion with largest portion above the knee, warm to touch but no erythema; quite tender-no cyanosis or clubbing otherwise  Scheduled Meds:  Scheduled Meds: . antiseptic oral rinse  7 mL Mouth Rinse BID  . budesonide-formoterol  2 puff Inhalation BID  . [START ON 05/09/2014] darbepoetin (ARANESP) injection - DIALYSIS  150 mcg Intravenous Q Thu-HD  . diltiazem  180 mg Oral Daily  . escitalopram  10 mg Oral Daily  . folic acid  1 mg Oral Daily  . gabapentin  200 mg Oral BID  . guaiFENesin  600 mg Oral BID  . ipratropium  0.5 mg Nebulization Q6H  . LORazepam  0-4 mg Oral Q6H   Followed by  . [START ON 05/09/2014] LORazepam  0-4 mg Oral Q12H  . metoprolol tartrate  25 mg Oral BID  . multivitamin with minerals  1 tablet Oral Daily  . nicotine  7 mg Transdermal Daily  . roflumilast  500 mcg Oral Daily  . sodium chloride  3 mL Intravenous Q12H  . sodium chloride  3 mL Intravenous Q12H  . thiamine  100 mg Oral Daily   Or  . thiamine  100 mg Intravenous Daily  . vancomycin  125 mg Oral QID   Followed by  . [START ON 05/14/2014] vancomycin  125 mg Oral BID   Followed by  . [START ON 05/22/2014] vancomycin  125  mg Oral Daily   Followed by  . [START ON 05/29/2014] vancomycin  125 mg Oral QODAY   Followed by  . [START ON 06/06/2014] vancomycin  125 mg Oral Q3 days   Data Reviewed: Basic Metabolic Panel:  Recent Labs Lab 05/07/14 2130 05/08/14 0856  NA 132* 131*  K 4.0 4.5  CL 97 94*  CO2 19 18*  GLUCOSE 155* 172*  BUN 55* 60*  CREATININE 9.29* 9.75*  CALCIUM 8.0* 8.1*  MG  --  2.2  PHOS  --  11.3*   Liver Function Tests:  Recent Labs Lab 05/07/14 2130 05/08/14 0856  AST 13 19  ALT 9 9  ALKPHOS 122* 135*  BILITOT 0.3 0.5  PROT 5.5* 6.1  ALBUMIN 1.9* 2.1*   CBC:  Recent Labs Lab 05/07/14 2130 05/08/14 0856  WBC 14.3* 8.2  NEUTROABS 14.0*  --   HGB 7.3* 8.8*  HCT 23.3* 27.7*  MCV 88.9 88.8  PLT 487* 534*   Cardiac Enzymes:  Recent Labs Lab 05/07/14 2130 05/08/14 0856  TROPONINI 0.04* 0.05*   BNP (last 3 results)  Recent Labs  02/09/14 1425  PROBNP >70000.0*     Recent Results (from the past 240 hour(s))  MRSA PCR Screening     Status: None  Collection Time: 05/07/14  6:41 PM  Result Value Ref Range Status   MRSA by PCR NEGATIVE NEGATIVE Final    Comment:        The GeneXpert MRSA Assay (FDA approved for NASAL specimens only), is one component of a comprehensive MRSA colonization surveillance program. It is not intended to diagnose MRSA infection nor to guide or monitor treatment for MRSA infections.      Studies:  Recent x-ray studies have been reviewed in detail by the Attending Physician  Time spent :  No charge  Junious Silkllison Ellis, ANP [facility] Office  [phone] Pager [phone]  On-Call/Text Page:      [email or web address]      password [email or web address]  If 7PM-7AM, please contact night-coverage [email or web address] Password [email or web address] 05/08/2014, 1:48 PM   LOS: 1 day   Multiple visits to room but pt not available for MD exam as he was in HD then taken directly to the OR.  Database reviewed.  Above note/plan reviewed in detail.  Lonia BloodJeffrey T.  McClung, MD [facility]

## 2014-05-08 NOTE — Progress Notes (Signed)
Pt stated that he did not want to wear a CPAP tonight (only occasional use when at home).

## 2014-05-08 NOTE — Consult Note (Signed)
ORTHOPAEDIC CONSULTATION  REQUESTING PHYSICIAN: Therisa Doyne, MD  Chief Complaint: Left leg pain  HPI: [name] is a 53 y.o. male who complains of  left leg pain that has been intermittent for the past couple of months. His been seen multiple medical facilities. He was transferred yesterday from [facility] and was told that he did not have an infection in his knee, they drained what sounds like between 50 and 100 mL of joint fluid from his knee. Today he says she cannot move the knee, has severe pain, cannot lift the knee, and this has been episodic in nature for the past couple of months. He also has reported some mild nausea with no vomiting.  He is on dialysis because of uncontrolled hypertension.  Past Medical History  Diagnosis Date  . Hypertension   . Systolic heart failure     EF 16-10%  . Hyperlipidemia   . COPD (chronic obstructive pulmonary disease)   . Peripheral vascular disease   . Pneumonia 1980's  . OSA on CPAP   . ETOH abuse   . History of stomach ulcers   . Arthritis     "arms"  . Chronic lower back pain   . Anxiety   . Depression   . Adult ADHD     "never diagnosed; my son was; I think I've got it too"  . Chronic kidney disease (CKD), stage III (moderate)     baseline cr 2.3  . Renal cyst     left, complex  . Renal artery stenosis     a. s/p BMS to inferior branch of right renal artery 12/2011. On left, 3 renal arteries were noted, 2 of them were very small in size and subtotally occluded  . Anginal pain   . CHF (congestive heart failure)   . Seasonal allergies   . Hepatitis     "I think it was C"   Past Surgical History  Procedure Laterality Date  . Renal artery stent  01/05/2012    right inferior  . Av fistula placement Left 10/10/2012    Procedure: ARTERIOVENOUS (AV) FISTULA CREATION;  Surgeon: Sherren Kerns, MD;  Location: [facility];  Service: Vascular;  Laterality: Left;  Brachio-cephalic  . Abdominal aortagram N/A 01/05/2012   Procedure: ABDOMINAL Ronny Flurry;  Surgeon: Iran Ouch, MD;  Location: [facility] CATH LAB;  Service: Cardiovascular;  Laterality: N/A;  . Renal angiogram N/A 01/05/2012    Procedure: RENAL ANGIOGRAM;  Surgeon: Iran Ouch, MD;  Location: [facility] CATH LAB;  Service: Cardiovascular;  Laterality: N/A;   History   Social History  . Marital Status: Widowed    Spouse Name: N/A    Number of Children: N/A  . Years of Education: N/A   Social History Main Topics  . Smoking status: Current Every Day Smoker -- 0.20 packs/day for 20 years    Types: Cigarettes    Last Attempt to Quit: 12/08/2012  . Smokeless tobacco: Current User    Types: Snuff, Chew  . Alcohol Use: Yes     Comment: Pt states he usually has 2 drinks per day. says cut down but last drink 05/06/2014  . Drug Use: No  . Sexual Activity: Not Currently   Other Topics Concern  . None   Social History Narrative   Family History  Problem Relation Age of Onset  . Brain cancer Mother   . Pancreatic cancer Father   . Heart failure Paternal Uncle   . Heart disease Paternal Uncle   .  Heart failure Paternal Grandfather   . Heart disease Paternal Grandfather    Allergies  Allergen Reactions  . Tylenol [Acetaminophen] Nausea Only   Prior to Admission medications   Medication Sig Start Date End Date Taking? Authorizing Provider  albuterol (PROVENTIL HFA;VENTOLIN HFA) 108 (90 BASE) MCG/ACT inhaler Inhale 2 puffs into the lungs every 6 (six) hours as needed for wheezing or shortness of breath.   Yes Historical Provider, MD  ALPRAZolam Prudy Feeler(XANAX) 0.5 MG tablet Take 1 tablet (0.5 mg total) by mouth 3 (three) times daily as needed for anxiety. 02/18/14  Yes Richarda OverlieNayana Abrol, MD  budesonide-formoterol (SYMBICORT) 80-4.5 MCG/ACT inhaler Inhale 2 puffs into the lungs 2 (two) times daily. 01/17/14  Yes Zannie CovePreetha Joseph, MD  calcium acetate (PHOSLO) 667 MG capsule Take 667 mg by mouth daily with supper.   Yes Historical Provider, MD  calcium carbonate (TUMS  EX) 750 MG chewable tablet Chew 1 tablet by mouth 2 (two) times daily as needed for heartburn.    Yes Historical Provider, MD  diltiazem (CARDIZEM CD) 180 MG 24 hr capsule Take 1 capsule (180 mg total) by mouth daily. 02/18/14  Yes Richarda OverlieNayana Abrol, MD  diphenhydrAMINE (BENADRYL) 25 MG tablet Take 25 mg by mouth every 6 (six) hours as needed for itching.    Yes Historical Provider, MD  famotidine (PEPCID) 20 MG tablet Take 1 tablet (20 mg total) by mouth 2 (two) times daily. 02/18/14  Yes Richarda OverlieNayana Abrol, MD  gabapentin (NEURONTIN) 100 MG capsule Take 2 capsules (200 mg total) by mouth 2 (two) times daily. 03/03/14  Yes Joseph ArtJessica U Vann, DO  metoprolol tartrate (LOPRESSOR) 25 MG tablet Take 1 tablet (25 mg total) by mouth 2 (two) times daily. 02/18/14  Yes Richarda OverlieNayana Abrol, MD  metroNIDAZOLE (FLAGYL) 500 MG tablet Take 1 tablet (500 mg total) by mouth 3 (three) times daily. 02/18/14  Yes Richarda OverlieNayana Abrol, MD  nicotine (NICODERM CQ - DOSED IN MG/24 HR) 7 mg/24hr patch Place 1 patch (7 mg total) onto the skin daily. 02/18/14  Yes Richarda OverlieNayana Abrol, MD  oxyCODONE (OXY IR/ROXICODONE) 5 MG immediate release tablet Take 5 mg by mouth 2 (two) times daily as needed. 04/19/14  Yes Historical Provider, MD  roflumilast (DALIRESP) 500 MCG TABS tablet Take 500 mcg by mouth daily.   Yes Historical Provider, MD  thiamine 100 MG tablet Take 1 tablet (100 mg total) by mouth daily. 02/18/14  Yes Richarda OverlieNayana Abrol, MD  tiotropium (SPIRIVA) 18 MCG inhalation capsule Place 18 mcg into inhaler and inhale daily.   Yes Historical Provider, MD  traZODone (DESYREL) 25 mg TABS tablet Take 0.5 tablets (25 mg total) by mouth at bedtime as needed for sleep. 02/18/14  Yes Richarda OverlieNayana Abrol, MD  vancomycin (VANCOCIN) 50 mg/mL oral solution Take 2.5 mLs (125 mg total) by mouth every 6 (six) hours. 03/03/14  Yes Joseph ArtJessica U Vann, DO  vitamin C (ASCORBIC ACID) 500 MG tablet Take 500 mg by mouth daily.   Yes Historical Provider, MD  aspirin EC 325 MG EC tablet Take 1  tablet (325 mg total) by mouth daily. Patient not taking: Reported on 05/07/2014 02/18/14   Richarda OverlieNayana Abrol, MD  escitalopram (LEXAPRO) 10 MG tablet Take 1 tablet (10 mg total) by mouth daily. 02/18/14   Richarda OverlieNayana Abrol, MD  morphine (MS CONTIN) 15 MG 12 hr tablet Take 1 tablet (15 mg total) by mouth every 12 (twelve) hours. Patient not taking: Reported on 05/07/2014 03/03/14   Joseph ArtJessica U Vann, DO  naltrexone (DEPADE) 50 MG  tablet Take 0.5 tablets (25 mg total) by mouth daily. 02/18/14   Richarda OverlieNayana Abrol, MD  ondansetron (ZOFRAN-ODT) 4 MG disintegrating tablet Take 4 mg by mouth every 6 (six) hours as needed for nausea.  04/09/14   Historical Provider, MD   Dg Knee Complete 4 Views Left  05/08/2014   CLINICAL DATA:  53 year old male with left-sided knee pain. No known injury.  EXAM: LEFT KNEE - COMPLETE 4+ VIEW  COMPARISON:  No priors.  FINDINGS: In the lateral femoral condyle and supracondylar region there are irregular areas of sclerosis. The possibility of chondroid matrix is not excluded in these regions. The articular surface of the lateral femoral condyle appears intact without definite depression. There may be some overlying periosteal reaction. No displaced fracture, subluxation or dislocation.  IMPRESSION: 1. Large area of sclerosis in the lateral femoral condyle and supracondylar region. The overall appearance of this is favored to reflect a large bone infarct, however, the possibility of a chondroid lesion is not excluded, particularly in light of the potential periosteal reaction. Further characterization with nonemergent MRI of the knee with and without IV gadolinium in the near future is recommended.   Electronically Signed   By: Trudie Reedaniel  Entrikin M.D.   On: 05/08/2014 01:00    Positive ROS: All other systems have been reviewed and were otherwise negative with the exception of those mentioned in the HPI and as above.  Physical Exam: General: Alert, no acute distress Cardiovascular: No pedal edema,  mild edema below the Ace wrap that was around his left knee, but not overly significant. 1+ at most. Respiratory: No cyanosis, no use of accessory musculature GI: Abdomen is mildly distended, mild tenderness, difficult to appreciate hepatomegaly, but suspect present. Skin: He has a small skin break over the anterior knee, that has a scab, but there is no redness or erythema around the knee. Neurologic: Sensation intact distally Psychiatric: Patient is competent for consent with normal mood and affect Lymphatic: No axillary or cervical lymphadenopathy  MUSCULOSKELETAL: Left knee has range of motion from 30 to 50 at most, severe pain with range of motion, mild effusion, but it apparently is much better than it was before the arthrocentesis yesterday.  Sedimentation rate is 90, white blood cell count is 14 peripherally   Assessment: left knee pain, concerning for osteomyelitis and septic joint. End-stage renal disease on dialysis, history of C. difficile, multiple other medical problems as indicated above  Plan: This is a subacute probably chronic condition at this point. I'm very concerned about the appearance on the radiographs, this does not support arthritis, but rather the periosteal reaction is concerning for osteomyelitis. We were planning for surgical intervention today with a knee arthroscopy, however if he doesn't fact have osteomyelitis I'm not sure that knee arthroscopy is going to be adequate. I'm going to delay his surgery, get an MRI of his knee, and allow him to have breakfast this morning, but possibly surgery later this evening after the MRI has been completed. If in fact it shows osteomyelitis then he may need above-knee amputation. If however it does not show osteomyelitis, then we could consider arthroscopic washout. His knee is not red or warm, but his laboratory values and physical exam are very concerning.    Eulas PostLANDAU,Kedrick Mcnamee P, MD Cell [phone]   05/08/2014 7:43  AM

## 2014-05-08 NOTE — Op Note (Signed)
05/07/2014 - 05/08/2014  7:16 PM  PATIENT:  [name]    PRE-OPERATIVE DIAGNOSIS:  Left knee infection  POST-OPERATIVE DIAGNOSIS:  Left septic knee with severe chondral damage, loose chondral piece out of the lateral femoral condyle with fragmentation that measured about 1 x 1.5 cm at least, with medial and lateral meniscal tears.  PROCEDURE:  Left knee arthroscopy with irrigation and debridement and lavage for infection with extensive debridement, chondroplasty of the lateral femoral condyle, removal of loose body, medial and lateral meniscal debridement.  SURGEON:  Eulas PostLANDAU,Akram Kissick P, MD  PHYSICIAN ASSISTANT: Janace LittenBrandon Parry, OPA-C, present and scrubbed throughout the case, critical for completion in a timely fashion, and for retraction, instrumentation, and closure.  ANESTHESIA:   General  PREOPERATIVE INDICATIONS:  [name] is a  53 y.o. male who had recurrent left knee effusion with a sedimentation rate of 90, and a aspiration of the knee that demonstrated 50,000 white blood cells, with progressive severe knee knee pain to the point where he was wheelchair bound, and elected for surgical debridement.  The risks benefits and alternatives were discussed with the patient preoperatively including but not limited to the risks of infection, bleeding, nerve injury, cardiopulmonary complications, the need for revision surgery, among others, and the patient was willing to proceed.  OPERATIVE IMPLANTS: None except for a Hemovac drain  OPERATIVE FINDINGS: There was fairly substantial chondral damage particularly on the lateral femoral condyle with a large segment of cartilage tissue that measured at least 1 x 1.5 cm, if not larger which was completely fragmented loose cartilage and bone that likely communicated to the abnormal bony infarct more proximally. The knee fluid itself had a moderate amount of yellowish turbid fluid which I aspirated prior to beginning the procedure, yielding about  40 mL of fluid which was sent to the laboratory in 5 different specimens for analysis, Gram stain, culture and sensitivity. The medial articular surface was still in reasonably good condition although there was a medial meniscus tear that was fairly complex extending from the body around to the posterior horn, and I debrided this. The lateral meniscus also had complex tearing in the mid substance of the body and the anterior horn. The anterior cruciate ligament and PCL were intact. Lateral compartment had reasonably good articular cartilage on the tibia, although the lateral femoral condyle anteriorly was substantially deficient, and had a large loose piece which I removed.  OPERATIVE PROCEDURE: The patient is brought to the operating room and placed in the supine position. Gen. anesthesia was administered. IV antibiotics were held until after cultures were taken, and then we started him on vancomycin. The left lower extremity was prepped and draped in usual sterile fashion. A timeout was performed, and diagnostic arthroscopy was carried out the above-named findings. I used a spinal needle prior to making any incision and aspirated a total of between 5 and 10 mL's of fluid into a total of 5 different syringes for total of almost 50 mL of aspirate which was turbid and yellowish.  I then made 3 portals, and performed diagnostic arthroscopy with an outflow as well as an inflow and an arthroscopic instrument portal as well. I ran a total of about 15 L of fluid through the knee. Complete chondroplasty was performed of the patellofemoral joint, and all loose debris in the suprapatellar pouch as well as medial and lateral gutters were removed, and I used the arthroscopic shaver as well as the arthroscopic grasper and a pituitary grasper to remove a  very large loose chondral piece. I had to extend my medial portal in order to get the piece out.  This left a fairly gaping hole in the lateral femoral condyle anteriorly,  which articulated with the patella on the lateral side. I also performed medial and lateral meniscectomies using an arthroscopic shaver, and debrided these back to stable configurations. After complete debridement was carried out and the loose debris removed I closed the portals with Monocryl followed by Steri-Strips and I also placed a deep Hemovac drain through the superolateral portal. Sterile dressings were applied. He was awakened and returned to the PACU in stable and satisfactory condition. There were no complications and he tolerated the procedure well.  It is not clear to me whether or not this was truly a septic joint, with the development of osteomyelitis, or simply some variant of osteonecrosis with fragmentation of the femoral condyle. We will have to evaluate whether or not any of the cultures demonstrate infection, however its overall very concerning given his constellation of laboratory and physical exam findings.

## 2014-05-08 NOTE — Progress Notes (Signed)
Patient requesting something for pain. Dr. Sampson GoonFitzgerald aware no orders given.

## 2014-05-08 NOTE — Anesthesia Preprocedure Evaluation (Addendum)
Anesthesia Evaluation   Patient awake    Reviewed: reviewed documented beta blocker date and time   Airway Mallampati: II  TM Distance: >3 FB Neck ROM: Full    Dental  (+) Poor Dentition, Dental Advisory Given   Pulmonary sleep apnea , COPD COPD inhaler, Current Smoker,  breath sounds clear to auscultation        Cardiovascular hypertension, Pt. on medications and Pt. on home beta blockers + angina + Peripheral Vascular Disease and +CHF + dysrhythmias Atrial Fibrillation Rhythm:Irregular Rate:Normal     Neuro/Psych Anxiety Depression    GI/Hepatic (+) Hepatitis -, C  Endo/Other    Renal/GU Renal disease     Musculoskeletal  (+) Arthritis -, Osteoarthritis,    Abdominal   Peds  Hematology  (+) anemia ,   Anesthesia Other Findings   Reproductive/Obstetrics                            Anesthesia Physical Anesthesia Plan  ASA: III  Anesthesia Plan: General   Post-op Pain Management:    Induction: Intravenous  Airway Management Planned: LMA  Additional Equipment:   Intra-op Plan:   Post-operative Plan: Extubation in OR  Informed Consent: I have reviewed the patients History and Physical, chart, labs and discussed the procedure including the risks, benefits and alternatives for the proposed anesthesia with the patient or authorized representative who has indicated his/her understanding and acceptance.   Dental advisory given  Plan Discussed with: CRNA  Anesthesia Plan Comments:         Anesthesia Quick Evaluation

## 2014-05-09 ENCOUNTER — Encounter (HOSPITAL_COMMUNITY): Payer: Self-pay | Admitting: Orthopedic Surgery

## 2014-05-09 DIAGNOSIS — J438 Other emphysema: Secondary | ICD-10-CM

## 2014-05-09 DIAGNOSIS — I5022 Chronic systolic (congestive) heart failure: Secondary | ICD-10-CM

## 2014-05-09 DIAGNOSIS — M25462 Effusion, left knee: Secondary | ICD-10-CM

## 2014-05-09 DIAGNOSIS — I739 Peripheral vascular disease, unspecified: Secondary | ICD-10-CM

## 2014-05-09 DIAGNOSIS — M25562 Pain in left knee: Secondary | ICD-10-CM | POA: Insufficient documentation

## 2014-05-09 DIAGNOSIS — Z992 Dependence on renal dialysis: Secondary | ICD-10-CM

## 2014-05-09 DIAGNOSIS — N186 End stage renal disease: Secondary | ICD-10-CM | POA: Insufficient documentation

## 2014-05-09 DIAGNOSIS — J9601 Acute respiratory failure with hypoxia: Secondary | ICD-10-CM

## 2014-05-09 DIAGNOSIS — F1010 Alcohol abuse, uncomplicated: Secondary | ICD-10-CM

## 2014-05-09 LAB — TYPE AND SCREEN
ABO/RH(D): O POS
Antibody Screen: NEGATIVE
Unit division: 0

## 2014-05-09 LAB — COMPREHENSIVE METABOLIC PANEL
ALBUMIN: 2 g/dL — AB (ref 3.5–5.2)
ALT: 9 U/L (ref 0–53)
AST: 15 U/L (ref 0–37)
Alkaline Phosphatase: 112 U/L (ref 39–117)
Anion gap: 11 (ref 5–15)
BUN: 25 mg/dL — ABNORMAL HIGH (ref 6–23)
CHLORIDE: 98 meq/L (ref 96–112)
CO2: 28 mmol/L (ref 19–32)
CREATININE: 4.57 mg/dL — AB (ref 0.50–1.35)
Calcium: 7.6 mg/dL — ABNORMAL LOW (ref 8.4–10.5)
GFR calc Af Amer: 16 mL/min — ABNORMAL LOW (ref >90)
GFR, EST NON AFRICAN AMERICAN: 13 mL/min — AB (ref >90)
GLUCOSE: 160 mg/dL — AB (ref 70–99)
Potassium: 4.5 mmol/L (ref 3.5–5.1)
SODIUM: 137 mmol/L (ref 135–145)
TOTAL PROTEIN: 5.7 g/dL — AB (ref 6.0–8.3)
Total Bilirubin: 0.5 mg/dL (ref 0.3–1.2)

## 2014-05-09 LAB — CBC
HCT: 25.5 % — ABNORMAL LOW (ref 39.0–52.0)
Hemoglobin: 8 g/dL — ABNORMAL LOW (ref 13.0–17.0)
MCH: 28.5 pg (ref 26.0–34.0)
MCHC: 31.4 g/dL (ref 30.0–36.0)
MCV: 90.7 fL (ref 78.0–100.0)
Platelets: 452 10*3/uL — ABNORMAL HIGH (ref 150–400)
RBC: 2.81 MIL/uL — AB (ref 4.22–5.81)
RDW: 16.7 % — AB (ref 11.5–15.5)
WBC: 11.9 10*3/uL — AB (ref 4.0–10.5)

## 2014-05-09 MED ORDER — VANCOMYCIN HCL IN DEXTROSE 750-5 MG/150ML-% IV SOLN
750.0000 mg | INTRAVENOUS | Status: DC
  Administered 2014-05-11 – 2014-05-14 (×2): 750 mg via INTRAVENOUS
  Filled 2014-05-09 (×2): qty 150

## 2014-05-09 MED ORDER — DARBEPOETIN ALFA 200 MCG/0.4ML IJ SOSY
PREFILLED_SYRINGE | INTRAMUSCULAR | Status: AC
Start: 2014-05-09 — End: 2014-05-09
  Filled 2014-05-09: qty 0.4

## 2014-05-09 MED ORDER — HYDROMORPHONE HCL 1 MG/ML IJ SOLN
INTRAMUSCULAR | Status: AC
  Filled 2014-05-09: qty 1

## 2014-05-09 MED ORDER — VANCOMYCIN HCL 10 G IV SOLR
1500.0000 mg | INTRAVENOUS | Status: DC
  Filled 2014-05-09: qty 1500

## 2014-05-09 MED ORDER — DARBEPOETIN ALFA 200 MCG/0.4ML IJ SOSY
200.0000 ug | PREFILLED_SYRINGE | INTRAMUSCULAR | Status: DC
  Administered 2014-05-09: 200 ug via INTRAVENOUS

## 2014-05-09 MED ORDER — VANCOMYCIN HCL 10 G IV SOLR
1250.0000 mg | INTRAVENOUS | Status: AC
  Administered 2014-05-09: 1250 mg via INTRAVENOUS
  Filled 2014-05-09: qty 1250

## 2014-05-09 NOTE — Progress Notes (Signed)
[name] Progress Note  Assessment/Plan: 1. Left knee infection - s/p surgery Dr. Dion SaucierLandau 12/30 cultures pending; not on antibioitcs - Dr. Arlean HoppingSchertz to d/w hospitalist IV vanc per pharmacy ordered 2. ESRD - [facility] - HD today - next HD due Sunday per holiday schedule; was receiving IVF at 75 with KCl - K 4.5 today -STOPPED   3. Anemia - Hgb steadily declining at outpt center despite increase in Aranesp - Fe studies decreasing 22% down from 72% in November ferritin 1234 not surprising with inflammation - hold on IV Fe for now; give max ESA and transfuse prn - consider dosing IV Fe in week or so; ^ Aranesp to 200 4. Secondary hyperparathyroidism - no calcitriol - last iPTH 118 5. HTN/volume - ^ goal to 3.5 L today 6. Nutrition - very poor  7. Hx of C diff. - on po Vanc  Sheffield SliderMartha B Bergman, PA-C [facility] Beeper [phone] 05/09/2014,11:02 AM  LOS: 2 days   Pt seen, examined and agree w A/P as above.  [name] Albert Grumbine MD pager [phone]    cell [phone] 05/09/2014, 11:05 AM    Subjective:   A little sob - haven't had my breathing treatment; smokes about 2 cigarettes a day. Objective Filed Vitals:   05/09/14 0830 05/09/14 0900 05/09/14 1000 05/09/14 1030  BP: 142/87 148/95 144/105 149/93  Pulse: 90 92 89 87  Temp:      TempSrc:      Resp: 22 21 22 18   Height:      Weight:      SpO2:       Physical Exam General: breathing somewhat labored Heart: RRR Lungs: bilateral wheezes Abdomen: distended soft + BS Extremities: no right LE edema; left LE + edema; knee wrapped with Hemovac Dialysis Access:  Left upper AVF  Dialysis Orders: [facility] 4h 64kg 400/A1.5 2/2.25 [place] Heparin 5000 LUA AVF Aranesp 160 ug/wk, no other meds   Additional Objective Labs: Basic Metabolic Panel:  Recent Labs Lab 05/07/14 2130 05/08/14 0856 05/08/14 1733 05/09/14 0323  NA 132* 131* 136 137  K 4.0 4.5 3.4* 4.5  CL 97 94*  --  98  CO2 19 18*  --  28   GLUCOSE 155* 172* 112* 160*  BUN 55* 60*  --  25*  CREATININE 9.29* 9.75*  --  4.57*  CALCIUM 8.0* 8.1*  --  7.6*  PHOS  --  11.3*  --   --    Liver Function Tests:  Recent Labs Lab 05/07/14 2130 05/08/14 0856 05/09/14 0323  AST 13 19 15   ALT 9 9 9   ALKPHOS 122* 135* 112  BILITOT 0.3 0.5 0.5  PROT 5.5* 6.1 5.7*  ALBUMIN 1.9* 2.1* 2.0*   CBC:  Recent Labs Lab 05/07/14 2130 05/08/14 0856 05/08/14 1733 05/09/14 0323  WBC 14.3* 8.2  --  11.9*  NEUTROABS 14.0*  --   --   --   HGB 7.3* 8.8* 8.8* 8.0*  HCT 23.3* 27.7* 26.0* 25.5*  MCV 88.9 88.8  --  90.7  PLT 487* 534*  --  452*   Blood Culture    Component Value Date/Time   SDES SYNOVIAL FLUID KNEE LEFT 05/08/2014 1855   SDES SYNOVIAL FLUID KNEE LEFT 05/08/2014 1855   SPECREQUEST SPECIMEN E 05/08/2014 1855   SPECREQUEST SPECIMEN E 05/08/2014 1855   CULT PENDING 05/08/2014 1855   CULT NO GROWTH [facility]  05/08/2014 1855   REPTSTATUS PENDING 05/08/2014 1855  REPTSTATUS PENDING 05/08/2014 1855    Cardiac Enzymes:  Recent Labs Lab 05/07/14 2130 05/08/14 0856  TROPONINI 0.04* 0.05*  Iron Studies:   Recent Labs  05/08/14 0856  IRON 40*  TIBC 178*  FERRITIN 1234*    Medications: . sodium chloride 10 mL/hr at 05/08/14 1658   . antiseptic oral rinse  7 mL Mouth Rinse BID  . budesonide-formoterol  2 puff Inhalation BID  . Darbepoetin Alfa      . darbepoetin (ARANESP) injection - DIALYSIS  200 mcg Intravenous Q Thu-HD  . diltiazem  180 mg Oral Daily  . docusate sodium  100 mg Oral BID  . escitalopram  10 mg Oral Daily  . folic acid  1 mg Oral Daily  . gabapentin  200 mg Oral BID  . guaiFENesin  600 mg Oral BID  . HYDROmorphone      . ipratropium  0.5 mg Nebulization Q6H  . LORazepam  0-4 mg Oral Q6H   Followed by  . LORazepam  0-4 mg Oral Q12H  . metoprolol tartrate  25 mg Oral BID  . multivitamin with minerals  1 tablet Oral Daily  . nicotine  7 mg Transdermal Daily   . roflumilast  500 mcg Oral Daily  . senna  1 tablet Oral BID  . sodium chloride  3 mL Intravenous Q12H  . sodium chloride  3 mL Intravenous Q12H  . thiamine  100 mg Oral Daily  . vancomycin  125 mg Oral QID   Followed by  . [START ON 05/14/2014] vancomycin  125 mg Oral BID   Followed by  . [START ON 05/22/2014] vancomycin  125 mg Oral Daily   Followed by  . [START ON 05/29/2014] vancomycin  125 mg Oral QODAY   Followed by  . [START ON 06/06/2014] vancomycin  125 mg Oral Q3 days

## 2014-05-09 NOTE — Progress Notes (Signed)
Patient ID: [name], male   DOB: 08/13/1960, 53 y.o.   MRN: [id number]     Subjective:  Patient reports pain as mild to moderate.  Patient sitting in bed and in no acute distress.  Denies any CP or SOB  Objective:   VITALS:   Filed Vitals:   05/09/14 0108 05/09/14 0400 05/09/14 0753 05/09/14 0801  BP: 119/81 120/74 125/88 129/87  Pulse: 98 88 89 87  Temp:  97.8 F (36.6 C) 98.1 F (36.7 C)   TempSrc:  Oral Oral   Resp: 14 18 23 25   Height:      Weight:   66.2 kg (145 lb 15.1 oz)   SpO2: 100% 100% 100%     ABD soft Sensation intact distally Dorsiflexion/Plantar flexion intact Incision: dressing C/D/I and moderate drainage Homo-vac drain in place and active did not remove  Lab Results  Component Value Date   WBC 11.9* 05/09/2014   HGB 8.0* 05/09/2014   HCT 25.5* 05/09/2014   MCV 90.7 05/09/2014   PLT 452* 05/09/2014   BMET    Component Value Date/Time   NA 137 05/09/2014 0323   K 4.5 05/09/2014 0323   CL 98 05/09/2014 0323   CO2 28 05/09/2014 0323   GLUCOSE 160* 05/09/2014 0323   BUN 25* 05/09/2014 0323   CREATININE 4.57* 05/09/2014 0323   CALCIUM 7.6* 05/09/2014 0323   GFRNONAA 13* 05/09/2014 0323   GFRAA 16* 05/09/2014 0323     Assessment/Plan: 1 Day Post-Op   Principal Problem:   Septic joint of left knee joint Active Problems:   CKD (chronic kidney disease) stage V requiring chronic dialysis   COPD (chronic obstructive pulmonary disease)   Peripheral vascular disease   ETOH abuse   Chronic systolic heart failure   OSA (obstructive sleep apnea)   Acute respiratory failure with hypoxia   Atrial fibrillation with RVR   Sepsis   Anemia   Diarrhea   Effusion of left knee   Advance diet Up with therapy Continue plan per medicine Waiting on fluid cultures Will continue to monitor   [name], [name] 05/09/2014, 8:15 AM  Discussed and agree. Plan for removal of drain tomorrow.  Ok to wbat with PT.  Fragmentation of cartilage  concerning for sepsis, although cultures pending.  Would defer abx to primary team/ID, but seems like treating for presumptive joint is appropriate despite risks of c-dif.  Teryl LucyJoshua Lamiracle Chaidez, MD Cell [phone]

## 2014-05-09 NOTE — Progress Notes (Signed)
Pt left floor for dialysis

## 2014-05-09 NOTE — Progress Notes (Signed)
PT Cancellation Note  Patient Details Name: Albert BrasilDouglas E Massey MRN: [id number] DOB: 01/17/1961   Cancelled Treatment:    Reason Eval/Treat Not Completed: Patient at procedure or test/unavailable, patient at HD   Fabio AsaWerner, Malani Lees J 05/09/2014, 8:28 AM [name], PT DPT  [phone]

## 2014-05-09 NOTE — Progress Notes (Signed)
[name] - Stepdown / ICU Progress Note  [name] RUE:[id number] DOB: 11/25/1960 DOA: 05/07/2014 PCP: Wilmer FloorAMPBELL, STEPHEN D., MD  Brief narrative: [age] old WM PMHx multiple medical problems including chronic kidney disease on dialysis, systolic congestive heart failure, sleep apnea on CPAP, and chronic low back pain who reported for at least 3 weeks progressive left knee pain and swelling. He initially presented to [facility] where he underwent arthrocentesis in the emergency department with no growth on the cultures and no crystals. Symptoms apparently improved after that procedure was done but 2 days prior to presenting to this hospital he again developed pain and swelling of the left knee. In [facility] he was in atrial fibrillation with RVR with ventricular rates into the 150s. He reported he had run out of his Lopressor and had not taken this for several days. He had a repeat arthrocentesis done as well with 50,457 white cells but no bacteria. No fevers no chills. Orthopedic service at outside ER evaluated the patient and felt symptoms were inflammatory in nature. Patient otherwise was to follow-up with orthopedics physician as an outpatient. Because of his ongoing atrial flutter with RVR and need for dialysis patient was [facility]. In addition to the above history patient has a history C. difficile colitis and has been on Flagyl which he takes inconsistently. He was also supposed to be on oral vancomycin but unsure why he ran out. He reported continued issues with diarrhea. Patient reports has not been to dialysis in several days.  HPI/Subjective: Examined in dialysis, endorsing significant left knee pain postoperatively, denies shortness of breath or chest pain.  Assessment/Plan:  Septic arthritis and effusion left knee; status post arthroscopy with irrigation'debridement and lavage (12/30) -Appreciate assistance of  orthopedic team - MRI of knee obtained and revealed complex knee joint effusion and Baker's cyst consistent with synovitis but no evidence of osteomyelitis- in addition there were multiple bone infarcts involving the distal femur and proximal tibia with subchondral collapse of the lateral femoral condyle anteriorly with associated chondral irregularity; will defer to orthopedic team significance of these findings - continue empiric antibiotics -had [facility]; in addition preoperative blood cultures were obtained and intraoperative fluid cultures were obtained as well-resume IV vancomycin with pharmacy dosing, inadvertently discontinued at some point during the hospitalization (restart to be administered T/Th/Sat at HD)-continue medications for pain management  Acute respiratory failure with hypoxia / COPD / OSA -Multifactorial: Primarily suspect volume overload due to missed dialysis treatments - minimal wheezing and not compatible with COPD exacerbation - reevaluate after dialysis    CKD stage V requiring chronic dialysis(T/Th/Sat) -- usual days are Tuesday Thursday Saturday-extra dialysis preoperatively on 12/30 since had missed several treatments prior to admission with net ultrafiltration of 4000 mL  Atrial fibrillation with RVR -Current rate controlled and maintaining sinus rhythm - suspect initial tachycardia related to medication nonadherence - cont Cardizem and Lopressor  Diarrhea / history of inadequately treated C. difficile colitis due to noncompliance  -No further diarrhea since admission - C. difficile PCR pending (previous positive findings on 10/8 in 10/22 and patient has admitted to inconsistent dosing of prescribed Flagyl and oral vancomycin)  Peripheral vascular disease -Likely explains left femur infarcts (see above)  ETOH abuse  -cont CIWA  Chronic systolic heart failure -Primary mode of treatment hemodialysis - last echocardiogram in  October 2015 with normal LV function and grade Massey diastolic dysfunction; at this  juncture no indication to pursue repeat echo  Anemia -Hgb stable at around baseline of 8.8 to 9.4    DVT prophylaxis: SCDs Code Status: Full Family Communication: No family at bedside Disposition Plan/Expected LOS: Stepdown  Consultants:  Nephrology Orthopedics  Procedures: None  Antibiotics: IV vancomycin 12/29 at hemodialysis T/Th/Sat>  Objective: Blood pressure 144/105, pulse 89, temperature 98.Massey F (36.7 C), temperature source Oral, resp. rate 22, height 5\' 7"  (Massey.702 m), weight 145 lb 15.Massey oz (66.2 kg), SpO2 100 %.  Intake/Output Summary (Last 24 hours) at 05/09/14 1008 Last data filed at 05/08/14 2100  Gross per 24 hour  Intake    600 ml  Output   4030 ml  Net  -3430 ml   Exam: (per NP visit) Gen: Moderate acute respiratory distress evidenced with tachypnea and increased work of breathing Chest: Clear to auscultation bilaterally-decreased somewhat in bases, 2 L Cardiac: Regular rate and rhythm, S1-S2, no rubs murmurs or gallops, no peripheral edema, no JVD Abdomen: Soft nontender nondistended without obvious hepatosplenomegaly, no ascites Extremities: Bilateral drains to left knee, Ace wrap in place, very tender with minimal palpation  Scheduled Meds:  Scheduled Meds: . antiseptic oral rinse  7 mL Mouth Rinse BID  . budesonide-formoterol  2 puff Inhalation BID  . darbepoetin (ARANESP) injection - DIALYSIS  200 mcg Intravenous Q Thu-HD  . diltiazem  180 mg Oral Daily  . docusate sodium  100 mg Oral BID  . escitalopram  10 mg Oral Daily  . folic acid  Massey mg Oral Daily  . gabapentin  200 mg Oral BID  . guaiFENesin  600 mg Oral BID  . HYDROmorphone      . ipratropium  0.5 mg Nebulization Q6H  . LORazepam  0-4 mg Oral Q6H   Followed by  . LORazepam  0-4 mg Oral Q12H  . metoprolol tartrate  25 mg Oral BID  . multivitamin with minerals  Massey tablet Oral Daily  . nicotine  7 mg  Transdermal Daily  . roflumilast  500 mcg Oral Daily  . senna  Massey tablet Oral BID  . sodium chloride  3 mL Intravenous Q12H  . sodium chloride  3 mL Intravenous Q12H  . thiamine  100 mg Oral Daily  . vancomycin  125 mg Oral QID   Followed by  . [START ON Massey/09/2014] vancomycin  125 mg Oral BID   Followed by  . [START ON Massey/13/2016] vancomycin  125 mg Oral Daily   Followed by  . [START ON Massey/20/2016] vancomycin  125 mg Oral QODAY   Followed by  . [START ON Massey/28/2016] vancomycin  125 mg Oral Q3 days   Data Reviewed: Basic Metabolic Panel:  Recent Labs Lab 05/07/14 2130 05/08/14 0856 05/08/14 1733 05/09/14 0323  NA 132* 131* 136 137  K 4.0 4.5 3.4* 4.5  CL 97 94*  --  98  CO2 19 18*  --  28  GLUCOSE 155* 172* 112* 160*  BUN 55* 60*  --  25*  CREATININE 9.29* 9.75*  --  4.57*  CALCIUM 8.0* 8.Massey*  --  7.6*  MG  --  2.2  --   --   PHOS  --  11.3*  --   --    Liver Function Tests:  Recent Labs Lab 05/07/14 2130 05/08/14 0856 05/09/14 0323  AST 13 19 15   ALT 9 9 9   ALKPHOS 122* 135* 112  BILITOT 0.3 0.5 0.5  PROT 5.5* 6.Massey 5.7*  ALBUMIN Massey.9* 2.Massey* 2.0*  CBC:  Recent Labs Lab 05/07/14 2130 05/08/14 0856 05/08/14 1733 05/09/14 0323  WBC 14.3* 8.2  --  11.9*  NEUTROABS 14.0*  --   --   --   HGB 7.3* 8.8* 8.8* 8.0*  HCT 23.3* 27.7* 26.0* 25.5*  MCV 88.9 88.8  --  90.7  PLT 487* 534*  --  452*   Cardiac Enzymes:  Recent Labs Lab 05/07/14 2130 05/08/14 0856  TROPONINI 0.04* 0.05*   BNP (last 3 results)  Recent Labs  02/09/14 1425  PROBNP >70000.0*     Recent Results (from the past 240 hour(s))  MRSA PCR Screening     Status: None   Collection Time: 05/07/14  6:41 PM  Result Value Ref Range Status   MRSA by PCR NEGATIVE NEGATIVE Final    Comment:        The GeneXpert MRSA Assay (FDA approved for NASAL specimens only), is one component of a comprehensive MRSA colonization surveillance program. It is not intended to diagnose MRSA infection nor to  guide or monitor treatment for MRSA infections.   Culture, blood (routine x 2)     Status: None (Preliminary result)   Collection Time: 05/07/14  9:30 PM  Result Value Ref Range Status   Specimen Description BLOOD RIGHT ARM  Final   Special Requests BOTTLES DRAWN AEROBIC AND ANAEROBIC 10CC  Final   Culture   Final           BLOOD CULTURE RECEIVED NO GROWTH TO DATE CULTURE WILL BE HELD FOR 5 DAYS BEFORE ISSUING A FINAL NEGATIVE REPORT Performed at [facility]    Report Status PENDING  Incomplete  Culture, blood (routine x 2)     Status: None (Preliminary result)   Collection Time: 05/07/14  9:40 PM  Result Value Ref Range Status   Specimen Description BLOOD RIGHT HAND  Final   Special Requests BOTTLES DRAWN AEROBIC AND ANAEROBIC 5CC  Final   Culture   Final           BLOOD CULTURE RECEIVED NO GROWTH TO DATE CULTURE WILL BE HELD FOR 5 DAYS BEFORE ISSUING A FINAL NEGATIVE REPORT Performed at [facility]    Report Status PENDING  Incomplete  Anaerobic culture     Status: None (Preliminary result)   Collection Time: 05/08/14  6:38 PM  Result Value Ref Range Status   Specimen Description SYNOVIAL FLUID KNEE LEFT  Final   Special Requests SPECIMEN B  Final   Gram Stain   Final    MODERATE WBC PRESENT,BOTH PMN AND MONONUCLEAR NO ORGANISMS SEEN Performed at [facility]    Culture PENDING  Incomplete   Report Status PENDING  Incomplete  Body fluid culture     Status: None (Preliminary result)   Collection Time: 05/08/14  6:38 PM  Result Value Ref Range Status   Specimen Description SYNOVIAL FLUID KNEE LEFT  Final   Special Requests SPECIMEN B  Final   Gram Stain   Final    MODERATE WBC PRESENT,BOTH PMN AND MONONUCLEAR NO ORGANISMS SEEN Performed at [facility]    Culture NO GROWTH Performed at [facility]   Final   Report Status PENDING  Incomplete  Anaerobic culture     Status: None (Preliminary result)   Collection Time: 05/08/14   6:42 PM  Result Value Ref Range Status   Specimen Description SYNOVIAL KNEE LEFT  Final   Special Requests SPECIMEN C  Final   Gram Stain  Final    MODERATE WBC PRESENT,BOTH PMN AND MONONUCLEAR NO ORGANISMS SEEN Performed at [facility]    Culture PENDING  Incomplete   Report Status PENDING  Incomplete  Body fluid culture     Status: None (Preliminary result)   Collection Time: 05/08/14  6:42 PM  Result Value Ref Range Status   Specimen Description SYNOVIAL KNEE LEFT  Final   Special Requests SPECIMEN C  Final   Gram Stain   Final    MODERATE WBC PRESENT,BOTH PMN AND MONONUCLEAR NO ORGANISMS SEEN Performed at [facility]    Culture PENDING  Incomplete   Report Status PENDING  Incomplete  Anaerobic culture     Status: None (Preliminary result)   Collection Time: 05/08/14  6:44 PM  Result Value Ref Range Status   Specimen Description SYNOVIAL FLUID KNEE LEFT  Final   Special Requests SPECIMEN D  Final   Gram Stain   Final    MODERATE WBC PRESENT,BOTH PMN AND MONONUCLEAR NO ORGANISMS SEEN Performed at [facility]    Culture PENDING  Incomplete   Report Status PENDING  Incomplete  Body fluid culture     Status: None (Preliminary result)   Collection Time: 05/08/14  6:44 PM  Result Value Ref Range Status   Specimen Description SYNOVIAL FLUID KNEE LEFT  Final   Special Requests SPECIMEN D  Final   Gram Stain   Final    MODERATE WBC PRESENT,BOTH PMN AND MONONUCLEAR NO ORGANISMS SEEN Performed at [facility]    Culture NO GROWTH Performed at [facility]   Final   Report Status PENDING  Incomplete  Anaerobic culture     Status: None (Preliminary result)   Collection Time: 05/08/14  6:55 PM  Result Value Ref Range Status   Specimen Description SYNOVIAL FLUID KNEE LEFT  Final   Special Requests SPECIMEN E  Final   Gram Stain   Final    MODERATE WBC PRESENT,BOTH PMN AND MONONUCLEAR NO ORGANISMS SEEN Performed at [facility]    Culture PENDING  Incomplete   Report Status PENDING  Incomplete  Body fluid culture     Status: None (Preliminary result)   Collection Time: 05/08/14  6:55 PM  Result Value Ref Range Status   Specimen Description SYNOVIAL FLUID KNEE LEFT  Final   Special Requests SPECIMEN E  Final   Gram Stain   Final    MODERATE WBC PRESENT,BOTH PMN AND MONONUCLEAR NO ORGANISMS SEEN Performed at [facility]    Culture NO GROWTH Performed at [facility]   Final   Report Status PENDING  Incomplete     Studies:  Recent x-ray studies have been reviewed in detail by the Attending Physician  Time spent :  No charge  Junious Silk, ANP [facility] Office  [phone] Pager [phone]  On-Call/Text Page:      [email or web address]      password [email or web address]  If 7PM-7AM, please contact night-coverage [email or web address] Password [email or web address] 05/09/2014, 10:08 AM   LOS: 2 days    Examined Patient and discussed A&P with ANP Revonda Standard and agree with above plan. I have reviewed the entire database. I have made any necessary editorial changes, and agree with its content. Pt with Multiple Complex medical problems> 40 min spent in direct Pt care  Carolyne Littles, MD [facility] [phone] pager

## 2014-05-09 NOTE — Progress Notes (Addendum)
ANTIBIOTIC CONSULT NOTE - INITIAL  Pharmacy Consult for Vancomycin Indication: Osteomyelitis  Allergies  Allergen Reactions  . Tylenol [Acetaminophen] Nausea Only    Patient Measurements: Height: 5\' 7"  (170.2 cm) Weight: 145 lb 15.1 oz (66.2 kg) IBW/kg (Calculated) : 66.1  Vital Signs: Temp: 97.7 F (36.5 C) (12/31 1232) Temp Source: Axillary (12/31 1232) BP: 156/104 mmHg (12/31 1232) Pulse Rate: 110 (12/31 1232) Intake/Output from previous day: 12/30 0701 - 12/31 0700 In: 600 [I.V.:400; IV Piggyback:200] Out: 4030 [Drains:30] Intake/Output from this shift: Total I/O In: 3 [I.V.:3] Out: 3017 [Other:3017]  Labs:  Recent Labs  05/07/14 2130 05/08/14 0856 05/08/14 1733 05/09/14 0323  WBC 14.3* 8.2  --  11.9*  HGB 7.3* 8.8* 8.8* 8.0*  PLT 487* 534*  --  452*  CREATININE 9.29* 9.75*  --  4.57*   Estimated Creatinine Clearance: 17.5 mL/min (by C-G formula based on Cr of 4.57). No results for input(s): VANCOTROUGH, VANCOPEAK, VANCORANDOM, GENTTROUGH, GENTPEAK, GENTRANDOM, TOBRATROUGH, TOBRAPEAK, TOBRARND, AMIKACINPEAK, AMIKACINTROU, AMIKACIN in the last 72 hours.   Microbiology: Recent Results (from the past 720 hour(s))  MRSA PCR Screening     Status: None   Collection Time: 05/07/14  6:41 PM  Result Value Ref Range Status   MRSA by PCR NEGATIVE NEGATIVE Final    Comment:        The GeneXpert MRSA Assay (FDA approved for NASAL specimens only), is one component of a comprehensive MRSA colonization surveillance program. It is not intended to diagnose MRSA infection nor to guide or monitor treatment for MRSA infections.   Culture, blood (routine x 2)     Status: None (Preliminary result)   Collection Time: 05/07/14  9:30 PM  Result Value Ref Range Status   Specimen Description BLOOD RIGHT ARM  Final   Special Requests BOTTLES DRAWN AEROBIC AND ANAEROBIC 10CC  Final   Culture   Final           BLOOD CULTURE RECEIVED NO GROWTH TO DATE CULTURE WILL BE HELD FOR  5 DAYS BEFORE ISSUING A FINAL NEGATIVE REPORT Performed at [facility]    Report Status PENDING  Incomplete  Culture, blood (routine x 2)     Status: None (Preliminary result)   Collection Time: 05/07/14  9:40 PM  Result Value Ref Range Status   Specimen Description BLOOD RIGHT HAND  Final   Special Requests BOTTLES DRAWN AEROBIC AND ANAEROBIC 5CC  Final   Culture   Final           BLOOD CULTURE RECEIVED NO GROWTH TO DATE CULTURE WILL BE HELD FOR 5 DAYS BEFORE ISSUING A FINAL NEGATIVE REPORT Performed at [facility]    Report Status PENDING  Incomplete  Anaerobic culture     Status: None (Preliminary result)   Collection Time: 05/08/14  6:38 PM  Result Value Ref Range Status   Specimen Description SYNOVIAL FLUID KNEE LEFT  Final   Special Requests SPECIMEN B  Final   Gram Stain   Final    MODERATE WBC PRESENT,BOTH PMN AND MONONUCLEAR NO ORGANISMS SEEN Performed at [facility]    Culture   Final    NO ANAEROBES ISOLATED; CULTURE IN PROGRESS FOR 5 DAYS Performed at [facility]    Report Status PENDING  Incomplete  Body fluid culture     Status: None (Preliminary result)   Collection Time: 05/08/14  6:38 PM  Result Value Ref Range Status   Specimen Description SYNOVIAL FLUID KNEE LEFT  Final   Special Requests SPECIMEN B  Final   Gram Stain   Final    MODERATE WBC PRESENT,BOTH PMN AND MONONUCLEAR NO ORGANISMS SEEN Performed at [facility]    Culture NO GROWTH Performed at [facility]   Final   Report Status PENDING  Incomplete  Anaerobic culture     Status: None (Preliminary result)   Collection Time: 05/08/14  6:42 PM  Result Value Ref Range Status   Specimen Description SYNOVIAL KNEE LEFT  Final   Special Requests SPECIMEN C  Final   Gram Stain   Final    MODERATE WBC PRESENT,BOTH PMN AND MONONUCLEAR NO ORGANISMS SEEN Performed at [facility]    Culture   Final    NO ANAEROBES ISOLATED; CULTURE IN  PROGRESS FOR 5 DAYS Performed at [facility]    Report Status PENDING  Incomplete  Body fluid culture     Status: None (Preliminary result)   Collection Time: 05/08/14  6:42 PM  Result Value Ref Range Status   Specimen Description SYNOVIAL KNEE LEFT  Final   Special Requests SPECIMEN C  Final   Gram Stain   Final    MODERATE WBC PRESENT,BOTH PMN AND MONONUCLEAR NO ORGANISMS SEEN Performed at [facility]    Culture PENDING  Incomplete   Report Status PENDING  Incomplete  Anaerobic culture     Status: None (Preliminary result)   Collection Time: 05/08/14  6:44 PM  Result Value Ref Range Status   Specimen Description SYNOVIAL FLUID KNEE LEFT  Final   Special Requests SPECIMEN D  Final   Gram Stain   Final    MODERATE WBC PRESENT,BOTH PMN AND MONONUCLEAR NO ORGANISMS SEEN Performed at [facility]    Culture   Final    NO ANAEROBES ISOLATED; CULTURE IN PROGRESS FOR 5 DAYS Performed at [facility]    Report Status PENDING  Incomplete  Body fluid culture     Status: None (Preliminary result)   Collection Time: 05/08/14  6:44 PM  Result Value Ref Range Status   Specimen Description SYNOVIAL FLUID KNEE LEFT  Final   Special Requests SPECIMEN D  Final   Gram Stain   Final    MODERATE WBC PRESENT,BOTH PMN AND MONONUCLEAR NO ORGANISMS SEEN Performed at [facility]    Culture NO GROWTH Performed at [facility]   Final   Report Status PENDING  Incomplete  Anaerobic culture     Status: None (Preliminary result)   Collection Time: 05/08/14  6:55 PM  Result Value Ref Range Status   Specimen Description SYNOVIAL FLUID KNEE LEFT  Final   Special Requests SPECIMEN E  Final   Gram Stain   Final    MODERATE WBC PRESENT,BOTH PMN AND MONONUCLEAR NO ORGANISMS SEEN Performed at [facility]    Culture   Final    NO ANAEROBES ISOLATED; CULTURE IN PROGRESS FOR 5 DAYS Performed at [facility]    Report Status PENDING   Incomplete  Body fluid culture     Status: None (Preliminary result)   Collection Time: 05/08/14  6:55 PM  Result Value Ref Range Status   Specimen Description SYNOVIAL FLUID KNEE LEFT  Final   Special Requests SPECIMEN E  Final   Gram Stain   Final    MODERATE WBC PRESENT,BOTH PMN AND MONONUCLEAR NO ORGANISMS SEEN Performed at [facility]    Culture NO GROWTH Performed at [facility]   Final   Report Status PENDING  Incomplete    Medical History: Past Medical History  Diagnosis Date  . Hypertension   . Systolic heart failure     EF 40-98%25-30%  . Hyperlipidemia   . COPD (chronic obstructive pulmonary disease)   . Peripheral vascular disease   . Pneumonia 1980's  . OSA on CPAP   . ETOH abuse   . History of stomach ulcers   . Arthritis     "arms"  . Chronic lower back pain   . Anxiety   . Depression   . Adult ADHD     "never diagnosed; my son was; I think I've got it too"  . Chronic kidney disease (CKD), stage III (moderate)     baseline cr 2.3  . Renal cyst     left, complex  . Renal artery stenosis     a. s/p BMS to inferior branch of right renal artery 12/2011. On left, 3 renal arteries were noted, 2 of them were very small in size and subtotally occluded  . Anginal pain   . CHF (congestive heart failure)   . Seasonal allergies   . Hepatitis     "I think it was C"  . Septic joint of left knee joint 05/08/2014    Medications:  Prescriptions prior to admission  Medication Sig Dispense Refill Last Dose  . albuterol (PROVENTIL HFA;VENTOLIN HFA) 108 (90 BASE) MCG/ACT inhaler Inhale 2 puffs into the lungs every 6 (six) hours as needed for wheezing or shortness of breath.   05/06/2014 at Unknown time  . ALPRAZolam (XANAX) 0.5 MG tablet Take 1 tablet (0.5 mg total) by mouth 3 (three) times daily as needed for anxiety. 20 tablet 0 05/07/2014 at Unknown time  . budesonide-formoterol (SYMBICORT) 80-4.5 MCG/ACT inhaler Inhale 2 puffs into the lungs 2 (two)  times daily. 1 Inhaler 1 05/06/2014 at Unknown time  . calcium acetate (PHOSLO) 667 MG capsule Take 667 mg by mouth daily with supper.   05/06/2014 at Unknown time  . calcium carbonate (TUMS EX) 750 MG chewable tablet Chew 1 tablet by mouth 2 (two) times daily as needed for heartburn.    05/06/2014 at Unknown time  . diltiazem (CARDIZEM CD) 180 MG 24 hr capsule Take 1 capsule (180 mg total) by mouth daily. 60 capsule 3 05/07/2014 at Unknown time  . diphenhydrAMINE (BENADRYL) 25 MG tablet Take 25 mg by mouth every 6 (six) hours as needed for itching.    Past Month at Unknown time  . famotidine (PEPCID) 20 MG tablet Take 1 tablet (20 mg total) by mouth 2 (two) times daily. 60 tablet 2 05/06/2014 at Unknown time  . gabapentin (NEURONTIN) 100 MG capsule Take 2 capsules (200 mg total) by mouth 2 (two) times daily. 120 capsule 0 05/06/2014 at Unknown time  . metoprolol tartrate (LOPRESSOR) 25 MG tablet Take 1 tablet (25 mg total) by mouth 2 (two) times daily. 60 tablet 2 05/07/2014 at 0900  . metroNIDAZOLE (FLAGYL) 500 MG tablet Take 1 tablet (500 mg total) by mouth 3 (three) times daily. 90 tablet 0 05/06/2014 at Unknown time  . nicotine (NICODERM CQ - DOSED IN MG/24 HR) 7 mg/24hr patch Place 1 patch (7 mg total) onto the skin daily. 28 patch 0 Past Week at Unknown time  . oxyCODONE (OXY IR/ROXICODONE) 5 MG immediate release tablet Take 5 mg by mouth 2 (two) times daily as needed.  0 05/07/2014 at Unknown time  . roflumilast (DALIRESP) 500  MCG TABS tablet Take 500 mcg by mouth daily.   05/06/2014 at Unknown time  . thiamine 100 MG tablet Take 1 tablet (100 mg total) by mouth daily. 30 tablet 3 05/06/2014 at Unknown time  . tiotropium (SPIRIVA) 18 MCG inhalation capsule Place 18 mcg into inhaler and inhale daily.   05/06/2014 at Unknown time  . traZODone (DESYREL) 25 mg TABS tablet Take 0.5 tablets (25 mg total) by mouth at bedtime as needed for sleep. 30 tablet 0 05/06/2014 at Unknown time  . vancomycin  (VANCOCIN) 50 mg/mL oral solution Take 2.5 mLs (125 mg total) by mouth every 6 (six) hours. 140 mL 0 05/06/2014 at Unknown time  . vitamin C (ASCORBIC ACID) 500 MG tablet Take 500 mg by mouth daily.   05/06/2014 at Unknown time  . aspirin EC 325 MG EC tablet Take 1 tablet (325 mg total) by mouth daily. (Patient not taking: Reported on 05/07/2014) 30 tablet 2 Not Taking at Unknown time  . escitalopram (LEXAPRO) 10 MG tablet Take 1 tablet (10 mg total) by mouth daily. 30 tablet 2 last couple of months  . morphine (MS CONTIN) 15 MG 12 hr tablet Take 1 tablet (15 mg total) by mouth every 12 (twelve) hours. (Patient not taking: Reported on 05/07/2014) 10 tablet 0 Not Taking at Unknown time  . naltrexone (DEPADE) 50 MG tablet Take 0.5 tablets (25 mg total) by mouth daily. 10 tablet 0 last couple of months  . ondansetron (ZOFRAN-ODT) 4 MG disintegrating tablet Take 4 mg by mouth every 6 (six) hours as needed for nausea.   0 05/04/2014   Assessment: 53 y.o. male presents with L leg pain - s/p joint fluid drainage at [facility] 12/29. Ortho following - Xray concerning for osteomyelitis. MRI 12/30 - no evidence of osteo. S/p I&D here 12/30 - per ortho note unsure if joint septic or some variant of osteonecrosis - awaiting cultures. Received 1gm IV vanc in OR ~1830 12/30. [facility] has ordered Vancomycin per pharmacy for septic L knee.  Pt also on po vancomycin taper for cdiff.  Pt ESRD (T/T/S) - received HD 12/30 (off schedule due to HD missed 12/29); pt also received HD today (BFR 400/4h).  Goal of Therapy:  Pre-HD Vanc level 15-25 mcg/ml  Plan:  Vancomycin 1250mg  IV now then 750mg  IV with HD Will f/u HD tolerance/schedule, pt's clinical condition, micro data Vanc trough prn  Christoper Fabian, PharmD, BCPS Clinical pharmacist, pager [phone] 05/09/2014,2:53 PM

## 2014-05-09 NOTE — Progress Notes (Signed)
PT Cancellation Note  Patient Details Name: Loretha BrasilDouglas E Childers MRN: [id number] DOB: 08/02/1960   Cancelled Treatment:    Reason Eval/Treat Not Completed: Patient at procedure or test/unavailable. Pt still at HD, will follow up as able.   Fabio AsaWerner, Erisha Paugh J 05/09/2014, 11:47 AM Charlotte Crumbevon Shaya Altamura, PT DPT  [phone]

## 2014-05-09 NOTE — Procedures (Signed)
I was present at this dialysis session, have reviewed the session itself and made  appropriate changes  Vinson Moselleob Laquasha Groome MD (pgr) [phone]    ([phone] 05/09/2014, 11:07 AM

## 2014-05-09 NOTE — Care Management Note (Addendum)
    Page 1 of 1   05/13/2014     4:05:42 PM CARE MANAGEMENT NOTE 05/13/2014  Patient:  [name],[name]   Account Number:  [id number]  Date Initiated:  05/09/2014  Documentation initiated by:  MAYO,HENRIETTA  Subjective/Objective Assessment:   dx Flutter w/RVR/septic arthritis; lives alone    PCP  Junious DresserStephen Campbell     Action/Plan:   Anticipated DC Date:  05/15/2014   Anticipated DC Plan:  SKILLED NURSING FACILITY  In-house referral  Clinical Social Worker      DC Planning Services  CM consult      Choice offered to / List presented to:             Status of service:  In process, will continue to follow Medicare Important Message given?  NO (If response is "NO", the following Medicare IM given date fields will be blank) Date Medicare IM given:   Medicare IM given by:   Date Additional Medicare IM given:   Additional Medicare IM given by:    Discharge Disposition:    Per UR Regulation:  Reviewed for med. necessity/level of care/duration of stay  If discussed at Long Length of Stay Meetings, dates discussed:    Comments:  05/13/13 Sidney AceJulie Sriya Kroeze, RN,  BSN [phone] PT recommending SNF with 24h supervision.  CSW following to facilitate dc to SNF when medically ready for dc.  Will follow progress.  05/09/14 1446 Henrietta Mayo RN MSN BSN CCM Pt states he uses [facility] for transportation, [facility] covers medications.  Questioned about missing antihypertensive, stated he had been out of town, called in refills, and when he picked them up, lopressor was not included as a new script was needed.  Notified PCP office and script was then called in.  States the process took several days.  S/P I&D (L) knee.  PT eval pending.

## 2014-05-09 NOTE — Evaluation (Signed)
Physical Therapy Evaluation Patient Details Name: [name] MRN: [id number] DOB: 01/15/1961 Today's Date: 05/09/2014   History of Present Illness  This 53 y.o. male [facility] with Lt. knee infection vs. inflammation, A-flutter with RVR and ESRD.  Pt underwent Lt knee arthroscopy with I&D and lavage for infection and extensive debridement, chondroplasty of the lateral femoral condyle, removal of loose body, medial and lateral meniscal debridement 12/30.  PMH:  HTN; systolic HF; hyperlipidemia, COPD, PVD; OSA uses CPAP; ETOH abuse; arhritis; chronic LBP; Depression, Hepatitis  Clinical Impression  Patient demonstrates deficits in functional mobility as indicated below. Will need continued skilled PT to address deficits and maximize function. Will see as indicated and progress as tolerated. Patient with increased pain this session but able to mobilize with RW. Encouraged continue mobility WITH staff for safety. Patient lives alone and will benefit from ST SNF prior to d/c home.    Follow Up Recommendations SNF;Supervision/Assistance - 24 hour    Equipment Recommendations  Rolling walker with 5" wheels    Recommendations for Other Services       Precautions / Restrictions Precautions Precautions: Fall Restrictions Weight Bearing Restrictions: Yes LLE Weight Bearing: Weight bearing as tolerated      Mobility  Bed Mobility Overal bed mobility: Needs Assistance Bed Mobility: Supine to Sit     Supine to sit: Min guard     General bed mobility comments: HOB elevated, patient used hands to support LLE and move it to the side of the bed, no physical assist required  Transfers Overall transfer level: Needs assistance Equipment used: Rolling walker (2 wheeled) Transfers: Sit to/from Stand Sit to Stand: Min assist (stabilize RW)         General transfer comment: VCs for safety and hand placement, patient impulsive with transfers and coming to standing.  min guard/assist for stability and safety  Ambulation/Gait Ambulation/Gait assistance: Min assist Ambulation Distance (Feet): 80 Feet Assistive device: Rolling walker (2 wheeled) Gait Pattern/deviations: Step-to pattern;Decreased weight shift to left;Antalgic;Narrow base of support Gait velocity: decreased Gait velocity interpretation: Below normal speed for age/gender General Gait Details: heavy relinace on RW, impulsive with mobility, min assist with progression to min guard. Decreased WBing through LLE   Stairs            Wheelchair Mobility    Modified Rankin (Stroke Patients Only)       Balance                                             Pertinent Vitals/Pain Pain Assessment: 0-10 Pain Score:  ("12") Pain Location: left knee Pain Intervention(s): Limited activity within patient's tolerance;Monitored during session;Premedicated before session;Repositioned;Relaxation    Home Living Family/patient expects to be discharged to:: Private residence Living Arrangements: Alone Available Help at Discharge: Family;Available PRN/intermittently Type of Home: Apartment Home Access: Stairs to enter Entrance Stairs-Rails: Right Entrance Stairs-Number of Steps: 6 Home Layout: One level Home Equipment:  Single [employer] Cane      Prior Function Level of Independence: Independent         Comments: Pt reports he ambulates with a cane, and reports frequent falls ~1/month.  He reports he does not drive and relies on friends/family to take him for groceries      Hand Dominance   Dominant Hand: Right    Extremity/Trunk Assessment  Lower Extremity Assessment: LLE deficits/detail         Communication   Communication: No difficulties  Cognition Arousal/Alertness: Awake/alert Behavior During Therapy: Impulsive Overall Cognitive Status: No family/caregiver present to determine baseline cognitive functioning                       General Comments      Exercises        Assessment/Plan    PT Assessment Patient needs continued PT services  PT Diagnosis Difficulty walking;Acute pain   PT Problem List Decreased strength;Decreased range of motion;Decreased activity tolerance;Decreased balance;Decreased mobility;Decreased safety awareness;Pain  PT Treatment Interventions DME instruction;Gait training;Stair training;Functional mobility training;Therapeutic activities;Therapeutic exercise;Balance training;Patient/family education   PT Goals (Current goals can be found in the Care Plan section) Acute Rehab PT Goals Patient Stated Goal: less pain PT Goal Formulation: With patient Time For Goal Achievement: 05/23/14 Potential to Achieve Goals: Good    Frequency Min 3X/week   Barriers to discharge Decreased caregiver support      Co-evaluation PT/OT/SLP Co-Evaluation/Treatment: Yes Reason for Co-Treatment: For patient/therapist safety PT goals addressed during session: Mobility/safety with mobility OT goals addressed during session: ADL's and self-care       End of Session   Activity Tolerance: Patient tolerated treatment well;Patient limited by pain Patient left: in chair;with call bell/phone within reach Nurse Communication: Mobility status         Time: [id number] PT Time Calculation (min) (ACUTE ONLY): 29 min   Charges:   PT Evaluation $Initial PT Evaluation Tier I: 1 Procedure PT Treatments $Gait Training: 8-22 mins   PT G CodesFabio Massey:        [name] 05/09/2014, 4:34 PM [name], PT DPT  [phone]

## 2014-05-09 NOTE — Evaluation (Signed)
Occupational Therapy Evaluation Patient Details Name: [name] MRN: [id number] DOB: 04/27/1961 Today'[name] Date: 05/09/2014    History of Present Illness This 53 y.o. male [facility] with Lt. knee infection vs. inflammation, A-flutter with RVR and ESRD.  Pt underwent Lt knee arthroscopy with I&D and lavage for infection and extensive debridement, chondroplasty of the lateral femoral condyle, removal of loose body, medial and lateral meniscal debridement 12/30.  PMH:  HTN; systolic HF; hyperlipidemia, COPD, PVD; OSA uses CPAP; ETOH abuse; arhritis; chronic LBP; Depression, Hepatitis   Clinical Impression   Pt admitted with above. He demonstrates the below listed deficits and will benefit from continued OT to maximize safety and independence with BADLs.  Pt presents to OT with generalized weakness and Lt. LE pain.  Currently, he requires min A for BADLs.  He lives alone.  Feel he will benefit from SNF level rehab at discharge.       Follow Up Recommendations  SNF    Equipment Recommendations  3 in 1 bedside comode    Recommendations for Other Services       Precautions / Restrictions Precautions Precautions: Fall Restrictions Weight Bearing Restrictions: Yes LLE Weight Bearing: Weight bearing as tolerated      Mobility Bed Mobility Overal bed mobility: Needs Assistance Bed Mobility: Supine to Sit     Supine to sit: Min guard     General bed mobility comments: HOB elevated, patient used hands to support LLE and move it to the side of the bed, no physical assist required  Transfers Overall transfer level: Needs assistance Equipment used: Rolling walker (2 wheeled) Transfers: Sit to/from Stand Sit to Stand: Min assist (stabilize RW)         General transfer comment: VCs for safety and hand placement, patient impulsive with transfers and coming to standing. min guard/assist for stability and safety    Balance Overall balance assessment: Needs  assistance Sitting-balance support: Feet supported Sitting balance-Leahy Scale: Good     Standing balance support: Bilateral upper extremity supported Standing balance-Leahy Scale: Poor Standing balance comment: reliant on UE support                             ADL Overall ADL's : Needs assistance/impaired Eating/Feeding: Independent   Grooming: Wash/dry hands;Wash/dry face;Oral care;Brushing hair;Min guard;Standing   Upper Body Bathing: Set up;Sitting   Lower Body Bathing: Minimal assistance;Sit to/from stand   Upper Body Dressing : Set up;Sitting   Lower Body Dressing: Minimal assistance;Sit to/from stand   Toilet Transfer: Minimal assistance;Ambulation;Comfort height toilet;BSC;Grab bars;RW   Toileting- ArchitectClothing Manipulation and Hygiene: Min guard;Sit to/from stand       Functional mobility during ADLs: Min guard;Minimal assistance;Rolling walker General ADL Comments: Pt requires encouragement for full participation     Vision                     Perception     Praxis      Pertinent Vitals/Pain Pain Assessment: 0-10 Pain Score:  ("12") Pain Location: left knee Pain Intervention(s): Limited activity within patient's tolerance;Monitored during session;Premedicated before session;Repositioned;Relaxation     Hand Dominance Right   Extremity/Trunk Assessment Upper Extremity Assessment Upper Extremity Assessment: Overall WFL for tasks assessed   Lower Extremity Assessment Lower Extremity Assessment: LLE deficits/detail LLE: Unable to fully assess due to pain       Communication Communication Communication: No difficulties   Cognition Arousal/Alertness: Awake/alert Behavior During Therapy:  Impulsive Overall Cognitive Status: No family/caregiver present to determine baseline cognitive functioning                     General Comments       Exercises       Shoulder Instructions      Home Living Family/patient expects to  be discharged to:: Private residence Living Arrangements: Alone Available Help at Discharge: Family;Available PRN/intermittently Type of Home: Apartment Home Access: Stairs to enter Entrance Stairs-Number of Steps: 6 Entrance Stairs-Rails: Right Home Layout: One level     Bathroom Shower/Tub: Tub/shower unit Shower/tub characteristics: Engineer, building servicesCurtain Bathroom Toilet: Standard Bathroom Accessibility: No   Home Equipment:  3in1commode           Prior Functioning/Environment Level of Independence: Independent        Comments: Pt reports he ambulates with a cane, and reports frequent falls ~1/month.  He reports he does not drive and relies on friends/family to take him for groceries     OT Diagnosis: Generalized weakness;Acute pain   OT Problem List: Decreased strength;Decreased activity tolerance;Impaired balance (sitting and/or standing);Decreased safety awareness;Decreased knowledge of use of DME or AE;Decreased knowledge of precautions;Pain   OT Treatment/Interventions: Self-care/ADL training;DME and/or AE instruction;Therapeutic activities;Patient/family education;Balance training    OT Goals(Current goals can be found in the care plan section) Acute Rehab OT Goals Patient Stated Goal: less pain OT Goal Formulation: With patient Time For Goal Achievement: 05/23/14 Potential to Achieve Goals: Good ADL Goals Pt Will Perform Grooming: with supervision;standing Pt Will Perform Lower Body Bathing: with supervision;sit to/from stand Pt Will Perform Lower Body Dressing: with supervision;sit to/from stand Pt Will Transfer to Toilet: with supervision;ambulating;regular height toilet;bedside commode;grab bars Pt Will Perform Toileting - Clothing Manipulation and hygiene: with supervision;sit to/from stand  OT Frequency: Min 2X/week   Barriers to D/C: Decreased caregiver support          Co-evaluation PT/OT/SLP Co-Evaluation/Treatment: Yes Reason for Co-Treatment: For  patient/therapist safety PT goals addressed during session: Mobility/safety with mobility OT goals addressed during session: ADL's and self-care      End of Session Equipment Utilized During Treatment: Rolling walker Nurse Communication: Mobility status;Patient requests pain meds  Activity Tolerance: Patient tolerated treatment well Patient left: in chair;with call bell/phone within reach   Time: 1452-1520 OT Time Calculation (min): 28 min Charges:  OT General Charges $OT Visit: 1 Procedure OT Evaluation $Initial OT Evaluation Tier I: 1 Procedure OT Treatments $Therapeutic Activity: 8-22 mins G-Codes:    Kaisy Severino M 05/09/2014, 4:40 PM

## 2014-05-10 DIAGNOSIS — R197 Diarrhea, unspecified: Secondary | ICD-10-CM

## 2014-05-10 DIAGNOSIS — G4733 Obstructive sleep apnea (adult) (pediatric): Secondary | ICD-10-CM

## 2014-05-10 LAB — RENAL FUNCTION PANEL
ANION GAP: 14 (ref 5–15)
Albumin: 2.1 g/dL — ABNORMAL LOW (ref 3.5–5.2)
BUN: 22 mg/dL (ref 6–23)
CHLORIDE: 95 meq/L — AB (ref 96–112)
CO2: 25 mmol/L (ref 19–32)
Calcium: 7.9 mg/dL — ABNORMAL LOW (ref 8.4–10.5)
Creatinine, Ser: 3.32 mg/dL — ABNORMAL HIGH (ref 0.50–1.35)
GFR calc Af Amer: 23 mL/min — ABNORMAL LOW (ref >90)
GFR calc non Af Amer: 20 mL/min — ABNORMAL LOW (ref >90)
Glucose, Bld: 103 mg/dL — ABNORMAL HIGH (ref 70–99)
Phosphorus: 4.2 mg/dL (ref 2.3–4.6)
Potassium: 3.6 mmol/L (ref 3.5–5.1)
Sodium: 134 mmol/L — ABNORMAL LOW (ref 135–145)

## 2014-05-10 LAB — CBC
HEMATOCRIT: 27.1 % — AB (ref 39.0–52.0)
Hemoglobin: 8.2 g/dL — ABNORMAL LOW (ref 13.0–17.0)
MCH: 28 pg (ref 26.0–34.0)
MCHC: 30.3 g/dL (ref 30.0–36.0)
MCV: 92.5 fL (ref 78.0–100.0)
Platelets: 458 10*3/uL — ABNORMAL HIGH (ref 150–400)
RBC: 2.93 MIL/uL — ABNORMAL LOW (ref 4.22–5.81)
RDW: 16.6 % — ABNORMAL HIGH (ref 11.5–15.5)
WBC: 16.4 10*3/uL — ABNORMAL HIGH (ref 4.0–10.5)

## 2014-05-10 MED ORDER — CALCIUM ACETATE 667 MG PO CAPS
667.0000 mg | ORAL_CAPSULE | Freq: Three times a day (TID) | ORAL | Status: DC
  Administered 2014-05-10 – 2014-05-14 (×12): 667 mg via ORAL
  Filled 2014-05-10 (×16): qty 1

## 2014-05-10 MED ORDER — MORPHINE SULFATE ER 15 MG PO TBCR
15.0000 mg | EXTENDED_RELEASE_TABLET | Freq: Two times a day (BID) | ORAL | Status: DC
  Administered 2014-05-10 – 2014-05-14 (×9): 15 mg via ORAL
  Filled 2014-05-10 (×9): qty 1

## 2014-05-10 MED ORDER — DIPHENHYDRAMINE HCL 25 MG PO CAPS
25.0000 mg | ORAL_CAPSULE | Freq: Once | ORAL | Status: AC
  Administered 2014-05-10: 25 mg via ORAL
  Filled 2014-05-10: qty 1

## 2014-05-10 NOTE — Progress Notes (Signed)
Orthopaedic Trauma Service Progress Note Cross Coverage  Subjective  Doing ok C/o L knee pain Transitioned to PO pain meds and states not as effective as IV  Review of Systems  Constitutional: Negative for fever and chills.  Respiratory: Negative for shortness of breath.   Cardiovascular: Negative for chest pain and palpitations.  Gastrointestinal: Negative for nausea and vomiting.  Musculoskeletal:       L knee pain   Neurological: Negative for headaches.     Objective   BP 145/81 mmHg  Pulse 105  Temp(Src) 97.9 F (36.6 C) (Oral)  Resp 22  Ht  (1.702 m)  Wt 66.2 kg (145 lb 15.1 oz)  BMI 22.85 kg/m2  SpO2 100%  Intake/Output      12/31 0701 - 01/01 0700 01/01 0701 - 01/02 0700   P.O. 120 120   I.V. (mL/kg) 6 (0.1)    Other 20    IV Piggyback 250    Total Intake(mL/kg) 396 (6) 120 (1.8)   Drains     Other 3017    Total Output 3017     Net -2621 +120          Labs  Exam  Gen: sitting in bedside chair watching TV  Ext:       Left Lower Extremity   Op sites stable  Distal motor and sensory functions grossly intact  Drain removed without difficulty  No erythema   Swelling controlled   + DP pulse   No DCT   Micro: pending but NGTD   Assessment and Plan   POD/HD#: 2  S/p L knee arthroscopy for septic L knee    Continue with current plan Drain removed from L knee Dressing changes as needed  WBAT ROM as tolerated Ice and elevate Abx based on C&S If cultures positive would likely get ID consult to assist with management given hx of c.diff   Mearl Latin, PA-C [facility] [phone] ([phone] (O) 05/10/2014 11:28 AM

## 2014-05-10 NOTE — Progress Notes (Signed)
[facility] TEAM 1 - Stepdown/ICU TEAM Progress Note  [name] VWU:[id number] DOB: 05-21-1960 DOA: 05/07/2014 PCP: Wilmer Floor., MD  Admit HPI / Brief Narrative: 83 old WM PMHx multiple medical problems including chronic kidney disease on dialysis, systolic congestive heart failure, sleep apnea on CPAP, and chronic low back pain who reported for at least 3 weeks progressive left knee pain and swelling. He initially presented to [facility] where he underwent arthrocentesis in the emergency department with no growth on the cultures and no crystals. Symptoms apparently improved after that procedure was done but 2 days prior to presenting to this hospital he again developed pain and swelling of the left knee. In the emergency department at [facility] he was in atrial fibrillation with RVR with ventricular rates into the 150s. He reported he had run out of his Lopressor and had not taken this for several days. He had a repeat arthrocentesis done as well with 50,457 white cells but no bacteria. No fevers no chills. Orthopedic service at outside ER evaluated the patient and felt symptoms were inflammatory in nature. Patient otherwise was to follow-up with orthopedics physician as an outpatient. Because of his ongoing atrial flutter with RVR and need for dialysis patient was transferred to [facility]. In addition to the above history patient has a history C. difficile colitis and has been on Flagyl which he takes inconsistently. He was also supposed to be on oral vancomycin but unsure why he ran out. He reported continued issues with diarrhea. Patient reports has not been to dialysis in several days.  HPI/Subjective: 1/1 A/O 4, states left knee pain 10/10, decrease SOB, negative CP  Assessment/Plan: Septic arthritis and effusion left knee; status post arthroscopy with irrigation'debridement and lavage (12/30) - MRI of left knee knee; showed infarcts and subchondral destruction see  results below.  -continue empiric vancomycin with HD   Acute respiratory failure with hypoxia / COPD / OSA -Multifactorial: Primarily suspect volume overload due to missed dialysis treatments - minimal wheezing  not compatible with COPD exacerbation, reevaluate after dialysis -CPAP per respiratory   CKD stage V requiring chronic dialysis(T/Th/Sat) - usual days are Tuesday Thursday Saturday -extra dialysis preoperatively on 12/30 since had missed several treatments prior to admission with net ultrafiltration of 4000 mL  Atrial fibrillation with RVR -Current rate controlled and maintaining sinus rhythm  - suspect initial tachycardia related to medication nonadherence  - cont Cardizem 180 mg daily  -Continue Lopressor 25 mg  BID  Diarrhea / history of inadequately treated C. difficile colitis due to noncompliance  -No further diarrhea since admission  - C. difficile PCR  (previous positive findings on 10/8 in 10/22 and patient has admitted to inconsistent dosing of prescribed Flagyl and oral vancomycin)  Peripheral vascular disease -Likely explains left femur infarcts (see above)  ETOH abuse  -cont CIWA  Chronic systolic heart failure -Primary mode of treatment hemodialysis  - last echocardiogram in October 2015 with normal LV function and grade 1 diastolic dysfunction; at this juncture no indication to pursue repeat echo  Anemia -Hgb stable at around baseline of 8.8 to 9.4     Code Status: FULL Family Communication: no family present at time of exam Disposition Plan: Per orthopedic surgery    Consultants: Dr. Delano Metz (Nephrology) Dr. Teryl Lucy (Orthopedics)  Procedure/Significant Events: 12/30 MRI left knee without contrast;- Large complex knee joint effusion and Baker's cyst consistent with synovitis. -No evidence of osteomyelitis. - Multiple bone infarcts involving the distal femur  and proximal tibia. - subchondral collapse lateral femoral  condyle anteriorly with associated chondral irregularity.- Intact menisci and ligaments. 12/30 Left knee arthroscopy with irrigation and debridement and lavage for infection with extensive debridement, chondroplasty of the lateral femoral condyle, removal of loose body, medial and lateral meniscal debridement.   Culture 12/29 MRSA by PCR negative 12/29 blood right arm/hand NGTD 12/30 left knee synovial fluid x 4 NGTD   Antibiotics: IV vancomycin 12/29 at hemodialysis T/Th/Sat>  DVT prophylaxis: SCD   Devices    LINES / TUBES:  12/30 left knee Hemovac    Continuous Infusions: . sodium chloride 10 mL/hr at 05/08/14 1658    Objective: VITAL SIGNS: Temp: 98.7 F (37.1 C) (01/01 0843) Temp Source: Oral (01/01 0843) BP: 145/96 mmHg (01/01 0843) Pulse Rate: 109 (01/01 0843) SPO2; FIO2:   Intake/Output Summary (Last 24 hours) at 05/10/14 0905 Last data filed at 05/10/14 0859  Gross per 24 hour  Intake    516 ml  Output   3017 ml  Net  -2501 ml     Exam: General: A/O 4, left knee pain rated 10/10, No acute respiratory distress Lungs: Diffuse wheezing, by basilar crackles  Cardiovascular: Tachycardic, Regular rhythm without murmur gallop or rub normal S1 and S2 Abdomen: Nontender, nondistended, soft, bowel sounds positive, no rebound, no ascites, no appreciable mass Extremities: Drains to left knee, Ace wrap in place, very tender with minimal palpation    Data Reviewed: Basic Metabolic Panel:  Recent Labs Lab 05/07/14 2130 05/08/14 0856 05/08/14 1733 05/09/14 0323 05/10/14 0313  NA 132* 131* 136 137 134*  K 4.0 4.5 3.4* 4.5 3.6  CL 97 94*  --  98 95*  CO2 19 18*  --  28 25  GLUCOSE 155* 172* 112* 160* 103*  BUN 55* 60*  --  25* 22  CREATININE 9.29* 9.75*  --  4.57* 3.32*  CALCIUM 8.0* 8.1*  --  7.6* 7.9*  MG  --  2.2  --   --   --   PHOS  --  11.3*  --   --  4.2   Liver Function Tests:  Recent Labs Lab 05/07/14 2130 05/08/14 0856  05/09/14 0323 05/10/14 0313  AST 13 19 15   --   ALT 9 9 9   --   ALKPHOS 122* 135* 112  --   BILITOT 0.3 0.5 0.5  --   PROT 5.5* 6.1 5.7*  --   ALBUMIN 1.9* 2.1* 2.0* 2.1*   No results for input(s): LIPASE, AMYLASE in the last 168 hours. No results for input(s): AMMONIA in the last 168 hours. CBC:  Recent Labs Lab 05/07/14 2130 05/08/14 0856 05/08/14 1733 05/09/14 0323 05/10/14 0313  WBC 14.3* 8.2  --  11.9* 16.4*  NEUTROABS 14.0*  --   --   --   --   HGB 7.3* 8.8* 8.8* 8.0* 8.2*  HCT 23.3* 27.7* 26.0* 25.5* 27.1*  MCV 88.9 88.8  --  90.7 92.5  PLT 487* 534*  --  452* 458*   Cardiac Enzymes:  Recent Labs Lab 05/07/14 2130 05/08/14 0856  TROPONINI 0.04* 0.05*   BNP (last 3 results)  Recent Labs  02/09/14 1425  PROBNP >70000.0*   CBG: No results for input(s): GLUCAP in the last 168 hours.  Recent Results (from the past 240 hour(s))  MRSA PCR Screening     Status: None   Collection Time: 05/07/14  6:41 PM  Result Value Ref Range Status   MRSA by PCR NEGATIVE  NEGATIVE Final    Comment:        The GeneXpert MRSA Assay (FDA approved for NASAL specimens only), is one component of a comprehensive MRSA colonization surveillance program. It is not intended to diagnose MRSA infection nor to guide or monitor treatment for MRSA infections.   Culture, blood (routine x 2)     Status: None (Preliminary result)   Collection Time: 05/07/14  9:30 PM  Result Value Ref Range Status   Specimen Description BLOOD RIGHT ARM  Final   Special Requests BOTTLES DRAWN AEROBIC AND ANAEROBIC 10CC  Final   Culture   Final           BLOOD CULTURE RECEIVED NO GROWTH TO DATE CULTURE WILL BE HELD FOR 5 DAYS BEFORE ISSUING A FINAL NEGATIVE REPORT Performed at [facility]    Report Status PENDING  Incomplete  Culture, blood (routine x 2)     Status: None (Preliminary result)   Collection Time: 05/07/14  9:40 PM  Result Value Ref Range Status   Specimen Description BLOOD  RIGHT HAND  Final   Special Requests BOTTLES DRAWN AEROBIC AND ANAEROBIC 5CC  Final   Culture   Final           BLOOD CULTURE RECEIVED NO GROWTH TO DATE CULTURE WILL BE HELD FOR 5 DAYS BEFORE ISSUING A FINAL NEGATIVE REPORT Performed at [facility]    Report Status PENDING  Incomplete  Anaerobic culture     Status: None (Preliminary result)   Collection Time: 05/08/14  6:38 PM  Result Value Ref Range Status   Specimen Description SYNOVIAL FLUID KNEE LEFT  Final   Special Requests SPECIMEN B  Final   Gram Stain   Final    MODERATE WBC PRESENT,BOTH PMN AND MONONUCLEAR NO ORGANISMS SEEN Performed at [facility]    Culture   Final    NO ANAEROBES ISOLATED; CULTURE IN PROGRESS FOR 5 DAYS Performed at [facility]    Report Status PENDING  Incomplete  Body fluid culture     Status: None (Preliminary result)   Collection Time: 05/08/14  6:38 PM  Result Value Ref Range Status   Specimen Description SYNOVIAL FLUID KNEE LEFT  Final   Special Requests SPECIMEN B  Final   Gram Stain   Final    MODERATE WBC PRESENT,BOTH PMN AND MONONUCLEAR NO ORGANISMS SEEN Performed at [facility]    Culture NO GROWTH Performed at [facility]   Final   Report Status PENDING  Incomplete  Anaerobic culture     Status: None (Preliminary result)   Collection Time: 05/08/14  6:42 PM  Result Value Ref Range Status   Specimen Description SYNOVIAL KNEE LEFT  Final   Special Requests SPECIMEN C  Final   Gram Stain   Final    MODERATE WBC PRESENT,BOTH PMN AND MONONUCLEAR NO ORGANISMS SEEN Performed at [facility]    Culture   Final    NO ANAEROBES ISOLATED; CULTURE IN PROGRESS FOR 5 DAYS Performed at [facility]    Report Status PENDING  Incomplete  Body fluid culture     Status: None (Preliminary result)   Collection Time: 05/08/14  6:42 PM  Result Value Ref Range Status   Specimen Description SYNOVIAL KNEE LEFT  Final   Special Requests  SPECIMEN C  Final   Gram Stain   Final    MODERATE WBC PRESENT,BOTH PMN AND MONONUCLEAR NO ORGANISMS SEEN Performed at [facility]    Culture PENDING  Incomplete   Report Status PENDING  Incomplete  Anaerobic culture     Status: None (Preliminary result)   Collection Time: 05/08/14  6:44 PM  Result Value Ref Range Status   Specimen Description SYNOVIAL FLUID KNEE LEFT  Final   Special Requests SPECIMEN D  Final   Gram Stain   Final    MODERATE WBC PRESENT,BOTH PMN AND MONONUCLEAR NO ORGANISMS SEEN Performed at [facility]    Culture   Final    NO ANAEROBES ISOLATED; CULTURE IN PROGRESS FOR 5 DAYS Performed at [facility]    Report Status PENDING  Incomplete  Body fluid culture     Status: None (Preliminary result)   Collection Time: 05/08/14  6:44 PM  Result Value Ref Range Status   Specimen Description SYNOVIAL FLUID KNEE LEFT  Final   Special Requests SPECIMEN D  Final   Gram Stain   Final    MODERATE WBC PRESENT,BOTH PMN AND MONONUCLEAR NO ORGANISMS SEEN Performed at [facility]    Culture NO GROWTH Performed at [facility]   Final   Report Status PENDING  Incomplete  Anaerobic culture     Status: None (Preliminary result)   Collection Time: 05/08/14  6:55 PM  Result Value Ref Range Status   Specimen Description SYNOVIAL FLUID KNEE LEFT  Final   Special Requests SPECIMEN E  Final   Gram Stain   Final    MODERATE WBC PRESENT,BOTH PMN AND MONONUCLEAR NO ORGANISMS SEEN Performed at [facility]    Culture   Final    NO ANAEROBES ISOLATED; CULTURE IN PROGRESS FOR 5 DAYS Performed at [facility]    Report Status PENDING  Incomplete  Body fluid culture     Status: None (Preliminary result)   Collection Time: 05/08/14  6:55 PM  Result Value Ref Range Status   Specimen Description SYNOVIAL FLUID KNEE LEFT  Final   Special Requests SPECIMEN E  Final   Gram Stain   Final    MODERATE WBC PRESENT,BOTH PMN  AND MONONUCLEAR NO ORGANISMS SEEN Performed at [facility]    Culture NO GROWTH Performed at [facility]   Final   Report Status PENDING  Incomplete     Studies:  Recent x-ray studies have been reviewed in detail by the Attending Physician  Scheduled Meds:  Scheduled Meds: . antiseptic oral rinse  7 mL Mouth Rinse BID  . budesonide-formoterol  2 puff Inhalation BID  . darbepoetin (ARANESP) injection - DIALYSIS  200 mcg Intravenous Q Thu-HD  . diltiazem  180 mg Oral Daily  . docusate sodium  100 mg Oral BID  . escitalopram  10 mg Oral Daily  . folic acid  1 mg Oral Daily  . gabapentin  200 mg Oral BID  . guaiFENesin  600 mg Oral BID  . ipratropium  0.5 mg Nebulization Q6H  . LORazepam  0-4 mg Oral Q12H  . metoprolol tartrate  25 mg Oral BID  . multivitamin with minerals  1 tablet Oral Daily  . nicotine  7 mg Transdermal Daily  . roflumilast  500 mcg Oral Daily  . senna  1 tablet Oral BID  . sodium chloride  3 mL Intravenous Q12H  . sodium chloride  3 mL Intravenous Q12H  . thiamine  100 mg Oral Daily  . vancomycin  125 mg Oral QID   Followed by  . [START ON 05/14/2014]  vancomycin  125 mg Oral BID   Followed by  . [START ON 05/22/2014] vancomycin  125 mg Oral Daily   Followed by  . [START ON 05/29/2014] vancomycin  125 mg Oral QODAY   Followed by  . [START ON 06/06/2014] vancomycin  125 mg Oral Q3 days  . [START ON 05/11/2014] vancomycin  750 mg Intravenous Q T,Th,Sa-HD    Time spent on care of this patient: 40 mins   Drema Dallas , MD   [facility] Office  [phone] Pager - [phone]  On-Call/Text Page:      [email or web address]      password [email or web address]  If 7PM-7AM, please contact night-coverage [email or web address] Password [email or web address] 05/10/2014, 9:05 AM   LOS: 3 days

## 2014-05-10 NOTE — Progress Notes (Signed)
Patient refused CPAP this evening.

## 2014-05-10 NOTE — Progress Notes (Signed)
[facility] Progress Note   Subjective: abd cramping better overall, L knee pain improving, on CIWA, no complaints  Filed Vitals:   05/10/14 0000 05/10/14 0200 05/10/14 0537 05/10/14 0843  BP: 154/90 152/91  145/96  Pulse: 89 88  109  Temp: 98.3 F (36.8 C)   98.7 F (37.1 C)  TempSrc: Oral   Oral  Resp: Height:      Weight:      SpO2: 100% 100% 98%    Exam: Alert, calm, no distress No jvd Chest clear mostly bilat , occ basilar rales RRR no MRG Abd soft, NTND, +BS 1+ LLE pretib edema, L knee wrapped w drain Neuro is nf, Ox 3 LUA AVF patent  HD: TTS [facility] 4h 64kg 400/A1.5 2/2.25 [place] Heparin 5000 LUA AVF Aranesp 160 ug/wk, no other meds        Assessment: 1. L knee pain - s/p irrigation and debridement of cartilage in multiple areas. Per ortho > septic joint / osteo vs. osteonecrosis variant. On emp abx, cx's pending. No fever, WBC up post op.  2. ESRD on HD 3. HTN/volume - BP up, wt down 70 > 66kg , still +2kg, breathing better. No cxr done. On MTP and dilt at home  4. 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 Hx Cdif on po vanc now 7. Acute resp failure- vol excess + underlying COPD, better 8. Afib - cont on MTP and dilt  9. ETOH - on CIWA protocol  Plan- HD tomorrow, get vol down further    Vinson Moselle MD  pager [phone]    cell [phone]  05/10/2014, 9:57 AM     Recent Labs Lab 05/08/14 0856 05/08/14 1733 05/09/14 0323 05/10/14 0313  NA 131* 136 137 134*  K 4.5 3.4* 4.5 3.6  CL 94*  --  98 95*  CO2 18*  --  28 25  GLUCOSE 172* 112* 160* 103*  BUN 60*  --  25* 22  CREATININE 9.75*  --  4.57* 3.32*  CALCIUM 8.1*  --  7.6* 7.9*  PHOS 11.3*  --   --  4.2    Recent Labs Lab 05/07/14 2130 05/08/14 0856 05/09/14 0323 05/10/14 0313  AST --   ALT --   ALKPHOS 122* 135* 112  --   BILITOT 0.3 0.5 0.5  --   PROT 5.5* 6.1 5.7*  --   ALBUMIN 1.9* 2.1*  2.0* 2.1*    Recent Labs Lab 05/07/14 2130 05/08/14 0856 05/08/14 1733 05/09/14 0323 05/10/14 0313  WBC 14.3* 8.2  --  11.9* 16.4*  NEUTROABS 14.0*  --   --   --   --   HGB 7.3* 8.8* 8.8* 8.0* 8.2*  HCT 23.3* 27.7* 26.0* 25.5* 27.1*  MCV 88.9 88.8  --  90.7 92.5  PLT 487* 534*  --  452* 458*   . antiseptic oral rinse  7 mL Mouth Rinse BID  . budesonide-formoterol  2 puff Inhalation BID  . darbepoetin (ARANESP) injection - DIALYSIS  200 mcg Intravenous Q Thu-HD  . diltiazem  180 mg Oral Daily  . docusate sodium  100 mg Oral BID  . escitalopram  10 mg Oral Daily  . folic acid  1 mg Oral Daily  . gabapentin  200 mg Oral BID  . guaiFENesin  600 mg Oral BID  . ipratropium  0.5 mg Nebulization Q6H  . LORazepam  0-4 mg Oral Q12H  . metoprolol tartrate  25 mg Oral BID  . multivitamin with minerals  1 tablet Oral Daily  . nicotine  7 mg Transdermal Daily  . roflumilast  500 mcg Oral Daily  . senna  1 tablet Oral BID  . sodium chloride  3 mL Intravenous Q12H  . sodium chloride  3 mL Intravenous Q12H  . thiamine  100 mg Oral Daily  . vancomycin  125 mg Oral QID   Followed by  . [START ON 05/14/2014] vancomycin  125 mg Oral BID   Followed by  . [START ON 05/22/2014] vancomycin  125 mg Oral Daily   Followed by  . [START ON 05/29/2014] vancomycin  125 mg Oral QODAY   Followed by  . [START ON 06/06/2014] vancomycin  125 mg Oral Q3 days  . [START ON 05/11/2014] vancomycin  750 mg Intravenous Q T,Th,Sa-HD   . sodium chloride 10 mL/hr at 05/08/14 1658   sodium chloride, bisacodyl, diphenhydrAMINE, HYDROmorphone (DILAUDID) injection, levalbuterol, LORazepam **OR** LORazepam, methocarbamol **OR** methocarbamol (ROBAXIN)  IV, metoCLOPramide **OR** metoCLOPramide (REGLAN) injection, metoprolol, ondansetron **OR** ondansetron (ZOFRAN) IV, oxyCODONE, polyethylene glycol, sodium chloride

## 2014-05-11 DIAGNOSIS — G4733 Obstructive sleep apnea (adult) (pediatric): Secondary | ICD-10-CM | POA: Insufficient documentation

## 2014-05-11 DIAGNOSIS — D6489 Other specified anemias: Secondary | ICD-10-CM | POA: Insufficient documentation

## 2014-05-11 LAB — OCCULT BLOOD X 1 CARD TO LAB, STOOL: FECAL OCCULT BLD: NEGATIVE

## 2014-05-11 MED ORDER — PENTAFLUOROPROP-TETRAFLUOROETH EX AERO: 1.0000 "application " | INHALATION_SPRAY | CUTANEOUS | Status: DC | PRN

## 2014-05-11 MED ORDER — HEPARIN SODIUM (PORCINE) 1000 UNIT/ML DIALYSIS: 1000.0000 [IU] | INTRAMUSCULAR | Status: DC | PRN

## 2014-05-11 MED ORDER — NEPRO/CARBSTEADY PO LIQD
237.0000 mL | ORAL | Status: DC | PRN
  Filled 2014-05-11: qty 237

## 2014-05-11 MED ORDER — ALTEPLASE 2 MG IJ SOLR: 2.0000 mg | Freq: Once | INTRAMUSCULAR | Status: AC | PRN

## 2014-05-11 MED ORDER — DIPHENHYDRAMINE HCL 25 MG PO CAPS
25.0000 mg | ORAL_CAPSULE | Freq: Once | ORAL | Status: AC
  Administered 2014-05-11: 25 mg via ORAL
  Filled 2014-05-11: qty 1

## 2014-05-11 MED ORDER — LIDOCAINE HCL (PF) 1 % IJ SOLN: 5.0000 mL | INTRAMUSCULAR | Status: DC | PRN

## 2014-05-11 MED ORDER — LIDOCAINE-PRILOCAINE 2.5-2.5 % EX CREA: 1.0000 "application " | TOPICAL_CREAM | CUTANEOUS | Status: DC | PRN

## 2014-05-11 MED ORDER — HEPARIN SODIUM (PORCINE) 1000 UNIT/ML DIALYSIS: 4000.0000 [IU] | Freq: Once | INTRAMUSCULAR | Status: DC

## 2014-05-11 MED ORDER — SODIUM CHLORIDE 0.9 % IV SOLN: 100.0000 mL | INTRAVENOUS | Status: DC | PRN

## 2014-05-11 MED ORDER — OXYCODONE HCL 5 MG PO TABS
ORAL_TABLET | ORAL | Status: AC
  Filled 2014-05-11: qty 1

## 2014-05-11 NOTE — Progress Notes (Signed)
Subjective: 3 Days Post-Op Procedure(s) (LRB): ARTHROSCOPY KNEE I & D (Left) Patient reports pain as moderate.  He says this is due to switching from IV to PO narcotics.  No nausea/vomiting, lightheadedness/dizziness.    Objective: Vital signs in last 24 hours: Temp:  [97.3 F (36.3 C)-97.9 F (36.6 C)] 97.6 F (36.4 C) (01/02 0900) Pulse Rate:  [83-105] 89 (01/02 0900) Resp:  [10-26] 21 (01/02 0900) BP: (131-146)/(77-94) 145/87 mmHg (01/02 0900) SpO2:  [91 %-100 %] 100 % (01/02 0900)  Intake/Output from previous day: 01/01 0701 - 01/02 0700 In: 240 [P.O.:240] Out: -  Intake/Output this shift: Total I/O In: 120 [P.O.:120] Out: -    Recent Labs  05/08/14 1733 05/09/14 0323 05/10/14 0313  HGB 8.8* 8.0* 8.2*    Recent Labs  05/09/14 0323 05/10/14 0313  WBC 11.9* 16.4*  RBC 2.81* 2.93*  HCT 25.5* 27.1*  PLT 452* 458*    Recent Labs  05/09/14 0323 05/10/14 0313  NA 137 134*  K 4.5 3.6  CL 98 95*  CO2 28 25  BUN 25* 22  CREATININE 4.57* 3.32*  GLUCOSE 160* 103*  CALCIUM 7.6* 7.9*   No results for input(s): LABPT, INR in the last 72 hours.  Neurologically intact Neurovascular intact Sensation intact distally Intact pulses distally Dorsiflexion/Plantar flexion intact Compartment soft  No drainage noted through dressing  Assessment/Plan: 3 Days Post-Op Procedure(s) (LRB): ARTHROSCOPY KNEE I & D (Left) Up with therapy  Advance diet as tolerated WBAT LLE Dry dressing change prn Awaiting cultures.  Will most likely consult ID once cultures received ABLA-will defer to medicine Continue plan per medicine  ANTON, M. LINDSEY 05/11/2014, 11:14 AM

## 2014-05-11 NOTE — Progress Notes (Signed)
Pt. Refuses CPAP at this time. Pt. Was made aware to let RT or RN know anytime during the night if he changes his mind & decides to wear CPAP.

## 2014-05-11 NOTE — Progress Notes (Signed)
[facility] TEAM 1 - Stepdown/ICU TEAM Progress Note  [name] ZOX:[id number] DOB: 1960/06/30 DOA: 05/07/2014 PCP: Wilmer Floor., MD  Admit HPI / Brief Narrative: 89 old WM PMHx multiple medical problems including chronic kidney disease on dialysis, systolic congestive heart failure, sleep apnea on CPAP, and chronic low back pain who reported for at least 3 weeks progressive left knee pain and swelling. He initially presented to [facility] where he underwent arthrocentesis in the emergency department with no growth on the cultures and no crystals. Symptoms apparently improved after that procedure was done but 2 days prior to presenting to this hospital he again developed pain and swelling of the left knee. In the emergency department at [facility] he was in atrial fibrillation with RVR with ventricular rates into the 150s. He reported he had run out of his Lopressor and had not taken this for several days. He had a repeat arthrocentesis done as well with 50,457 white cells but no bacteria. No fevers no chills. Orthopedic service at outside ER evaluated the patient and felt symptoms were inflammatory in nature. Patient otherwise was to follow-up with orthopedics physician as an outpatient. Because of his ongoing atrial flutter with RVR and need for dialysis patient was transferred to [facility]. In addition to the above history patient has a history C. difficile colitis and has been on Flagyl which he takes inconsistently. He was also supposed to be on oral vancomycin but unsure why he ran out. He reported continued issues with diarrhea. Patient reports has not been to dialysis in several days.  HPI/Subjective: 1/2 A/O 4, states left knee pain improved , negative SOB, negative CP  Assessment/Plan: Septic arthritis and effusion left knee; status post arthroscopy with irrigation'debridement and lavage (12/30) - MRI of left knee knee; showed infarcts and subchondral destruction  see results below.  -Patient's white blood cell count climbed today while on antibiotic; left knee fluid culture pending, if WBCs increase again tomorrow or patient spikes fever will need to evaluate for additional source of infection -continue empiric vancomycin with HD   Acute respiratory failure with hypoxia / COPD / OSA -Multifactorial: Primarily suspect volume overload due to missed dialysis treatments - minimal wheezing  not compatible with COPD exacerbation, reevaluate after dialysis -CPAP per respiratory -Resolved   CKD stage V requiring chronic dialysis(T/Th/Sat) - usual days are Tuesday Thursday Saturday -extra dialysis preoperatively on 12/30 since had missed several treatments prior to admission with net ultrafiltration of 4000 mL   Atrial fibrillation with RVR -Current rate controlled and maintaining sinus rhythm  - suspect initial tachycardia related to medication nonadherence  - cont Cardizem 180 mg daily  -Continue Lopressor 25 mg  BID  Diarrhea / history of inadequately treated C. difficile colitis due to noncompliance  -No further diarrhea since admission  - C. difficile PCR  (previous positive findings on 10/8 in 10/22 and patient has admitted to inconsistent dosing of prescribed Flagyl and oral vancomycin) -Continue vancomycin PO  Peripheral vascular disease -Likely explains left femur infarcts (see above)  ETOH abuse  -cont CIWA  Chronic systolic heart failure -Primary mode of treatment hemodialysis  - last echocardiogram in October 2015 with normal LV function and grade 1 diastolic dysfunction; at this juncture no indication to pursue repeat echo  Anemia -Hgb stable at around baseline of 8.8 to 9.4     Code Status: FULL Family Communication: no family present at time of exam Disposition Plan: Per orthopedic surgery    Consultants: Dr.  Delano Metz (Nephrology) Dr. Teryl Lucy (Orthopedics)  Procedure/Significant Events: 12/30 MRI left  knee without contrast;- Large complex knee joint effusion and Baker's cyst consistent with synovitis. -No evidence of osteomyelitis. 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 - subchondral collapse lateral femoral condyle anteriorly with associated chondral irregularity.- Intact menisci and ligaments. 12/30 Left knee arthroscopy with irrigation and debridement and lavage for infection with extensive debridement, chondroplasty of the lateral femoral condyle, removal of loose body, medial and lateral meniscal debridement.   Culture 12/29 MRSA by PCR negative 12/29 blood right arm/hand NGTD 12/30 left knee synovial fluid x 4 NGTD   Antibiotics: IV vancomycin 12/29 at hemodialysis T/Th/Sat>> PO vancomycin 12/29>>  DVT prophylaxis: SCD   Devices    LINES / TUBES:  12/30 left knee Hemovac>> removed 1/1    Continuous Infusions: . sodium chloride 10 mL/hr at 05/08/14 1658    Objective: VITAL SIGNS: Temp: 97.4 F (36.3 C) (01/02 1552) Temp Source: Oral (01/02 1552) BP: 146/88 mmHg (01/02 1552) Pulse Rate: 96 (01/02 1552) SPO2; FIO2:   Intake/Output Summary (Last 24 hours) at 05/11/14 1701 Last data filed at 05/11/14 0703  Gross per 24 hour  Intake    240 ml  Output      0 ml  Net    240 ml     Exam: General: A/O 4, left knee pain rated 8 /10 (however patient sitting comfortably in bed), No acute respiratory distress Lungs: Diffuse wheezing, by basilar crackles  Cardiovascular: Tachycardic, Regular rhythm without murmur gallop or rub normal S1 and S2 Abdomen: Nontender, nondistended, soft, bowel sounds positive, no rebound, no ascites, no appreciable mass Extremities: Ace wrap in place, appropriately tender with palpation    Data Reviewed: Basic Metabolic Panel:  Recent Labs Lab 05/07/14 2130 05/08/14 0856 05/08/14 1733 05/09/14 0323 05/10/14 0313  NA 132* 131* 136 137 134*  K 4.0 4.5 3.4* 4.5 3.6  CL 97 94*  --  98 95*    CO2 19 18*  --  28 25  GLUCOSE 155* 172* 112* 160* 103*  BUN 55* 60*  --  25* 22  CREATININE 9.29* 9.75*  --  4.57* 3.32*  CALCIUM 8.0* 8.1*  --  7.6* 7.9*  MG  --  2.2  --   --   --   PHOS  --  11.3*  --   --  4.2   Liver Function Tests:  Recent Labs Lab 05/07/14 2130 05/08/14 0856 05/09/14 0323 05/10/14 0313  AST --   ALT --   ALKPHOS 122* 135* 112  --   BILITOT 0.3 0.5 0.5  --   PROT 5.5* 6.1 5.7*  --   ALBUMIN 1.9* 2.1* 2.0* 2.1*   No results for input(s): LIPASE, AMYLASE in the last 168 hours. No results for input(s): AMMONIA in the last 168 hours. CBC:  Recent Labs Lab 05/07/14 2130 05/08/14 0856 05/08/14 1733 05/09/14 0323 05/10/14 0313  WBC 14.3* 8.2  --  11.9* 16.4*  NEUTROABS 14.0*  --   --   --   --   HGB 7.3* 8.8* 8.8* 8.0* 8.2*  HCT 23.3* 27.7* 26.0* 25.5* 27.1*  MCV 88.9 88.8  --  90.7 92.5  PLT 487* 534*  --  452* 458*   Cardiac Enzymes:  Recent Labs Lab 05/07/14 2130 05/08/14 0856  TROPONINI 0.04* 0.05*   BNP (last 3 results)  Recent Labs  02/09/14 1425  PROBNP >  70000.0*   CBG: No results for input(s): GLUCAP in the last 168 hours.  Recent Results (from the past 240 hour(s))  MRSA PCR Screening     Status: None   Collection Time: 05/07/14  6:41 PM  Result Value Ref Range Status   MRSA by PCR NEGATIVE NEGATIVE Final    Comment:        The GeneXpert MRSA Assay (FDA approved for NASAL specimens only), is one component of a comprehensive MRSA colonization surveillance program. It is not intended to diagnose MRSA infection nor to guide or monitor treatment for MRSA infections.   Culture, blood (routine x 2)     Status: None (Preliminary result)   Collection Time: 05/07/14  9:30 PM  Result Value Ref Range Status   Specimen Description BLOOD RIGHT ARM  Final   Special Requests BOTTLES DRAWN AEROBIC AND ANAEROBIC 10CC  Final   Culture   Final           BLOOD CULTURE RECEIVED NO GROWTH TO DATE CULTURE WILL BE  HELD FOR 5 DAYS BEFORE ISSUING A FINAL NEGATIVE REPORT Performed at [facility]    Report Status PENDING  Incomplete  Culture, blood (routine x 2)     Status: None (Preliminary result)   Collection Time: 05/07/14  9:40 PM  Result Value Ref Range Status   Specimen Description BLOOD RIGHT HAND  Final   Special Requests BOTTLES DRAWN AEROBIC AND ANAEROBIC 5CC  Final   Culture   Final           BLOOD CULTURE RECEIVED NO GROWTH TO DATE CULTURE WILL BE HELD FOR 5 DAYS BEFORE ISSUING A FINAL NEGATIVE REPORT Performed at [facility]    Report Status PENDING  Incomplete  Anaerobic culture     Status: None (Preliminary result)   Collection Time: 05/08/14  6:38 PM  Result Value Ref Range Status   Specimen Description SYNOVIAL FLUID KNEE LEFT  Final   Special Requests SPECIMEN B  Final   Gram Stain   Final    MODERATE WBC PRESENT,BOTH PMN AND MONONUCLEAR NO ORGANISMS SEEN Performed at [facility]    Culture   Final    NO ANAEROBES ISOLATED; CULTURE IN PROGRESS FOR 5 DAYS Performed at [facility]    Report Status PENDING  Incomplete  Body fluid culture     Status: None (Preliminary result)   Collection Time: 05/08/14  6:38 PM  Result Value Ref Range Status   Specimen Description SYNOVIAL FLUID KNEE LEFT  Final   Special Requests SPECIMEN B  Final   Gram Stain   Final    MODERATE WBC PRESENT,BOTH PMN AND MONONUCLEAR NO ORGANISMS SEEN Performed at [facility]    Culture NO GROWTH Performed at [facility]   Final   Report Status PENDING  Incomplete  Anaerobic culture     Status: None (Preliminary result)   Collection Time: 05/08/14  6:42 PM  Result Value Ref Range Status   Specimen Description SYNOVIAL KNEE LEFT  Final   Special Requests SPECIMEN C  Final   Gram Stain   Final    MODERATE WBC PRESENT,BOTH PMN AND MONONUCLEAR NO ORGANISMS SEEN Performed at [facility]    Culture   Final    NO ANAEROBES ISOLATED;  CULTURE IN PROGRESS FOR 5 DAYS Performed at [facility]    Report Status PENDING  Incomplete  Body fluid culture     Status: None (Preliminary result)  Collection Time: 05/08/14  6:42 PM  Result Value Ref Range Status   Specimen Description SYNOVIAL KNEE LEFT  Final   Special Requests SPECIMEN C  Final   Gram Stain   Final    MODERATE WBC PRESENT,BOTH PMN AND MONONUCLEAR NO ORGANISMS SEEN Performed at [facility]    Culture PENDING  Incomplete   Report Status PENDING  Incomplete  Anaerobic culture     Status: None (Preliminary result)   Collection Time: 05/08/14  6:44 PM  Result Value Ref Range Status   Specimen Description SYNOVIAL FLUID KNEE LEFT  Final   Special Requests SPECIMEN D  Final   Gram Stain   Final    MODERATE WBC PRESENT,BOTH PMN AND MONONUCLEAR NO ORGANISMS SEEN Performed at [facility]    Culture   Final    NO ANAEROBES ISOLATED; CULTURE IN PROGRESS FOR 5 DAYS Performed at [facility]    Report Status PENDING  Incomplete  Body fluid culture     Status: None (Preliminary result)   Collection Time: 05/08/14  6:44 PM  Result Value Ref Range Status   Specimen Description SYNOVIAL FLUID KNEE LEFT  Final   Special Requests SPECIMEN D  Final   Gram Stain   Final    MODERATE WBC PRESENT,BOTH PMN AND MONONUCLEAR NO ORGANISMS SEEN Performed at [facility]    Culture NO GROWTH Performed at [facility]   Final   Report Status PENDING  Incomplete  Anaerobic culture     Status: None (Preliminary result)   Collection Time: 05/08/14  6:55 PM  Result Value Ref Range Status   Specimen Description SYNOVIAL FLUID KNEE LEFT  Final   Special Requests SPECIMEN E  Final   Gram Stain   Final    MODERATE WBC PRESENT,BOTH PMN AND MONONUCLEAR NO ORGANISMS SEEN Performed at [facility]    Culture   Final    NO ANAEROBES ISOLATED; CULTURE IN PROGRESS FOR 5 DAYS Performed at [facility]    Report  Status PENDING  Incomplete  Body fluid culture     Status: None (Preliminary result)   Collection Time: 05/08/14  6:55 PM  Result Value Ref Range Status   Specimen Description SYNOVIAL FLUID KNEE LEFT  Final   Special Requests SPECIMEN E  Final   Gram Stain   Final    MODERATE WBC PRESENT,BOTH PMN AND MONONUCLEAR NO ORGANISMS SEEN Performed at [facility]    Culture NO GROWTH Performed at [facility]   Final   Report Status PENDING  Incomplete     Studies:  Recent x-ray studies have been reviewed in detail by the Attending Physician  Scheduled Meds:  Scheduled Meds: . antiseptic oral rinse  7 mL Mouth Rinse BID  . budesonide-formoterol  2 puff Inhalation BID  . calcium acetate  667 mg Oral TID WC  . darbepoetin (ARANESP) injection - DIALYSIS  200 mcg Intravenous Q Thu-HD  . diltiazem  180 mg Oral Daily  . docusate sodium  100 mg Oral BID  . escitalopram  10 mg Oral Daily  . folic acid  1 mg Oral Daily  . gabapentin  200 mg Oral BID  . guaiFENesin  600 mg Oral BID  . [START ON 05/12/2014] heparin  4,000 Units Dialysis Once in dialysis  . ipratropium  0.5 mg Nebulization Q6H  . LORazepam  0-4 mg Oral Q12H  . metoprolol tartrate  25 mg Oral BID  . morphine  15 mg Oral Q12H  . multivitamin with minerals  1 tablet Oral Daily  . nicotine  7 mg Transdermal Daily  . oxyCODONE      . roflumilast  500 mcg Oral Daily  . senna  1 tablet Oral BID  . sodium chloride  3 mL Intravenous Q12H  . sodium chloride  3 mL Intravenous Q12H  . thiamine  100 mg Oral Daily  . vancomycin  125 mg Oral QID   Followed by  . [START ON 05/14/2014] vancomycin  125 mg Oral BID   Followed by  . [START ON 05/22/2014] vancomycin  125 mg Oral Daily   Followed by  . [START ON 05/29/2014] vancomycin  125 mg Oral QODAY   Followed by  . [START ON 06/06/2014] vancomycin  125 mg Oral Q3 days  . vancomycin  750 mg Intravenous Q T,Th,Sa-HD    Time spent on care of this patient: 40  mins   Drema Dallas , MD   [facility] Office  [phone] Pager [phone]  On-Call/Text Page:      [email or web address]      password [email or web address]  If 7PM-7AM, please contact night-coverage [email or web address] Password [email or web address] 05/11/2014, 5:01 PM   LOS: 4 days

## 2014-05-12 DIAGNOSIS — A0472 Enterocolitis due to Clostridium difficile, not specified as recurrent: Secondary | ICD-10-CM | POA: Insufficient documentation

## 2014-05-12 DIAGNOSIS — A047 Enterocolitis due to Clostridium difficile: Secondary | ICD-10-CM

## 2014-05-12 DIAGNOSIS — F1010 Alcohol abuse, uncomplicated: Secondary | ICD-10-CM | POA: Insufficient documentation

## 2014-05-12 LAB — CBC WITH DIFFERENTIAL/PLATELET
BASOS ABS: 0 10*3/uL (ref 0.0–0.1)
BASOS PCT: 0 % (ref 0–1)
Eosinophils Absolute: 0.2 10*3/uL (ref 0.0–0.7)
Eosinophils Relative: 1 % (ref 0–5)
HEMATOCRIT: 28.5 % — AB (ref 39.0–52.0)
Hemoglobin: 8.6 g/dL — ABNORMAL LOW (ref 13.0–17.0)
Lymphocytes Relative: 4 % — ABNORMAL LOW (ref 12–46)
Lymphs Abs: 0.7 10*3/uL (ref 0.7–4.0)
MCH: 28.5 pg (ref 26.0–34.0)
MCHC: 30.2 g/dL (ref 30.0–36.0)
MCV: 94.4 fL (ref 78.0–100.0)
MONO ABS: 0.7 10*3/uL (ref 0.1–1.0)
Monocytes Relative: 5 % (ref 3–12)
Neutro Abs: 14.1 10*3/uL — ABNORMAL HIGH (ref 1.7–7.7)
Neutrophils Relative %: 90 % — ABNORMAL HIGH (ref 43–77)
Platelets: 491 10*3/uL — ABNORMAL HIGH (ref 150–400)
RBC: 3.02 MIL/uL — AB (ref 4.22–5.81)
RDW: 16.4 % — ABNORMAL HIGH (ref 11.5–15.5)
WBC: 15.7 10*3/uL — AB (ref 4.0–10.5)

## 2014-05-12 LAB — COMPREHENSIVE METABOLIC PANEL
ALK PHOS: 108 U/L (ref 39–117)
ALT: 11 U/L (ref 0–53)
AST: 12 U/L (ref 0–37)
Albumin: 2.1 g/dL — ABNORMAL LOW (ref 3.5–5.2)
Anion gap: 11 (ref 5–15)
BILIRUBIN TOTAL: 0.8 mg/dL (ref 0.3–1.2)
BUN: 26 mg/dL — AB (ref 6–23)
CALCIUM: 8.5 mg/dL (ref 8.4–10.5)
CHLORIDE: 94 meq/L — AB (ref 96–112)
CO2: 28 mmol/L (ref 19–32)
CREATININE: 4.32 mg/dL — AB (ref 0.50–1.35)
GFR calc Af Amer: 17 mL/min — ABNORMAL LOW (ref >90)
GFR calc non Af Amer: 14 mL/min — ABNORMAL LOW (ref >90)
Glucose, Bld: 105 mg/dL — ABNORMAL HIGH (ref 70–99)
Potassium: 4.3 mmol/L (ref 3.5–5.1)
SODIUM: 133 mmol/L — AB (ref 135–145)
TOTAL PROTEIN: 5.8 g/dL — AB (ref 6.0–8.3)

## 2014-05-12 LAB — BODY FLUID CULTURE
CULTURE: NO GROWTH
Culture: NO GROWTH
Culture: NO GROWTH
Culture: NO GROWTH

## 2014-05-12 LAB — CLOSTRIDIUM DIFFICILE BY PCR: CDIFFPCR: POSITIVE — AB

## 2014-05-12 LAB — MAGNESIUM: Magnesium: 2.3 mg/dL (ref 1.5–2.5)

## 2014-05-12 MED ORDER — ESCITALOPRAM OXALATE 20 MG PO TABS
20.0000 mg | ORAL_TABLET | Freq: Every day | ORAL | Status: DC
  Administered 2014-05-12 – 2014-05-14 (×3): 20 mg via ORAL
  Filled 2014-05-12 (×3): qty 1

## 2014-05-12 MED ORDER — LORAZEPAM 0.5 MG PO TABS
0.5000 mg | ORAL_TABLET | Freq: Two times a day (BID) | ORAL | Status: DC | PRN
  Administered 2014-05-13 – 2014-05-14 (×4): 0.5 mg via ORAL
  Filled 2014-05-12 (×5): qty 1

## 2014-05-12 MED ORDER — IPRATROPIUM BROMIDE 0.02 % IN SOLN
0.5000 mg | Freq: Three times a day (TID) | RESPIRATORY_TRACT | Status: DC
  Administered 2014-05-13 (×3): 0.5 mg via RESPIRATORY_TRACT
  Filled 2014-05-12 (×4): qty 2.5

## 2014-05-12 NOTE — Progress Notes (Signed)
Pt. Refuses CPAP at this time. Pt. was made aware to let RT or RN know anytime during the night if he changes his mind & decides to wear CPAP.

## 2014-05-12 NOTE — Progress Notes (Signed)
[place] TEAM 1 - Stepdown/ICU TEAM Progress Note  Albert HUNSUCKER Massey:[id number] DOB: 1960-10-04 DOA: 05/07/2014 PCP: Wilmer Floor., MD  Admit HPI / Brief Narrative: 72 old WM PMHx multiple medical problems including chronic kidney disease on dialysis, systolic congestive heart failure, sleep apnea on CPAP, and chronic low back pain who reported for at least 3 weeks progressive left knee pain and swelling. He initially presented to [facility] where he underwent arthrocentesis in the emergency department with no growth on the cultures and no crystals. Symptoms apparently improved after that procedure was done but 2 days prior to presenting to this hospital he again developed pain and swelling of the left knee. In the emergency department at [facility] he was in atrial fibrillation with RVR with ventricular rates into the 150s. He reported he had run out of his Lopressor and had not taken this for several days. He had a repeat arthrocentesis done as well with 50,457 white cells but no bacteria. No fevers no chills. Orthopedic service at outside ER evaluated the patient and felt symptoms were inflammatory in nature. Patient otherwise was to follow-up with orthopedics physician as an outpatient. Because of his ongoing atrial flutter with RVR and need for dialysis patient was transferred to [facility] In addition to the above history patient has a history C. difficile colitis and has been on Flagyl which he takes inconsistently. He was also supposed to be on oral vancomycin but unsure why he ran out. He reported continued issues with diarrhea. Patient reports has not been to dialysis in several days.  HPI/Subjective: 1/3 A/O 4, states left knee pain improved , negative SOB, negative CP  Assessment/Plan: Septic arthritis and effusion left knee; status post arthroscopy with irrigation'debridement and lavage (12/30) - MRI of left knee knee; showed infarcts and subchondral destruction  see results below.  -Patient's white blood cell count climbed today while on antibiotic; left knee fluid culture pending, if WBCs increase again tomorrow or patient spikes fever will need to evaluate for additional source of infection -continue empiric vancomycin with HD   Acute respiratory failure with hypoxia / COPD / OSA -Multifactorial: Primarily suspect volume overload due to missed dialysis treatments -CPAP per respiratory -Resolved   CKD stage V requiring chronic dialysis(T/Th/Sat) - usual days are Tuesday Thursday Saturday -extra dialysis preoperatively on 12/30 since had missed several treatments prior to admission with net ultrafiltration of 4000 mL   Atrial fibrillation with RVR -Current rate controlled and maintaining sinus rhythm  - suspect initial tachycardia related to medication nonadherence  - cont Cardizem 180 mg daily  -Continue Lopressor 25 mg  BID  Diarrhea / C. difficile colitis due to noncompliance  -No further diarrhea since admission  -1/2 C. difficile positive -Continue vancomycin PO  Peripheral vascular disease -Likely explains left femur infarcts (see above)  ETOH abuse  -cont CIWA  Chronic systolic heart failure -Primary mode of treatment hemodialysis  - last echocardiogram in October 2015 with normal LV function and grade 1 diastolic dysfunction; at this juncture no indication to pursue repeat echo  Anemia -Hgb stable at around baseline of 8.8 to 9.4     Code Status: FULL Family Communication: no family present at time of exam Disposition Plan: Per orthopedic surgery    Consultants: Dr. Delano Metz (Nephrology) Dr. Teryl Lucy (Orthopedics)  Procedure/Significant Events: 12/30 MRI left knee without contrast;- Large complex knee joint effusion and Baker's cyst consistent with synovitis. -No evidence of osteomyelitis. - Multiple bone infarcts involving the  distal femur and proximal tibia. - subchondral collapse lateral femoral  condyle anteriorly with associated chondral irregularity.- Intact menisci and ligaments. 12/30 Left knee arthroscopy with irrigation and debridement and lavage for infection with extensive debridement, chondroplasty of the lateral femoral condyle, removal of loose body, medial and lateral meniscal debridement.   Culture 12/29 MRSA by PCR negative 12/29 blood right arm/hand NGTD 12/30 left knee synovial fluid x 4 NGTD 1/2 C. difficile positive by PCR  Antibiotics: IV vancomycin 12/29 at hemodialysis T/Th/Sat>> PO vancomycin 12/29>>  DVT prophylaxis: SCD   Devices    LINES / TUBES:  12/30 left knee Hemovac>> removed 1/1    Continuous Infusions: . sodium chloride 10 mL/hr at 05/08/14 1658    Objective: VITAL SIGNS: Temp: 97.6 F (36.4 C) (01/03 0754) Temp Source: Oral (01/03 0754) BP: 126/78 mmHg (01/03 0754) Pulse Rate: 88 (01/03 0754) SPO2; FIO2:   Intake/Output Summary (Last 24 hours) at 05/12/14 0843 Last data filed at 05/12/14 0700  Gross per 24 hour  Intake    240 ml  Output      1 ml  Net    239 ml     Exam: General: A/O 4, left knee pain rated 8 /10 (however just completed ambulating around room with walker), No acute respiratory distress Lungs: Clear to auscultation bilateral   Cardiovascular: Regular rhythm and rate without murmur gallop or rub normal S1 and S2 Abdomen: Nontender, nondistended, soft, bowel sounds positive, no rebound, no ascites, no appreciable mass Extremities: Ace wrap in place, appropriately tender with palpation; Ace wrap and dressing taken down mild bloody drainage appropriate for day 3  left knee; arthroscopy    Data Reviewed: Basic Metabolic Panel:  Recent Labs Lab 05/07/14 2130 05/08/14 0856 05/08/14 1733 05/09/14 0323 05/10/14 0313  NA 132* 131* 136 137 134*  K 4.0 4.5 3.4* 4.5 3.6  CL 97 94*  --  98 95*  CO2 19 18*  --  28 25  GLUCOSE 155* 172* 112* 160* 103*  BUN 55* 60*  --  25* 22  CREATININE 9.29* 9.75*   --  4.57* 3.32*  CALCIUM 8.0* 8.1*  --  7.6* 7.9*  MG  --  2.2  --   --   --   PHOS  --  11.3*  --   --  4.2   Liver Function Tests:  Recent Labs Lab 05/07/14 2130 05/08/14 0856 05/09/14 0323 05/10/14 0313  AST 13 19 15   --   ALT 9 9 9   --   ALKPHOS 122* 135* 112  --   BILITOT 0.3 0.5 0.5  --   PROT 5.5* 6.1 5.7*  --   ALBUMIN 1.9* 2.1* 2.0* 2.1*   No results for input(s): LIPASE, AMYLASE in the last 168 hours. No results for input(s): AMMONIA in the last 168 hours. CBC:  Recent Labs Lab 05/07/14 2130 05/08/14 0856 05/08/14 1733 05/09/14 0323 05/10/14 0313  WBC 14.3* 8.2  --  11.9* 16.4*  NEUTROABS 14.0*  --   --   --   --   HGB 7.3* 8.8* 8.8* 8.0* 8.2*  HCT 23.3* 27.7* 26.0* 25.5* 27.1*  MCV 88.9 88.8  --  90.7 92.5  PLT 487* 534*  --  452* 458*   Cardiac Enzymes:  Recent Labs Lab 05/07/14 2130 05/08/14 0856  TROPONINI 0.04* 0.05*   BNP (last 3 results)  Recent Labs  02/09/14 1425  PROBNP >70000.0*   CBG: No results for input(s): GLUCAP in the last 168  hours.  Recent Results (from the past 240 hour(s))  MRSA PCR Screening     Status: None   Collection Time: 05/07/14  6:41 PM  Result Value Ref Range Status   MRSA by PCR NEGATIVE NEGATIVE Final    Comment:        The GeneXpert MRSA Assay (FDA approved for NASAL specimens only), is one component of a comprehensive MRSA colonization surveillance program. It is not intended to diagnose MRSA infection nor to guide or monitor treatment for MRSA infections.   Culture, blood (routine x 2)     Status: None (Preliminary result)   Collection Time: 05/07/14  9:30 PM  Result Value Ref Range Status   Specimen Description BLOOD RIGHT ARM  Final   Special Requests BOTTLES DRAWN AEROBIC AND ANAEROBIC 10CC  Final   Culture   Final           BLOOD CULTURE RECEIVED NO GROWTH TO DATE CULTURE WILL BE HELD FOR 5 DAYS BEFORE ISSUING A FINAL NEGATIVE REPORT Performed at [facility]    Report Status  PENDING  Incomplete  Culture, blood (routine x 2)     Status: None (Preliminary result)   Collection Time: 05/07/14  9:40 PM  Result Value Ref Range Status   Specimen Description BLOOD RIGHT HAND  Final   Special Requests BOTTLES DRAWN AEROBIC AND ANAEROBIC 5CC  Final   Culture   Final           BLOOD CULTURE RECEIVED NO GROWTH TO DATE CULTURE WILL BE HELD FOR 5 DAYS BEFORE ISSUING A FINAL NEGATIVE REPORT Performed at [facility]    Report Status PENDING  Incomplete  Anaerobic culture     Status: None (Preliminary result)   Collection Time: 05/08/14  6:38 PM  Result Value Ref Range Status   Specimen Description SYNOVIAL FLUID KNEE LEFT  Final   Special Requests SPECIMEN B  Final   Gram Stain   Final    MODERATE WBC PRESENT,BOTH PMN AND MONONUCLEAR NO ORGANISMS SEEN Performed at [facility]    Culture   Final    NO ANAEROBES ISOLATED; CULTURE IN PROGRESS FOR 5 DAYS Performed at [facility]    Report Status PENDING  Incomplete  Body fluid culture     Status: None (Preliminary result)   Collection Time: 05/08/14  6:38 PM  Result Value Ref Range Status   Specimen Description SYNOVIAL FLUID KNEE LEFT  Final   Special Requests SPECIMEN B  Final   Gram Stain   Final    MODERATE WBC PRESENT,BOTH PMN AND MONONUCLEAR NO ORGANISMS SEEN Performed at [facility]    Culture NO GROWTH Performed at [facility]   Final   Report Status PENDING  Incomplete  Anaerobic culture     Status: None (Preliminary result)   Collection Time: 05/08/14  6:42 PM  Result Value Ref Range Status   Specimen Description SYNOVIAL KNEE LEFT  Final   Special Requests SPECIMEN C  Final   Gram Stain   Final    MODERATE WBC PRESENT,BOTH PMN AND MONONUCLEAR NO ORGANISMS SEEN Performed at [facility]    Culture   Final    NO ANAEROBES ISOLATED; CULTURE IN PROGRESS FOR 5 DAYS Performed at [facility]    Report Status PENDING  Incomplete  Body  fluid culture     Status: None (Preliminary result)   Collection Time: 05/08/14  6:42 PM  Result Value Ref Range Status  Specimen Description SYNOVIAL KNEE LEFT  Final   Special Requests SPECIMEN C  Final   Gram Stain   Final    MODERATE WBC PRESENT,BOTH PMN AND MONONUCLEAR NO ORGANISMS SEEN Performed at [facility]    Culture PENDING  Incomplete   Report Status PENDING  Incomplete  Anaerobic culture     Status: None (Preliminary result)   Collection Time: 05/08/14  6:44 PM  Result Value Ref Range Status   Specimen Description SYNOVIAL FLUID KNEE LEFT  Final   Special Requests SPECIMEN D  Final   Gram Stain   Final    MODERATE WBC PRESENT,BOTH PMN AND MONONUCLEAR NO ORGANISMS SEEN Performed at [facility]    Culture   Final    NO ANAEROBES ISOLATED; CULTURE IN PROGRESS FOR 5 DAYS Performed at [facility]    Report Status PENDING  Incomplete  Body fluid culture     Status: None (Preliminary result)   Collection Time: 05/08/14  6:44 PM  Result Value Ref Range Status   Specimen Description SYNOVIAL FLUID KNEE LEFT  Final   Special Requests SPECIMEN D  Final   Gram Stain   Final    MODERATE WBC PRESENT,BOTH PMN AND MONONUCLEAR NO ORGANISMS SEEN Performed at [facility]    Culture NO GROWTH Performed at [facility]   Final   Report Status PENDING  Incomplete  Anaerobic culture     Status: None (Preliminary result)   Collection Time: 05/08/14  6:55 PM  Result Value Ref Range Status   Specimen Description SYNOVIAL FLUID KNEE LEFT  Final   Special Requests SPECIMEN E  Final   Gram Stain   Final    MODERATE WBC PRESENT,BOTH PMN AND MONONUCLEAR NO ORGANISMS SEEN Performed at [facility]    Culture   Final    NO ANAEROBES ISOLATED; CULTURE IN PROGRESS FOR 5 DAYS Performed at [facility]    Report Status PENDING  Incomplete  Body fluid culture     Status: None (Preliminary result)   Collection Time: 05/08/14   6:55 PM  Result Value Ref Range Status   Specimen Description SYNOVIAL FLUID KNEE LEFT  Final   Special Requests SPECIMEN E  Final   Gram Stain   Final    MODERATE WBC PRESENT,BOTH PMN AND MONONUCLEAR NO ORGANISMS SEEN Performed at [facility]    Culture NO GROWTH Performed at [facility]   Final   Report Status PENDING  Incomplete     Studies:  Recent x-ray studies have been reviewed in detail by the Attending Physician  Scheduled Meds:  Scheduled Meds: . antiseptic oral rinse  7 mL Mouth Rinse BID  . budesonide-formoterol  2 puff Inhalation BID  . calcium acetate  667 mg Oral TID WC  . darbepoetin (ARANESP) injection - DIALYSIS  200 mcg Intravenous Q Thu-HD  . diltiazem  180 mg Oral Daily  . docusate sodium  100 mg Oral BID  . escitalopram  10 mg Oral Daily  . folic acid  1 mg Oral Daily  . gabapentin  200 mg Oral BID  . guaiFENesin  600 mg Oral BID  . heparin  4,000 Units Dialysis Once in dialysis  . ipratropium  0.5 mg Nebulization Q6H  . metoprolol tartrate  25 mg Oral BID  . morphine  15 mg Oral Q12H  . multivitamin with minerals  1 tablet Oral Daily  . nicotine  7 mg Transdermal Daily  .  roflumilast  500 mcg Oral Daily  . senna  1 tablet Oral BID  . sodium chloride  3 mL Intravenous Q12H  . sodium chloride  3 mL Intravenous Q12H  . thiamine  100 mg Oral Daily  . vancomycin  125 mg Oral QID   Followed by  . [START ON 05/14/2014] vancomycin  125 mg Oral BID   Followed by  . [START ON 05/22/2014] vancomycin  125 mg Oral Daily   Followed by  . [START ON 05/29/2014] vancomycin  125 mg Oral QODAY   Followed by  . [START ON 06/06/2014] vancomycin  125 mg Oral Q3 days  . vancomycin  750 mg Intravenous Q T,Th,Sa-HD    Time spent on care of this patient: 40 mins   Drema Dallas , MD   [facility] Office  [phone] Pager [phone]  On-Call/Text Page:      [email or web address]      password [email or web address]  If 7PM-7AM, please contact  night-coverage [email or web address] Password [email or web address] 05/12/2014, 8:43 AM   LOS: 5 days

## 2014-05-12 NOTE — Progress Notes (Signed)
CRITICAL VALUE ALERT  Critical value received: C-Diff positive Date of notification:  05/12/14 Time of notification: 1104 Critical value read back: yes  Nurse who received alert:  Vincente Poli, RN MD notified (1st page): yes- Already on unit Time of first page:  MD notified (2nd page):  Time of second page:  Responding MD:  Dr. Joseph Art  Time MD responded: 1106

## 2014-05-12 NOTE — Progress Notes (Signed)
ANTIBIOTIC CONSULT NOTE - FOLLOW UP  Pharmacy Consult for vancomycin Indication: septic joint vs osteonecrosis  Allergies  Allergen Reactions  . Tylenol [Acetaminophen] Nausea Only    Patient Measurements: Height:  (170.2 cm) Weight: 153 lb 10.6 oz (69.7 kg) (scale A) IBW/kg (Calculated) : 66.1  Vital Signs: Temp: 98 F (36.7 C) (01/03 1426) Temp Source: Oral (01/03 1426) BP: 128/82 mmHg (01/03 1426) Pulse Rate: 91 (01/03 1426) Intake/Output from previous day: 01/02 0701 - 01/03 0700 In: 360 [P.O.:360] Out: 1 [Stool:1] Intake/Output from this shift:    Labs:  Recent Labs  05/10/14 0313 05/12/14 1000  WBC 16.4* 15.7*  HGB 8.2* 8.6*  PLT 458* 491*  CREATININE 3.32* 4.32*   Estimated Creatinine Clearance: 18.5 mL/min (by C-G formula based on Cr of 4.32). No results for input(s): VANCOTROUGH, VANCOPEAK, VANCORANDOM, GENTTROUGH, GENTPEAK, GENTRANDOM, TOBRATROUGH, TOBRAPEAK, TOBRARND, AMIKACINPEAK, AMIKACINTROU, AMIKACIN in the last 72 hours.   Microbiology: Recent Results (from the past 720 hour(s))  MRSA PCR Screening     Status: None   Collection Time: 05/07/14  6:41 PM  Result Value Ref Range Status   MRSA by PCR NEGATIVE NEGATIVE Final    Comment:        The GeneXpert MRSA Assay (FDA approved for NASAL specimens only), is one component of a comprehensive MRSA colonization surveillance program. It is not intended to diagnose MRSA infection nor to guide or monitor treatment for MRSA infections.   Culture, blood (routine x 2)     Status: None (Preliminary result)   Collection Time: 05/07/14  9:30 PM  Result Value Ref Range Status   Specimen Description BLOOD RIGHT ARM  Final   Special Requests BOTTLES DRAWN AEROBIC AND ANAEROBIC 10CC  Final   Culture   Final           BLOOD CULTURE RECEIVED NO GROWTH TO DATE CULTURE WILL BE HELD FOR 5 DAYS BEFORE ISSUING A FINAL NEGATIVE REPORT Performed at [facility]    Report Status PENDING  Incomplete   Culture, blood (routine x 2)     Status: None (Preliminary result)   Collection Time: 05/07/14  9:40 PM  Result Value Ref Range Status   Specimen Description BLOOD RIGHT HAND  Final   Special Requests BOTTLES DRAWN AEROBIC AND ANAEROBIC 5CC  Final   Culture   Final           BLOOD CULTURE RECEIVED NO GROWTH TO DATE CULTURE WILL BE HELD FOR 5 DAYS BEFORE ISSUING A FINAL NEGATIVE REPORT Performed at [facility]    Report Status PENDING  Incomplete  Anaerobic culture     Status: None (Preliminary result)   Collection Time: 05/08/14  6:38 PM  Result Value Ref Range Status   Specimen Description SYNOVIAL FLUID KNEE LEFT  Final   Special Requests SPECIMEN B  Final   Gram Stain   Final    MODERATE WBC PRESENT,BOTH PMN AND MONONUCLEAR NO ORGANISMS SEEN Performed at [facility]    Culture   Final    NO ANAEROBES ISOLATED; CULTURE IN PROGRESS FOR 5 DAYS Performed at [facility]    Report Status PENDING  Incomplete  Body fluid culture     Status: None (Preliminary result)   Collection Time: 05/08/14  6:38 PM  Result Value Ref Range Status   Specimen Description SYNOVIAL FLUID KNEE LEFT  Final   Special Requests SPECIMEN B  Final   Gram Stain   Final    MODERATE  WBC PRESENT,BOTH PMN AND MONONUCLEAR NO ORGANISMS SEEN Performed at [facility]    Culture   Final    NO GROWTH 2 DAYS Performed at [facility]    Report Status PENDING  Incomplete  Anaerobic culture     Status: None (Preliminary result)   Collection Time: 05/08/14  6:42 PM  Result Value Ref Range Status   Specimen Description SYNOVIAL KNEE LEFT  Final   Special Requests SPECIMEN C  Final   Gram Stain   Final    MODERATE WBC PRESENT,BOTH PMN AND MONONUCLEAR NO ORGANISMS SEEN Performed at [facility]    Culture   Final    NO ANAEROBES ISOLATED; CULTURE IN PROGRESS FOR 5 DAYS Performed at [facility]    Report Status PENDING  Incomplete  Body fluid  culture     Status: None (Preliminary result)   Collection Time: 05/08/14  6:42 PM  Result Value Ref Range Status   Specimen Description SYNOVIAL KNEE LEFT  Final   Special Requests SPECIMEN C  Final   Gram Stain   Final    MODERATE WBC PRESENT,BOTH PMN AND MONONUCLEAR NO ORGANISMS SEEN Performed at [facility]    Culture   Final    NO GROWTH 2 DAYS Performed at [facility]    Report Status PENDING  Incomplete  Anaerobic culture     Status: None (Preliminary result)   Collection Time: 05/08/14  6:44 PM  Result Value Ref Range Status   Specimen Description SYNOVIAL FLUID KNEE LEFT  Final   Special Requests SPECIMEN D  Final   Gram Stain   Final    MODERATE WBC PRESENT,BOTH PMN AND MONONUCLEAR NO ORGANISMS SEEN Performed at [facility]    Culture   Final    NO ANAEROBES ISOLATED; CULTURE IN PROGRESS FOR 5 DAYS Performed at [facility]    Report Status PENDING  Incomplete  Body fluid culture     Status: None (Preliminary result)   Collection Time: 05/08/14  6:44 PM  Result Value Ref Range Status   Specimen Description SYNOVIAL FLUID KNEE LEFT  Final   Special Requests SPECIMEN D  Final   Gram Stain   Final    MODERATE WBC PRESENT,BOTH PMN AND MONONUCLEAR NO ORGANISMS SEEN Performed at [facility]    Culture   Final    NO GROWTH 2 DAYS Performed at [facility]    Report Status PENDING  Incomplete  Anaerobic culture     Status: None (Preliminary result)   Collection Time: 05/08/14  6:55 PM  Result Value Ref Range Status   Specimen Description SYNOVIAL FLUID KNEE LEFT  Final   Special Requests SPECIMEN E  Final   Gram Stain   Final    MODERATE WBC PRESENT,BOTH PMN AND MONONUCLEAR NO ORGANISMS SEEN Performed at [facility]    Culture   Final    NO ANAEROBES ISOLATED; CULTURE IN PROGRESS FOR 5 DAYS Performed at [facility]    Report Status PENDING  Incomplete  Body fluid culture     Status:  None (Preliminary result)   Collection Time: 05/08/14  6:55 PM  Result Value Ref Range Status   Specimen Description SYNOVIAL FLUID KNEE LEFT  Final   Special Requests SPECIMEN E  Final   Gram Stain   Final    MODERATE WBC PRESENT,BOTH PMN AND MONONUCLEAR NO ORGANISMS SEEN Performed at [facility]  Final    NO GROWTH 2 DAYS Performed at [facility]    Report Status PENDING  Incomplete  Clostridium Difficile by PCR     Status: Abnormal   Collection Time: 05/11/14  7:30 PM  Result Value Ref Range Status   C difficile by pcr POSITIVE (A) NEGATIVE Final    Comment: CRITICAL RESULT CALLED TO, READ BACK BY AND VERIFIED WITH: L.HOWELL,RN 05/12/14  BY V.WILKINS     Anti-infectives    Start     Dose/Rate Route Frequency Ordered Stop   06/06/14 1000  vancomycin (VANCOCIN) 50 mg/mL oral solution 125 mg     125 mg Oral Every 3 DAYS 05/07/14 2048 06/21/14 0959   05/29/14 1000  vancomycin (VANCOCIN) 50 mg/mL oral solution 125 mg     125 mg Oral Every other day 05/07/14 2048 06/06/14 0959   05/22/14 1000  vancomycin (VANCOCIN) 50 mg/mL oral solution 125 mg     125 mg Oral Daily 05/07/14 2048 05/29/14 0959   05/14/14 2200  vancomycin (VANCOCIN) 50 mg/mL oral solution 125 mg     125 mg Oral 2 times daily 05/07/14 2048 05/21/14 2159   05/11/14 1200  vancomycin (VANCOCIN) IVPB 750 mg/150 ml premix     750 mg150 mL/hr over 60 Minutes Intravenous Every T-Th-Sa (Hemodialysis) 05/09/14 1506     05/09/14 1530  vancomycin (VANCOCIN) 1,250 mg in sodium chloride 0.9 % 250 mL IVPB     1,250 mg166.7 mL/hr over 90 Minutes Intravenous STAT 05/09/14 1520 05/09/14 1707   05/09/14 1515  vancomycin (VANCOCIN) 1,500 mg in sodium chloride 0.9 % 500 mL IVPB  Status:  Discontinued     1,500 mg250 mL/hr over 120 Minutes Intravenous STAT 05/09/14 1506 05/09/14 1520   05/07/14 2200  vancomycin (VANCOCIN) 50 mg/mL oral solution 125 mg     125 mg Oral 4 times daily 05/07/14 2048  05/14/14 2159      Assessment: 54 y/o male on day 5 vancomycin for left septic knee vs osteonecrosis. MRI neg for osteomyelitis. WBC came down to 15.7 today and patient is afebrile. Cultures are ngtd.  Goal of Therapy:  Pre-HD level 15-25 mcg/ml  Plan:  - Continue vancomycin 750 mg IV after HD TTS (back on schedule now) - Monitor culture data and clinical progress  [facility], [address].D., BCPS Clinical Pharmacist Pager: [phone] 05/12/2014 3:41 PM

## 2014-05-12 NOTE — Progress Notes (Signed)
Subjective: up eating, no c/o  Filed Vitals:   05/11/14 2334 05/12/14 0227 05/12/14 0404 05/12/14 0635  BP: 126/87  110/72   Pulse: 100  79 87  Temp: 97.4 F (36.3 C)  97.5 F (36.4 C)   TempSrc: Oral  Oral   Resp: 11  10   Height:      Weight:   69.8 kg (153 lb 14.1 oz)   SpO2: 100% 100% 100%    Exam: Alert, calm, no distress No jvd Chest clear bilat RRR no MRG Abd soft, NTND, +BS 1+ LLE pretib edema, L knee wrapped w drain Neuro is nf, Ox 3 LUA AVF patent  HD: TTS [place] 4h 64kg 400/A1.5 2/2.25 [place] Heparin 5000 LUA AVF Aranesp 160 ug/wk, no other meds        Assessment: 1. L knee pain/effusion - infectious vs osteonecrosis, s/p I&D by ortho, cx's negative to date. On emp abx. 2. ESRD on HD 3. HTN/volume - on MTP and dilt, weights are off is actually under dry wt at 62.5 kg this am. No vol excess 4. 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 Hx Cdif on po vanc now 7. Afib - cont on MTP and dilt  8. ETOH - on CIWA protocol  Plan -  Next HD Tuesday  Vinson Moselle MD (pgr) [phone]    [phone] 05/12/2014, 9:13 AM

## 2014-05-12 NOTE — Progress Notes (Signed)
Subjective: 4 Days Post-Op Procedure(s) (LRB): ARTHROSCOPY KNEE I & D (Left) Patient reports pain as moderate, however he states that the pain overall is progressively getting better.  Patient reports mild nausea.  No vomiting.  No lightheadedness/dizziness.  No cp or sob.  Tolerating diet.  Objective: Vital signs in last 24 hours: Temp:  [97.4 F (36.3 C)-98.8 F (37.1 C)] 97.6 F (36.4 C) (01/03 0754) Pulse Rate:  [79-100] 88 (01/03 0754) Resp:  [9-18] 14 (01/03 0754) BP: (110-146)/(67-94) 126/78 mmHg (01/03 0754) SpO2:  [97 %-100 %] 100 % (01/03 0754) Weight:  [68 kg (149 lb 14.6 oz)-71.2 kg (156 lb 15.5 oz)] 69.8 kg (153 lb 14.1 oz) (01/03 0404)  Intake/Output from previous day: 01/02 0701 - 01/03 0700 In: 360 [P.O.:360] Out: 1 [Stool:1] Intake/Output this shift:     Recent Labs  05/10/14 0313  HGB 8.2*    Recent Labs  05/10/14 0313  WBC 16.4*  RBC 2.93*  HCT 27.1*  PLT 458*    Recent Labs  05/10/14 0313  NA 134*  K 3.6  CL 95*  CO2 25  BUN 22  CREATININE 3.32*  GLUCOSE 103*  CALCIUM 7.9*   No results for input(s): LABPT, INR in the last 72 hours.  Neurologically intact Neurovascular intact Sensation intact distally Intact pulses distally Dorsiflexion/Plantar flexion intact Compartment soft  Mild bloody drainage noted on dressing Negative homans bilaterally  Assessment/Plan: 4 Days Post-Op Procedure(s) (LRB): ARTHROSCOPY KNEE I & D (Left) Advance diet Up with therapy  WBAT LLE Dry dressing change prn Will most likely consult ID pending knee cultures Continue plan per medicine  ANTON, M. LINDSEY 05/12/2014, 9:09 AM

## 2014-05-12 NOTE — Progress Notes (Signed)
Report called to [facility] on 3E.

## 2014-05-12 NOTE — Progress Notes (Signed)
Subjective: up eating, no c/o  Filed Vitals:   05/11/14 2334 05/12/14 0227 05/12/14 0404 05/12/14 0635  BP: 126/87  110/72   Pulse: 100  79 87  Temp: 97.4 F (36.3 C)  97.5 F (36.4 C)   TempSrc: Oral  Oral   Resp: 11  10   Height:      Weight:   69.8 kg (153 lb 14.1 oz)   SpO2: 100% 100% 100%    Exam: Alert, calm, no distress No jvd Chest clear bilat RRR no MRG Abd soft, NTND, +BS 1+ LLE pretib edema, L knee wrapped w drain Neuro is nf, Ox 3 LUA AVF patent  HD: TTS [facility] 4h 64kg 400/A1.5 2/2.25 [place] Heparin 5000 LUA AVF Aranesp 160 ug/wk, no other meds        Assessment: 1. L knee pain/effusion - infectious vs osteonecrosis, s/p I&D by ortho, cx's negative to date. On emp abx. 2. ESRD on HD 3. HTN/volume - on MTP and dilt, excessive wt gain in hospital as in the past. 4kg up today 4. 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 Hx Cdif on po vanc now 7. Afib - cont on MTP and dilt  8. ETOH - on CIWA protocol  Plan -  HD today, UF 3-4 kg as tolerated.   Vinson Moselle MD (pgr) [phone]    [phone] 05/11/2014, 9:13 AM

## 2014-05-13 LAB — ANAEROBIC CULTURE

## 2014-05-13 NOTE — Progress Notes (Signed)
[place] TEAM 1 - Stepdown/ICU TEAM Progress Note  [name] ZOX:[id number] DOB: [birth year] DOA: 05/07/2014 PCP: Wilmer Floor., MD  Admit HPI / Brief Narrative: 21 old WM PMHx multiple medical problems including chronic kidney disease on dialysis, systolic congestive heart failure, sleep apnea on CPAP, and chronic low back pain who reported for at least 3 weeks progressive left knee pain and swelling. He initially presented to [facility] where he underwent arthrocentesis in the emergency department with no growth on the cultures and no crystals. Symptoms apparently improved after that procedure was done but 2 days prior to presenting to this hospital he again developed pain and swelling of the left knee. In the emergency department at [facility] he was in atrial fibrillation with RVR with ventricular rates into the 150s. He reported he had run out of his Lopressor and had not taken this for several days. He had a repeat arthrocentesis done as well with 50,457 white cells but no bacteria. No fevers no chills. Orthopedic service at outside ER evaluated the patient and felt symptoms were inflammatory in nature. Patient otherwise was to follow-up with orthopedics physician as an outpatient. Because of his ongoing atrial flutter with RVR and need for dialysis patient was transferred to [facility]. In addition to the above history patient has a history C. difficile colitis and has been on Flagyl which he takes inconsistently. He was also supposed to be on oral vancomycin but unsure why he ran out. He reported continued issues with diarrhea. Patient reports has not been to dialysis in several days.  HPI/Subjective: Patient complains of ongoing left knee pain although there is slow improvement. He denies CP, SOB, cough, fevers, chills.    Assessment/Plan: Septic arthritis and effusion left knee; status post arthroscopy with irrigation'debridement and lavage (12/30) - MRI of left  knee knee; showed infarcts and subchondral destruction see results below.  -Fluid cultures showing no growth to date, s/p I&D on 05/08/2014 -Orthopedic surgery following  Acute respiratory failure with hypoxia / COPD / OSA -Multifactorial: Primarily suspect volume overload due to missed dialysis treatments -CPAP per respiratory -Resolved   CKD stage V requiring chronic dialysis(T/Th/Sat) - usual days are Tuesday Thursday Saturday -extra dialysis preoperatively on 12/30 since had missed several treatments prior to admission with net ultrafiltration of 4000 mL  -plan for HD in AM. Nephrology following.   Atrial fibrillation with RVR -Current rate controlled and maintaining sinus rhythm  - suspect initial tachycardia related to medication nonadherence  - cont Cardizem 180 mg daily  -Continue Lopressor 25 mg  BID  Diarrhea / C. difficile colitis due to noncompliance  -No further diarrhea since admission  -1/2 C. difficile positive -Continue vancomycin PO  Peripheral vascular disease -Likely explains left femur infarcts (see above)  ETOH abuse  -cont CIWA -no evidence of fluid withdrawal  Chronic systolic heart failure -Primary mode of treatment hemodialysis  - last echocardiogram in October 2015 with normal LV function and grade 1 diastolic dysfunction; at this juncture no indication to pursue repeat echo  Anemia -Hgb stable at around baseline of 8.8 to 9.4     Code Status: FULL Family Communication: no family present at time of exam Disposition Plan: Per orthopedic surgery    Consultants: Dr. Delano Metz (Nephrology) Dr. Teryl Lucy (Orthopedics)  Procedure/Significant Events: 12/30 MRI left knee without contrast;- Large complex knee joint effusion and Baker's cyst consistent with synovitis. -No evidence of osteomyelitis. - Multiple bone infarcts involving the distal femur and proximal  tibia. - subchondral collapse lateral femoral condyle anteriorly with  associated chondral irregularity.- Intact menisci and ligaments. 12/30 Left knee arthroscopy with irrigation and debridement and lavage for infection with extensive debridement, chondroplasty of the lateral femoral condyle, removal of loose body, medial and lateral meniscal debridement.   Culture 12/29 MRSA by PCR negative 12/29 blood right arm/hand NGTD 12/30 left knee synovial fluid x 4 NGTD 1/2 C. difficile positive by PCR  Antibiotics: IV vancomycin 12/29 at hemodialysis T/Th/Sat>> PO vancomycin 12/29>>  DVT prophylaxis: SCD   Devices    LINES / TUBES:  12/30 left knee Hemovac>> removed 1/1    Continuous Infusions: . sodium chloride 10 mL/hr at 05/08/14 1658    Objective: VITAL SIGNS: Temp: 98 F (36.7 C) (01/04 0643) Temp Source: Oral (01/04 0643) BP: 121/83 mmHg (01/04 1050) Pulse Rate: 86 (01/04 1050) SPO2; FIO2:   Intake/Output Summary (Last 24 hours) at 05/13/14 1436 Last data filed at 05/13/14 0941  Gross per 24 hour  Intake    720 ml  Output      0 ml  Net    720 ml     Exam: General: A/O 4, left knee pain rated 8 /10 (however just completed ambulating around room with walker), No acute respiratory distress Lungs: Clear to auscultation bilateral   Cardiovascular: Regular rhythm and rate without murmur gallop or rub normal S1 and S2 Abdomen: Nontender, nondistended, soft, bowel sounds positive, no rebound, no ascites, no appreciable mass Extremities: Ace wrap in place, appropriately tender with palpation; Ace wrap and dressing taken down mild bloody drainage appropriate for day 3  left knee; arthroscopy    Data Reviewed: Basic Metabolic Panel:  Recent Labs Lab 05/07/14 2130 05/08/14 0856 05/08/14 1733 05/09/14 0323 05/10/14 0313 05/12/14 1000  NA 132* 131* 136 137 134* 133*  K 4.0 4.5 3.4* 4.5 3.6 4.3  CL 97 94*  --  98 95* 94*  CO2 19 18*  --  28 25 28   GLUCOSE 155* 172* 112* 160* 103* 105*  BUN 55* 60*  --  25* 22 26*   CREATININE 9.29* 9.75*  --  4.57* 3.32* 4.32*  CALCIUM 8.0* 8.1*  --  7.6* 7.9* 8.5  MG  --  2.2  --   --   --  2.3  PHOS  --  11.3*  --   --  4.2  --    Liver Function Tests:  Recent Labs Lab 05/07/14 2130 05/08/14 0856 05/09/14 0323 05/10/14 0313 05/12/14 1000  AST 13 19 15   --  12  ALT 9 9 9   --  11  ALKPHOS 122* 135* 112  --  108  BILITOT 0.3 0.5 0.5  --  0.8  PROT 5.5* 6.1 5.7*  --  5.8*  ALBUMIN 1.9* 2.1* 2.0* 2.1* 2.1*   No results for input(s): LIPASE, AMYLASE in the last 168 hours. No results for input(s): AMMONIA in the last 168 hours. CBC:  Recent Labs Lab 05/07/14 2130 05/08/14 0856 05/08/14 1733 05/09/14 0323 05/10/14 0313 05/12/14 1000  WBC 14.3* 8.2  --  11.9* 16.4* 15.7*  NEUTROABS 14.0*  --   --   --   --  14.1*  HGB 7.3* 8.8* 8.8* 8.0* 8.2* 8.6*  HCT 23.3* 27.7* 26.0* 25.5* 27.1* 28.5*  MCV 88.9 88.8  --  90.7 92.5 94.4  PLT 487* 534*  --  452* 458* 491*   Cardiac Enzymes:  Recent Labs Lab 05/07/14 2130 05/08/14 0856  TROPONINI 0.04* 0.05*  BNP (last 3 results)  Recent Labs  02/09/14 1425  PROBNP >70000.0*   CBG: No results for input(s): GLUCAP in the last 168 hours.  Recent Results (from the past 240 hour(s))  MRSA PCR Screening     Status: None   Collection Time: 05/07/14  6:41 PM  Result Value Ref Range Status   MRSA by PCR NEGATIVE NEGATIVE Final    Comment:        The GeneXpert MRSA Assay (FDA approved for NASAL specimens only), is one component of a comprehensive MRSA colonization surveillance program. It is not intended to diagnose MRSA infection nor to guide or monitor treatment for MRSA infections.   Culture, blood (routine x 2)     Status: None (Preliminary result)   Collection Time: 05/07/14  9:30 PM  Result Value Ref Range Status   Specimen Description BLOOD RIGHT ARM  Final   Special Requests BOTTLES DRAWN AEROBIC AND ANAEROBIC 10CC  Final   Culture   Final           BLOOD CULTURE RECEIVED NO GROWTH TO  DATE CULTURE WILL BE HELD FOR 5 DAYS BEFORE ISSUING A FINAL NEGATIVE REPORT Performed at [facility]    Report Status PENDING  Incomplete  Culture, blood (routine x 2)     Status: None (Preliminary result)   Collection Time: 05/07/14  9:40 PM  Result Value Ref Range Status   Specimen Description BLOOD RIGHT HAND  Final   Special Requests BOTTLES DRAWN AEROBIC AND ANAEROBIC 5CC  Final   Culture   Final           BLOOD CULTURE RECEIVED NO GROWTH TO DATE CULTURE WILL BE HELD FOR 5 DAYS BEFORE ISSUING A FINAL NEGATIVE REPORT Performed at [facility]    Report Status PENDING  Incomplete  Anaerobic culture     Status: None   Collection Time: 05/08/14  6:38 PM  Result Value Ref Range Status   Specimen Description SYNOVIAL FLUID KNEE LEFT  Final   Special Requests SPECIMEN B  Final   Gram Stain   Final    MODERATE WBC PRESENT,BOTH PMN AND MONONUCLEAR NO ORGANISMS SEEN Performed at [facility]    Culture   Final    NO ANAEROBES ISOLATED Performed at [facility]    Report Status 05/13/2014 FINAL  Final  Body fluid culture     Status: None   Collection Time: 05/08/14  6:38 PM  Result Value Ref Range Status   Specimen Description SYNOVIAL FLUID KNEE LEFT  Final   Special Requests SPECIMEN B  Final   Gram Stain   Final    MODERATE WBC PRESENT,BOTH PMN AND MONONUCLEAR NO ORGANISMS SEEN Performed at [facility]    Culture   Final    NO GROWTH 3 DAYS Performed at [facility]    Report Status 05/12/2014 FINAL  Final  Anaerobic culture     Status: None   Collection Time: 05/08/14  6:42 PM  Result Value Ref Range Status   Specimen Description SYNOVIAL KNEE LEFT  Final   Special Requests SPECIMEN C  Final   Gram Stain   Final    MODERATE WBC PRESENT,BOTH PMN AND MONONUCLEAR NO ORGANISMS SEEN Performed at [facility]    Culture   Final    NO ANAEROBES ISOLATED Performed at [facility]    Report Status  05/13/2014 FINAL  Final  Body fluid culture     Status: None  Collection Time: 05/08/14  6:42 PM  Result Value Ref Range Status   Specimen Description SYNOVIAL KNEE LEFT  Final   Special Requests SPECIMEN C  Final   Gram Stain   Final    MODERATE WBC PRESENT,BOTH PMN AND MONONUCLEAR NO ORGANISMS SEEN Performed at [facility]    Culture   Final    NO GROWTH 3 DAYS Performed at [facility]    Report Status 05/12/2014 FINAL  Final  Anaerobic culture     Status: None   Collection Time: 05/08/14  6:44 PM  Result Value Ref Range Status   Specimen Description SYNOVIAL FLUID KNEE LEFT  Final   Special Requests SPECIMEN D  Final   Gram Stain   Final    MODERATE WBC PRESENT,BOTH PMN AND MONONUCLEAR NO ORGANISMS SEEN Performed at [facility]    Culture   Final    NO ANAEROBES ISOLATED Performed at [facility]    Report Status 05/13/2014 FINAL  Final  Body fluid culture     Status: None   Collection Time: 05/08/14  6:44 PM  Result Value Ref Range Status   Specimen Description SYNOVIAL FLUID KNEE LEFT  Final   Special Requests SPECIMEN D  Final   Gram Stain   Final    MODERATE WBC PRESENT,BOTH PMN AND MONONUCLEAR NO ORGANISMS SEEN Performed at [facility]    Culture   Final    NO GROWTH 3 DAYS Performed at [facility]    Report Status 05/12/2014 FINAL  Final  Anaerobic culture     Status: None   Collection Time: 05/08/14  6:55 PM  Result Value Ref Range Status   Specimen Description SYNOVIAL FLUID KNEE LEFT  Final   Special Requests SPECIMEN E  Final   Gram Stain   Final    MODERATE WBC PRESENT,BOTH PMN AND MONONUCLEAR NO ORGANISMS SEEN Performed at [facility]    Culture   Final    NO ANAEROBES ISOLATED Performed at [facility]    Report Status 05/13/2014 FINAL  Final  Body fluid culture     Status: None   Collection Time: 05/08/14  6:55 PM  Result Value Ref Range Status   Specimen Description  SYNOVIAL FLUID KNEE LEFT  Final   Special Requests SPECIMEN E  Final   Gram Stain   Final    MODERATE WBC PRESENT,BOTH PMN AND MONONUCLEAR NO ORGANISMS SEEN Performed at [facility]    Culture   Final    NO GROWTH 3 DAYS Performed at [facility]    Report Status 05/12/2014 FINAL  Final  Clostridium Difficile by PCR     Status: Abnormal   Collection Time: 05/11/14  7:30 PM  Result Value Ref Range Status   C difficile by pcr POSITIVE (A) NEGATIVE Final    Comment: CRITICAL RESULT CALLED TO, READ BACK BY AND VERIFIED WITH: L.HOWELL,RN 05/12/14 @1103  BY V.WILKINS      Studies:  Recent x-ray studies have been reviewed in detail by the Attending Physician  Scheduled Meds:  Scheduled Meds: . antiseptic oral rinse  7 mL Mouth Rinse BID  . budesonide-formoterol  2 puff Inhalation BID  . calcium acetate  667 mg Oral TID WC  . darbepoetin (ARANESP) injection - DIALYSIS  200 mcg Intravenous Q Thu-HD  . diltiazem  180 mg Oral Daily  . docusate sodium  100 mg Oral BID  . escitalopram  20 mg Oral Daily  .  folic acid  1 mg Oral Daily  . gabapentin  200 mg Oral BID  . guaiFENesin  600 mg Oral BID  . heparin  4,000 Units Dialysis Once in dialysis  . ipratropium  0.5 mg Nebulization TID  . metoprolol tartrate  25 mg Oral BID  . morphine  15 mg Oral Q12H  . multivitamin with minerals  1 tablet Oral Daily  . nicotine  7 mg Transdermal Daily  . roflumilast  500 mcg Oral Daily  . senna  1 tablet Oral BID  . sodium chloride  3 mL Intravenous Q12H  . sodium chloride  3 mL Intravenous Q12H  . thiamine  100 mg Oral Daily  . vancomycin  125 mg Oral QID   Followed by  . [START ON 05/14/2014] vancomycin  125 mg Oral BID   Followed by  . [START ON 05/22/2014] vancomycin  125 mg Oral Daily   Followed by  . [START ON 05/29/2014] vancomycin  125 mg Oral QODAY   Followed by  . [START ON 06/06/2014] vancomycin  125 mg Oral Q3 days  . vancomycin  750 mg Intravenous Q T,Th,Sa-HD     Time spent on care of this patient: 20 mins   Jeralyn Bennett , MD   [facility] Office  [phone] Pager - [phone]  On-Call/Text Page:      [email or web address]      password [email or web address]  If 7PM-7AM, please contact night-coverage [email or web address] Password [email or web address] 05/13/2014, 2:36 PM   LOS: 6 days

## 2014-05-13 NOTE — Progress Notes (Signed)
Patient ID: [name], male   DOB: 1960/09/11, 54 y.o.   MRN: [id number]     Subjective:  Patient reports pain as mild to moderate.  Patient in bed and in no acute distress.  Denies any CP or SOB  Objective:   VITALS:   Filed Vitals:   05/13/14 0643 05/13/14 0938 05/13/14 1050 05/13/14 1616  BP: 110/70  121/83 124/73  Pulse: 87  86 98  Temp: 98 F (36.7 C)   98 F (36.7 C)  TempSrc: Oral   Oral  Resp:    18  Height:      Weight: 71.9 kg (158 lb 8.2 oz)     SpO2: 93% 94%  91%    ABD soft Sensation intact distally Dorsiflexion/Plantar flexion intact Incision: dressing C/D/I and no drainage  Wounds clean and dry   Lab Results  Component Value Date   WBC 15.7* 05/12/2014   HGB 8.6* 05/12/2014   HCT 28.5* 05/12/2014   MCV 94.4 05/12/2014   PLT 491* 05/12/2014   BMET    Component Value Date/Time   NA 133* 05/12/2014 1000   K 4.3 05/12/2014 1000   CL 94* 05/12/2014 1000   CO2 28 05/12/2014 1000   GLUCOSE 105* 05/12/2014 1000   BUN 26* 05/12/2014 1000   CREATININE 4.32* 05/12/2014 1000   CALCIUM 8.5 05/12/2014 1000   GFRNONAA 14* 05/12/2014 1000   GFRAA 17* 05/12/2014 1000     Assessment/Plan: 5 Days Post-Op   Principal Problem:   Septic joint of left knee joint Active Problems:   CKD (chronic kidney disease) stage V requiring chronic dialysis   COPD (chronic obstructive pulmonary disease)   Peripheral vascular disease   ETOH abuse   Chronic systolic heart failure   OSA (obstructive sleep apnea)   Acute respiratory failure with hypoxia   Atrial fibrillation with RVR   Sepsis   Anemia   Diarrhea   Effusion of left knee   End-stage renal disease on hemodialysis   Left knee pain   Obstructive sleep apnea   Anemia due to other cause   C. difficile colitis   Alcohol abuse   Advance diet Up with therapy Continue plan per medicine Okay for DC per orthopaedics WBAT Follow up with Dr Dion Saucier in one week   BURCH, [place] 05/13/2014,  5:30 PM  Discussed and agree.  It is hard to know whether or not he truly had a septic knee, and the etiology of his elevated sedimentation rate certainly may have been multifactorial given his multiple other medical problems and his C. difficile colitis and colonization. I do not feel strongly that he needs IV antibiotics, but would defer to the primary team and the infectious disease recommendations. He is okay for discharge from my standpoint, and I will follow his knee closely. I would expect multiple recurrent effusions, as well as ongoing pain given the distructive chondral process seen at the time of surgery. He effectively has a dysvascular femoral condyle with necrosis and destruction of the cartilaginous surface of the lateral femoral condyle and the femoral trochlea. The hard question is going to be if we cannot manage his pain adequately, is he a candidate for knee arthroplasty, which could be a potentially disastrous process. We will have to see how he responds clinically over time.  Teryl Lucy, MD Cell [phone]

## 2014-05-13 NOTE — Progress Notes (Signed)
S:CO Lt knee pain O:BP 121/83 mmHg  Pulse 86  Temp(Src) 98 F (36.7 C) (Oral)  Resp 18  Ht  (1.702 m)  Wt 71.9 kg (158 lb 8.2 oz)  BMI 24.82 kg/m2  SpO2 94%  Intake/Output Summary (Last 24 hours) at 05/13/14 1052 Last data filed at 05/13/14 0941  Gross per 24 hour  Intake    720 ml  Output      0 ml  Net    720 ml   Weight change: -8.83 kg (-19 lb 7.5 oz) ZOX:WRUEA and alert CVS:RRR Resp:clear Abd:+ BS NTND Ext: Lt knee wrapped.  Lt AVF + bruit NEURO:CNI Ox3 no asterixis   . antiseptic oral rinse  7 mL Mouth Rinse BID  . budesonide-formoterol  2 puff Inhalation BID  . calcium acetate  667 mg Oral TID WC  . darbepoetin (ARANESP) injection - DIALYSIS  200 mcg Intravenous Q Thu-HD  . diltiazem  180 mg Oral Daily  . docusate sodium  100 mg Oral BID  . escitalopram  20 mg Oral Daily  . folic acid  1 mg Oral Daily  . gabapentin  200 mg Oral BID  . guaiFENesin  600 mg Oral BID  . heparin  4,000 Units Dialysis Once in dialysis  . ipratropium  0.5 mg Nebulization TID  . metoprolol tartrate  25 mg Oral BID  . morphine  15 mg Oral Q12H  . multivitamin with minerals  1 tablet Oral Daily  . nicotine  7 mg Transdermal Daily  . roflumilast  500 mcg Oral Daily  . senna  1 tablet Oral BID  . sodium chloride  3 mL Intravenous Q12H  . sodium chloride  3 mL Intravenous Q12H  . thiamine  100 mg Oral Daily  . vancomycin  125 mg Oral QID   Followed by  . [START ON 05/14/2014] vancomycin  125 mg Oral BID   Followed by  . [START ON 05/22/2014] vancomycin  125 mg Oral Daily   Followed by  . [START ON 05/29/2014] vancomycin  125 mg Oral QODAY   Followed by  . [START ON 06/06/2014] vancomycin  125 mg Oral Q3 days  . vancomycin  750 mg Intravenous Q T,Th,Sa-HD   No results found. BMET    Component Value Date/Time   NA 133* 05/12/2014 1000   K 4.3 05/12/2014 1000   CL 94* 05/12/2014 1000   CO2 28 05/12/2014 1000   GLUCOSE 105* 05/12/2014 1000   BUN 26* 05/12/2014 1000   CREATININE 4.32* 05/12/2014 1000   CALCIUM 8.5 05/12/2014 1000   GFRNONAA 14* 05/12/2014 1000   GFRAA 17* 05/12/2014 1000   CBC    Component Value Date/Time   WBC 15.7* 05/12/2014 1000   RBC 3.02* 05/12/2014 1000   RBC 2.62* 05/07/2014 2130   HGB 8.6* 05/12/2014 1000   HCT 28.5* 05/12/2014 1000   PLT 491* 05/12/2014 1000   MCV 94.4 05/12/2014 1000   MCH 28.5 05/12/2014 1000   MCHC 30.2 05/12/2014 1000   RDW 16.4* 05/12/2014 1000   LYMPHSABS 0.7 05/12/2014 1000   MONOABS 0.7 05/12/2014 1000   EOSABS 0.2 05/12/2014 1000   BASOSABS 0.0 05/12/2014 1000     Assessment: 1. Lt knee effusion. Cult neg to date 2. HTN 3. Anemia on aranesp 4.+ C Diff  Plan: 1. Plan HD in AM  Edwin Baines T

## 2014-05-13 NOTE — Clinical Social Work Psychosocial (Addendum)
Clinical Social Work Department BRIEF PSYCHOSOCIAL ASSESSMENT 05/13/2014  Patient:  [name], [name]     Account Number:  [id number]     Admit date:  05/07/2014  Clinical Social Worker:  Elam Dutch  Date/Time:  05/13/2014 01:55 PM  Referred by:  Physician  Date Referred:  05/12/2014 Referred for  SNF Placement   Other Referral:   Interview type:  Patient Other interview type:    PSYCHOSOCIAL DATA Living Status:  ALONE Admitted from facility:   Level of care:   Primary support name:  Mamie Laurel  [phone] Primary support relationship to patient:  Sister Degree of support available:   Sister  Mother is also supportive.  Son is in jail.    CURRENT CONCERNS Current Concerns  Post-Acute Placement   Other Concerns:    SOCIAL WORK ASSESSMENT / PLAN 54 year old male referred to [facility] for short term SNF placement for need of IV antibiotics and PT due to a septic left knee joint.  CSW met with patient- he states that he normally lives alone but had recently went to stay with his mother. He is unable to return to the arrangement due to problems with her landlord (she is in section 8 housing and cannot have anyone live with her.) CSW discussed recommendation for short term SNF and patient is in agreement. He does not feel that he can manage safely at home.  Patient lives in [place] and requests [facility] if possible as he recently had a friend who stayed there.  He also receives dialysis 3 times a week at [facility] in [place].  Fl2 will be completed and and placed on chart for MD's signature. Active bed search will be initiated.   Assessment/plan status:  Psychosocial Support/Ongoing Assessment of Needs Other assessment/ plan:   Information/referral to community resources:   SNF list provided to patient    PATIENT'S/FAMILY'S RESPONSE TO PLAN OF CARE: Patient is alert, oriented and very pleasant. He states that he is willing to seek  placement and admits to being very lonely living in his house alone. His wife died 49 years ago and he states he still grieves for her.  Patient states that he feels depressed at times but denies any current suicidal or homicidal ideations.  "I don't want to be alone for the rest of me life."  He states that his mother and sister are supportive and he plans for his "adopted" son and his wife to come and live with him very soon. They can help with finances as well as any caregiving needs that he has. He agrees to go to the SNF but states he cannot remain beyond this month as his entire check is required at home to pay his mortgage and other bills.  Will discuss with CSW supervisor- possible need for a LOG with [facility] and he will not stay beyond 30 days.

## 2014-05-13 NOTE — Progress Notes (Signed)
Pt. Refused cpap. 

## 2014-05-13 NOTE — Progress Notes (Signed)
Physical Therapy Treatment Patient Details Name: [name] MRN: [id number] DOB: Dec 09, 1960 Today's Date: 05/13/2014    History of Present Illness This 54 y.o. male transferred from [facility] with Lt. knee infection vs. inflammation, A-flutter with RVR and ESRD.  Pt underwent Lt knee arthroscopy with I&D and lavage for infection and extensive debridement, chondroplasty of the lateral femoral condyle, removal of loose body, medial and lateral meniscal debridement 12/30.  PMH:  HTN; systolic HF; hyperlipidemia, COPD, PVD; OSA uses CPAP; ETOH abuse; arhritis; chronic LBP; Depression, Hepatitis    PT Comments    Patient progressing well with mobility.  Ambulating in room with close supervision.  Continues to require some assist for positioning and daily tasks. Discussed techniques for improving range and for pain management. Will continue to see and progress as tolerated.   Follow Up Recommendations  SNF;Supervision/Assistance - 24 hour     Equipment Recommendations  Rolling walker with 5" wheels    Recommendations for Other Services       Precautions / Restrictions Precautions Precautions: Fall Restrictions Weight Bearing Restrictions: Yes LLE Weight Bearing: Weight bearing as tolerated    Mobility  Bed Mobility Overal bed mobility: Needs Assistance Bed Mobility: Supine to Sit     Supine to sit: Supervision     General bed mobility comments: HOB elevated, patient used hands to support LLE and move it to the side of the bed, no physical assist required  Transfers Overall transfer level: Needs assistance Equipment used: Rolling walker (2 wheeled) Transfers: Sit to/from Stand Sit to Stand: Min guard         General transfer comment: VCs for hand placement and safety  Ambulation/Gait Ambulation/Gait assistance: Supervision Ambulation Distance (Feet): 50 Feet (in room amb with 2 seated rest breaks for activity tolerance) Assistive device: Rolling walker (2  wheeled) Gait Pattern/deviations: Step-to pattern;Decreased weight shift to left;Antalgic;Narrow base of support Gait velocity: decreased Gait velocity interpretation: Below normal speed for age/gender General Gait Details: heavy reliance on RW, improved ability to bear weight through LEs    Stairs            Wheelchair Mobility    Modified Rankin (Stroke Patients Only)       Balance     Sitting balance-Leahy Scale: Good     Standing balance support: Bilateral upper extremity supported Standing balance-Leahy Scale: Fair Standing balance comment: reliant on UE support                     Cognition Arousal/Alertness: Awake/alert Behavior During Therapy: Impulsive Overall Cognitive Status: Within Functional Limits for tasks assessed                      Exercises      General Comments        Pertinent Vitals/Pain Pain Assessment: 0-10 Pain Score: 8  Pain Location: left knee Pain Intervention(s): Limited activity within patient's tolerance;Monitored during session;Repositioned    Home Living                      Prior Function            PT Goals (current goals can now be found in the care plan section) Acute Rehab PT Goals Patient Stated Goal: less pain PT Goal Formulation: With patient Time For Goal Achievement: 05/23/14 Potential to Achieve Goals: Good Progress towards PT goals: Progressing toward goals    Frequency  Min 3X/week    PT  Plan Current plan remains appropriate    Co-evaluation             End of Session   Activity Tolerance: Patient tolerated treatment well;Patient limited by pain Patient left: in bed;with call bell/phone within reach     Time: 5621-3086 PT Time Calculation (min) (ACUTE ONLY): 16 min  Charges:  $Gait Training: 8-22 mins                    G CodesFabio Asa 06-12-2014, 2:53 PM Charlotte Crumb, PT DPT  [phone]

## 2014-05-14 ENCOUNTER — Inpatient Hospital Stay (HOSPITAL_COMMUNITY): Payer: Medicaid Other

## 2014-05-14 LAB — BASIC METABOLIC PANEL
Anion gap: 12 (ref 5–15)
BUN: 49 mg/dL — ABNORMAL HIGH (ref 6–23)
CALCIUM: 9 mg/dL (ref 8.4–10.5)
CO2: 26 mmol/L (ref 19–32)
Chloride: 91 mEq/L — ABNORMAL LOW (ref 96–112)
Creatinine, Ser: 6.74 mg/dL — ABNORMAL HIGH (ref 0.50–1.35)
GFR calc Af Amer: 10 mL/min — ABNORMAL LOW (ref >90)
GFR calc non Af Amer: 8 mL/min — ABNORMAL LOW (ref >90)
GLUCOSE: 111 mg/dL — AB (ref 70–99)
Potassium: 5.5 mmol/L — ABNORMAL HIGH (ref 3.5–5.1)
Sodium: 129 mmol/L — ABNORMAL LOW (ref 135–145)

## 2014-05-14 LAB — CBC
HCT: 29.8 % — ABNORMAL LOW (ref 39.0–52.0)
HCT: 27.5 % — ABNORMAL LOW (ref 39.0–52.0)
Hemoglobin: 9 g/dL — ABNORMAL LOW (ref 13.0–17.0)
Hemoglobin: 8.4 g/dL — ABNORMAL LOW (ref 13.0–17.0)
MCH: 29.3 pg (ref 26.0–34.0)
MCH: 29.4 pg (ref 26.0–34.0)
MCHC: 30.2 g/dL (ref 30.0–36.0)
MCHC: 30.5 g/dL (ref 30.0–36.0)
MCV: 97.1 fL (ref 78.0–100.0)
MCV: 96.2 fL (ref 78.0–100.0)
Platelets: 472 10*3/uL — ABNORMAL HIGH (ref 150–400)
Platelets: 508 10*3/uL — ABNORMAL HIGH (ref 150–400)
RBC: 3.07 MIL/uL — ABNORMAL LOW (ref 4.22–5.81)
RBC: 2.86 MIL/uL — AB (ref 4.22–5.81)
RDW: 16.7 % — ABNORMAL HIGH (ref 11.5–15.5)
RDW: 16.4 % — ABNORMAL HIGH (ref 11.5–15.5)
WBC: 13.9 10*3/uL — ABNORMAL HIGH (ref 4.0–10.5)
WBC: 13.2 10*3/uL — AB (ref 4.0–10.5)

## 2014-05-14 LAB — RENAL FUNCTION PANEL
ALBUMIN: 2.3 g/dL — AB (ref 3.5–5.2)
ANION GAP: 13 (ref 5–15)
BUN: 53 mg/dL — AB (ref 6–23)
CHLORIDE: 87 meq/L — AB (ref 96–112)
CO2: 28 mmol/L (ref 19–32)
CREATININE: 7.25 mg/dL — AB (ref 0.50–1.35)
Calcium: 8.7 mg/dL (ref 8.4–10.5)
GFR calc Af Amer: 9 mL/min — ABNORMAL LOW (ref >90)
GFR calc non Af Amer: 8 mL/min — ABNORMAL LOW (ref >90)
GLUCOSE: 108 mg/dL — AB (ref 70–99)
POTASSIUM: 5.3 mmol/L — AB (ref 3.5–5.1)
Phosphorus: 6.8 mg/dL — ABNORMAL HIGH (ref 2.3–4.6)
Sodium: 128 mmol/L — ABNORMAL LOW (ref 135–145)

## 2014-05-14 LAB — CULTURE, BLOOD (ROUTINE X 2)
CULTURE: NO GROWTH
Culture: NO GROWTH

## 2014-05-14 MED ORDER — VANCOMYCIN 50 MG/ML ORAL SOLUTION: 125.0000 mg | ORAL | Status: AC

## 2014-05-14 MED ORDER — CALCIUM ACETATE 667 MG PO CAPS: 667.0000 mg | ORAL_CAPSULE | Freq: Three times a day (TID) | ORAL | Status: AC

## 2014-05-14 MED ORDER — CALCIUM CARBONATE ANTACID 500 MG PO CHEW
1.0000 | CHEWABLE_TABLET | Freq: Three times a day (TID) | ORAL | Status: DC | PRN
  Administered 2014-05-14: 200 mg via ORAL
  Filled 2014-05-14 (×2): qty 1

## 2014-05-14 MED ORDER — VANCOMYCIN 50 MG/ML ORAL SOLUTION: 125.0000 mg | Freq: Every day | ORAL | Status: AC

## 2014-05-14 MED ORDER — VANCOMYCIN HCL IN DEXTROSE 750-5 MG/150ML-% IV SOLN: 750.0000 mg | INTRAVENOUS | Status: AC

## 2014-05-14 MED ORDER — MORPHINE SULFATE ER 15 MG PO TBCR: 15.0000 mg | EXTENDED_RELEASE_TABLET | Freq: Two times a day (BID) | ORAL | Status: DC

## 2014-05-14 MED ORDER — SENNA 8.6 MG PO TABS: 1.0000 | ORAL_TABLET | Freq: Two times a day (BID) | ORAL | Status: AC

## 2014-05-14 MED ORDER — ALPRAZOLAM 0.5 MG PO TABS
0.5000 mg | ORAL_TABLET | Freq: Three times a day (TID) | ORAL | Status: DC | PRN
Start: 2014-05-14 — End: 2014-05-18

## 2014-05-14 MED ORDER — VANCOMYCIN 50 MG/ML ORAL SOLUTION: 125.0000 mg | Freq: Two times a day (BID) | ORAL | Status: AC

## 2014-05-14 MED ORDER — OXYCODONE HCL 5 MG PO TABS: 5.0000 mg | ORAL_TABLET | Freq: Four times a day (QID) | ORAL | Status: DC | PRN

## 2014-05-14 MED ORDER — VANCOMYCIN 50 MG/ML ORAL SOLUTION: 125.0000 mg | Freq: Four times a day (QID) | ORAL | Status: AC

## 2014-05-14 MED ORDER — ONDANSETRON HCL 4 MG/2ML IJ SOLN
INTRAMUSCULAR | Status: AC
  Filled 2014-05-14: qty 2

## 2014-05-14 MED ORDER — CALCIUM CARBONATE ANTACID 500 MG PO CHEW: 1.0000 | CHEWABLE_TABLET | Freq: Three times a day (TID) | ORAL | Status: AC | PRN

## 2014-05-14 NOTE — Clinical Social Work Placement (Signed)
     Clinical Social Work Department CLINICAL SOCIAL WORK PLACEMENT NOTE 05/14/2014  Patient:  [name],[name]  Account Number:  [id number] Admit date:  05/07/2014  Clinical Social Worker:  Lupita LeashNNA Cecylia Brazill, LCSW  Date/time:  05/13/2014 02:15 PM  Clinical Social Work is seeking post-discharge placement for this patient at the following level of care:   SKILLED NURSING   (*CSW will update this form in Epic as items are completed)   05/13/2014  [facility]/family provided with [facility] Department of Clinical Social Works list of facilities offering this level of care within the geographic area requested by the patient (or if unable, by the patients family).  05/13/2014  Patient/family informed of their freedom to choose among providers that offer the needed level of care, that participate in Medicare, Medicaid or managed care program needed by the patient, have an available bed and are willing to accept the patient.  05/13/2014  Patient/family informed of [facility] ownership interest in [facility], as well as of the fact that they are under no obligation to receive care at this facility.  PASARR submitted to EDS on 05/13/2014 PASARR number received on 05/14/2014  FL2 transmitted to all facilities in geographic area requested by pt/family on  05/13/2014 FL2 transmitted to all facilities within larger geographic area on   Patient informed that his/her managed care company has contracts with or will negotiate with  certain facilities, including the following:   Medicaid.  ? Letter of Guarantee required?     Patient/family informed of bed offers received:  05/14/2014 [facility] Physician recommends and patient chooses bed at    [facility]  05/[facility] Patient to be transferred to [facility]) Patient and family notified of transfer on 05/14/2014 Name of family member notified:     The following physician request were entered in Epic: Physician Request  Please prepare priority discharge summary and prescriptions.  Please sign FL2.    Additional Comments: 05/14/14  Ok for d/c today to SNF per MD after dialysis treatment. Will not require PICC line as he will receive antibiotics during his dialysis per MD. Patient is agreeable to d/c plan. Notiied his sister Cleo of d/c plan and she is also pleased.  Nursing notified to call report. CSW signing off.  Lorri Frederickonna T. Seger Jani, LCSW  [phone]

## 2014-05-14 NOTE — Progress Notes (Signed)
[facility].  Pt to go after dialysis is complete today.

## 2014-05-14 NOTE — Discharge Summary (Signed)
Physician Discharge Summary  [name] UEA:[id number] DOB: May 15, 1960 DOA: 05/07/2014  PCP: Wilmer Floor., MD  Admit date: 05/07/2014 Discharge date: 05/14/2014  Time spent: 45 minutes  Recommendations for Outpatient Follow-up:  1. I spoke with ID, recommended 6 weeks of IV Vancomycin 750 mg with HD, check a predialysis vancomycin level on Saturday 05/18/2014.  2. Please follow oral vancomycin taper for C Diff. Vancomycin 125 mg PO TID for 1 week, followed by 125 mg PO BID for 1 week followed by 125 mg PO q daily for 1 week, followed by 125 mg PO every other day for 1 week, follwed by 125 mg Po every 3 days for 1 week then stop.  3. Will need to follow up with Dr Daiva Eves in 2 weeks.   Discharge Diagnoses:  Principal Problem:   Septic joint of left knee joint Active Problems:   CKD (chronic kidney disease) stage V requiring chronic dialysis   COPD (chronic obstructive pulmonary disease)   Peripheral vascular disease   ETOH abuse   Chronic systolic heart failure   OSA (obstructive sleep apnea)   Acute respiratory failure with hypoxia   Atrial fibrillation with RVR   Sepsis   Anemia   Diarrhea   Effusion of left knee   End-stage renal disease on hemodialysis   Left knee pain   Obstructive sleep apnea   Anemia due to other cause   C. difficile colitis   Alcohol abuse   Discharge Condition: Stable  Diet recommendation: Renal Diet  Filed Weights   05/12/14 1426 05/13/14 0643 05/14/14 0528  Weight: 69.7 kg (153 lb 10.6 oz) 71.9 kg (158 lb 8.2 oz) 72.077 kg (158 lb 14.4 oz)    History of present illness:  Patient with with history of end-stage renal disease on dialysis Tuesday first day and Saturday . History of chronic leg pain of unclear etiology for the past 3 weeks he been having worsening left knee pain. Patient state he woke up with a swollen knee. 3 weeks ago he had an arthrocentesis performed in emergency department per [facility] no growth on  culture, no crystals was noted. His left knee pain has returned 2 days ago and he presented to emergency department again. He was found to be in A. fib with RVR up to 150. Of note patient has run out of his Lopressor hasn't been taking it for the past few days. At [facility] he had had an arthrocentesis performed as well a report showing 50,457 white blood cells, no bacteria seen. He has had some worsening swelling i the entire lower extremety today. NO fever no chills. Orthopedics was called and felt this is likely inflammatory. Patient remained tachycardiac despite 2 doses of Lopressor 5 mg IV. Cardiology consult was called And recommended initiation of Cardizem drip.  His labs was significant for white blood cell count of 14 hemoglobin of 7.3, potassium 3.9 and creatinine 8.4 chest x-ray was showing mild venous congestion. Patient was transferred to [facility] step down unit for further evaluation and treatment of his A flutter, given history of end-stage renal disease.  Of note patient had had history of C. Difficile reports being currently on flagyl that he takes inconsistently. He was supposed to be on vancomycin but unsure why ran out. He continues to have diarrhea.  He has history of alcohol abuse but states have not been drinking much. Has been on benzodiazepines for anxiety.  His Last HD was on Saturday. States for the  past 1 week he has not been getting full treatment due to machine malfunction. Access a fistula in Left arm.   Hospital Course:  54 old WM PMHx multiple medical problems including chronic kidney disease on dialysis, systolic congestive heart failure, sleep apnea on CPAP, and chronic low back pain who reported for at least 3 weeks progressive left knee pain and swelling. He initially presented to [facility] where he underwent arthrocentesis in the emergency department with no growth on the cultures and no crystals. Symptoms apparently improved after that procedure  was done but 2 days prior to presenting to this hospital he again developed pain and swelling of the left knee. In the emergency department at [facility] he was in atrial fibrillation with RVR with ventricular rates into the 150s. He reported he had run out of his Lopressor and had not taken this for several days. He had a repeat arthrocentesis done as well with 50,457 white cells but no bacteria. No fevers no chills. Orthopedic service at outside ER evaluated the patient and felt symptoms were inflammatory in nature. Patient otherwise was to follow-up with orthopedics physician as an outpatient. Because of his ongoing atrial flutter with RVR and need for dialysis patient was transferred to [facility]. In addition to the above history patient has a history C. difficile colitis and has been on Flagyl which he takes inconsistently. He was also supposed to be on oral vancomycin but unsure why he ran out. He reported continued issues with diarrhea. Patient reports has not been to dialysis in several days.  Septic arthritis and effusion left knee; status post arthroscopy with irrigation'debridement and lavage (12/30) - MRI of left knee knee; showed infarcts and subchondral destruction  -Fluid cultures showing no growth to date, s/p I&D on 05/08/2014 -I spoke with Dr Daiva Eves of ID who recommended 6 weeks of IV Vancomycin -Prior to discharge PICC line placed.  -Will follow up with ID in 2 weeks.   Acute respiratory failure with hypoxia / COPD / OSA -Multifactorial: Primarily suspect volume overload due to missed dialysis treatments -CPAP per respiratory -Resolved   CKD stage V requiring chronic dialysis(T/Th/Sat) - usual days are Tuesday Thursday Saturday -extra dialysis preoperatively on 12/30 since had missed several treatments prior to admission with net ultrafiltration of 4000 mL  -plan for HD in AM. Nephrology following.   Atrial fibrillation with RVR -Current rate controlled and  maintaining sinus rhythm  - suspect initial tachycardia related to medication nonadherence  - cont Cardizem 180 mg daily  -Continue Lopressor 25 mg BID  Diarrhea / C. difficile colitis due to noncompliance  -No further diarrhea since admission  -1/2 C. difficile positive -Continue vancomycin PO taper.   Peripheral vascular disease -Likely explains left femur infarcts   ETOH abuse  -cont CIWA -no evidence of fluid withdrawal  Chronic systolic heart failure -Primary mode of treatment hemodialysis  - last echocardiogram in October 2015 with normal LV function and grade 1 diastolic dysfunction; at this juncture no indication to pursue repeat echo  Procedures: Left knee arthroscopy with irrigation and debridement and lavage for infection with extensive debridement, chondroplasty of the lateral femoral condyle, removal of loose body, medial and lateral meniscal debridement.  Consultations:  Orthopedic Surgery  Discharge Exam: Filed Vitals:   05/14/14 0800  BP:   Pulse: 89  Temp:   Resp:     General: A/O 4, left knee pain rated 8 /10 (however just completed ambulating around room with walker), No acute respiratory  distress  Lungs: Clear to auscultation bilateral     Cardiovascular: Regular rhythm and rate without murmur gallop or rub normal S1 and S2  Abdomen: Nontender, nondistended, soft, bowel sounds positive, no rebound, no ascites, no appreciable mass  Extremities: Ace wrap in place, appropriately tender with palpation; Ace wrap and dressing taken down mild bloody drainage appropriate for day 3  left knee; arthroscopy  Discharge Instructions   Discharge Instructions    Call MD for:  difficulty breathing, headache or visual disturbances    Complete by:  As directed      Call MD for:  extreme fatigue    Complete by:  As directed      Call MD for:  hives    Complete by:  As directed      Call MD for:  persistant dizziness or light-headedness    Complete by:   As directed      Call MD for:  persistant nausea and vomiting    Complete by:  As directed      Call MD for:  redness, tenderness, or signs of infection (pain, swelling, redness, odor or green/yellow discharge around incision site)    Complete by:  As directed      Call MD for:  severe uncontrolled pain    Complete by:  As directed      Call MD for:  temperature >100.4    Complete by:  As directed      Diet - low sodium heart healthy    Complete by:  As directed      Increase activity slowly    Complete by:  As directed           Current Discharge Medication List    START taking these medications   Details  senna (SENOKOT) 8.6 MG TABS tablet Take 1 tablet (8.6 mg total) by mouth 2 (two) times daily. Qty: 120 each, Refills: 0    Vancomycin (VANCOCIN) 750 MG/150ML SOLN Inject 150 mLs (750 mg total) into the vein Every Tuesday,Thursday,and Saturday with dialysis. Qty: 150 mL, Refills: 0      CONTINUE these medications which have CHANGED   Details  ALPRAZolam (XANAX) 0.5 MG tablet Take 1 tablet (0.5 mg total) by mouth 3 (three) times daily as needed for anxiety. Qty: 20 tablet, Refills: 0    calcium acetate (PHOSLO) 667 MG capsule Take 1 capsule (667 mg total) by mouth 3 (three) times daily with meals. Qty: 90 capsule, Refills: 1    calcium carbonate (TUMS - DOSED IN MG ELEMENTAL CALCIUM) 500 MG chewable tablet Chew 1 tablet (200 mg of elemental calcium total) by mouth 3 (three) times daily as needed for indigestion or heartburn. Qty: 90 tablet, Refills: 1    morphine (MS CONTIN) 15 MG 12 hr tablet Take 1 tablet (15 mg total) by mouth every 12 (twelve) hours. Qty: 20 tablet, Refills: 0    oxyCODONE (OXY IR/ROXICODONE) 5 MG immediate release tablet Take 1 tablet (5 mg total) by mouth every 6 (six) hours as needed. Qty: 20 tablet, Refills: 0    !! vancomycin (VANCOCIN) 50 mg/mL oral solution Take 2.5 mLs (125 mg total) by mouth 4 (four) times daily. Qty: 70 mL, Refills: 0     !! vancomycin (VANCOCIN) 50 mg/mL oral solution Take 2.5 mLs (125 mg total) by mouth 2 (two) times daily. Qty: 35 mL, Refills: 0    !! vancomycin (VANCOCIN) 50 mg/mL oral solution Take 2.5 mLs (125 mg total) by mouth daily.  Qty: 17.5 mL, Refills: 0    !! vancomycin (VANCOCIN) 50 mg/mL oral solution Take 2.5 mLs (125 mg total) by mouth every other day. Qty: 10 mL, Refills: 0    !! vancomycin (VANCOCIN) 50 mg/mL oral solution Take 2.5 mLs (125 mg total) by mouth every 3 (three) days. Qty: 5 mL, Refills: 0     !! - Potential duplicate medications found. Please discuss with provider.    CONTINUE these medications which have NOT CHANGED   Details  albuterol (PROVENTIL HFA;VENTOLIN HFA) 108 (90 BASE) MCG/ACT inhaler Inhale 2 puffs into the lungs every 6 (six) hours as needed for wheezing or shortness of breath.    budesonide-formoterol (SYMBICORT) 80-4.5 MCG/ACT inhaler Inhale 2 puffs into the lungs 2 (two) times daily. Qty: 1 Inhaler, Refills: 1    diltiazem (CARDIZEM CD) 180 MG 24 hr capsule Take 1 capsule (180 mg total) by mouth daily. Qty: 60 capsule, Refills: 3    famotidine (PEPCID) 20 MG tablet Take 1 tablet (20 mg total) by mouth 2 (two) times daily. Qty: 60 tablet, Refills: 2    gabapentin (NEURONTIN) 100 MG capsule Take 2 capsules (200 mg total) by mouth 2 (two) times daily. Qty: 120 capsule, Refills: 0    metoprolol tartrate (LOPRESSOR) 25 MG tablet Take 1 tablet (25 mg total) by mouth 2 (two) times daily. Qty: 60 tablet, Refills: 2    roflumilast (DALIRESP) 500 MCG TABS tablet Take 500 mcg by mouth daily.    thiamine 100 MG tablet Take 1 tablet (100 mg total) by mouth daily. Qty: 30 tablet, Refills: 3    tiotropium (SPIRIVA) 18 MCG inhalation capsule Place 18 mcg into inhaler and inhale daily.    traZODone (DESYREL) 25 mg TABS tablet Take 0.5 tablets (25 mg total) by mouth at bedtime as needed for sleep. Qty: 30 tablet, Refills: 0    vitamin C (ASCORBIC ACID) 500 MG  tablet Take 500 mg by mouth daily.    aspirin EC 325 MG EC tablet Take 1 tablet (325 mg total) by mouth daily. Qty: 30 tablet, Refills: 2    escitalopram (LEXAPRO) 10 MG tablet Take 1 tablet (10 mg total) by mouth daily. Qty: 30 tablet, Refills: 2    ondansetron (ZOFRAN-ODT) 4 MG disintegrating tablet Take 4 mg by mouth every 6 (six) hours as needed for nausea.  Refills: 0      STOP taking these medications     diphenhydrAMINE (BENADRYL) 25 MG tablet      metroNIDAZOLE (FLAGYL) 500 MG tablet      nicotine (NICODERM CQ - DOSED IN MG/24 HR) 7 mg/24hr patch      naltrexone (DEPADE) 50 MG tablet        Allergies  Allergen Reactions  . Tylenol [Acetaminophen] Nausea Only   Follow-up Information    Follow up with Eulas PostLANDAU,JOSHUA P, MD. Schedule an appointment as soon as possible for a visit in 2 weeks.   Specialty:  Orthopedic Surgery   Contact information:   [address] [place] [address] [phone]       Follow up with Acey Lavornelius Van Dam, MD In 2 weeks.   Specialty:  Infectious Diseases   Contact information:   [address] [place] [address] [phone]        The results of significant diagnostics from this hospitalization (including imaging, microbiology, ancillary and laboratory) are listed below for reference.    Significant Diagnostic Studies: Mr Knee Left  Wo Contrast  05/08/2014   CLINICAL DATA:  Dialysis patient with joint effusion. Evaluate for infection versus inflammatory process.  EXAM: MRI OF THE LEFT KNEE WITHOUT CONTRAST  TECHNIQUE: Multiplanar, multisequence MR imaging of the knee was performed. No intravenous contrast was administered.  COMPARISON:  Lower extremity venous Doppler ultrasound 04/08/2014. Radiographs 05/08/2014.  FINDINGS: MENISCI  Medial meniscus: Minimal undersurface irregularity in the posterior horn without discrete tear or displaced meniscal fragment.  Lateral meniscus:  Intact  with normal morphology.  LIGAMENTS  Cruciates:  Intact.  Collaterals:  Intact.  CARTILAGE  Patellofemoral: There is mild patellar cartilage surface irregularity and thinning without full-thickness defect. There is lateral trochlear chondromalacia and subchondral collapse related to an underlying bony infarct, best seen on the sagittal images.  Medial:  Preserved.  Lateral:  Preserved.  Joint: There is a moderate to large complex knee joint effusion with diffuse synovial irregularity. There are multiple serpiginous filling defects within the suprapatellar recess. No discrete loose bodies observed.  Popliteal Fossa: There is a large complex Baker's cyst measuring up to 7.9 cm cephalocaudad and 4.7 cm transverse. There is subcutaneous edema surrounding the knee, greatest laterally. No other focal fluid collection is demonstrated.  Extensor Mechanism: Intact. There is mild edema within Hoffa's fat.  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 As above, there is some subchondral collapse of the lateral femoral condyle anteriorly. The bone infarct involving the posterior aspect of the medial femoral condyle is associated with surrounding marrow edema and may be subacute. There is no evidence of acute fracture or bone destruction.  IMPRESSION: 1. Large complex knee joint effusion and Baker's cyst consistent with synovitis. Joint aspiration recommended if there is clinical concern of infection. No evidence of osteomyelitis. 2. 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 There is subchondral collapse of the lateral femoral condyle anteriorly with associated chondral irregularity. 3. Intact menisci and ligaments.   Electronically Signed   By: Roxy Horseman M.D.   On: 05/08/2014 11:08   Dg Knee Complete 4 Views Left  05/08/2014   CLINICAL DATA:  54 year old male with left-sided knee pain. No known injury.  EXAM: LEFT KNEE - COMPLETE 4+ VIEW   COMPARISON:  No priors.  FINDINGS: In the lateral femoral condyle and supracondylar region there are irregular areas of sclerosis. The possibility of chondroid matrix is not excluded in these regions. The articular surface of the lateral femoral condyle appears intact without definite depression. There may be some overlying periosteal reaction. No displaced fracture, subluxation or dislocation.  IMPRESSION: 1. Large area of sclerosis in the lateral femoral condyle and supracondylar region. The overall appearance of this is favored to reflect a large bone infarct, however, the possibility of a chondroid lesion is not excluded, particularly in light of the potential periosteal reaction. Further characterization with nonemergent MRI of the knee with and without IV gadolinium in the near future is recommended.   Electronically Signed   By: Trudie Reed M.D.   On: 05/08/2014 01:00    Microbiology: Recent Results (from the past 240 hour(s))  MRSA PCR Screening     Status: None   Collection Time: 05/07/14  6:41 PM  Result Value Ref Range Status   MRSA by PCR NEGATIVE NEGATIVE Final    Comment:        The GeneXpert MRSA Assay (FDA approved for NASAL specimens only), is one component of a comprehensive MRSA colonization surveillance program. It is  not intended to diagnose MRSA infection nor to guide or monitor treatment for MRSA infections.   Culture, blood (routine x 2)     Status: None   Collection Time: 05/07/14  9:30 PM  Result Value Ref Range Status   Specimen Description BLOOD RIGHT ARM  Final   Special Requests BOTTLES DRAWN AEROBIC AND ANAEROBIC 10CC  Final   Culture   Final    NO GROWTH 5 DAYS Performed at [facility]    Report Status 05/14/2014 FINAL  Final  Culture, blood (routine x 2)     Status: None   Collection Time: 05/07/14  9:40 PM  Result Value Ref Range Status   Specimen Description BLOOD RIGHT HAND  Final   Special Requests BOTTLES DRAWN AEROBIC AND  ANAEROBIC 5CC  Final   Culture   Final    NO GROWTH 5 DAYS Performed at [facility]    Report Status 05/14/2014 FINAL  Final  Anaerobic culture     Status: None   Collection Time: 05/08/14  6:38 PM  Result Value Ref Range Status   Specimen Description SYNOVIAL FLUID KNEE LEFT  Final   Special Requests SPECIMEN B  Final   Gram Stain   Final    MODERATE WBC PRESENT,BOTH PMN AND MONONUCLEAR NO ORGANISMS SEEN Performed at [facility]    Culture   Final    NO ANAEROBES ISOLATED Performed at [facility]    Report Status 05/13/2014 FINAL  Final  Body fluid culture     Status: None   Collection Time: 05/08/14  6:38 PM  Result Value Ref Range Status   Specimen Description SYNOVIAL FLUID KNEE LEFT  Final   Special Requests SPECIMEN B  Final   Gram Stain   Final    MODERATE WBC PRESENT,BOTH PMN AND MONONUCLEAR NO ORGANISMS SEEN Performed at [facility]    Culture   Final    NO GROWTH 3 DAYS Performed at [facility]    Report Status 05/12/2014 FINAL  Final  Anaerobic culture     Status: None   Collection Time: 05/08/14  6:42 PM  Result Value Ref Range Status   Specimen Description SYNOVIAL KNEE LEFT  Final   Special Requests SPECIMEN C  Final   Gram Stain   Final    MODERATE WBC PRESENT,BOTH PMN AND MONONUCLEAR NO ORGANISMS SEEN Performed at [facility]    Culture   Final    NO ANAEROBES ISOLATED Performed at [facility]    Report Status 05/13/2014 FINAL  Final  Body fluid culture     Status: None   Collection Time: 05/08/14  6:42 PM  Result Value Ref Range Status   Specimen Description SYNOVIAL KNEE LEFT  Final   Special Requests SPECIMEN C  Final   Gram Stain   Final    MODERATE WBC PRESENT,BOTH PMN AND MONONUCLEAR NO ORGANISMS SEEN Performed at [facility]    Culture   Final    NO GROWTH 3 DAYS Performed at [facility]    Report Status 05/12/2014 FINAL  Final  Anaerobic culture      Status: None   Collection Time: 05/08/14  6:44 PM  Result Value Ref Range Status   Specimen Description SYNOVIAL FLUID KNEE LEFT  Final   Special Requests SPECIMEN D  Final   Gram Stain   Final    MODERATE WBC PRESENT,BOTH PMN AND MONONUCLEAR NO ORGANISMS SEEN Performed at [facility]    Culture   Final    NO ANAEROBES ISOLATED Performed at [facility]    Report Status 05/13/2014 FINAL  Final  Body fluid culture     Status: None   Collection Time: 05/08/14  6:44 PM  Result Value Ref Range Status   Specimen Description SYNOVIAL FLUID KNEE LEFT  Final   Special Requests SPECIMEN D  Final   Gram Stain   Final    MODERATE WBC PRESENT,BOTH PMN AND MONONUCLEAR NO ORGANISMS SEEN Performed at [facility]    Culture   Final    NO GROWTH 3 DAYS Performed at [facility]    Report Status 05/12/2014 FINAL  Final  Anaerobic culture     Status: None   Collection Time: 05/08/14  6:55 PM  Result Value Ref Range Status   Specimen Description SYNOVIAL FLUID KNEE LEFT  Final   Special Requests SPECIMEN E  Final   Gram Stain   Final    MODERATE WBC PRESENT,BOTH PMN AND MONONUCLEAR NO ORGANISMS SEEN Performed at [facility]    Culture   Final    NO ANAEROBES ISOLATED Performed at [facility]    Report Status 05/13/2014 FINAL  Final  Body fluid culture     Status: None   Collection Time: 05/08/14  6:55 PM  Result Value Ref Range Status   Specimen Description SYNOVIAL FLUID KNEE LEFT  Final   Special Requests SPECIMEN E  Final   Gram Stain   Final    MODERATE WBC PRESENT,BOTH PMN AND MONONUCLEAR NO ORGANISMS SEEN Performed at [facility]    Culture   Final    NO GROWTH 3 DAYS Performed at [facility]    Report Status 05/12/2014 FINAL  Final  Clostridium Difficile by PCR     Status: Abnormal   Collection Time: 05/11/14  7:30 PM  Result Value Ref Range Status   C difficile by pcr POSITIVE (A) NEGATIVE Final     Comment: CRITICAL RESULT CALLED TO, READ BACK BY AND VERIFIED WITH: L.HOWELL,RN 05/12/14 @1103  BY V.WILKINS      Labs: Basic Metabolic Panel:  Recent Labs Lab 05/08/14 0856 05/08/14 1733 05/09/14 0323 05/10/14 0313 05/12/14 1000 05/14/14 0409  NA 131* 136 137 134* 133* 129*  K 4.5 3.4* 4.5 3.6 4.3 5.5*  CL 94*  --  98 95* 94* 91*  CO2 18*  --  28 25 28 26   GLUCOSE 172* 112* 160* 103* 105* 111*  BUN 60*  --  25* 22 26* 49*  CREATININE 9.75*  --  4.57* 3.32* 4.32* 6.74*  CALCIUM 8.1*  --  7.6* 7.9* 8.5 9.0  MG 2.2  --   --   --  2.3  --   PHOS 11.3*  --   --  4.2  --   --    Liver Function Tests:  Recent Labs Lab 05/07/14 2130 05/08/14 0856 05/09/14 0323 05/10/14 0313 05/12/14 1000  AST 13 19 15   --  12  ALT 9 9 9   --  11  ALKPHOS 122* 135* 112  --  108  BILITOT 0.3 0.5 0.5  --  0.8  PROT 5.5* 6.1 5.7*  --  5.8*  ALBUMIN 1.9* 2.1* 2.0* 2.1* 2.1*   No results for input(s): LIPASE, AMYLASE in the last 168 hours. No results for input(s): AMMONIA in the last 168 hours. CBC:  Recent Labs Lab 05/07/14 2130 05/08/14 0856 05/08/14 1733 05/09/14  1610 05/10/14 0313 05/12/14 1000 05/14/14 0409  WBC 14.3* 8.2  --  11.9* 16.4* 15.7* 13.9*  NEUTROABS 14.0*  --   --   --   --  14.1*  --   HGB 7.3* 8.8* 8.8* 8.0* 8.2* 8.6* 9.0*  HCT 23.3* 27.7* 26.0* 25.5* 27.1* 28.5* 29.8*  MCV 88.9 88.8  --  90.7 92.5 94.4 97.1  PLT 487* 534*  --  452* 458* 491* 472*   Cardiac Enzymes:  Recent Labs Lab 05/07/14 2130 05/08/14 0856  TROPONINI 0.04* 0.05*   BNP: BNP (last 3 results)  Recent Labs  02/09/14 1425  PROBNP >70000.0*   CBG: No results for input(s): GLUCAP in the last 168 hours.     SignedJeralyn Bennett  [facility] 05/14/2014, 11:30 AM

## 2014-05-14 NOTE — Progress Notes (Signed)
S: Up walking some with walker.  He is agreeable to SNF O:BP 123/78 mmHg  Pulse 89  Temp(Src) 98.1 F (36.7 C) (Oral)  Resp 18  Ht 5\' 7"  (1.702 m)  Wt 72.077 kg (158 lb 14.4 oz)  BMI 24.88 kg/m2  SpO2 96%  Intake/Output Summary (Last 24 hours) at 05/14/14 0913 Last data filed at 05/14/14 0800  Gross per 24 hour  Intake   1560 ml  Output      0 ml  Net   1560 ml   Weight change: 9.707 kg (21 lb 6.4 oz) Gen: Somnolent but arousable CVS:RRR Resp:clear Abd:+ BS NTND Ext: Lt knee wrapped.  Lt AVF + bruit NEURO:CNI Ox3 no asterixis   . antiseptic oral rinse  7 mL Mouth Rinse BID  . budesonide-formoterol  2 puff Inhalation BID  . calcium acetate  667 mg Oral TID WC  . darbepoetin (ARANESP) injection - DIALYSIS  200 mcg Intravenous Q Thu-HD  . diltiazem  180 mg Oral Daily  . docusate sodium  100 mg Oral BID  . escitalopram  20 mg Oral Daily  . folic acid  1 mg Oral Daily  . gabapentin  200 mg Oral BID  . guaiFENesin  600 mg Oral BID  . heparin  4,000 Units Dialysis Once in dialysis  . ipratropium  0.5 mg Nebulization TID  . metoprolol tartrate  25 mg Oral BID  . morphine  15 mg Oral Q12H  . multivitamin with minerals  1 tablet Oral Daily  . nicotine  7 mg Transdermal Daily  . roflumilast  500 mcg Oral Daily  . senna  1 tablet Oral BID  . sodium chloride  3 mL Intravenous Q12H  . sodium chloride  3 mL Intravenous Q12H  . thiamine  100 mg Oral Daily  . vancomycin  125 mg Oral QID   Followed by  . vancomycin  125 mg Oral BID   Followed by  . [START ON 05/22/2014] vancomycin  125 mg Oral Daily   Followed by  . [START ON 05/29/2014] vancomycin  125 mg Oral QODAY   Followed by  . [START ON 06/06/2014] vancomycin  125 mg Oral Q3 days  . vancomycin  750 mg Intravenous Q T,Th,Sa-HD   No results found. BMET    Component Value Date/Time   NA 129* 05/14/2014 0409   K 5.5* 05/14/2014 0409   CL 91* 05/14/2014 0409   CO2 26 05/14/2014 0409   GLUCOSE 111* 05/14/2014 0409   BUN 49* 05/14/2014 0409   CREATININE 6.74* 05/14/2014 0409   CALCIUM 9.0 05/14/2014 0409   GFRNONAA 8* 05/14/2014 0409   GFRAA 10* 05/14/2014 0409   CBC    Component Value Date/Time   WBC 13.9* 05/14/2014 0409   RBC 3.07* 05/14/2014 0409   RBC 2.62* 05/07/2014 2130   HGB 9.0* 05/14/2014 0409   HCT 29.8* 05/14/2014 0409   PLT 472* 05/14/2014 0409   MCV 97.1 05/14/2014 0409   MCH 29.3 05/14/2014 0409   MCHC 30.2 05/14/2014 0409   RDW 16.7* 05/14/2014 0409   LYMPHSABS 0.7 05/12/2014 1000   MONOABS 0.7 05/12/2014 1000   EOSABS 0.2 05/12/2014 1000   BASOSABS 0.0 05/12/2014 1000     Assessment: 1. Lt knee effusion. Cult neg to date 2. HTN 3. Anemia on aranesp 4.+ C Diff, diarrhea resolved  Plan: 1. Plan HD today 2. Note plan for SNF [facility] T

## 2014-05-14 NOTE — Procedures (Signed)
Pt seen ob HD. AP 130 Vp 230.  BFR 400.  Note plans for DC post HD.

## 2014-05-15 ENCOUNTER — Encounter (HOSPITAL_COMMUNITY): Payer: Self-pay | Admitting: Adult Health

## 2014-05-15 ENCOUNTER — Inpatient Hospital Stay (HOSPITAL_COMMUNITY)
Admission: AD | Admit: 2014-05-15 | Discharge: 2014-05-18 | DRG: 189 | Disposition: A | Discharge status: Skilled Nursing Facility | Payer: Medicaid Other | Source: Other Acute Inpatient Hospital | Attending: Internal Medicine | Admitting: Internal Medicine

## 2014-05-15 DIAGNOSIS — Z79891 Long term (current) use of opiate analgesic: Secondary | ICD-10-CM

## 2014-05-15 DIAGNOSIS — J449 Chronic obstructive pulmonary disease, unspecified: Secondary | ICD-10-CM | POA: Diagnosis not present

## 2014-05-15 DIAGNOSIS — A047 Enterocolitis due to Clostridium difficile: Secondary | ICD-10-CM | POA: Diagnosis not present

## 2014-05-15 DIAGNOSIS — I4891 Unspecified atrial fibrillation: Secondary | ICD-10-CM | POA: Diagnosis present

## 2014-05-15 DIAGNOSIS — Y95 Nosocomial condition: Secondary | ICD-10-CM | POA: Diagnosis present

## 2014-05-15 DIAGNOSIS — I5042 Chronic combined systolic (congestive) and diastolic (congestive) heart failure: Secondary | ICD-10-CM | POA: Diagnosis not present

## 2014-05-15 DIAGNOSIS — I493 Ventricular premature depolarization: Secondary | ICD-10-CM | POA: Diagnosis not present

## 2014-05-15 DIAGNOSIS — G4733 Obstructive sleep apnea (adult) (pediatric): Secondary | ICD-10-CM | POA: Diagnosis not present

## 2014-05-15 DIAGNOSIS — Z7982 Long term (current) use of aspirin: Secondary | ICD-10-CM | POA: Diagnosis not present

## 2014-05-15 DIAGNOSIS — J189 Pneumonia, unspecified organism: Secondary | ICD-10-CM | POA: Diagnosis not present

## 2014-05-15 DIAGNOSIS — R4182 Altered mental status, unspecified: Secondary | ICD-10-CM | POA: Diagnosis present

## 2014-05-15 DIAGNOSIS — F909 Attention-deficit hyperactivity disorder, unspecified type: Secondary | ICD-10-CM | POA: Diagnosis not present

## 2014-05-15 DIAGNOSIS — T402X5A Adverse effect of other opioids, initial encounter: Secondary | ICD-10-CM | POA: Diagnosis not present

## 2014-05-15 DIAGNOSIS — Z992 Dependence on renal dialysis: Secondary | ICD-10-CM | POA: Diagnosis not present

## 2014-05-15 DIAGNOSIS — J9602 Acute respiratory failure with hypercapnia: Secondary | ICD-10-CM | POA: Diagnosis present

## 2014-05-15 DIAGNOSIS — F17210 Nicotine dependence, cigarettes, uncomplicated: Secondary | ICD-10-CM | POA: Diagnosis present

## 2014-05-15 DIAGNOSIS — I4892 Unspecified atrial flutter: Secondary | ICD-10-CM | POA: Diagnosis not present

## 2014-05-15 DIAGNOSIS — M1990 Unspecified osteoarthritis, unspecified site: Secondary | ICD-10-CM | POA: Diagnosis present

## 2014-05-15 DIAGNOSIS — E872 Acidosis, unspecified: Secondary | ICD-10-CM

## 2014-05-15 DIAGNOSIS — M009 Pyogenic arthritis, unspecified: Secondary | ICD-10-CM | POA: Diagnosis present

## 2014-05-15 DIAGNOSIS — Z7951 Long term (current) use of inhaled steroids: Secondary | ICD-10-CM

## 2014-05-15 DIAGNOSIS — H547 Unspecified visual loss: Secondary | ICD-10-CM | POA: Diagnosis present

## 2014-05-15 DIAGNOSIS — E875 Hyperkalemia: Secondary | ICD-10-CM | POA: Diagnosis not present

## 2014-05-15 DIAGNOSIS — E785 Hyperlipidemia, unspecified: Secondary | ICD-10-CM | POA: Diagnosis not present

## 2014-05-15 DIAGNOSIS — A0472 Enterocolitis due to Clostridium difficile, not specified as recurrent: Secondary | ICD-10-CM | POA: Diagnosis present

## 2014-05-15 DIAGNOSIS — Z79899 Other long term (current) drug therapy: Secondary | ICD-10-CM | POA: Diagnosis not present

## 2014-05-15 DIAGNOSIS — I739 Peripheral vascular disease, unspecified: Secondary | ICD-10-CM | POA: Diagnosis not present

## 2014-05-15 DIAGNOSIS — D649 Anemia, unspecified: Secondary | ICD-10-CM | POA: Diagnosis present

## 2014-05-15 DIAGNOSIS — N186 End stage renal disease: Secondary | ICD-10-CM

## 2014-05-15 DIAGNOSIS — Z8711 Personal history of peptic ulcer disease: Secondary | ICD-10-CM | POA: Diagnosis not present

## 2014-05-15 DIAGNOSIS — I120 Hypertensive chronic kidney disease with stage 5 chronic kidney disease or end stage renal disease: Secondary | ICD-10-CM | POA: Diagnosis present

## 2014-05-15 HISTORY — DX: End stage renal disease: Z99.2

## 2014-05-15 HISTORY — DX: Dependence on renal dialysis: N18.6

## 2014-05-15 LAB — MAGNESIUM: MAGNESIUM: 2.6 mg/dL — AB (ref 1.5–2.5)

## 2014-05-15 LAB — BLOOD GAS, ARTERIAL
ACID-BASE DEFICIT: 0.7 mmol/L (ref 0.0–2.0)
BICARBONATE: 25.5 meq/L — AB (ref 20.0–24.0)
Delivery systems: POSITIVE
Drawn by: 319961
EXPIRATORY PAP: 5
FIO2: 0.5 %
Inspiratory PAP: 15
LHR: 10 {breaths}/min
Mode: POSITIVE
O2 Saturation: 98.9 %
PCO2 ART: 58.4 mmHg — AB (ref 35.0–45.0)
PH ART: 7.263 — AB (ref 7.350–7.450)
PO2 ART: 160 mmHg — AB (ref 80.0–100.0)
Patient temperature: 98.6
TCO2: 27.3 mmol/L (ref 0–100)

## 2014-05-15 LAB — POCT I-STAT 3, ART BLOOD GAS (G3+)
ACID-BASE DEFICIT: 1 mmol/L (ref 0.0–2.0)
BICARBONATE: 27.6 meq/L — AB (ref 20.0–24.0)
O2 Saturation: 99 %
PCO2 ART: 68.3 mmHg — AB (ref 35.0–45.0)
PH ART: 7.211 — AB (ref 7.350–7.450)
Patient temperature: 97.8
TCO2: 30 mmol/L (ref 0–100)
pO2, Arterial: 150 mmHg — ABNORMAL HIGH (ref 80.0–100.0)

## 2014-05-15 LAB — COMPREHENSIVE METABOLIC PANEL
ALT: 479 U/L — ABNORMAL HIGH (ref 0–53)
AST: 710 U/L — ABNORMAL HIGH (ref 0–37)
Albumin: 2.2 g/dL — ABNORMAL LOW (ref 3.5–5.2)
Alkaline Phosphatase: 229 U/L — ABNORMAL HIGH (ref 39–117)
Anion gap: 13 (ref 5–15)
BUN: 28 mg/dL — ABNORMAL HIGH (ref 6–23)
CO2: 23 mmol/L (ref 19–32)
Calcium: 8.7 mg/dL (ref 8.4–10.5)
Chloride: 100 mEq/L (ref 96–112)
Creatinine, Ser: 5.28 mg/dL — ABNORMAL HIGH (ref 0.50–1.35)
GFR calc Af Amer: 13 mL/min — ABNORMAL LOW (ref >90)
GFR calc non Af Amer: 11 mL/min — ABNORMAL LOW (ref >90)
GLUCOSE: 104 mg/dL — AB (ref 70–99)
POTASSIUM: 5.6 mmol/L — AB (ref 3.5–5.1)
Sodium: 136 mmol/L (ref 135–145)
Total Bilirubin: 1 mg/dL (ref 0.3–1.2)
Total Protein: 5.8 g/dL — ABNORMAL LOW (ref 6.0–8.3)

## 2014-05-15 LAB — CBC
HCT: 26.4 % — ABNORMAL LOW (ref 39.0–52.0)
HEMOGLOBIN: 7.9 g/dL — AB (ref 13.0–17.0)
MCH: 29.2 pg (ref 26.0–34.0)
MCHC: 29.9 g/dL — AB (ref 30.0–36.0)
MCV: 97.4 fL (ref 78.0–100.0)
Platelets: 388 10*3/uL (ref 150–400)
RBC: 2.71 MIL/uL — AB (ref 4.22–5.81)
RDW: 16.7 % — AB (ref 11.5–15.5)
WBC: 12.9 10*3/uL — ABNORMAL HIGH (ref 4.0–10.5)

## 2014-05-15 LAB — PHOSPHORUS: Phosphorus: 6.4 mg/dL — ABNORMAL HIGH (ref 2.3–4.6)

## 2014-05-15 LAB — GLUCOSE, CAPILLARY
Glucose-Capillary: 89 mg/dL (ref 70–99)
Glucose-Capillary: 78 mg/dL (ref 70–99)

## 2014-05-15 LAB — LACTIC ACID, PLASMA: Lactic Acid, Venous: 0.5 mmol/L (ref 0.5–2.2)

## 2014-05-15 LAB — MRSA PCR SCREENING: MRSA by PCR: NEGATIVE

## 2014-05-15 LAB — PROCALCITONIN: Procalcitonin: 7.89 ng/mL

## 2014-05-15 MED ORDER — METOPROLOL TARTRATE 1 MG/ML IV SOLN
2.5000 mg | INTRAVENOUS | Status: DC | PRN
  Filled 2014-05-15: qty 5

## 2014-05-15 MED ORDER — HEPARIN SODIUM (PORCINE) 5000 UNIT/ML IJ SOLN
5000.0000 [IU] | Freq: Three times a day (TID) | INTRAMUSCULAR | Status: DC
  Administered 2014-05-15 – 2014-05-18 (×8): 5000 [IU] via SUBCUTANEOUS
  Filled 2014-05-15 (×11): qty 1

## 2014-05-15 MED ORDER — PIPERACILLIN-TAZOBACTAM IN DEX 2-0.25 GM/50ML IV SOLN
2.2500 g | Freq: Three times a day (TID) | INTRAVENOUS | Status: DC
  Administered 2014-05-15 (×2): 2.25 g via INTRAVENOUS
  Filled 2014-05-15 (×4): qty 50

## 2014-05-15 MED ORDER — CHLORHEXIDINE GLUCONATE 0.12 % MT SOLN
15.0000 mL | Freq: Two times a day (BID) | OROMUCOSAL | Status: DC
  Administered 2014-05-16 – 2014-05-18 (×4): 15 mL via OROMUCOSAL
  Filled 2014-05-15 (×7): qty 15

## 2014-05-15 MED ORDER — NALOXONE HCL 0.4 MG/ML IJ SOLN
0.4000 mg | Freq: Once | INTRAMUSCULAR | Status: AC
  Administered 2014-05-15: 0.4 mg via INTRAVENOUS
  Filled 2014-05-15: qty 1

## 2014-05-15 MED ORDER — FOLIC ACID 5 MG/ML IJ SOLN
1.0000 mg | Freq: Every day | INTRAMUSCULAR | Status: DC
  Administered 2014-05-15 – 2014-05-17 (×3): 1 mg via INTRAVENOUS
  Filled 2014-05-15 (×3): qty 0.2

## 2014-05-15 MED ORDER — METRONIDAZOLE IN NACL 5-0.79 MG/ML-% IV SOLN
500.0000 mg | Freq: Three times a day (TID) | INTRAVENOUS | Status: DC
  Administered 2014-05-15 – 2014-05-16 (×2): 500 mg via INTRAVENOUS
  Filled 2014-05-15 (×5): qty 100

## 2014-05-15 MED ORDER — VANCOMYCIN HCL IN DEXTROSE 750-5 MG/150ML-% IV SOLN
750.0000 mg | INTRAVENOUS | Status: DC
  Administered 2014-05-16: 750 mg via INTRAVENOUS
  Filled 2014-05-15: qty 150

## 2014-05-15 MED ORDER — CETYLPYRIDINIUM CHLORIDE 0.05 % MT LIQD
7.0000 mL | Freq: Two times a day (BID) | OROMUCOSAL | Status: DC
  Administered 2014-05-16 – 2014-05-17 (×4): 7 mL via OROMUCOSAL

## 2014-05-15 MED ORDER — FAMOTIDINE IN NACL 20-0.9 MG/50ML-% IV SOLN
20.0000 mg | INTRAVENOUS | Status: DC
Start: 2014-05-15 — End: 2014-05-16
  Administered 2014-05-15: 20 mg via INTRAVENOUS
  Filled 2014-05-15 (×2): qty 50

## 2014-05-15 MED ORDER — IPRATROPIUM-ALBUTEROL 0.5-2.5 (3) MG/3ML IN SOLN
3.0000 mL | Freq: Four times a day (QID) | RESPIRATORY_TRACT | Status: DC
  Administered 2014-05-15 – 2014-05-18 (×9): 3 mL via RESPIRATORY_TRACT
  Filled 2014-05-15 (×10): qty 3

## 2014-05-15 MED ORDER — THIAMINE HCL 100 MG/ML IJ SOLN
100.0000 mg | Freq: Every day | INTRAMUSCULAR | Status: DC
  Administered 2014-05-15 – 2014-05-17 (×3): 100 mg via INTRAVENOUS
  Filled 2014-05-15 (×3): qty 1

## 2014-05-15 MED ORDER — ALBUTEROL SULFATE (2.5 MG/3ML) 0.083% IN NEBU: 2.5000 mg | INHALATION_SOLUTION | RESPIRATORY_TRACT | Status: DC | PRN

## 2014-05-15 MED ORDER — SODIUM CHLORIDE 0.9 % IV SOLN
INTRAVENOUS | Status: DC
  Administered 2014-05-17: 07:00:00 via INTRAVENOUS

## 2014-05-15 MED ORDER — VANCOMYCIN HCL IN DEXTROSE 750-5 MG/150ML-% IV SOLN
750.0000 mg | Freq: Once | INTRAVENOUS | Status: AC
  Administered 2014-05-15: 750 mg via INTRAVENOUS
  Filled 2014-05-15: qty 150

## 2014-05-15 MED ORDER — SODIUM CHLORIDE 0.9 % IV SOLN: 250.0000 mL | INTRAVENOUS | Status: DC | PRN

## 2014-05-15 NOTE — Progress Notes (Addendum)
ANTIBIOTIC CONSULT NOTE - INITIAL  Pharmacy Consult for Vancomycin and Zosyn Indication: rule out pneumonia and L knee septic arthritis  Allergies  Allergen Reactions  . Tylenol [Acetaminophen] Nausea Only    Patient Measurements: Actual Body Weight: 70.5 kg  Vital Signs:   Intake/Output from previous day:   Intake/Output from this shift:    Labs:  Recent Labs  05/14/14 0409 05/14/14 1439  WBC 13.9* 13.2*  HGB 9.0* 8.4*  PLT 472* 508*  CREATININE 6.74* 7.25*   Estimated Creatinine Clearance: 11 mL/min (by C-G formula based on Cr of 7.25). No results for input(s): VANCOTROUGH, VANCOPEAK, VANCORANDOM, GENTTROUGH, GENTPEAK, GENTRANDOM, TOBRATROUGH, TOBRAPEAK, TOBRARND, AMIKACINPEAK, AMIKACINTROU, AMIKACIN in the last 72 hours.   Microbiology: Recent Results (from the past 720 hour(s))  MRSA PCR Screening     Status: None   Collection Time: 05/07/14  6:41 PM  Result Value Ref Range Status   MRSA by PCR NEGATIVE NEGATIVE Final    Comment:        The GeneXpert MRSA Assay (FDA approved for NASAL specimens only), is one component of a comprehensive MRSA colonization surveillance program. It is not intended to diagnose MRSA infection nor to guide or monitor treatment for MRSA infections.   Culture, blood (routine x 2)     Status: None   Collection Time: 05/07/14  9:30 PM  Result Value Ref Range Status   Specimen Description BLOOD RIGHT ARM  Final   Special Requests BOTTLES DRAWN AEROBIC AND ANAEROBIC 10CC  Final   Culture   Final    NO GROWTH 5 DAYS Performed at [facility]    Report Status 05/14/2014 FINAL  Final  Culture, blood (routine x 2)     Status: None   Collection Time: 05/07/14  9:40 PM  Result Value Ref Range Status   Specimen Description BLOOD RIGHT HAND  Final   Special Requests BOTTLES DRAWN AEROBIC AND ANAEROBIC 5CC  Final   Culture   Final    NO GROWTH 5 DAYS Performed at [facility]    Report Status 05/14/2014 FINAL   Final  Anaerobic culture     Status: None   Collection Time: 05/08/14  6:38 PM  Result Value Ref Range Status   Specimen Description SYNOVIAL FLUID KNEE LEFT  Final   Special Requests SPECIMEN B  Final   Gram Stain   Final    MODERATE WBC PRESENT,BOTH PMN AND MONONUCLEAR NO ORGANISMS SEEN Performed at [facility]    Culture   Final    NO ANAEROBES ISOLATED Performed at [facility]    Report Status 05/13/2014 FINAL  Final  Body fluid culture     Status: None   Collection Time: 05/08/14  6:38 PM  Result Value Ref Range Status   Specimen Description SYNOVIAL FLUID KNEE LEFT  Final   Special Requests SPECIMEN B  Final   Gram Stain   Final    MODERATE WBC PRESENT,BOTH PMN AND MONONUCLEAR NO ORGANISMS SEEN Performed at [facility]    Culture   Final    NO GROWTH 3 DAYS Performed at [facility]    Report Status 05/12/2014 FINAL  Final  Anaerobic culture     Status: None   Collection Time: 05/08/14  6:42 PM  Result Value Ref Range Status   Specimen Description SYNOVIAL KNEE LEFT  Final   Special Requests SPECIMEN C  Final   Gram Stain   Final    MODERATE WBC PRESENT,BOTH  PMN AND MONONUCLEAR NO ORGANISMS SEEN Performed at [facility]    Culture   Final    NO ANAEROBES ISOLATED Performed at [facility]    Report Status 05/13/2014 FINAL  Final  Body fluid culture     Status: None   Collection Time: 05/08/14  6:42 PM  Result Value Ref Range Status   Specimen Description SYNOVIAL KNEE LEFT  Final   Special Requests SPECIMEN C  Final   Gram Stain   Final    MODERATE WBC PRESENT,BOTH PMN AND MONONUCLEAR NO ORGANISMS SEEN Performed at [facility]    Culture   Final    NO GROWTH 3 DAYS Performed at [facility]    Report Status 05/12/2014 FINAL  Final  Anaerobic culture     Status: None   Collection Time: 05/08/14  6:44 PM  Result Value Ref Range Status   Specimen Description SYNOVIAL FLUID KNEE LEFT   Final   Special Requests SPECIMEN D  Final   Gram Stain   Final    MODERATE WBC PRESENT,BOTH PMN AND MONONUCLEAR NO ORGANISMS SEEN Performed at [facility]    Culture   Final    NO ANAEROBES ISOLATED Performed at [facility]    Report Status 05/13/2014 FINAL  Final  Body fluid culture     Status: None   Collection Time: 05/08/14  6:44 PM  Result Value Ref Range Status   Specimen Description SYNOVIAL FLUID KNEE LEFT  Final   Special Requests SPECIMEN D  Final   Gram Stain   Final    MODERATE WBC PRESENT,BOTH PMN AND MONONUCLEAR NO ORGANISMS SEEN Performed at [facility]    Culture   Final    NO GROWTH 3 DAYS Performed at [facility]    Report Status 05/12/2014 FINAL  Final  Anaerobic culture     Status: None   Collection Time: 05/08/14  6:55 PM  Result Value Ref Range Status   Specimen Description SYNOVIAL FLUID KNEE LEFT  Final   Special Requests SPECIMEN E  Final   Gram Stain   Final    MODERATE WBC PRESENT,BOTH PMN AND MONONUCLEAR NO ORGANISMS SEEN Performed at [facility]    Culture   Final    NO ANAEROBES ISOLATED Performed at [facility]    Report Status 05/13/2014 FINAL  Final  Body fluid culture     Status: None   Collection Time: 05/08/14  6:55 PM  Result Value Ref Range Status   Specimen Description SYNOVIAL FLUID KNEE LEFT  Final   Special Requests SPECIMEN E  Final   Gram Stain   Final    MODERATE WBC PRESENT,BOTH PMN AND MONONUCLEAR NO ORGANISMS SEEN Performed at [facility]    Culture   Final    NO GROWTH 3 DAYS Performed at [facility]    Report Status 05/12/2014 FINAL  Final  Clostridium Difficile by PCR     Status: Abnormal   Collection Time: 05/11/14  7:30 PM  Result Value Ref Range Status   C difficile by pcr POSITIVE (A) NEGATIVE Final    Comment: CRITICAL RESULT CALLED TO, READ BACK BY AND VERIFIED WITH: L.HOWELL,RN 05/12/14 @1103  BY V.WILKINS     Medical  History: Past Medical History  Diagnosis Date  . Hypertension   . Systolic heart failure     EF 16-10%  . Hyperlipidemia   . COPD (chronic obstructive pulmonary disease)   .  Peripheral vascular disease   . Pneumonia 1980's  . OSA on CPAP   . ETOH abuse   . History of stomach ulcers   . Arthritis     "arms"  . Chronic lower back pain   . Anxiety   . Depression   . Adult ADHD     "never diagnosed; my son was; I think I've got it too"  . Chronic kidney disease (CKD), stage III (moderate)     baseline cr 2.3  . Renal cyst     left, complex  . Renal artery stenosis     a. s/p BMS to inferior branch of right renal artery 12/2011. On left, 3 renal arteries were noted, 2 of them were very small in size and subtotally occluded  . Anginal pain   . CHF (congestive heart failure)   . Seasonal allergies   . Hepatitis     "I think it was C"  . Septic joint of left knee joint 05/08/2014    Medications:  Prescriptions prior to admission  Medication Sig Dispense Refill Last Dose  . albuterol (PROVENTIL HFA;VENTOLIN HFA) 108 (90 BASE) MCG/ACT inhaler Inhale 2 puffs into the lungs every 6 (six) hours as needed for wheezing or shortness of breath.   05/06/2014 at Unknown time  . ALPRAZolam (XANAX) 0.5 MG tablet Take 1 tablet (0.5 mg total) by mouth 3 (three) times daily as needed for anxiety. 20 tablet 0   . aspirin EC 325 MG EC tablet Take 1 tablet (325 mg total) by mouth daily. (Patient not taking: Reported on 05/07/2014) 30 tablet 2 Not Taking at Unknown time  . budesonide-formoterol (SYMBICORT) 80-4.5 MCG/ACT inhaler Inhale 2 puffs into the lungs 2 (two) times daily. 1 Inhaler 1 05/06/2014 at Unknown time  . calcium acetate (PHOSLO) 667 MG capsule Take 1 capsule (667 mg total) by mouth 3 (three) times daily with meals. 90 capsule 1   . calcium carbonate (TUMS - DOSED IN MG ELEMENTAL CALCIUM) 500 MG chewable tablet Chew 1 tablet (200 mg of elemental calcium total) by mouth 3 (three) times  daily as needed for indigestion or heartburn. 90 tablet 1   . diltiazem (CARDIZEM CD) 180 MG 24 hr capsule Take 1 capsule (180 mg total) by mouth daily. 60 capsule 3 05/07/2014 at Unknown time  . escitalopram (LEXAPRO) 10 MG tablet Take 1 tablet (10 mg total) by mouth daily. 30 tablet 2 last couple of months  . famotidine (PEPCID) 20 MG tablet Take 1 tablet (20 mg total) by mouth 2 (two) times daily. 60 tablet 2 05/06/2014 at Unknown time  . gabapentin (NEURONTIN) 100 MG capsule Take 2 capsules (200 mg total) by mouth 2 (two) times daily. 120 capsule 0 05/06/2014 at Unknown time  . metoprolol tartrate (LOPRESSOR) 25 MG tablet Take 1 tablet (25 mg total) by mouth 2 (two) times daily. 60 tablet 2 05/07/2014 at 0900  . morphine (MS CONTIN) 15 MG 12 hr tablet Take 1 tablet (15 mg total) by mouth every 12 (twelve) hours. 20 tablet 0   . ondansetron (ZOFRAN-ODT) 4 MG disintegrating tablet Take 4 mg by mouth every 6 (six) hours as needed for nausea.   0 05/04/2014  . oxyCODONE (OXY IR/ROXICODONE) 5 MG immediate release tablet Take 1 tablet (5 mg total) by mouth every 6 (six) hours as needed. 20 tablet 0   . roflumilast (DALIRESP) 500 MCG TABS tablet Take 500 mcg by mouth daily.   05/06/2014 at Unknown time  . senna (SENOKOT)  8.6 MG TABS tablet Take 1 tablet (8.6 mg total) by mouth 2 (two) times daily. 120 each 0   . thiamine 100 MG tablet Take 1 tablet (100 mg total) by mouth daily. 30 tablet 3 05/06/2014 at Unknown time  . tiotropium (SPIRIVA) 18 MCG inhalation capsule Place 18 mcg into inhaler and inhale daily.   05/06/2014 at Unknown time  . traZODone (DESYREL) 25 mg TABS tablet Take 0.5 tablets (25 mg total) by mouth at bedtime as needed for sleep. 30 tablet 0 05/06/2014 at Unknown time  . vancomycin (VANCOCIN) 50 mg/mL oral solution Take 2.5 mLs (125 mg total) by mouth 4 (four) times daily. 70 mL 0   . vancomycin (VANCOCIN) 50 mg/mL oral solution Take 2.5 mLs (125 mg total) by mouth 2 (two) times daily.  35 mL 0   . [START ON 05/22/2014] vancomycin (VANCOCIN) 50 mg/mL oral solution Take 2.5 mLs (125 mg total) by mouth daily. 17.5 mL 0   . [START ON 05/29/2014] vancomycin (VANCOCIN) 50 mg/mL oral solution Take 2.5 mLs (125 mg total) by mouth every other day. 10 mL 0   . [START ON 06/06/2014] vancomycin (VANCOCIN) 50 mg/mL oral solution Take 2.5 mLs (125 mg total) by mouth every 3 (three) days. 5 mL 0   . Vancomycin (VANCOCIN) 750 MG/150ML SOLN Inject 150 mLs (750 mg total) into the vein Every Tuesday,Thursday,and Saturday with dialysis. 150 mL 0   . vitamin C (ASCORBIC ACID) 500 MG tablet Take 500 mg by mouth daily.   05/06/2014 at Unknown time   Scheduled:  . heparin  5,000 Units Subcutaneous 3 times per day  . ipratropium-albuterol  3 mL Nebulization Q6H   Infusions:  . sodium chloride     Assessment: 53yo male with history of ESRD on HD TTS presents with AMS after being discharged 05/14/14 from septic arthritis in L knee, C.diff, and Afib with RVR. Pharmacy is consulted to dose vancomycin and zosyn for possible HCAP and L knee septic arthritis. Pt is afebrile, WBC 13.2, sCr 7.25.  Pt received vancomycin 750mg  IV once 05/14/14 after dialysis. Will need to provide additional dose for full bolus.  Goal of Therapy:  Vancomycin pre-HD level 15-25 mcg/ml  Plan:  Vancomycin 750mg  IV once to fully load patient at 25mg /kg followed by vancomycin 750mg  IV qHD Zosyn 2.25g IV q8h Measure antibiotic drug levels at steady state Follow up culture results, HD plan, clinical course  Arlean HoppingCorey M. Newman PiesBall, PharmD Clinical Pharmacist Pager [phone] 05/15/2014,3:36 PM

## 2014-05-15 NOTE — Progress Notes (Signed)
S: Somnolent on BiPAP O:BP 126/74 mmHg  Pulse 77  Resp 14  SpO2 100% No intake or output data in the 24 hours ending 05/15/14 1617 Weight change:  Gen: Somnolent but will open eyes.  On BiPAP CVS:RRR Resp: scattered rhonchi ant Abd:+ BS NTND Ext: Lt knee wrapped.  Lt AVF + bruit NEURO: Somnolent   . famotidine (PEPCID) IV  20 mg Intravenous Q24H  . folic acid  1 mg Intravenous Daily  . heparin  5,000 Units Subcutaneous 3 times per day  . ipratropium-albuterol  3 mL Nebulization Q6H  . metronidazole  500 mg Intravenous Q8H  . naLOXone (NARCAN)  injection  0.4 mg Intravenous Once  . piperacillin-tazobactam (ZOSYN)  IV  2.25 g Intravenous Q8H  . thiamine  100 mg Intravenous Daily  . vancomycin  750 mg Intravenous Once  . [START ON 05/16/2014] vancomycin  750 mg Intravenous Q [place],Th,Sa-HD   No results found. BMET    Component Value Date/Time   NA 128* 05/14/2014 1439   K 5.3* 05/14/2014 1439   CL 87* 05/14/2014 1439   CO2 28 05/14/2014 1439   GLUCOSE 108* 05/14/2014 1439   BUN 53* 05/14/2014 1439   CREATININE 7.25* 05/14/2014 1439   CALCIUM 8.7 05/14/2014 1439   GFRNONAA 8* 05/14/2014 1439   GFRAA 9* 05/14/2014 1439   CBC    Component Value Date/Time   WBC 13.2* 05/14/2014 1439   RBC 2.86* 05/14/2014 1439   RBC 2.62* 05/07/2014 2130   HGB 8.4* 05/14/2014 1439   HCT 27.5* 05/14/2014 1439   PLT 508* 05/14/2014 1439   MCV 96.2 05/14/2014 1439   MCH 29.4 05/14/2014 1439   MCHC 30.5 05/14/2014 1439   RDW 16.4* 05/14/2014 1439   LYMPHSABS 0.7 05/12/2014 1000   MONOABS 0.7 05/12/2014 1000   EOSABS 0.2 05/12/2014 1000   BASOSABS 0.0 05/12/2014 1000     Assessment: 1. Acute resp failure most likely sec to narcotics 2. HTN 3. Anemia on aranesp 4. Hyperkalemia based on K from [facility] of 6.7 5.  Pulm edema  Plan: 1. Plan HD today to remove volume and lower K  [name]

## 2014-05-15 NOTE — H&P (Signed)
PULMONARY / CRITICAL CARE MEDICINE   Name: [name] MRN: [id number] DOB: Aug 07, 1960    ADMISSION DATE:  05/15/2014 CONSULTATION DATE:  1/6   CHIEF COMPLAINT:  AMS, PNA   INITIAL PRESENTATION:  54yo male active smoker with hx ETOH, uncontrolled HTN, COPD, sCHF, OSA on CPAP, ESRD (T, Th, S) just d/c 1/5 after admission for septic arthritis L knee, C Diff, AFib RVR.   He was d/c 1/5 to SNF rehab with plans for 6 weeks of IV vanc (on HD days).  On am 1/6 he was found lethargic at SNF, minimal response to narcan.  Was taken to [facility] ER, placed on bipap and tx to [facility].     STUDIES:    SIGNIFICANT EVENTS: 12/29>> admit to [facility], L knee septic arthritis, AFib RVR 12/30>>>L knee arthroscopy with irrigation/ debridement  1/5>>d/c to SNF rehab  1/6>> found lethargic at SNF, sent to [facility] ER, placed on bipap, tx to [facility]    HISTORY OF PRESENT ILLNESS:  53yo male with hx ETOH, uncontrolled HTN, COPD, sCHF, OSA on CPAP, ESRD (T, Th, S) just d/c 1/5 after admission for septic arthritis L knee, C Diff, AFib RVR.   He was d/c 1/5 to SNF rehab with plans for 6 weeks of IV vanc (on HD days).  On am 1/6 he was found lethargic at SNF, some response to narcan.  Was taken to [facility] ER, placed on bipap and tx to [facility].     PAST MEDICAL HISTORY :   has a past medical history of Hypertension; Systolic heart failure; Hyperlipidemia; COPD (chronic obstructive pulmonary disease); Peripheral vascular disease; Pneumonia (1980's); OSA on CPAP; ETOH abuse; History of stomach ulcers; Arthritis; Chronic lower back pain; Anxiety; Depression; Adult ADHD; ESRD on hemodialysis; Renal cyst; Renal artery stenosis; Anginal pain; CHF (congestive heart failure); Seasonal allergies; Hepatitis; and Septic joint of left knee joint (05/08/2014).  has past surgical history that includes Renal artery stent (01/05/2012); AV fistula placement (Left, 10/10/2012); abdominal aortagram (N/A, 01/05/2012); renal angiogram (N/A, 01/05/2012);  and Knee arthroscopy (Left, 05/08/2014). Prior to Admission medications   Medication Sig Start Date End Date Taking? Authorizing Provider  albuterol (PROVENTIL HFA;VENTOLIN HFA) 108 (90 BASE) MCG/ACT inhaler Inhale 2 puffs into the lungs every 6 (six) hours as needed for wheezing or shortness of breath.    Historical Provider, MD  ALPRAZolam Prudy Feeler) 0.5 MG tablet Take 1 tablet (0.5 mg total) by mouth 3 (three) times daily as needed for anxiety. 05/14/14   Jeralyn Bennett, MD  aspirin EC 325 MG EC tablet Take 1 tablet (325 mg total) by mouth daily. Patient not taking: Reported on 05/07/2014 02/18/14   Richarda Overlie, MD  budesonide-formoterol (SYMBICORT) 80-4.5 MCG/ACT inhaler Inhale 2 puffs into the lungs 2 (two) times daily. 01/17/14   Zannie Cove, MD  calcium acetate (PHOSLO) 667 MG capsule Take 1 capsule (667 mg total) by mouth 3 (three) times daily with meals. 05/14/14   Jeralyn Bennett, MD  calcium carbonate (TUMS - DOSED IN MG ELEMENTAL CALCIUM) 500 MG chewable tablet Chew 1 tablet (200 mg of elemental calcium total) by mouth 3 (three) times daily as needed for indigestion or heartburn. 05/14/14   Jeralyn Bennett, MD  diltiazem (CARDIZEM CD) 180 MG 24 hr capsule Take 1 capsule (180 mg total) by mouth daily. 02/18/14   Richarda Overlie, MD  escitalopram (LEXAPRO) 10 MG tablet Take 1 tablet (10 mg total) by mouth daily. 02/18/14   Richarda Overlie, MD  famotidine (PEPCID) 20 MG tablet Take  1 tablet (20 mg total) by mouth 2 (two) times daily. 02/18/14   Richarda Overlie, MD  gabapentin (NEURONTIN) 100 MG capsule Take 2 capsules (200 mg total) by mouth 2 (two) times daily. 03/03/14   Joseph Art, DO  metoprolol tartrate (LOPRESSOR) 25 MG tablet Take 1 tablet (25 mg total) by mouth 2 (two) times daily. 02/18/14   Richarda Overlie, MD  morphine (MS CONTIN) 15 MG 12 hr tablet Take 1 tablet (15 mg total) by mouth every 12 (twelve) hours. 05/14/14   Jeralyn Bennett, MD  ondansetron (ZOFRAN-ODT) 4 MG disintegrating tablet  Take 4 mg by mouth every 6 (six) hours as needed for nausea.  04/09/14   Historical Provider, MD  oxyCODONE (OXY IR/ROXICODONE) 5 MG immediate release tablet Take 1 tablet (5 mg total) by mouth every 6 (six) hours as needed. 05/14/14   Jeralyn Bennett, MD  roflumilast (DALIRESP) 500 MCG TABS tablet Take 500 mcg by mouth daily.    Historical Provider, MD  senna (SENOKOT) 8.6 MG TABS tablet Take 1 tablet (8.6 mg total) by mouth 2 (two) times daily. 05/14/14   Jeralyn Bennett, MD  thiamine 100 MG tablet Take 1 tablet (100 mg total) by mouth daily. 02/18/14   Richarda Overlie, MD  tiotropium (SPIRIVA) 18 MCG inhalation capsule Place 18 mcg into inhaler and inhale daily.    Historical Provider, MD  traZODone (DESYREL) 25 mg TABS tablet Take 0.5 tablets (25 mg total) by mouth at bedtime as needed for sleep. 02/18/14   Richarda Overlie, MD  vancomycin (VANCOCIN) 50 mg/mL oral solution Take 2.5 mLs (125 mg total) by mouth 4 (four) times daily. 05/14/14   Jeralyn Bennett, MD  vancomycin (VANCOCIN) 50 mg/mL oral solution Take 2.5 mLs (125 mg total) by mouth 2 (two) times daily. 05/14/14   Jeralyn Bennett, MD  vancomycin (VANCOCIN) 50 mg/mL oral solution Take 2.5 mLs (125 mg total) by mouth daily. 05/22/14   Jeralyn Bennett, MD  vancomycin (VANCOCIN) 50 mg/mL oral solution Take 2.5 mLs (125 mg total) by mouth every other day. 05/29/14   Jeralyn Bennett, MD  vancomycin (VANCOCIN) 50 mg/mL oral solution Take 2.5 mLs (125 mg total) by mouth every 3 (three) days. 06/06/14   Jeralyn Bennett, MD  Vancomycin (VANCOCIN) 750 MG/150ML SOLN Inject 150 mLs (750 mg total) into the vein Every Tuesday,Thursday,and Saturday with dialysis. 05/14/14   Jeralyn Bennett, MD  vitamin C (ASCORBIC ACID) 500 MG tablet Take 500 mg by mouth daily.    Historical Provider, MD   Allergies  Allergen Reactions  . Tylenol [Acetaminophen] Nausea Only    FAMILY HISTORY:  reported the following about his mother: brain tumor. He indicated that his father is  deceased. He indicated that his paternal grandfather is deceased. He indicated that his paternal uncle is deceased.  SOCIAL HISTORY:  reports that he has been smoking Cigarettes.  He has a 4 pack-year smoking history. His smokeless tobacco use includes Snuff and Chew. He reports that he drinks alcohol. He reports that he does not use illicit drugs.  REVIEW OF SYSTEMS:  Difficult to obtain, pt confused on bipap.   SUBJECTIVE:   VITAL SIGNS: Temp:  [98.3 F (36.8 C)] 98.3 F (36.8 C) (01/05 1738) Pulse Rate:  [77-93] 77 (01/06 1545) Resp:  [14-18] 14 (01/06 1545) BP: (115-127)/(59-87) 126/74 mmHg (01/06 1545) SpO2:  [95 %-100 %] 100 % (01/06 1545) FiO2 (%):  [50 %] 50 % (01/06 1545) Weight:  [155 lb 6.8 oz (70.5 kg)] 155 lb  6.8 oz (70.5 kg) (01/05 1738) HEMODYNAMICS:   VENTILATOR SETTINGS: Vent Mode:  [-] BIPAP FiO2 (%):  [50 %] 50 % Set Rate:  [10 bmp] 10 bmp PEEP:  [5 cmH20] 5 cmH20 Pressure Support:  [15 cmH20] 15 cmH20 INTAKE / OUTPUT: No intake or output data in the 24 hours ending 05/15/14 1600  PHYSICAL EXAMINATION: General:  Chronically ill appearing male, appears much older than stated age  Neuro:  Lethargic on bipap, arouses to voice, waves, follows commands, quickly drifts back off, MAE  HEENT:  Mm dry, bipap, no JVD  Cardiovascular:  s1s2 irreg Lungs:  resps even, non labored on bipap, coarse  Abdomen:  Round, soft, +bs  Musculoskeletal:  Warm and dry, no sig edema, L knee wrapped, swollen   LABS:   CBC  Recent Labs Lab 05/12/14 1000 05/14/14 0409 05/14/14 1439  WBC 15.7* 13.9* 13.2*  HGB 8.6* 9.0* 8.4*  HCT 28.5* 29.8* 27.5*  PLT 491* 472* 508*   Coag's No results for input(s): APTT, INR in the last 168 hours. BMET  Recent Labs Lab 05/12/14 1000 05/14/14 0409 05/14/14 1439  NA 133* 129* 128*  K 4.3 5.5* 5.3*  CL 94* 91* 87*  CO2 28 26 28   BUN 26* 49* 53*  CREATININE 4.32* 6.74* 7.25*  GLUCOSE 105* 111* 108*   Electrolytes  Recent  Labs Lab 05/10/14 0313 05/12/14 1000 05/14/14 0409 05/14/14 1439  CALCIUM 7.9* 8.5 9.0 8.7  MG  --  2.3  --   --   PHOS 4.2  --   --  6.8*   Sepsis Markers No results for input(s): LATICACIDVEN, PROCALCITON, O2SATVEN in the last 168 hours. ABG  Recent Labs Lab 05/15/14 1536  PHART 7.211*  PCO2ART 68.3*  PO2ART 150.0*   Liver Enzymes  Recent Labs Lab 05/09/14 0323 05/10/14 0313 05/12/14 1000 05/14/14 1439  AST 15  --  12  --   ALT 9  --  11  --   ALKPHOS 112  --  108  --   BILITOT 0.5  --  0.8  --   ALBUMIN 2.0* 2.1* 2.1* 2.3*   Cardiac Enzymes No results for input(s): TROPONINI, PROBNP in the last 168 hours. Glucose No results for input(s): GLUCAP in the last 168 hours.  Imaging No results found.   ASSESSMENT / PLAN:  PULMONARY Acute respiratory failure - multifactorial in setting probable oversedation in setting ESRD, COPD, decompensated OSA +/- HCAP.  ?HCAP  COPD  Tobacco abuse  Respiratory acidosis  OSA on CPAP  P:   Cont bipap for now  F/u ABG  Repeat narcan x 1  Consider narcan gtt +/- HD  BD's  F/u CXR  Pulmonary hygiene as able  Smoking cessation  Abx - see ID   CARDIOVASCULAR Hx HTN  Chronic sCHF  AFib - during last admit.  Not anticoagulated.  Now NSR>  P:  EKG with hyperkalemia in ER  ASA Hold home cardizem, lopressor for now  PRN metoprolol for HTN or tachy    RENAL ESRD - T, Th, S  Hyperkalemia -  P:   F/u chem now  Renal to see  Consider HD with AMS, oversedation F/u ABG   GASTROINTESTINAL Hx ETOH  P:   Pepcid  NPO  Monitor CIWA protocol    HEMATOLOGIC Anemia - mild  P:  SQ heparin  F/u CBC   INFECTIOUS ?HCAP  Recent CDiff  L Knee septic arthritis  P:   BCx2 1/6>>>  Vancomycin 1/6>>>  Zosyn 1/6>>> Flagyl 1/6>>>  Cont IV vanc with HD - planned 6 weeks for septic arthritis  HCAP coverage  Check pct, lactate  Will add flagyl for now for recent CDiff infection since not taking PO's  Once able to  take PO's - resume oral vanc taper -- was on day #2 of Vancomycin 125 mg PO TID for 1 week  ENDOCRINE No active issue  P:   Monitor glucose on chem   NEUROLOGIC AMS - r/t oversedation in setting ESRD now with hypercarbia  P:   Try narcan again x 1 - if mental status improves with this, consider narcan gtt  ?HD  Hold all sedating meds     FAMILY  - Updates: no family available 1/6  - Inter-disciplinary family meet or Palliative Care meeting due by:  1/13    Dirk DressKaty Whiteheart, NP 05/15/2014  4:00 PM Pager: [phone] or [phone]

## 2014-05-16 ENCOUNTER — Inpatient Hospital Stay (HOSPITAL_COMMUNITY): Payer: Medicaid Other

## 2014-05-16 DIAGNOSIS — J189 Pneumonia, unspecified organism: Secondary | ICD-10-CM

## 2014-05-16 DIAGNOSIS — J432 Centrilobular emphysema: Secondary | ICD-10-CM

## 2014-05-16 LAB — BASIC METABOLIC PANEL
Anion gap: 8 (ref 5–15)
BUN: 7 mg/dL (ref 6–23)
CALCIUM: 7.9 mg/dL — AB (ref 8.4–10.5)
CO2: 33 mmol/L — ABNORMAL HIGH (ref 19–32)
Chloride: 98 mEq/L (ref 96–112)
Creatinine, Ser: 2.32 mg/dL — ABNORMAL HIGH (ref 0.50–1.35)
GFR calc Af Amer: 35 mL/min — ABNORMAL LOW (ref >90)
GFR calc non Af Amer: 30 mL/min — ABNORMAL LOW (ref >90)
Glucose, Bld: 82 mg/dL (ref 70–99)
Potassium: 3.5 mmol/L (ref 3.5–5.1)
SODIUM: 139 mmol/L (ref 135–145)

## 2014-05-16 LAB — CBC
HEMATOCRIT: 26.7 % — AB (ref 39.0–52.0)
HEMOGLOBIN: 7.9 g/dL — AB (ref 13.0–17.0)
MCH: 28 pg (ref 26.0–34.0)
MCHC: 29.6 g/dL — ABNORMAL LOW (ref 30.0–36.0)
MCV: 94.7 fL (ref 78.0–100.0)
PLATELETS: 410 10*3/uL — AB (ref 150–400)
RBC: 2.82 MIL/uL — AB (ref 4.22–5.81)
RDW: 16.4 % — AB (ref 11.5–15.5)
WBC: 9.4 10*3/uL (ref 4.0–10.5)

## 2014-05-16 LAB — POCT I-STAT 3, ART BLOOD GAS (G3+)
Acid-Base Excess: 3 mmol/L — ABNORMAL HIGH (ref 0.0–2.0)
BICARBONATE: 29 meq/L — AB (ref 20.0–24.0)
O2 Saturation: 97 %
PCO2 ART: 50.5 mmHg — AB (ref 35.0–45.0)
PO2 ART: 90 mmHg (ref 80.0–100.0)
Patient temperature: 98.2
TCO2: 31 mmol/L (ref 0–100)
pH, Arterial: 7.366 (ref 7.350–7.450)

## 2014-05-16 LAB — MAGNESIUM: Magnesium: 2.1 mg/dL (ref 1.5–2.5)

## 2014-05-16 LAB — GLUCOSE, CAPILLARY
GLUCOSE-CAPILLARY: 105 mg/dL — AB (ref 70–99)
Glucose-Capillary: 153 mg/dL — ABNORMAL HIGH (ref 70–99)
Glucose-Capillary: 73 mg/dL (ref 70–99)
Glucose-Capillary: 78 mg/dL (ref 70–99)
Glucose-Capillary: 88 mg/dL (ref 70–99)

## 2014-05-16 LAB — PROCALCITONIN: Procalcitonin: 9.13 ng/mL

## 2014-05-16 LAB — PHOSPHORUS: Phosphorus: 2.8 mg/dL (ref 2.3–4.6)

## 2014-05-16 MED ORDER — VANCOMYCIN 50 MG/ML ORAL SOLUTION: 125.0000 mg | ORAL | Status: DC

## 2014-05-16 MED ORDER — TRAZODONE 25 MG HALF TABLET
25.0000 mg | ORAL_TABLET | Freq: Every evening | ORAL | Status: DC | PRN
  Administered 2014-05-16 – 2014-05-17 (×2): 25 mg via ORAL
  Filled 2014-05-16 (×3): qty 1

## 2014-05-16 MED ORDER — VANCOMYCIN HCL IN DEXTROSE 750-5 MG/150ML-% IV SOLN
750.0000 mg | INTRAVENOUS | Status: DC
  Administered 2014-05-18: 750 mg via INTRAVENOUS
  Filled 2014-05-16 (×2): qty 150

## 2014-05-16 MED ORDER — TRAMADOL HCL 50 MG PO TABS
50.0000 mg | ORAL_TABLET | Freq: Two times a day (BID) | ORAL | Status: DC | PRN
  Administered 2014-05-16 – 2014-05-17 (×2): 50 mg via ORAL
  Filled 2014-05-16 (×2): qty 1

## 2014-05-16 MED ORDER — DILTIAZEM HCL ER COATED BEADS 180 MG PO CP24
180.0000 mg | ORAL_CAPSULE | Freq: Every day | ORAL | Status: DC
  Administered 2014-05-16 – 2014-05-18 (×3): 180 mg via ORAL
  Filled 2014-05-16 (×3): qty 1

## 2014-05-16 MED ORDER — VANCOMYCIN 50 MG/ML ORAL SOLUTION
125.0000 mg | Freq: Every day | ORAL | Status: DC
  Administered 2014-05-17: 125 mg via ORAL

## 2014-05-16 MED ORDER — METOPROLOL TARTRATE 25 MG PO TABS
25.0000 mg | ORAL_TABLET | Freq: Two times a day (BID) | ORAL | Status: DC
  Administered 2014-05-16 – 2014-05-18 (×4): 25 mg via ORAL
  Filled 2014-05-16 (×6): qty 1

## 2014-05-16 MED ORDER — DARBEPOETIN ALFA 100 MCG/0.5ML IJ SOSY
100.0000 ug | PREFILLED_SYRINGE | INTRAMUSCULAR | Status: DC
  Administered 2014-05-18: 100 ug via INTRAVENOUS
  Filled 2014-05-16: qty 0.5

## 2014-05-16 MED ORDER — VANCOMYCIN 50 MG/ML ORAL SOLUTION
125.0000 mg | Freq: Two times a day (BID) | ORAL | Status: DC
  Administered 2014-05-16 – 2014-05-18 (×5): 125 mg via ORAL
  Filled 2014-05-16 (×7): qty 2.5

## 2014-05-16 MED ORDER — VANCOMYCIN 50 MG/ML ORAL SOLUTION
125.0000 mg | Freq: Four times a day (QID) | ORAL | Status: DC
  Filled 2014-05-16 (×5): qty 2.5

## 2014-05-16 MED ORDER — FAMOTIDINE 20 MG PO TABS
20.0000 mg | ORAL_TABLET | Freq: Every day | ORAL | Status: DC
  Administered 2014-05-16 – 2014-05-18 (×3): 20 mg via ORAL
  Filled 2014-05-16 (×4): qty 1

## 2014-05-16 MED ORDER — METOPROLOL TARTRATE 1 MG/ML IV SOLN
5.0000 mg | INTRAVENOUS | Status: DC | PRN
  Administered 2014-05-16: 5 mg via INTRAVENOUS
  Filled 2014-05-16 (×3): qty 5

## 2014-05-16 MED ORDER — RENA-VITE PO TABS
1.0000 | ORAL_TABLET | Freq: Every day | ORAL | Status: DC
  Administered 2014-05-16 – 2014-05-17 (×3): 1 via ORAL
  Filled 2014-05-16 (×3): qty 1

## 2014-05-16 MED ORDER — OXYCODONE HCL 5 MG PO TABS
5.0000 mg | ORAL_TABLET | Freq: Four times a day (QID) | ORAL | Status: DC | PRN
  Administered 2014-05-16 – 2014-05-18 (×4): 5 mg via ORAL
  Filled 2014-05-16 (×4): qty 1

## 2014-05-16 NOTE — Progress Notes (Signed)
[facility]OC[facility] FOR AM LAB REPLACEMENT ONLY  The patient does not apply for the [facility] Electrolyte Replacment Protocol based on the criteria listed below:    Is GFR >/= 40 ml/min? No.  Patient's GFR today is 30   Abnormal electrolyte(s): K3.5   If a panic level lab has been reported, has the CCM MD in charge been notified? Yes.  .   Physician:  Thea GistV Sood,MD  Ursala Cressy William 05/16/2014 4:43 AM

## 2014-05-16 NOTE — Progress Notes (Signed)
Patient to be transferred to 6 East room 2.  Report called to Oliver HumKadesha Kelly, RN.  Will continue to monitor.

## 2014-05-16 NOTE — Progress Notes (Signed)
Patient refused to continue to wear BIPAP or NRB. Connected pt to [facility] 4lpm. VS 13RR, HR 102, 100% Sa02.

## 2014-05-16 NOTE — Evaluation (Signed)
Physical Therapy Evaluation Patient Details Name: [name] MRN: [id number] DOB: 1960/12/19 Today's Date: 05/16/2014   History of Present Illness  This 54 y.o. male from SNF x 1 day readmitted with lethargy and decreased respirations after recent admission with D/c 1/5 for Lt. knee infection, A-flutter with RVR and ESRD.  Pt underwent Lt knee arthroscopy with I&D and lavage for infection and extensive debridement, chondroplasty of the lateral femoral condyle, removal of loose body, medial and lateral meniscal debridement 12/30.  PMH:  HTN; systolic HF; hyperlipidemia, COPD, PVD; OSA uses CPAP; ETOH abuse; arhritis; chronic LBP; Depression, Hepatitis  Clinical Impression  Pt pleasant but disoriented. Pt with decreased activity tolerance secondary to painful left knee. HR 95-128 with activity with sats 95% on RA. Pt will benefit from acute therapy to maximize mobility, balance, function, LLE strength to maximize independence.    Follow Up Recommendations SNF;Supervision/Assistance - 24 hour    Equipment Recommendations  Rolling walker with 5" wheels    Recommendations for Other Services       Precautions / Restrictions Precautions Precautions: Fall Restrictions LLE Weight Bearing: Weight bearing as tolerated      Mobility  Bed Mobility Overal bed mobility: Needs Assistance Bed Mobility: Supine to Sit     Supine to sit: Supervision     General bed mobility comments: supervision for lines and safety  Transfers     Transfers: Sit to/from Stand Sit to Stand: Supervision         General transfer comment: cues for safety   Ambulation/Gait Ambulation/Gait assistance: Supervision Ambulation Distance (Feet): 20 Feet Assistive device: Rolling walker (2 wheeled) Gait Pattern/deviations: Step-to pattern;Antalgic   Gait velocity interpretation: Below normal speed for age/gender General Gait Details: cues for sequence  Stairs            Wheelchair Mobility     Modified Rankin (Stroke Patients Only)       Balance Overall balance assessment: Needs assistance   Sitting balance-Leahy Scale: Good       Standing balance-Leahy Scale: Fair                               Pertinent Vitals/Pain Pain Score: 8  Pain Location: left knee Pain Intervention(s): Limited activity within patient's tolerance;Patient requesting pain meds-RN notified    Home Living Family/patient expects to be discharged to:: Private residence Living Arrangements: Alone Available Help at Discharge: Family;Available PRN/intermittently Type of Home: Apartment Home Access: Stairs to enter   Entrance Stairs-Number of Steps: 4 Home Layout: One level Home Equipment: Cane - single point Additional Comments: Pt was at SNF x 1 day and states he wants to return home and have someone move in with him but no confirmed caregiver    Prior Function Level of Independence: Independent         Comments: doesn't drive and was caring for himself. Since last admission pt requiring supervision/ assist for ADLs and only walking 50'     Hand Dominance        Extremity/Trunk Assessment   Upper Extremity Assessment: Overall WFL for tasks assessed           Lower Extremity Assessment: LLE deficits/detail   LLE Deficits / Details: at least 3/5 quad strength but did not attempt resistance secondary to pain, unable to flex knee beyond 90 due to pain  Cervical / Trunk Assessment: Normal  Communication   Communication: No difficulties  Cognition Arousal/Alertness: Awake/alert  Behavior During Therapy: WFL for tasks assessed/performed Overall Cognitive Status: Impaired/Different from baseline Area of Impairment: Orientation;Safety/judgement;Memory Orientation Level: Disoriented to;Place;Time   Memory: Decreased short-term memory   Safety/Judgement: Decreased awareness of safety          General Comments      Exercises General Exercises - Lower  Extremity Long Arc Quad: AROM;10 reps;Seated      Assessment/Plan    PT Assessment Patient needs continued PT services  PT Diagnosis Difficulty walking;Acute pain   PT Problem List Decreased strength;Decreased range of motion;Decreased activity tolerance;Decreased balance;Decreased mobility;Decreased safety awareness;Pain  PT Treatment Interventions DME instruction;Gait training;Stair training;Functional mobility training;Therapeutic activities;Therapeutic exercise;Balance training;Patient/family education   PT Goals (Current goals can be found in the Care Plan section) Acute Rehab PT Goals Patient Stated Goal: return home PT Goal Formulation: With patient Time For Goal Achievement: 05/30/14 Potential to Achieve Goals: Good    Frequency Min 3X/week   Barriers to discharge Decreased caregiver support      Co-evaluation               End of Session   Activity Tolerance: Patient tolerated treatment well;Patient limited by pain Patient left: in chair;with call bell/phone within reach Nurse Communication: Mobility status;Precautions         Time: [id number] PT Time Calculation (min) (ACUTE ONLY): 16 min   Charges:   PT Evaluation $Initial PT Evaluation Tier I: 1 Procedure PT Treatments $Gait Training: 8-22 mins   PT G CodesDelorse Lek:        Tabor, Lien Lyman Beth 05/16/2014, 2:33 PM  Delaney MeigsMaija Tabor Jeter Tomey, PT [phone]

## 2014-05-16 NOTE — Progress Notes (Signed)
UR Completed.  [phone]  

## 2014-05-16 NOTE — Progress Notes (Addendum)
PULMONARY / CRITICAL CARE MEDICINE   Name: [name] MRN: [id number] DOB: 14-May-1960    ADMISSION DATE:  05/15/2014 CONSULTATION DATE:  1/6   CHIEF COMPLAINT:  AMS, PNA   INITIAL PRESENTATION:  54yo male active smoker with hx ETOH, uncontrolled HTN, COPD, diastolic CHF, OSA on CPAP, ESRD (T, Th, S) just d/c 1/5 to SNF after admission for septic arthritis L knee, C Diff, AFib RVR.   He was d/c 1/5 to SNF rehab with plans for 6 weeks of IV vanc (on HD days).  On am 1/6 he was found lethargic at SNF, minimal response to narcan.  Was taken to [facility] ER, placed on bipap and tx to [facility].     STUDIES:  1/7 cxr: cardiomegaly, pulmonary venous congestion, mild CHF, bibasilar atelactasis vs infiltrates.  SIGNIFICANT EVENTS: 12/29>> admit to [facility], L knee septic arthritis, AFib RVR 12/30>>>L knee arthroscopy with irrigation/ debridement  1/5>>d/c to SNF rehab  1/6>> found lethargic at SNF, sent to [facility] ER, placed on bipap, tx to [facility] 1/6- HD  REVIEW OF SYSTEMS: denies sob/cp/n/v. Says he is hungry.  SUBJECTIVE:  Denies any complaints. Says he is hungry.  VITAL SIGNS: Temp:  [97.6 F (36.4 C)-98.2 F (36.8 C)] 98.2 F (36.8 C) (01/07 0418) Pulse Rate:  [75-96] 96 (01/07 0600) Resp:  [9-22] 17 (01/07 0623) BP: (111-174)/(66-91) 154/87 mmHg (01/07 0623) SpO2:  [96 %-100 %] 98 % (01/07 0623) FiO2 (%):  [50 %] 50 % (01/07 0400) Weight:  [67.2 kg (148 lb 2.4 oz)-70.1 kg (154 lb 8.7 oz)] 67.7 kg (149 lb 4 oz) (01/07 0500) HEMODYNAMICS:   VENTILATOR SETTINGS: Vent Mode:  [-] BIPAP FiO2 (%):  [50 %] 50 % Set Rate:  [10 bmp-14 bmp] 14 bmp PEEP:  [5 cmH20] 5 cmH20 Pressure Support:  [15 cmH20] 15 cmH20 INTAKE / OUTPUT:  Intake/Output Summary (Last 24 hours) at 05/16/14 0740 Last data filed at 05/16/14 0600  Gross per 24 hour  Intake    635 ml  Output   3000 ml  Net  -2365 ml    PHYSICAL EXAMINATION: General:  Awake, able to answer questions.  Neuro:  Awake, able to follow  commands. CN's grossly intact.  HEENT: Hermann/at. Cardiovascular:  Irregular. No murmurs.  Lungs:  Ctab.  Abdomen:  Round, soft, +bs  Musculoskeletal:  Warm and dry, no sig edema, L knee wrapped, swollen   LABS:   CBC  Recent Labs Lab 05/14/14 1439 05/15/14 1527 05/16/14 0250  WBC 13.2* 12.9* 9.4  HGB 8.4* 7.9* 7.9*  HCT 27.5* 26.4* 26.7*  PLT 508* 388 410*   Coag's No results for input(s): APTT, INR in the last 168 hours. BMET  Recent Labs Lab 05/14/14 1439 05/15/14 1527 05/16/14 0250  NA 128* 136 139  K 5.3* 5.6* 3.5  CL 87* 100 98  CO2 28 23 33*  BUN 53* 28* 7  CREATININE 7.25* 5.28* 2.32*  GLUCOSE 108* 104* 82   Electrolytes  Recent Labs Lab 05/12/14 1000  05/14/14 1439 05/15/14 1527 05/16/14 0250  CALCIUM 8.5  < > 8.7 8.7 7.9*  MG 2.3  --   --  2.6* 2.1  PHOS  --   --  6.8* 6.4* 2.8  < > = values in this interval not displayed. Sepsis Markers  Recent Labs Lab 05/15/14 1600 05/15/14 2225 05/16/14 0250  LATICACIDVEN 0.5  --   --   PROCALCITON  --  7.89 9.13   ABG  Recent Labs Lab 05/15/14 1536  05/15/14 1816  PHART 7.211* 7.263*  PCO2ART 68.3* 58.4*  PO2ART 150.0* 160.0*   Liver Enzymes  Recent Labs Lab 05/12/14 1000 05/14/14 1439 05/15/14 1527  AST 12  --  710*  ALT 11  --  479*  ALKPHOS 108  --  229*  BILITOT 0.8  --  1.0  ALBUMIN 2.1* 2.3* 2.2*   Cardiac Enzymes No results for input(s): TROPONINI, PROBNP in the last 168 hours. Glucose  Recent Labs Lab 05/15/14 1532 05/15/14 1950 05/16/14 0004 05/16/14 0414  GLUCAP 89 78 88 73    Imaging No results found.   ASSESSMENT / PLAN:  PULMONARY Acute respiratory failure - likely 2/2 to opiates in setting ESRD. Also has hx of COPD, decompensated OSA. Also could be CHF as CXR shows vascular congestion. I doubt this is HCAP. CHF COPD  Tobacco abuse  Respiratory acidosis  OSA on CPAP  P:   Neg 2.3 liters benefits him Refused bipap. satting well on 4L o2. Keep on it for  now.  No BIPAP needed F/u ABG  X 1  BD's  Pulmonary hygiene as able  Smoking cessation   CARDIOVASCULAR Hx HTN  Diastolic CHF. EF preserved based on last ECHO 2015.  AFib - during last admit.  Not anticoagulated. Not sure why, likely noncompliance is the issue. Now NSR with PVCs.  P:  Not sure why not anticoagulated - fall risk? Compliance? Will restart today home cardizem and lopressor. Was held yesterday.  RENAL ESRD - T, Th, S  Hyperkalemia 5.6- resolved with HD 1/6 Renal compensation for resp acidosis.   P:   Renal following. Will try to hold HD again until Saturday to keep him on schedule. Benefits from HD for sure  GASTROINTESTINAL Hx ETOH, cdiff treating P:   Pepcid  Start diet today since more awake.  Got banana bag.  Oral vanc restart  HEMATOLOGIC Anemia - mild  P:  Cont aranesp SQ heparin  F/u CBC   INFECTIOUS ?HCAP  Recent CDiff  L Knee septic arthritis PCT elevation somewhat above ESRD range - septic arthritis cause, cdiff, unlikely PNA P:   BCx2 1/6>>>  Vancomycin IV 1/6>>>  Zosyn 1/6>>> stop IV Flagyl 1/6>>> stop Oral vanc 1/7>>>  Cont IV vanc with HD - planned 6 weeks for septic arthritis  I would d/c abx for hcap coverage. This was likely 2/2 to sedation given no fever or wbc count. Also CXR not consistent with infection. This is more likely 2/2 to sedation + some volume overload 2/2 to ESRD/chf. Will switch IV flagyl to resume oral vanc taper -- was on day #2 of Vancomycin 125 mg PO TID for 1 week  ENDOCRINE No active issue  P:   Monitor glucose on chem   NEUROLOGIC AMS - likely 2/2 to oversation. Improved now. Got narcan. P:   Continue to monitor Limit narcs  FAMILY  - Updates: no family available 1/6  - Inter-disciplinary family meet or Palliative Care meeting due by:  1/13  Hyacinth Meekerasrif Ahmed, M.D PGY-1 Internal Medicine Pager [phone]  STAFF NOTE: I, Rory Percyaniel Jaxxon Naeem, MD FACP have personally reviewed patient's available data,  including medical history, events of note, physical examination and test results as part of my evaluation. I have discussed with resident/NP and other care providers such as pharmacist, RN and RRT. In addition, I personally evaluated patient and elicited key findings of: Improved after HD, NO asp noted, neuro back to baseline, HD per renal, dc flagyl, start oral vanc again, cont IV  vanc, to triad, floor  Mcarthur Rossetti. Tyson Alias, MD, FACP Pgr: [phone] [facility] 05/16/2014 8:17 AM

## 2014-05-16 NOTE — Progress Notes (Signed)
S: More alert O:BP 154/87 mmHg  Pulse 96  Temp(Src) 98.2 F (36.8 C) (Axillary)  Resp 17  Wt 67.7 kg (149 lb 4 oz)  SpO2 98%  Intake/Output Summary (Last 24 hours) at 05/16/14 0732 Last data filed at 05/16/14 0600  Gross per 24 hour  Intake    635 ml  Output   3000 ml  Net  -2365 ml   Weight change:  Gen: Awake and alert though still a little groggy CVS:RRR Resp: clear Abd:+ BS NTND Ext: Lt knee wrapped.  Lt AVF + bruit NEURO: CNI Ox3 with some prodding, No asterixis   . antiseptic oral rinse  7 mL Mouth Rinse q12n4p  . chlorhexidine  15 mL Mouth Rinse BID  . famotidine (PEPCID) IV  20 mg Intravenous Q24H  . folic acid  1 mg Intravenous Daily  . heparin  5,000 Units Subcutaneous 3 times per day  . ipratropium-albuterol  3 mL Nebulization Q6H  . metronidazole  500 mg Intravenous Q8H  . piperacillin-tazobactam (ZOSYN)  IV  2.25 g Intravenous Q8H  . thiamine  100 mg Intravenous Daily  . vancomycin  750 mg Intravenous Q T,Th,Sa-HD   Dg Chest Port 1 View  05/16/2014   CLINICAL DATA:  Shortness of breath.  EXAM: PORTABLE CHEST - 1 VIEW  COMPARISON:  05/15/2014.  FINDINGS: Mediastinum hilar structures normal. Cardiomegaly with mild pulmonary vascular prominence. Mild interstitial prominence present. Mild CHF cannot be excluded. Bibasilar atelectasis and/or mild infiltrates. No pleural effusion or pneumothorax.  IMPRESSION: 1. Cardiomegaly with pulmonary venous congestion mild interstitial prominence suggesting mild congestive heart failure. 2. Bibasilar subsegmental atelectasis and/or infiltrates.   Electronically Signed   By: Maisie Fushomas  Register   On: 05/16/2014 07:28   BMET    Component Value Date/Time   NA 139 05/16/2014 0250   K 3.5 05/16/2014 0250   CL 98 05/16/2014 0250   CO2 33* 05/16/2014 0250   GLUCOSE 82 05/16/2014 0250   BUN 7 05/16/2014 0250   CREATININE 2.32* 05/16/2014 0250   CALCIUM 7.9* 05/16/2014 0250   GFRNONAA 30* 05/16/2014 0250   GFRAA 35* 05/16/2014 0250    CBC    Component Value Date/Time   WBC 9.4 05/16/2014 0250   RBC 2.82* 05/16/2014 0250   RBC 2.62* 05/07/2014 2130   HGB 7.9* 05/16/2014 0250   HCT 26.7* 05/16/2014 0250   PLT 410* 05/16/2014 0250   MCV 94.7 05/16/2014 0250   MCH 28.0 05/16/2014 0250   MCHC 29.6* 05/16/2014 0250   RDW 16.4* 05/16/2014 0250   LYMPHSABS 0.7 05/12/2014 1000   MONOABS 0.7 05/12/2014 1000   EOSABS 0.2 05/12/2014 1000   BASOSABS 0.0 05/12/2014 1000     Assessment: 1. Acute resp failure most likely sec to narcotics 2. HTN 3. Anemia on aranesp 4. Hyperkalemia, improved 5.  Pulm edema, improved  Plan: 1. Resume aranesp 2. Clinically much improved.  Will see if can hold off on HD til Sat to get back on schedule  Jayan Raymundo T

## 2014-05-17 ENCOUNTER — Encounter (HOSPITAL_COMMUNITY): Payer: Self-pay | Admitting: General Practice

## 2014-05-17 LAB — BASIC METABOLIC PANEL
Anion gap: 15 (ref 5–15)
BUN: 22 mg/dL (ref 6–23)
CALCIUM: 8.1 mg/dL — AB (ref 8.4–10.5)
CHLORIDE: 94 meq/L — AB (ref 96–112)
CO2: 25 mmol/L (ref 19–32)
Creatinine, Ser: 4.7 mg/dL — ABNORMAL HIGH (ref 0.50–1.35)
GFR calc non Af Amer: 13 mL/min — ABNORMAL LOW (ref >90)
GFR, EST AFRICAN AMERICAN: 15 mL/min — AB (ref >90)
Glucose, Bld: 95 mg/dL (ref 70–99)
Potassium: 3.1 mmol/L — ABNORMAL LOW (ref 3.5–5.1)
Sodium: 134 mmol/L — ABNORMAL LOW (ref 135–145)

## 2014-05-17 LAB — PROCALCITONIN: PROCALCITONIN: 12.36 ng/mL

## 2014-05-17 LAB — MAGNESIUM: Magnesium: 2.2 mg/dL (ref 1.5–2.5)

## 2014-05-17 MED ORDER — VITAMIN B-1 100 MG PO TABS
100.0000 mg | ORAL_TABLET | Freq: Every day | ORAL | Status: DC
  Administered 2014-05-18: 100 mg via ORAL
  Filled 2014-05-17: qty 1

## 2014-05-17 MED ORDER — FOLIC ACID 1 MG PO TABS
1.0000 mg | ORAL_TABLET | Freq: Every day | ORAL | Status: DC
  Administered 2014-05-18: 1 mg via ORAL
  Filled 2014-05-17: qty 1

## 2014-05-17 MED ORDER — POTASSIUM CHLORIDE CRYS ER 20 MEQ PO TBCR
20.0000 meq | EXTENDED_RELEASE_TABLET | Freq: Once | ORAL | Status: AC
  Administered 2014-05-17: 20 meq via ORAL
  Filled 2014-05-17: qty 1

## 2014-05-17 NOTE — Progress Notes (Signed)
[facility] PROGRESS NOTE  [name] ZOX:[id number] DOB: Jan 12, 1961 DOA: 05/15/2014 PCP: Wilmer Floor., MD  Assessment/Plan: 54yo male active smoker with hx ETOH, uncontrolled HTN, COPD, diastolic CHF, OSA on CPAP, ESRD (T, Th, S) just d/c 1/5 to SNF after admission for septic arthritis L knee, C Diff, AFib RVR. He was d/c 1/5 to SNF rehab with plans for 6 weeks of IV vanc (on HD days). On am 1/6 he was found lethargic at SNF, minimal response to narcan. Was taken to [facility] ER, placed on bipap and tx to [facility].   1-Acute Respiratory Failure; Likely secondary to opioid in setting ESRD. History of COPD, decompensated OSA. Component of Heart failure, pulmonary edema.  -improved on BIPAP.  -Continue with negative balance.   2-Diastolic Heart Failure, A fib;  Continue with dialysis for negative balance.  Continue with metoprolol and Cardizem.  Was not on anticoagulation previously due to history alcoholism, medications compliance and anemia. His episode of tachycardia, a flutter are related to acute illness. He will need to follow up with cardiology for further management  3-ESRD;  Continue with dialysis. Plan for dialysis tomorrow.   VT; replete k, continue with metoprolol. Mg at 2.2  Encephalopathy; likely secondary to oversedation. Resolved. Received narcan.   Recent C diff; On vancomycin day 3 of 125 mg PO TID for 1 week.   Anemia; continue with aranesp.   Left Knee septic arthritis;  Continue with IV vancomycin, planned for 6 weeks.     Code Status: full code.  Family Communication: care discussed with patient.  Disposition Plan: SNF 1-09   Consultants:  CCM  Nephrologist  Procedures: Events.  12/29>> admit to [facility], L knee septic arthritis, AFib RVR 12/30>>>L knee arthroscopy with irrigation/ debridement  1/5>>d/c to SNF rehab  1/6>> found lethargic at SNF, sent to [facility] ER, placed on bipap, tx to [facility] 1/6-  HD  Antibiotics:    HPI/Subjective: He is breathing better, complaining of left knee pain. He knows we have to be careful with pain medications to avoid oversedation.  He has notice decreased vision while reading. He doesn't have reading glasses with him. He decline MRI brain.   Objective: Filed Vitals:   05/17/14 0500  BP: 138/80  Pulse: 95  Temp: 98.4 F (36.9 C)  Resp: 18    Intake/Output Summary (Last 24 hours) at 05/17/14 0735 Last data filed at 05/16/14 1200  Gross per 24 hour  Intake     50 ml  Output      0 ml  Net     50 ml   Filed Weights   05/15/14 2152 05/16/14 0500 05/16/14 2142  Weight: 67.2 kg (148 lb 2.4 oz) 67.7 kg (149 lb 4 oz) 70.2 kg (154 lb 12.2 oz)    Exam:   General:  Alert , sitting in the bed in no distress.   Cardiovascular: S 1, S 2 RRR  Respiratory: CTA  Abdomen: BS present, soft, nt  Musculoskeletal: trace edema.   Data Reviewed: Basic Metabolic Panel:  Recent Labs Lab 05/12/14 1000 05/14/14 0409 05/14/14 1439 05/15/14 1527 05/16/14 0250  NA 133* 129* 128* 136 139  K 4.3 5.5* 5.3* 5.6* 3.5  CL 94* 91* 87* 100 98  CO2 28 26 28 23  33*  GLUCOSE 105* 111* 108* 104* 82  BUN 26* 49* 53* 28* 7  CREATININE 4.32* 6.74* 7.25* 5.28* 2.32*  CALCIUM 8.5 9.0 8.7 8.7 7.9*  MG 2.3  --   --  2.6*  2.1  PHOS  --   --  6.8* 6.4* 2.8   Liver Function Tests:  Recent Labs Lab 05/12/14 1000 05/14/14 1439 05/15/14 1527  AST 12  --  710*  ALT 11  --  479*  ALKPHOS 108  --  229*  BILITOT 0.8  --  1.0  PROT 5.8*  --  5.8*  ALBUMIN 2.1* 2.3* 2.2*   No results for input(s): LIPASE, AMYLASE in the last 168 hours. No results for input(s): AMMONIA in the last 168 hours. CBC:  Recent Labs Lab 05/12/14 1000 05/14/14 0409 05/14/14 1439 05/15/14 1527 05/16/14 0250  WBC 15.7* 13.9* 13.2* 12.9* 9.4  NEUTROABS 14.1*  --   --   --   --   HGB 8.6* 9.0* 8.4* 7.9* 7.9*  HCT 28.5* 29.8* 27.5* 26.4* 26.7*  MCV 94.4 97.1 96.2 97.4 94.7   PLT 491* 472* 508* 388 410*   Cardiac Enzymes: No results for input(s): CKTOTAL, CKMB, CKMBINDEX, TROPONINI in the last 168 hours. BNP (last 3 results)  Recent Labs  02/09/14 1425  PROBNP >70000.0*   CBG:  Recent Labs Lab 05/16/14 0004 05/16/14 0414 05/16/14 0817 05/16/14 1202 05/16/14 1607  GLUCAP 88 73 78 105* 153*    Recent Results (from the past 240 hour(s))  MRSA PCR Screening     Status: None   Collection Time: 05/07/14  6:41 PM  Result Value Ref Range Status   MRSA by PCR NEGATIVE NEGATIVE Final    Comment:        The GeneXpert MRSA Assay (FDA approved for NASAL specimens only), is one component of a comprehensive MRSA colonization surveillance program. It is not intended to diagnose MRSA infection nor to guide or monitor treatment for MRSA infections.   Culture, blood (routine x 2)     Status: None   Collection Time: 05/07/14  9:30 PM  Result Value Ref Range Status   Specimen Description BLOOD RIGHT ARM  Final   Special Requests BOTTLES DRAWN AEROBIC AND ANAEROBIC 10CC  Final   Culture   Final    NO GROWTH 5 DAYS Performed at [facility]    Report Status 05/14/2014 FINAL  Final  Culture, blood (routine x 2)     Status: None   Collection Time: 05/07/14  9:40 PM  Result Value Ref Range Status   Specimen Description BLOOD RIGHT HAND  Final   Special Requests BOTTLES DRAWN AEROBIC AND ANAEROBIC 5CC  Final   Culture   Final    NO GROWTH 5 DAYS Performed at [facility]    Report Status 05/14/2014 FINAL  Final  Anaerobic culture     Status: None   Collection Time: 05/08/14  6:38 PM  Result Value Ref Range Status   Specimen Description SYNOVIAL FLUID KNEE LEFT  Final   Special Requests SPECIMEN B  Final   Gram Stain   Final    MODERATE WBC PRESENT,BOTH PMN AND MONONUCLEAR NO ORGANISMS SEEN Performed at [facility]    Culture   Final    NO ANAEROBES ISOLATED Performed at [facility]    Report Status  05/13/2014 FINAL  Final  Body fluid culture     Status: None   Collection Time: 05/08/14  6:38 PM  Result Value Ref Range Status   Specimen Description SYNOVIAL FLUID KNEE LEFT  Final   Special Requests SPECIMEN B  Final   Gram Stain   Final    MODERATE WBC PRESENT,BOTH PMN AND MONONUCLEAR  NO ORGANISMS SEEN Performed at [facility]    Culture   Final    NO GROWTH 3 DAYS Performed at [facility]    Report Status 05/12/2014 FINAL  Final  Anaerobic culture     Status: None   Collection Time: 05/08/14  6:42 PM  Result Value Ref Range Status   Specimen Description SYNOVIAL KNEE LEFT  Final   Special Requests SPECIMEN C  Final   Gram Stain   Final    MODERATE WBC PRESENT,BOTH PMN AND MONONUCLEAR NO ORGANISMS SEEN Performed at [facility]    Culture   Final    NO ANAEROBES ISOLATED Performed at [facility]    Report Status 05/13/2014 FINAL  Final  Body fluid culture     Status: None   Collection Time: 05/08/14  6:42 PM  Result Value Ref Range Status   Specimen Description SYNOVIAL KNEE LEFT  Final   Special Requests SPECIMEN C  Final   Gram Stain   Final    MODERATE WBC PRESENT,BOTH PMN AND MONONUCLEAR NO ORGANISMS SEEN Performed at [facility]    Culture   Final    NO GROWTH 3 DAYS Performed at [facility]    Report Status 05/12/2014 FINAL  Final  Anaerobic culture     Status: None   Collection Time: 05/08/14  6:44 PM  Result Value Ref Range Status   Specimen Description SYNOVIAL FLUID KNEE LEFT  Final   Special Requests SPECIMEN D  Final   Gram Stain   Final    MODERATE WBC PRESENT,BOTH PMN AND MONONUCLEAR NO ORGANISMS SEEN Performed at [facility]    Culture   Final    NO ANAEROBES ISOLATED Performed at [facility]    Report Status 05/13/2014 FINAL  Final  Body fluid culture     Status: None   Collection Time: 05/08/14  6:44 PM  Result Value Ref Range Status   Specimen Description  SYNOVIAL FLUID KNEE LEFT  Final   Special Requests SPECIMEN D  Final   Gram Stain   Final    MODERATE WBC PRESENT,BOTH PMN AND MONONUCLEAR NO ORGANISMS SEEN Performed at [facility]    Culture   Final    NO GROWTH 3 DAYS Performed at [facility]    Report Status 05/12/2014 FINAL  Final  Anaerobic culture     Status: None   Collection Time: 05/08/14  6:55 PM  Result Value Ref Range Status   Specimen Description SYNOVIAL FLUID KNEE LEFT  Final   Special Requests SPECIMEN E  Final   Gram Stain   Final    MODERATE WBC PRESENT,BOTH PMN AND MONONUCLEAR NO ORGANISMS SEEN Performed at [facility]    Culture   Final    NO ANAEROBES ISOLATED Performed at [facility]    Report Status 05/13/2014 FINAL  Final  Body fluid culture     Status: None   Collection Time: 05/08/14  6:55 PM  Result Value Ref Range Status   Specimen Description SYNOVIAL FLUID KNEE LEFT  Final   Special Requests SPECIMEN E  Final   Gram Stain   Final    MODERATE WBC PRESENT,BOTH PMN AND MONONUCLEAR NO ORGANISMS SEEN Performed at [facility]    Culture   Final    NO GROWTH 3 DAYS Performed at [facility]    Report Status 05/12/2014 FINAL  Final  Clostridium Difficile by PCR  Status: Abnormal   Collection Time: 05/11/14  7:30 PM  Result Value Ref Range Status   C difficile by pcr POSITIVE (A) NEGATIVE Final    Comment: CRITICAL RESULT CALLED TO, READ BACK BY AND VERIFIED WITH: L.HOWELL,RN 05/12/14 @1103  BY V.WILKINS   MRSA PCR Screening     Status: None   Collection Time: 05/15/14  4:21 PM  Result Value Ref Range Status   MRSA by PCR NEGATIVE NEGATIVE Final    Comment:        The GeneXpert MRSA Assay (FDA approved for NASAL specimens only), is one component of a comprehensive MRSA colonization surveillance program. It is not intended to diagnose MRSA infection nor to guide or monitor treatment for MRSA infections.      Studies: Dg Chest  Port 1 View  05/16/2014   CLINICAL DATA:  Shortness of breath.  EXAM: PORTABLE CHEST - 1 VIEW  COMPARISON:  05/15/2014.  FINDINGS: Mediastinum hilar structures normal. Cardiomegaly with mild pulmonary vascular prominence. Mild interstitial prominence present. Mild CHF cannot be excluded. Bibasilar atelectasis and/or mild infiltrates. No pleural effusion or pneumothorax.  IMPRESSION: 1. Cardiomegaly with pulmonary venous congestion mild interstitial prominence suggesting mild congestive heart failure. 2. Bibasilar subsegmental atelectasis and/or infiltrates.   Electronically Signed   By: Maisie Fushomas  Register   On: 05/16/2014 07:28    Scheduled Meds: . antiseptic oral rinse  7 mL Mouth Rinse q12n4p  . chlorhexidine  15 mL Mouth Rinse BID  . [START ON 05/18/2014] darbepoetin (ARANESP) injection - DIALYSIS  100 mcg Intravenous Q Sat-HD  . diltiazem  180 mg Oral Daily  . famotidine  20 mg Oral QHS  . folic acid  1 mg Intravenous Daily  . heparin  5,000 Units Subcutaneous 3 times per day  . ipratropium-albuterol  3 mL Nebulization Q6H  . metoprolol tartrate  25 mg Oral BID  . multivitamin  1 tablet Oral QHS  . thiamine  100 mg Intravenous Daily  . [START ON 05/29/2014] vancomycin  125 mg Oral QODAY  . [START ON 06/06/2014] vancomycin  125 mg Oral Q3 days  . vancomycin  125 mg Oral BID  . [START ON 05/22/2014] vancomycin  125 mg Oral Daily  . [START ON 05/18/2014] vancomycin  750 mg Intravenous Q T,Th,Sa-HD   Continuous Infusions: . sodium chloride 100 mL/hr at 05/17/14 0703    Active Problems:   COPD (chronic obstructive pulmonary disease)   OSA (obstructive sleep apnea)   Clostridium difficile colitis   Septic joint of left knee joint   End-stage renal disease on hemodialysis   HCAP (healthcare-associated pneumonia)   Acidosis   Acute respiratory failure with hypercapnia    Time spent: 35 minutes.     Hartley Barefootegalado, Belkys A  [facility] Pager [phone]. If 7PM-7AM, please contact  night-coverage at [email or web address], password [facility] [email or web address] 05/17/2014, 7:35 AM  LOS: 2 days

## 2014-05-17 NOTE — Discharge Summary (Signed)
Physician Discharge Summary  [name] ZOX:[id number] DOB: 1960/05/17 DOA: 05/15/2014  PCP: Wilmer Floor., MD  Admit date: 05/15/2014 Discharge date: 05/17/2014  Time spent: 35 minutes  Recommendations for Outpatient Follow-up:  1. Needs to follow up with ID clinic in 2 weeks.  2. Needs to follow up with Dr Dion Saucier in 1 week 3. Careful with narcotics, opioid to avoid oversedation  Discharge Diagnoses:    Encephalopathy secondary to sedatives.     Acute respiratory failure with hypercapnia   COPD (chronic obstructive pulmonary disease)   OSA (obstructive sleep apnea)   Clostridium difficile colitis   Septic joint of left knee joint   End-stage renal disease on hemodialysis   HCAP (healthcare-associated pneumonia)   Acidosis    Discharge Condition: Stable  Diet recommendation: renal diet.   Filed Weights   05/15/14 2152 05/16/14 0500 05/16/14 2142  Weight: 67.2 kg (148 lb 2.4 oz) 67.7 kg (149 lb 4 oz) 70.2 kg (154 lb 12.2 oz)    History of present illness:  54yo male active smoker with hx ETOH, uncontrolled HTN, COPD, diastolic CHF, OSA on CPAP, ESRD (T, Th, S) just d/c 1/5 to SNF after admission for septic arthritis L knee, C Diff, AFib RVR. He was d/c 1/5 to SNF rehab with plans for 6 weeks of IV vanc (on HD days). On am 1/6 he was found lethargic at SNF, minimal response to narcan. Was taken to [facility] ER, placed on bipap and tx to [facility].  Hospital Course:  1-Acute Respiratory Failure; Likely secondary to opioid in setting ESRD. History of COPD, decompensated OSA. Component of Heart failure, pulmonary edema.  -improved on BIPAP.  -Continue with negative balance.   2-Diastolic Heart Failure, A fib;  Continue with dialysis for negative balance.  Continue with metoprolol and Cardizem.  Was not on anticoagulation previously due to history alcoholism, medications compliance and anemia. His episode of tachycardia, a flutter are related to acute illness. He will  need to follow up with cardiology for further management  3-ESRD;  Continue with dialysis. Plan for dialysis tomorrow.   VT; replete k, continue with metoprolol. Mg at 2.2  Encephalopathy; likely secondary to oversedation. Resolved. Received narcan.   Recent C diff; On vancomycin day 3 of 125 mg PO TID for 1 week.   Anemia; continue with aranesp.   Left Knee septic arthritis;  Continue with IV vancomycin, planned for 6 weeks.   Procedures:  dialysis  Consultations:  Nephrologist.  CCM  Discharge Exam: Filed Vitals:   05/17/14 0914  BP: 159/94  Pulse: 96  Temp: 97.9 F (36.6 C)  Resp: 17    General: no distress.  Cardiovascular: S 1, S 2 RRR Respiratory: CTA  Discharge Instructions    Current Discharge Medication List    CONTINUE these medications which have NOT CHANGED   Details  albuterol (PROVENTIL HFA;VENTOLIN HFA) 108 (90 BASE) MCG/ACT inhaler Inhale 2 puffs into the lungs every 6 (six) hours as needed for wheezing or shortness of breath.    ALPRAZolam (XANAX) 0.5 MG tablet Take 1 tablet (0.5 mg total) by mouth 3 (three) times daily as needed for anxiety. Qty: 20 tablet, Refills: 0    aspirin EC 325 MG EC tablet Take 1 tablet (325 mg total) by mouth daily. Qty: 30 tablet, Refills: 2    budesonide-formoterol (SYMBICORT) 80-4.5 MCG/ACT inhaler Inhale 2 puffs into the lungs 2 (two) times daily. Qty: 1 Inhaler, Refills: 1    calcium acetate (PHOSLO) 667 MG capsule  Take 1 capsule (667 mg total) by mouth 3 (three) times daily with meals. Qty: 90 capsule, Refills: 1    calcium carbonate (TUMS - DOSED IN MG ELEMENTAL CALCIUM) 500 MG chewable tablet Chew 1 tablet (200 mg of elemental calcium total) by mouth 3 (three) times daily as needed for indigestion or heartburn. Qty: 90 tablet, Refills: 1    diltiazem (CARDIZEM CD) 180 MG 24 hr capsule Take 1 capsule (180 mg total) by mouth daily. Qty: 60 capsule, Refills: 3    escitalopram (LEXAPRO) 10 MG tablet  Take 1 tablet (10 mg total) by mouth daily. Qty: 30 tablet, Refills: 2    famotidine (PEPCID) 20 MG tablet Take 1 tablet (20 mg total) by mouth 2 (two) times daily. Qty: 60 tablet, Refills: 2    gabapentin (NEURONTIN) 100 MG capsule Take 2 capsules (200 mg total) by mouth 2 (two) times daily. Qty: 120 capsule, Refills: 0    metoprolol tartrate (LOPRESSOR) 25 MG tablet Take 1 tablet (25 mg total) by mouth 2 (two) times daily. Qty: 60 tablet, Refills: 2    morphine (MS CONTIN) 15 MG 12 hr tablet Take 1 tablet (15 mg total) by mouth every 12 (twelve) hours. Qty: 20 tablet, Refills: 0    ondansetron (ZOFRAN-ODT) 4 MG disintegrating tablet Take 4 mg by mouth every 6 (six) hours as needed for nausea.  Refills: 0    oxyCODONE (OXY IR/ROXICODONE) 5 MG immediate release tablet Take 1 tablet (5 mg total) by mouth every 6 (six) hours as needed. Qty: 20 tablet, Refills: 0    roflumilast (DALIRESP) 500 MCG TABS tablet Take 500 mcg by mouth daily.    senna (SENOKOT) 8.6 MG TABS tablet Take 1 tablet (8.6 mg total) by mouth 2 (two) times daily. Qty: 120 each, Refills: 0    thiamine 100 MG tablet Take 1 tablet (100 mg total) by mouth daily. Qty: 30 tablet, Refills: 3    tiotropium (SPIRIVA) 18 MCG inhalation capsule Place 18 mcg into inhaler and inhale daily.    traZODone (DESYREL) 25 mg TABS tablet Take 0.5 tablets (25 mg total) by mouth at bedtime as needed for sleep. Qty: 30 tablet, Refills: 0    !! vancomycin (VANCOCIN) 50 mg/mL oral solution Take 2.5 mLs (125 mg total) by mouth 4 (four) times daily. Qty: 70 mL, Refills: 0    Vancomycin (VANCOCIN) 750 MG/150ML SOLN Inject 150 mLs (750 mg total) into the vein Every Tuesday,Thursday,and Saturday with dialysis. Qty: 150 mL, Refills: 0    vitamin C (ASCORBIC ACID) 500 MG tablet Take 500 mg by mouth daily.    !! vancomycin (VANCOCIN) 50 mg/mL oral solution Take 2.5 mLs (125 mg total) by mouth 2 (two) times daily. Qty: 35 mL, Refills: 0     !! vancomycin (VANCOCIN) 50 mg/mL oral solution Take 2.5 mLs (125 mg total) by mouth daily. Qty: 17.5 mL, Refills: 0    !! vancomycin (VANCOCIN) 50 mg/mL oral solution Take 2.5 mLs (125 mg total) by mouth every other day. Qty: 10 mL, Refills: 0    !! vancomycin (VANCOCIN) 50 mg/mL oral solution Take 2.5 mLs (125 mg total) by mouth every 3 (three) days. Qty: 5 mL, Refills: 0     !! - Potential duplicate medications found. Please discuss with provider.     Allergies  Allergen Reactions  . Tylenol [Acetaminophen] Nausea Only      The results of significant diagnostics from this hospitalization (including imaging, microbiology, ancillary and laboratory) are listed below  for reference.    Significant Diagnostic Studies: Mr Knee Left  Wo Contrast  05/08/2014   CLINICAL DATA:  Dialysis patient with joint effusion. Evaluate for infection versus inflammatory process.  EXAM: MRI OF THE LEFT KNEE WITHOUT CONTRAST  TECHNIQUE: Multiplanar, multisequence MR imaging of the knee was performed. No intravenous contrast was administered.  COMPARISON:  Lower extremity venous Doppler ultrasound 04/08/2014. Radiographs 05/08/2014.  FINDINGS: MENISCI  Medial meniscus: Minimal undersurface irregularity in the posterior horn without discrete tear or displaced meniscal fragment.  Lateral meniscus:  Intact with normal morphology.  LIGAMENTS  Cruciates:  Intact.  Collaterals:  Intact.  CARTILAGE  Patellofemoral: There is mild patellar cartilage surface irregularity and thinning without full-thickness defect. There is lateral trochlear chondromalacia and subchondral collapse related to an underlying bony infarct, best seen on the sagittal images.  Medial:  Preserved.  Lateral:  Preserved.  Joint: There is a moderate to large complex knee joint effusion with diffuse synovial irregularity. There are multiple serpiginous filling defects within the suprapatellar recess. No discrete loose bodies observed.  Popliteal Fossa:  There is a large complex Baker's cyst measuring up to 7.9 cm cephalocaudad and 4.7 cm transverse. There is subcutaneous edema surrounding the knee, greatest laterally. No other focal fluid collection is demonstrated.  Extensor Mechanism: Intact. There is mild edema within Hoffa's fat.  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 As above, there is some subchondral collapse of the lateral femoral condyle anteriorly. The bone infarct involving the posterior aspect of the medial femoral condyle is associated with surrounding marrow edema and may be subacute. There is no evidence of acute fracture or bone destruction.  IMPRESSION: 1. Large complex knee joint effusion and Baker's cyst consistent with synovitis. Joint aspiration recommended if there is clinical concern of infection. No evidence of osteomyelitis. 2. 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 There is subchondral collapse of the lateral femoral condyle anteriorly with associated chondral irregularity. 3. Intact menisci and ligaments.   Electronically Signed   By: Roxy Horseman M.D.   On: 05/08/2014 11:08   Dg Chest Port 1 View  05/16/2014   CLINICAL DATA:  Shortness of breath.  EXAM: PORTABLE CHEST - 1 VIEW  COMPARISON:  05/15/2014.  FINDINGS: Mediastinum hilar structures normal. Cardiomegaly with mild pulmonary vascular prominence. Mild interstitial prominence present. Mild CHF cannot be excluded. Bibasilar atelectasis and/or mild infiltrates. No pleural effusion or pneumothorax.  IMPRESSION: 1. Cardiomegaly with pulmonary venous congestion mild interstitial prominence suggesting mild congestive heart failure. 2. Bibasilar subsegmental atelectasis and/or infiltrates.   Electronically Signed   By: Maisie Fus  Register   On: 05/16/2014 07:28   Dg Knee Complete 4 Views Left  05/08/2014   CLINICAL DATA:  54 year old male with left-sided knee pain. No known injury.  EXAM:  LEFT KNEE - COMPLETE 4+ VIEW  COMPARISON:  No priors.  FINDINGS: In the lateral femoral condyle and supracondylar region there are irregular areas of sclerosis. The possibility of chondroid matrix is not excluded in these regions. The articular surface of the lateral femoral condyle appears intact without definite depression. There may be some overlying periosteal reaction. No displaced fracture, subluxation or dislocation.  IMPRESSION: 1. Large area of sclerosis in the lateral femoral condyle and supracondylar region. The overall appearance of this is favored to reflect a large bone infarct, however, the possibility of a chondroid lesion is not excluded, particularly in light of the potential periosteal reaction. Further characterization with  nonemergent MRI of the knee with and without IV gadolinium in the near future is recommended.   Electronically Signed   By: Trudie Reed M.D.   On: 05/08/2014 01:00    Microbiology: Recent Results (from the past 240 hour(s))  MRSA PCR Screening     Status: None   Collection Time: 05/07/14  6:41 PM  Result Value Ref Range Status   MRSA by PCR NEGATIVE NEGATIVE Final    Comment:        The GeneXpert MRSA Assay (FDA approved for NASAL specimens only), is one component of a comprehensive MRSA colonization surveillance program. It is not intended to diagnose MRSA infection nor to guide or monitor treatment for MRSA infections.   Culture, blood (routine x 2)     Status: None   Collection Time: 05/07/14  9:30 PM  Result Value Ref Range Status   Specimen Description BLOOD RIGHT ARM  Final   Special Requests BOTTLES DRAWN AEROBIC AND ANAEROBIC 10CC  Final   Culture   Final    NO GROWTH 5 DAYS Performed at [facility]    Report Status 05/14/2014 FINAL  Final  Culture, blood (routine x 2)     Status: None   Collection Time: 05/07/14  9:40 PM  Result Value Ref Range Status   Specimen Description BLOOD RIGHT HAND  Final   Special Requests  BOTTLES DRAWN AEROBIC AND ANAEROBIC 5CC  Final   Culture   Final    NO GROWTH 5 DAYS Performed at [facility]    Report Status 05/14/2014 FINAL  Final  Anaerobic culture     Status: None   Collection Time: 05/08/14  6:38 PM  Result Value Ref Range Status   Specimen Description SYNOVIAL FLUID KNEE LEFT  Final   Special Requests SPECIMEN B  Final   Gram Stain   Final    MODERATE WBC PRESENT,BOTH PMN AND MONONUCLEAR NO ORGANISMS SEEN Performed at [facility]    Culture   Final    NO ANAEROBES ISOLATED Performed at [facility]    Report Status 05/13/2014 FINAL  Final  Body fluid culture     Status: None   Collection Time: 05/08/14  6:38 PM  Result Value Ref Range Status   Specimen Description SYNOVIAL FLUID KNEE LEFT  Final   Special Requests SPECIMEN B  Final   Gram Stain   Final    MODERATE WBC PRESENT,BOTH PMN AND MONONUCLEAR NO ORGANISMS SEEN Performed at [facility]    Culture   Final    NO GROWTH 3 DAYS Performed at [facility]    Report Status 05/12/2014 FINAL  Final  Anaerobic culture     Status: None   Collection Time: 05/08/14  6:42 PM  Result Value Ref Range Status   Specimen Description SYNOVIAL KNEE LEFT  Final   Special Requests SPECIMEN C  Final   Gram Stain   Final    MODERATE WBC PRESENT,BOTH PMN AND MONONUCLEAR NO ORGANISMS SEEN Performed at [facility]    Culture   Final    NO ANAEROBES ISOLATED Performed at [facility]    Report Status 05/13/2014 FINAL  Final  Body fluid culture     Status: None   Collection Time: 05/08/14  6:42 PM  Result Value Ref Range Status   Specimen Description SYNOVIAL KNEE LEFT  Final   Special Requests SPECIMEN C  Final   Gram Stain   Final  MODERATE WBC PRESENT,BOTH PMN AND MONONUCLEAR NO ORGANISMS SEEN Performed at [facility]    Culture   Final    NO GROWTH 3 DAYS Performed at [facility]    Report Status 05/12/2014 FINAL   Final  Anaerobic culture     Status: None   Collection Time: 05/08/14  6:44 PM  Result Value Ref Range Status   Specimen Description SYNOVIAL FLUID KNEE LEFT  Final   Special Requests SPECIMEN D  Final   Gram Stain   Final    MODERATE WBC PRESENT,BOTH PMN AND MONONUCLEAR NO ORGANISMS SEEN Performed at [facility]    Culture   Final    NO ANAEROBES ISOLATED Performed at [facility]    Report Status 05/13/2014 FINAL  Final  Body fluid culture     Status: None   Collection Time: 05/08/14  6:44 PM  Result Value Ref Range Status   Specimen Description SYNOVIAL FLUID KNEE LEFT  Final   Special Requests SPECIMEN D  Final   Gram Stain   Final    MODERATE WBC PRESENT,BOTH PMN AND MONONUCLEAR NO ORGANISMS SEEN Performed at [facility]    Culture   Final    NO GROWTH 3 DAYS Performed at [facility]    Report Status 05/12/2014 FINAL  Final  Anaerobic culture     Status: None   Collection Time: 05/08/14  6:55 PM  Result Value Ref Range Status   Specimen Description SYNOVIAL FLUID KNEE LEFT  Final   Special Requests SPECIMEN E  Final   Gram Stain   Final    MODERATE WBC PRESENT,BOTH PMN AND MONONUCLEAR NO ORGANISMS SEEN Performed at [facility]    Culture   Final    NO ANAEROBES ISOLATED Performed at [facility]    Report Status 05/13/2014 FINAL  Final  Body fluid culture     Status: None   Collection Time: 05/08/14  6:55 PM  Result Value Ref Range Status   Specimen Description SYNOVIAL FLUID KNEE LEFT  Final   Special Requests SPECIMEN E  Final   Gram Stain   Final    MODERATE WBC PRESENT,BOTH PMN AND MONONUCLEAR NO ORGANISMS SEEN Performed at [facility]    Culture   Final    NO GROWTH 3 DAYS Performed at [facility]    Report Status 05/12/2014 FINAL  Final  Clostridium Difficile by PCR     Status: Abnormal   Collection Time: 05/11/14  7:30 PM  Result Value Ref Range Status   C difficile by pcr  POSITIVE (A) NEGATIVE Final    Comment: CRITICAL RESULT CALLED TO, READ BACK BY AND VERIFIED WITH: L.HOWELL,RN 05/12/14 @1103  BY V.WILKINS   MRSA PCR Screening     Status: None   Collection Time: 05/15/14  4:21 PM  Result Value Ref Range Status   MRSA by PCR NEGATIVE NEGATIVE Final    Comment:        The GeneXpert MRSA Assay (FDA approved for NASAL specimens only), is one component of a comprehensive MRSA colonization surveillance program. It is not intended to diagnose MRSA infection nor to guide or monitor treatment for MRSA infections.   Culture, blood (routine x 2)     Status: None (Preliminary result)   Collection Time: 05/15/14  5:45 PM  Result Value Ref Range Status   Specimen Description BLOOD HEMODIALYSIS CATHETER  Final   Special Requests BOTTLES DRAWN AEROBIC AND ANAEROBIC  Final   Culture   Final           BLOOD CULTURE RECEIVED NO GROWTH TO DATE CULTURE WILL BE HELD FOR 5 DAYS BEFORE ISSUING A FINAL NEGATIVE REPORT Performed at [facility]    Report Status PENDING  Incomplete  Culture, blood (routine x 2)     Status: None (Preliminary result)   Collection Time: 05/15/14  6:00 PM  Result Value Ref Range Status   Specimen Description BLOOD HEMODIALYSIS CATHETER  Final   Special Requests BOTTLES DRAWN AEROBIC AND ANAEROBIC 10CC  Final   Culture   Final           BLOOD CULTURE RECEIVED NO GROWTH TO DATE CULTURE WILL BE HELD FOR 5 DAYS BEFORE ISSUING A FINAL NEGATIVE REPORT Performed at [facility]    Report Status PENDING  Incomplete     Labs: Basic Metabolic Panel:  Recent Labs Lab 05/12/14 1000 05/14/14 0409 05/14/14 1439 05/15/14 1527 05/16/14 0250 05/17/14 0611  NA 133* 129* 128* 136 139 134*  K 4.3 5.5* 5.3* 5.6* 3.5 3.1*  CL 94* 91* 87* 100 98 94*  CO2 28 26 28 23  33* 25  GLUCOSE 105* 111* 108* 104* 82 95  BUN 26* 49* 53* 28* 7 22  CREATININE 4.32* 6.74* 7.25* 5.28* 2.32* 4.70*  CALCIUM 8.5 9.0 8.7 8.7 7.9* 8.1*  MG 2.3   --   --  2.6* 2.1 2.2  PHOS  --   --  6.8* 6.4* 2.8  --    Liver Function Tests:  Recent Labs Lab 05/12/14 1000 05/14/14 1439 05/15/14 1527  AST 12  --  710*  ALT 11  --  479*  ALKPHOS 108  --  229*  BILITOT 0.8  --  1.0  PROT 5.8*  --  5.8*  ALBUMIN 2.1* 2.3* 2.2*   No results for input(s): LIPASE, AMYLASE in the last 168 hours. No results for input(s): AMMONIA in the last 168 hours. CBC:  Recent Labs Lab 05/12/14 1000 05/14/14 0409 05/14/14 1439 05/15/14 1527 05/16/14 0250  WBC 15.7* 13.9* 13.2* 12.9* 9.4  NEUTROABS 14.1*  --   --   --   --   HGB 8.6* 9.0* 8.4* 7.9* 7.9*  HCT 28.5* 29.8* 27.5* 26.4* 26.7*  MCV 94.4 97.1 96.2 97.4 94.7  PLT 491* 472* 508* 388 410*   Cardiac Enzymes: No results for input(s): CKTOTAL, CKMB, CKMBINDEX, TROPONINI in the last 168 hours. BNP: BNP (last 3 results)  Recent Labs  02/09/14 1425  PROBNP >70000.0*   CBG:  Recent Labs Lab 05/16/14 0004 05/16/14 0414 05/16/14 0817 05/16/14 1202 05/16/14 1607  GLUCAP 88 73 78 105* 153*       Signed:  Regalado, Belkys A  [facility] 05/17/2014, 2:13 PM

## 2014-05-17 NOTE — Clinical Social Work Psychosocial (Signed)
Clinical Social Work Department BRIEF PSYCHOSOCIAL ASSESSMENT 05/17/2014  Patient:  [name],[name]     Account Number:  [id number]     Admit date:  05/15/2014  Clinical Social Worker:  Delmer IslamRAWFORD,Kennadee Walthour, LCSW  Date/Time:  05/17/2014 04:45 AM  Referred by:  Physician  Date Referred:  05/17/2014 Referred for  SNF Placement   Other Referral:   Interview type:  Patient Other interview type:    PSYCHOSOCIAL DATA Living Status:  FACILITY [facility] Level of care:  Skilled Nursing Facility Primary support name:  Margretta DittySheila Baxter Primary support relationship to patient:  SIBLING Degree of support available:   Patient's daughter is Annye RuskCarol Lyndon [phone]). Sister's phone number is [phone].    CURRENT CONCERNS Current Concerns  Post-Acute Placement   Other Concerns:    SOCIAL WORK ASSESSMENT / PLAN CSW advised that patient will be ready for discharge on Saturday. CSW visited with patient and advised him of same. Patient did indicate that [facility]. Contact made with facility regarding Saturday discharge and d/c summary transmitted to them 1/8 per their request.   Assessment/plan status:  No Further Intervention Required Other assessment/ plan:   Information/referral to community resources:   None needed or requested at this time.    PATIENT'S/FAMILY'S RESPONSE TO PLAN OF CARE: Patient confirmed his intent to [facility] at discharge and facility will be able to accept him.       Genelle BalVanessa Marquinn Meschke, MSW, LCSW Licensed Clinical Social Worker Clinical Social Work Department [facility] [phone]

## 2014-05-17 NOTE — Progress Notes (Signed)
Pt had brief 8 beat run of SVT while sitting. Asymptomatic. No distress noted. Regalado MD notified. Gilman Schmidtembrina, Jadda Hunsucker J

## 2014-05-17 NOTE — Progress Notes (Signed)
Subjective:  Up eating breakfast ,alert and coherant and denies sob.  Objective Vital signs in last 24 hours: Filed Vitals:   05/16/14 1905 05/16/14 2142 05/16/14 2300 05/17/14 0500  BP: 186/98 176/95 127/80 138/80  Pulse: 106 103 91 95  Temp: 98.1 F (36.7 C) 98.1 F (36.7 C)  98.4 F (36.9 C)  TempSrc: Oral Oral  Oral  Resp:  18  18  Weight:  70.2 kg (154 lb 12.2 oz)    SpO2: 100% 97%  94%   Weight change: 0.1 kg (3.5 oz)  Physical Exam: General: Alert OX3 , Nad  Heart: RRR, no mur, gal. rub Lungs:CTA  Abdomen: soft, ND, NT Extremities:No pedal edema with L knee bandage clear and dry  Dialysis Access: pos bruit   HD: TTS [facility] 4h 64kg 400/A1.5 2/2.25 [place] Heparin 5000 LUA AVF Aranesp 160 ug/wk, no other meds   Problem/Plan: 1. Acute resp failure most likely sec to narcotics/withAMS resolved now 2ESRD  k 3.1 eating full renal diet use 4 k bath in am fu labs pre hd 3 HTN-138/80 this am  4 Anemia on aranesp 5. Hyperkalemia, improved 6. Pulm edema, improved/wt 70= bed wt??  With edw 64  Next hd in am on schedule Pt with HO OSA  Refusing  Current Cpap mask wants to use  Nasal cpap = check with Resp therapy  7. 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  He is back to his baseline mentally.  SW will look into whether his bed at NH is still available.  The IV Vanco could be DC'd.  Plan HD in AM first shift as anticipate DC. Damione Robideau T,MD 05/17/2014 9:53 AM Labs: Basic Metabolic Panel:  Recent Labs Lab 05/14/14 1439 05/15/14 1527 05/16/14 0250  NA 128* 136 139  K 5.3* 5.6* 3.5  CL 87* 100 98  CO2 28 23 33*  GLUCOSE 108* 104* 82  BUN 53* 28* 7  CREATININE 7.25* 5.28* 2.32*  CALCIUM 8.7 8.7 7.9*  PHOS 6.8*  6.4* 2.8   Liver Function Tests:  Recent Labs Lab 05/12/14 1000 05/14/14 1439 05/15/14 1527  AST 12  --  710*  ALT 11  --  479*  ALKPHOS 108  --  229*  BILITOT 0.8  --  1.0  PROT 5.8*  --  5.8*  ALBUMIN 2.1* 2.3* 2.2*   CBC:  Recent Labs Lab 05/12/14 1000 05/14/14 0409 05/14/14 1439 05/15/14 1527 05/16/14 0250  WBC 15.7* 13.9* 13.2* 12.9* 9.4  NEUTROABS 14.1*  --   --   --   --   HGB 8.6* 9.0* 8.4* 7.9* 7.9*  HCT 28.5* 29.8* 27.5* 26.4* 26.7*  MCV 94.4 97.1 96.2 97.4 94.7  PLT 491* 472* 508* 388 410*   Cardiac Enzymes: No results for input(s): CKTOTAL, CKMB, CKMBINDEX, TROPONINI in the last 168 hours. CBG:  Recent Labs Lab 05/16/14 0004 05/16/14 0414 05/16/14 0817 05/16/14 1202 05/16/14 1607  GLUCAP 88 73 78 105* 153*    Studies/Results: Dg Chest Port 1 View  05/16/2014   CLINICAL DATA:  Shortness of breath.  EXAM: PORTABLE CHEST - 1 VIEW  COMPARISON:  05/15/2014.  FINDINGS: Mediastinum hilar structures normal. Cardiomegaly with mild pulmonary vascular prominence. Mild interstitial prominence present. Mild  CHF cannot be excluded. Bibasilar atelectasis and/or mild infiltrates. No pleural effusion or pneumothorax.  IMPRESSION: 1. Cardiomegaly with pulmonary venous congestion mild interstitial prominence suggesting mild congestive heart failure. 2. Bibasilar subsegmental atelectasis and/or infiltrates.   Electronically Signed   By: Maisie Fushomas  Register   On: 05/16/2014 07:28   Medications: . sodium chloride 100 mL/hr at 05/17/14 0703   . antiseptic oral rinse  7 mL Mouth Rinse q12n4p  . chlorhexidine  15 mL Mouth Rinse BID  . [START ON 05/18/2014] darbepoetin (ARANESP) injection - DIALYSIS  100 mcg Intravenous Q Sat-HD  . diltiazem  180 mg Oral Daily  . famotidine  20 mg Oral QHS  . folic acid  1 mg Intravenous Daily  . heparin  5,000 Units Subcutaneous 3 times per day  . ipratropium-albuterol  3 mL Nebulization Q6H  . metoprolol tartrate  25 mg Oral BID  .  multivitamin  1 tablet Oral QHS  . thiamine  100 mg Intravenous Daily  . [START ON 05/29/2014] vancomycin  125 mg Oral QODAY  . [START ON 06/06/2014] vancomycin  125 mg Oral Q3 days  . vancomycin  125 mg Oral BID  . [START ON 05/22/2014] vancomycin  125 mg Oral Daily  . [START ON 05/18/2014] vancomycin  750 mg Intravenous Q T,Th,Sa-HD

## 2014-05-18 LAB — CBC
HEMATOCRIT: 24.4 % — AB (ref 39.0–52.0)
HEMOGLOBIN: 7.5 g/dL — AB (ref 13.0–17.0)
MCH: 27.7 pg (ref 26.0–34.0)
MCHC: 30.7 g/dL (ref 30.0–36.0)
MCV: 90 fL (ref 78.0–100.0)
Platelets: 374 10*3/uL (ref 150–400)
RBC: 2.71 MIL/uL — AB (ref 4.22–5.81)
RDW: 16.4 % — AB (ref 11.5–15.5)
WBC: 12.9 10*3/uL — ABNORMAL HIGH (ref 4.0–10.5)

## 2014-05-18 LAB — RENAL FUNCTION PANEL
ALBUMIN: 2.3 g/dL — AB (ref 3.5–5.2)
ANION GAP: 17 — AB (ref 5–15)
BUN: 29 mg/dL — AB (ref 6–23)
CHLORIDE: 92 meq/L — AB (ref 96–112)
CO2: 21 mmol/L (ref 19–32)
CREATININE: 6.31 mg/dL — AB (ref 0.50–1.35)
Calcium: 7.9 mg/dL — ABNORMAL LOW (ref 8.4–10.5)
GFR calc Af Amer: 11 mL/min — ABNORMAL LOW (ref >90)
GFR calc non Af Amer: 9 mL/min — ABNORMAL LOW (ref >90)
GLUCOSE: 113 mg/dL — AB (ref 70–99)
PHOSPHORUS: 2.5 mg/dL (ref 2.3–4.6)
Potassium: 3.5 mmol/L (ref 3.5–5.1)
Sodium: 130 mmol/L — ABNORMAL LOW (ref 135–145)

## 2014-05-18 LAB — VANCOMYCIN, RANDOM: Vancomycin Rm: 24 ug/mL

## 2014-05-18 MED ORDER — DARBEPOETIN ALFA 100 MCG/0.5ML IJ SOSY
PREFILLED_SYRINGE | INTRAMUSCULAR | Status: AC
Start: 2014-05-18 — End: 2014-05-18
  Administered 2014-05-18: 100 ug via INTRAVENOUS
  Filled 2014-05-18: qty 0.5

## 2014-05-18 MED ORDER — VANCOMYCIN 50 MG/ML ORAL SOLUTION: 125.0000 mg | ORAL | Status: AC

## 2014-05-18 MED ORDER — VANCOMYCIN 50 MG/ML ORAL SOLUTION: 125.0000 mg | Freq: Two times a day (BID) | ORAL | Status: AC

## 2014-05-18 MED ORDER — OXYCODONE HCL 5 MG PO TABS
5.0000 mg | ORAL_TABLET | Freq: Once | ORAL | Status: AC
Start: 2014-05-18 — End: 2014-05-18
  Administered 2014-05-18: 5 mg via ORAL

## 2014-05-18 MED ORDER — VANCOMYCIN 50 MG/ML ORAL SOLUTION: 125.0000 mg | Freq: Every day | ORAL | Status: AC

## 2014-05-18 MED ORDER — OXYCODONE HCL 5 MG PO TABS: 5.0000 mg | ORAL_TABLET | Freq: Four times a day (QID) | ORAL | Status: AC | PRN

## 2014-05-18 MED ORDER — OXYCODONE HCL 5 MG PO TABS
ORAL_TABLET | ORAL | Status: AC
  Filled 2014-05-18: qty 1

## 2014-05-18 NOTE — Procedures (Signed)
Pt seen on HD.  AP 80  vp 230.  bfr 400.  Plan for DC today after HD.

## 2014-05-18 NOTE — Clinical Social Work Note (Signed)
CSW made aware patient ready for d/c to [facility]. CSW contacted facility and spoke with Fransisco Beau who confirmed bed availability. CSW prepared d/c packet and placed in patient's shadow chart. CSW met with patient who is agreeable to d/c plan. Per patient, he has already made family and friends aware of d/c to facility. Patient is agreeable to EMS transport to facility. CSW provided RN with number for room and report. CSW to arrange transportation via [place] [facility]. No further needs. CSW signing off.  [place], [place] [facility] Clinical Social Worker [phone]

## 2014-05-18 NOTE — Progress Notes (Signed)
ANTIBIOTIC CONSULT NOTE - FOLLOW-UP CONSULT  Pharmacy Consult for Vancomycin Indication: L Knee Septic Arthritis  Allergies  Allergen Reactions  . Tylenol [Acetaminophen] Nausea Only    Patient Measurements: Actual Body Weight: 70.5 kg  Vital Signs: Temp: 98.9 F (37.2 C) (01/09 0710) Temp Source: Oral (01/09 0710) BP: 150/93 mmHg (01/09 0730) Pulse Rate: 85 (01/09 0730) Intake/Output from previous day: 01/08 0701 - 01/09 0700 In: 560 [P.O.:560] Out: -  Intake/Output from this shift:    Labs:  Recent Labs  05/15/14 1527 05/16/14 0250 05/17/14 0611 05/18/14 0727  WBC 12.9* 9.4  --  12.9*  HGB 7.9* 7.9*  --  7.5*  PLT 388 410*  --  374  CREATININE 5.28* 2.32* 4.70* 6.31*   Estimated Creatinine Clearance: 12.7 mL/min (by C-G formula based on Cr of 6.31).  Recent Labs  05/18/14 0500  VANCORANDOM 24.0     Microbiology: Recent Results (from the past 720 hour(s))  MRSA PCR Screening     Status: None   Collection Time: 05/07/14  6:41 PM  Result Value Ref Range Status   MRSA by PCR NEGATIVE NEGATIVE Final    Comment:        The GeneXpert MRSA Assay (FDA approved for NASAL specimens only), is one component of a comprehensive MRSA colonization surveillance program. It is not intended to diagnose MRSA infection nor to guide or monitor treatment for MRSA infections.   Culture, blood (routine x 2)     Status: None   Collection Time: 05/07/14  9:30 PM  Result Value Ref Range Status   Specimen Description BLOOD RIGHT ARM  Final   Special Requests BOTTLES DRAWN AEROBIC AND ANAEROBIC 10CC  Final   Culture   Final    NO GROWTH 5 DAYS Performed at [facility]    Report Status 05/14/2014 FINAL  Final  Culture, blood (routine x 2)     Status: None   Collection Time: 05/07/14  9:40 PM  Result Value Ref Range Status   Specimen Description BLOOD RIGHT HAND  Final   Special Requests BOTTLES DRAWN AEROBIC AND ANAEROBIC 5CC  Final   Culture   Final    NO  GROWTH 5 DAYS Performed at [facility]    Report Status 05/14/2014 FINAL  Final  Anaerobic culture     Status: None   Collection Time: 05/08/14  6:38 PM  Result Value Ref Range Status   Specimen Description SYNOVIAL FLUID KNEE LEFT  Final   Special Requests SPECIMEN B  Final   Gram Stain   Final    MODERATE WBC PRESENT,BOTH PMN AND MONONUCLEAR NO ORGANISMS SEEN Performed at [facility]    Culture   Final    NO ANAEROBES ISOLATED Performed at [facility]    Report Status 05/13/2014 FINAL  Final  Body fluid culture     Status: None   Collection Time: 05/08/14  6:38 PM  Result Value Ref Range Status   Specimen Description SYNOVIAL FLUID KNEE LEFT  Final   Special Requests SPECIMEN B  Final   Gram Stain   Final    MODERATE WBC PRESENT,BOTH PMN AND MONONUCLEAR NO ORGANISMS SEEN Performed at [facility]    Culture   Final    NO GROWTH 3 DAYS Performed at [facility]    Report Status 05/12/2014 FINAL  Final  Anaerobic culture     Status: None   Collection Time: 05/08/14  6:42 PM  Result Value Ref  Range Status   Specimen Description SYNOVIAL KNEE LEFT  Final   Special Requests SPECIMEN C  Final   Gram Stain   Final    MODERATE WBC PRESENT,BOTH PMN AND MONONUCLEAR NO ORGANISMS SEEN [facility]    Culture   Final    NO ANAEROBES ISOLATED [facility] at [facility]    Report Status 05/13/2014 FINAL  Final  Body fluid culture     Status: None   Collection Time: 05/08/14  6:42 PM  Result Value Ref Range Status   Specimen Description SYNOVIAL KNEE LEFT  Final   Special Requests SPECIMEN C  Final   Gram Stain   Final    MODERATE WBC PRESENT,BOTH PMN AND MONONUCLEAR NO ORGANISMS SEEN [facility]formed [facility]    Culture   Final    NO GROWTH 3 DAYS [facility]formed at [facility]    Report Status 05/12/2014 FINAL  Final  Anaerobic culture     Status: None   Collection Time: 05/08/14   6:44 PM  Result Value Ref Range Status   Specimen Description SYNOVIAL FLUID KNEE LEFT  Final   Special Requests SPECIMEN D  Final   Gram Stain   Final    MODERATE WBC PRESENT,BOTH PMN AND MONONUCLEAR NO ORGANISMS SEEN [facility]    Culture   Final    NO ANAEROBES ISOLATED [facility]formed at [facility]    Report Status 05/13/2014 FINAL  Final  Body fluid culture     Status: None   Collection Time: 05/08/14  6:44 PM  Result Value Ref Range Status   Specimen Description SYNOVIAL FLUID KNEE LEFT  Final   Special Requests SPECIMEN D  Final   Gram Stain   Final    MODERATE WBC PRESENT,BOTH PMN AND MONONUCLEAR NO ORGANISMS SEEN [facility]formed [facility]    Culture   Final    NO GROWTH 3 DAYS [facility]formed at [facility]    Report Status 05/12/2014 FINAL  Final  Anaerobic culture     Status: None   Collection Time: 05/08/14  6:55 PM  Result Value Ref Range Status   Specimen Description SYNOVIAL FLUID KNEE LEFT  Final   Special Requests SPECIMEN E  Final   Gram Stain   Final    MODERATE WBC PRESENT,BOTH PMN AND MONONUCLEAR NO ORGANISMS SEEN [facility]    Culture   Final    NO ANAEROBES ISOLATED [facility]formed at [facility]    Report Status 05/13/2014 FINAL  Final  Body fluid culture     Status: None   Collection Time: 05/08/14  6:55 PM  Result Value Ref Range Status   Specimen Description SYNOVIAL FLUID KNEE LEFT  Final   Special Requests SPECIMEN E  Final   Gram Stain   Final    MODERATE WBC PRESENT,BOTH PMN AND MONONUCLEAR NO ORGANISMS SEEN [facility]formed [facility]    Culture   Final    NO GROWTH 3 DAYS [facility]formed at [facility]    Report Status 05/12/2014 FINAL  Final  Clostridium Difficile by PCR     Status: Abnormal   Collection Time: 05/11/14  7:30 PM  Result Value Ref Range Status   C difficile by pcr POSITIVE (A) NEGATIVE Final    Comment: CRITICAL RESULT CALLED TO, READ  BACK BY AND VERIFIED WITH: L.HOWELL,RN 05/12/14 @1103  BY V.WILKINS   MRSA PCR Screening  Status: None   Collection Time: 05/15/14  4:21 PM  Result Value Ref Range Status   MRSA by PCR NEGATIVE NEGATIVE Final    Comment:        The GeneXpert MRSA Assay (FDA approved for NASAL specimens only), is one component of a comprehensive MRSA colonization surveillance program. It is not intended to diagnose MRSA infection nor to guide or monitor treatment for MRSA infections.   Culture, blood (routine x 2)     Status: None (Preliminary result)   Collection Time: 05/15/14  5:45 PM  Result Value Ref Range Status   Specimen Description BLOOD HEMODIALYSIS CATHETER  Final   Special Requests BOTTLES DRAWN AEROBIC AND ANAEROBIC  Final   Culture   Final           BLOOD CULTURE RECEIVED NO GROWTH TO DATE CULTURE WILL BE HELD FOR 5 DAYS BEFORE ISSUING A FINAL NEGATIVE REPORT Performed at [facility]    Report Status PENDING  Incomplete  Culture, blood (routine x 2)     Status: None (Preliminary result)   Collection Time: 05/15/14  6:00 PM  Result Value Ref Range Status   Specimen Description BLOOD HEMODIALYSIS CATHETER  Final   Special Requests BOTTLES DRAWN AEROBIC AND ANAEROBIC 10CC  Final   Culture   Final           BLOOD CULTURE RECEIVED NO GROWTH TO DATE CULTURE WILL BE HELD FOR 5 DAYS BEFORE ISSUING A FINAL NEGATIVE REPORT Performed at [facility]    Report Status PENDING  Incomplete    Medical History: Past Medical History  Diagnosis Date  . Hypertension   . Systolic heart failure     EF 16-10%  . Hyperlipidemia   . COPD (chronic obstructive pulmonary disease)   . Peripheral vascular disease   . Pneumonia 1980's  . OSA on CPAP   . ETOH abuse   . History of stomach ulcers   . Arthritis     "arms"  . Chronic lower back pain   . Anxiety   . Depression   . Adult ADHD     "never diagnosed; my son was; I think I've got it too"  . ESRD on hemodialysis    . Renal cyst     left, complex  . Renal artery stenosis     a. s/p BMS to inferior branch of right renal artery 12/2011. On left, 3 renal arteries were noted, 2 of them were very small in size and subtotally occluded  . Anginal pain   . CHF (congestive heart failure)   . Seasonal allergies   . Hepatitis     "I think it was C"  . Septic joint of left knee joint 05/08/2014    Medications:  Prescriptions prior to admission  Medication Sig Dispense Refill Last Dose  . albuterol (PROVENTIL HFA;VENTOLIN HFA) 108 (90 BASE) MCG/ACT inhaler Inhale 2 puffs into the lungs every 6 (six) hours as needed for wheezing or shortness of breath.   05/15/2014 at Unknown time  . ALPRAZolam (XANAX) 0.5 MG tablet Take 1 tablet (0.5 mg total) by mouth 3 (three) times daily as needed for anxiety. 20 tablet 0 05/15/2014 at Unknown time  . aspirin EC 325 MG EC tablet Take 1 tablet (325 mg total) by mouth daily. 30 tablet 2 05/15/2014  . budesonide-formoterol (SYMBICORT) 80-4.5 MCG/ACT inhaler Inhale 2 puffs into the lungs 2 (two) times daily. 1 Inhaler 1 05/15/2014  . calcium acetate (PHOSLO) 667 MG  capsule Take 1 capsule (667 mg total) by mouth 3 (three) times daily with meals. 90 capsule 1 05/15/2014  . calcium carbonate (TUMS - DOSED IN MG ELEMENTAL CALCIUM) 500 MG chewable tablet Chew 1 tablet (200 mg of elemental calcium total) by mouth 3 (three) times daily as needed for indigestion or heartburn. 90 tablet 1 unknown  . diltiazem (CARDIZEM CD) 180 MG 24 hr capsule Take 1 capsule (180 mg total) by mouth daily. 60 capsule 3 05/15/2014  . escitalopram (LEXAPRO) 10 MG tablet Take 1 tablet (10 mg total) by mouth daily. 30 tablet 2 05/15/2014  . famotidine (PEPCID) 20 MG tablet Take 1 tablet (20 mg total) by mouth 2 (two) times daily. 60 tablet 2 05/15/2014  . gabapentin (NEURONTIN) 100 MG capsule Take 2 capsules (200 mg total) by mouth 2 (two) times daily. 120 capsule 0 05/15/2014  . metoprolol tartrate (LOPRESSOR) 25 MG tablet Take 1  tablet (25 mg total) by mouth 2 (two) times daily. 60 tablet 2 05/15/2014  . morphine (MS CONTIN) 15 MG 12 hr tablet Take 1 tablet (15 mg total) by mouth every 12 (twelve) hours. 20 tablet 0 05/15/2014  . ondansetron (ZOFRAN-ODT) 4 MG disintegrating tablet Take 4 mg by mouth every 6 (six) hours as needed for nausea.   0 unknown  . oxyCODONE (OXY IR/ROXICODONE) 5 MG immediate release tablet Take 1 tablet (5 mg total) by mouth every 6 (six) hours as needed. (Patient taking differently: Take 5 mg by mouth every 6 (six) hours as needed (pain). ) 20 tablet 0 unknown  . roflumilast (DALIRESP) 500 MCG TABS tablet Take 500 mcg by mouth daily.   05/15/2014  . senna (SENOKOT) 8.6 MG TABS tablet Take 1 tablet (8.6 mg total) by mouth 2 (two) times daily. 120 each 0 05/15/2014  . thiamine 100 MG tablet Take 1 tablet (100 mg total) by mouth daily. 30 tablet 3 05/15/2014  . tiotropium (SPIRIVA) 18 MCG inhalation capsule Place 18 mcg into inhaler and inhale daily.   05/15/2014  . traZODone (DESYREL) 25 mg TABS tablet Take 0.5 tablets (25 mg total) by mouth at bedtime as needed for sleep. 30 tablet 0 unknown  . vancomycin (VANCOCIN) 50 mg/mL oral solution Take 2.5 mLs (125 mg total) by mouth 4 (four) times daily. 70 mL 0 05/15/2014  . Vancomycin (VANCOCIN) 750 MG/150ML SOLN Inject 150 mLs (750 mg total) into the vein Every Tuesday,Thursday,and Saturday with dialysis. 150 mL 0 unknown  . vitamin C (ASCORBIC ACID) 500 MG tablet Take 500 mg by mouth daily.   05/15/2014  . vancomycin (VANCOCIN) 50 mg/mL oral solution Take 2.5 mLs (125 mg total) by mouth 2 (two) times daily. 35 mL 0 not started  . [START ON 05/22/2014] vancomycin (VANCOCIN) 50 mg/mL oral solution Take 2.5 mLs (125 mg total) by mouth daily. 17.5 mL 0 not started  . [START ON 05/29/2014] vancomycin (VANCOCIN) 50 mg/mL oral solution Take 2.5 mLs (125 mg total) by mouth every other day. 10 mL 0 not started  . [START ON 06/06/2014] vancomycin (VANCOCIN) 50 mg/mL oral solution  Take 2.5 mLs (125 mg total) by mouth every 3 (three) days. 5 mL 0 not started   Scheduled:  . antiseptic oral rinse  7 mL Mouth Rinse q12n4p  . chlorhexidine  15 mL Mouth Rinse BID  . darbepoetin (ARANESP) injection - DIALYSIS  100 mcg Intravenous Q Sat-HD  . diltiazem  180 mg Oral Daily  . famotidine  20 mg Oral QHS  .  folic acid  1 mg Oral Daily  . heparin  5,000 Units Subcutaneous 3 times per day  . ipratropium-albuterol  3 mL Nebulization Q6H  . metoprolol tartrate  25 mg Oral BID  . multivitamin  1 tablet Oral QHS  . thiamine  100 mg Oral Daily  . [START ON 05/29/2014] vancomycin  125 mg Oral QODAY  . [START ON 06/06/2014] vancomycin  125 mg Oral Q3 days  . vancomycin  125 mg Oral BID  . [START ON 05/22/2014] vancomycin  125 mg Oral Daily  . vancomycin  750 mg Intravenous Q T,Th,Sa-HD   Assessment: 54yo male with history of ESRD on HD TThSat presented with AMS after being discharged 05/14/14 from septic arthritis in L knee, C.diff, and Afib with RVR. Pharmacy is consulted to dose vancomycin for L knee septic arthritis. Pt is afebrile, WBC 12.9.  Vanc 12/31 >> (2/11 - complete 6 weeks)  1/9 Pre-HD vanc level: 24 (goal 15-25)  Goal of Therapy:  Vancomycin pre-HD level 15-25 mcg/ml  Plan:  Continue vancomycin  IV qHD (TThSat) Monitor C&S, clinical improvement, changes to HD schedule  Waynette Buttery, PharmD Clinical Pharmacy Resident Pager: [phone] 05/18/2014 8:55 AM

## 2014-05-18 NOTE — Discharge Summary (Signed)
Physician Discharge Summary  RUTH TULLY VHQ:[id number] DOB: 29-Nov-1960 DOA: 05/15/2014  PCP: Wilmer Floor., MD  Admit date: 05/15/2014 Discharge date: 05/18/2014  Time spent: 35 minutes  Recommendations for Outpatient Follow-up:  1. Needs to follow up with ID clinic in 2 weeks.  2. Needs to follow up with Dr Dion Saucier in 1 week 3. Careful with narcotics, opioid to avoid oversedation  Discharge Diagnoses:    Encephalopathy secondary to sedatives.     Acute respiratory failure with hypercapnia   COPD (chronic obstructive pulmonary disease)   OSA (obstructive sleep apnea)   Clostridium difficile colitis   Septic joint of left knee joint   End-stage renal disease on hemodialysis   HCAP (healthcare-associated pneumonia)   Acidosis    Discharge Condition: Stable  Diet recommendation: renal diet.   Filed Weights   05/16/14 2142 05/17/14 2010 05/18/14 0710  Weight: 70.2 kg (154 lb 12.2 oz) 70.5 kg (155 lb 6.8 oz) 70 kg (154 lb 5.2 oz)    History of present illness:  54yo male active smoker with hx ETOH, uncontrolled HTN, COPD, diastolic CHF, OSA on CPAP, ESRD (T, Th, S) just d/c 1/5 to SNF after admission for septic arthritis L knee, C Diff, AFib RVR. He was d/c 1/5 to SNF rehab with plans for 6 weeks of IV vanc (on HD days). On am 1/6 he was found lethargic at SNF, minimal response to narcan. Was taken to [facility] ER, placed on bipap and tx to [facility].  Hospital Course:  1-Acute Respiratory Failure; Likely secondary to opioid in setting ESRD. History of COPD, decompensated OSA. Component of Heart failure, pulmonary edema.  -improved on BIPAP.  -Continue with negative balance.   2-Diastolic Heart Failure, A fib;  Continue with dialysis for negative balance.  Continue with metoprolol and Cardizem.  Was not on anticoagulation previously due to history alcoholism, medications compliance and anemia. His episode of tachycardia, a flutter are related to acute illness. He will  need to follow up with cardiology for further management  3-ESRD;  Continue with dialysis. Plan for dialysis today after dialysis   VT; replete k, continue with metoprolol. Mg at 2.2  Encephalopathy; likely secondary to oversedation. Resolved. Received narcan.   Recent C diff; On vancomycin day 4 of 125 mg PO TID for 1 week.  Follow taper for oral Vancomycin: oral vancomycin taper for C Diff. Vancomycin 125 mg PO TID for 1 week, followed by 125 mg PO BID for 1 week followed by 125 mg PO q daily for 1 week, followed by 125 mg PO every other day for 1 week, follwed by 125 mg Po every 3 days for 1 week then stop.   Anemia; continue with aranesp.   Left Knee septic arthritis;  Continue with IV vancomycin, planned for 6 weeks.  Careful with opioids and sedatives.   Procedures:  dialysis  Consultations:  Nephrologist.  CCM  Discharge Exam: Filed Vitals:   05/18/14 1100  BP: 143/79  Pulse: 91  Temp:   Resp:     General: no distress.  Cardiovascular: S 1, S 2 RRR Respiratory: CTA  Discharge Instructions   Discharge Instructions    Diet - low sodium heart healthy    Complete by:  As directed      Increase activity slowly    Complete by:  As directed           Current Discharge Medication List    START taking these medications   Details  !! vancomycin (  VANCOCIN) 50 mg/mL oral solution Take 2.5 mLs (125 mg total) by mouth every other day. Qty: 300 mL, Refills: 0    !! vancomycin (VANCOCIN) 50 mg/mL oral solution Take 2.5 mLs (125 mg total) by mouth every 3 (three) days. Qty: 300 mL, Refills: 0    !! vancomycin (VANCOCIN) 50 mg/mL oral solution Take 2.5 mLs (125 mg total) by mouth 2 (two) times daily. Qty: 300 mL, Refills: 0    !! vancomycin (VANCOCIN) 50 mg/mL oral solution Take 2.5 mLs (125 mg total) by mouth daily. Qty: 300 mL, Refills: 0     !! - Potential duplicate medications found. Please discuss with provider.    CONTINUE these medications which  have CHANGED   Details  oxyCODONE (OXY IR/ROXICODONE) 5 MG immediate release tablet Take 1 tablet (5 mg total) by mouth every 6 (six) hours as needed for severe pain. Qty: 30 tablet, Refills: 0      CONTINUE these medications which have NOT CHANGED   Details  albuterol (PROVENTIL HFA;VENTOLIN HFA) 108 (90 BASE) MCG/ACT inhaler Inhale 2 puffs into the lungs every 6 (six) hours as needed for wheezing or shortness of breath.    aspirin EC 325 MG EC tablet Take 1 tablet (325 mg total) by mouth daily. Qty: 30 tablet, Refills: 2    budesonide-formoterol (SYMBICORT) 80-4.5 MCG/ACT inhaler Inhale 2 puffs into the lungs 2 (two) times daily. Qty: 1 Inhaler, Refills: 1    calcium acetate (PHOSLO) 667 MG capsule Take 1 capsule (667 mg total) by mouth 3 (three) times daily with meals. Qty: 90 capsule, Refills: 1    calcium carbonate (TUMS - DOSED IN MG ELEMENTAL CALCIUM) 500 MG chewable tablet Chew 1 tablet (200 mg of elemental calcium total) by mouth 3 (three) times daily as needed for indigestion or heartburn. Qty: 90 tablet, Refills: 1    diltiazem (CARDIZEM CD) 180 MG 24 hr capsule Take 1 capsule (180 mg total) by mouth daily. Qty: 60 capsule, Refills: 3    famotidine (PEPCID) 20 MG tablet Take 1 tablet (20 mg total) by mouth 2 (two) times daily. Qty: 60 tablet, Refills: 2    metoprolol tartrate (LOPRESSOR) 25 MG tablet Take 1 tablet (25 mg total) by mouth 2 (two) times daily. Qty: 60 tablet, Refills: 2    ondansetron (ZOFRAN-ODT) 4 MG disintegrating tablet Take 4 mg by mouth every 6 (six) hours as needed for nausea.  Refills: 0    roflumilast (DALIRESP) 500 MCG TABS tablet Take 500 mcg by mouth daily.    senna (SENOKOT) 8.6 MG TABS tablet Take 1 tablet (8.6 mg total) by mouth 2 (two) times daily. Qty: 120 each, Refills: 0    thiamine 100 MG tablet Take 1 tablet (100 mg total) by mouth daily. Qty: 30 tablet, Refills: 3    tiotropium (SPIRIVA) 18 MCG inhalation capsule Place 18 mcg  into inhaler and inhale daily.    traZODone (DESYREL) 25 mg TABS tablet Take 0.5 tablets (25 mg total) by mouth at bedtime as needed for sleep. Qty: 30 tablet, Refills: 0    !! vancomycin (VANCOCIN) 50 mg/mL oral solution Take 2.5 mLs (125 mg total) by mouth 4 (four) times daily. Qty: 70 mL, Refills: 0    Vancomycin (VANCOCIN) 750 MG/150ML SOLN Inject 150 mLs (750 mg total) into the vein Every Tuesday,Thursday,and Saturday with dialysis. Qty: 150 mL, Refills: 0    vitamin C (ASCORBIC ACID) 500 MG tablet Take 500 mg by mouth daily.    !! vancomycin (  VANCOCIN) 50 mg/mL oral solution Take 2.5 mLs (125 mg total) by mouth 2 (two) times daily. Qty: 35 mL, Refills: 0    !! vancomycin (VANCOCIN) 50 mg/mL oral solution Take 2.5 mLs (125 mg total) by mouth daily. Qty: 17.5 mL, Refills: 0    !! vancomycin (VANCOCIN) 50 mg/mL oral solution Take 2.5 mLs (125 mg total) by mouth every other day. Qty: 10 mL, Refills: 0    !! vancomycin (VANCOCIN) 50 mg/mL oral solution Take 2.5 mLs (125 mg total) by mouth every 3 (three) days. Qty: 5 mL, Refills: 0     !! - Potential duplicate medications found. Please discuss with provider.    STOP taking these medications     ALPRAZolam (XANAX) 0.5 MG tablet      escitalopram (LEXAPRO) 10 MG tablet      gabapentin (NEURONTIN) 100 MG capsule      morphine (MS CONTIN) 15 MG 12 hr tablet        Allergies  Allergen Reactions  . Tylenol [Acetaminophen] Nausea Only   Follow-up Information    Follow up with Wilmer Floor., MD.   Specialty:  Internal Medicine   Contact information:   [address]  [place] [address] [phone]       Follow up with Eulas Post, MD In 1 week.   Specialty:  Orthopedic Surgery   Contact information:   [address] [place] [address] [phone]        The results of significant diagnostics from this hospitalization (including imaging, microbiology, ancillary  and laboratory) are listed below for reference.    Significant Diagnostic Studies: Mr Knee Left  Wo Contrast  05/08/2014   CLINICAL DATA:  Dialysis patient with joint effusion. Evaluate for infection versus inflammatory process.  EXAM: MRI OF THE LEFT KNEE WITHOUT CONTRAST  TECHNIQUE: Multiplanar, multisequence MR imaging of the knee was performed. No intravenous contrast was administered.  COMPARISON:  Lower extremity venous Doppler ultrasound 04/08/2014. Radiographs 05/08/2014.  FINDINGS: MENISCI  Medial meniscus: Minimal undersurface irregularity in the posterior horn without discrete tear or displaced meniscal fragment.  Lateral meniscus:  Intact with normal morphology.  LIGAMENTS  Cruciates:  Intact.  Collaterals:  Intact.  CARTILAGE  Patellofemoral: There is mild patellar cartilage surface irregularity and thinning without full-thickness defect. There is lateral trochlear chondromalacia and subchondral collapse related to an underlying bony infarct, best seen on the sagittal images.  Medial:  Preserved.  Lateral:  Preserved.  Joint: There is a moderate to large complex knee joint effusion with diffuse synovial irregularity. There are multiple serpiginous filling defects within the suprapatellar recess. No discrete loose bodies observed.  Popliteal Fossa: There is a large complex Baker's cyst measuring up to 7.9 cm cephalocaudad and 4.7 cm transverse. There is subcutaneous edema surrounding the knee, greatest laterally. No other focal fluid collection is demonstrated.  Extensor Mechanism: Intact. There is mild edema within Hoffa's fat.  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 As above, there is some subchondral collapse of the lateral femoral condyle anteriorly. The bone infarct involving the posterior aspect of the medial femoral condyle is associated with surrounding marrow edema and may be subacute. There is no evidence of acute fracture or  bone destruction.  IMPRESSION: 1. Large complex knee joint effusion and Baker's cyst consistent with synovitis. Joint aspiration recommended if there is clinical concern of infection. No evidence of osteomyelitis. 2. 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 There is subchondral collapse of the lateral femoral condyle anteriorly with associated chondral irregularity. 3. Intact menisci and ligaments.   Electronically Signed   By: Roxy Horseman M.D.   On: 05/08/2014 11:08   Dg Chest Port 1 View  05/16/2014   CLINICAL DATA:  Shortness of breath.  EXAM: PORTABLE CHEST - 1 VIEW  COMPARISON:  05/15/2014.  FINDINGS: Mediastinum hilar structures normal. Cardiomegaly with mild pulmonary vascular prominence. Mild interstitial prominence present. Mild CHF cannot be excluded. Bibasilar atelectasis and/or mild infiltrates. No pleural effusion or pneumothorax.  IMPRESSION: 1. Cardiomegaly with pulmonary venous congestion mild interstitial prominence suggesting mild congestive heart failure. 2. Bibasilar subsegmental atelectasis and/or infiltrates.   Electronically Signed   By: Maisie Fus  Register   On: 05/16/2014 07:28   Dg Knee Complete 4 Views Left  05/08/2014   CLINICAL DATA:  54 year old male with left-sided knee pain. No known injury.  EXAM: LEFT KNEE - COMPLETE 4+ VIEW  COMPARISON:  No priors.  FINDINGS: In the lateral femoral condyle and supracondylar region there are irregular areas of sclerosis. The possibility of chondroid matrix is not excluded in these regions. The articular surface of the lateral femoral condyle appears intact without definite depression. There may be some overlying periosteal reaction. No displaced fracture, subluxation or dislocation.  IMPRESSION: 1. Large area of sclerosis in the lateral femoral condyle and supracondylar region. The overall appearance of this is favored to reflect a large bone infarct, however, the possibility of a chondroid lesion is not excluded,  particularly in light of the potential periosteal reaction. Further characterization with nonemergent MRI of the knee with and without IV gadolinium in the near future is recommended.   Electronically Signed   By: Trudie Reed M.D.   On: 05/08/2014 01:00    Microbiology: Recent Results (from the past 240 hour(s))  Anaerobic culture     Status: None   Collection Time: 05/08/14  6:38 PM  Result Value Ref Range Status   Specimen Description SYNOVIAL FLUID KNEE LEFT  Final   Special Requests SPECIMEN B  Final   Gram Stain   Final    MODERATE WBC PRESENT,BOTH PMN AND MONONUCLEAR NO ORGANISMS SEEN Performed at [facility]    Culture   Final    NO ANAEROBES ISOLATED Performed at [facility]    Report Status 05/13/2014 FINAL  Final  Body fluid culture     Status: None   Collection Time: 05/08/14  6:38 PM  Result Value Ref Range Status   Specimen Description SYNOVIAL FLUID KNEE LEFT  Final   Special Requests SPECIMEN B  Final   Gram Stain   Final    MODERATE WBC PRESENT,BOTH PMN AND MONONUCLEAR NO ORGANISMS SEEN Performed at [facility]    Culture   Final    NO GROWTH 3 DAYS Performed at [facility]    Report Status 05/12/2014 FINAL  Final  Anaerobic culture     Status: None   Collection Time: 05/08/14  6:42 PM  Result Value Ref Range Status   Specimen Description SYNOVIAL KNEE LEFT  Final   Special Requests SPECIMEN C  Final   Gram Stain   Final    MODERATE WBC PRESENT,BOTH PMN AND MONONUCLEAR NO ORGANISMS SEEN Performed at [facility]    Culture   Final    NO ANAEROBES ISOLATED Performed at [facility]    Report Status 05/13/2014 FINAL  Final  Body fluid  culture     Status: None   Collection Time: 05/08/14  6:42 PM  Result Value Ref Range Status   Specimen Description SYNOVIAL KNEE LEFT  Final   Special Requests SPECIMEN C  Final   Gram Stain   Final    MODERATE WBC PRESENT,BOTH PMN AND MONONUCLEAR NO  ORGANISMS SEEN Performed at [facility]    Culture   Final    NO GROWTH 3 DAYS Performed at [facility]    Report Status 05/12/2014 FINAL  Final  Anaerobic culture     Status: None   Collection Time: 05/08/14  6:44 PM  Result Value Ref Range Status   Specimen Description SYNOVIAL FLUID KNEE LEFT  Final   Special Requests SPECIMEN D  Final   Gram Stain   Final    MODERATE WBC PRESENT,BOTH PMN AND MONONUCLEAR NO ORGANISMS SEEN Performed at [facility]    Culture   Final    NO ANAEROBES ISOLATED Performed at [facility]    Report Status 05/13/2014 FINAL  Final  Body fluid culture     Status: None   Collection Time: 05/08/14  6:44 PM  Result Value Ref Range Status   Specimen Description SYNOVIAL FLUID KNEE LEFT  Final   Special Requests SPECIMEN D  Final   Gram Stain   Final    MODERATE WBC PRESENT,BOTH PMN AND MONONUCLEAR NO ORGANISMS SEEN Performed at [facility]    Culture   Final    NO GROWTH 3 DAYS Performed at [facility]    Report Status 05/12/2014 FINAL  Final  Anaerobic culture     Status: None   Collection Time: 05/08/14  6:55 PM  Result Value Ref Range Status   Specimen Description SYNOVIAL FLUID KNEE LEFT  Final   Special Requests SPECIMEN E  Final   Gram Stain   Final    MODERATE WBC PRESENT,BOTH PMN AND MONONUCLEAR NO ORGANISMS SEEN Performed at [facility]    Culture   Final    NO ANAEROBES ISOLATED Performed at [facility]    Report Status 05/13/2014 FINAL  Final  Body fluid culture     Status: None   Collection Time: 05/08/14  6:55 PM  Result Value Ref Range Status   Specimen Description SYNOVIAL FLUID KNEE LEFT  Final   Special Requests SPECIMEN E  Final   Gram Stain   Final    MODERATE WBC PRESENT,BOTH PMN AND MONONUCLEAR NO ORGANISMS SEEN Performed at [facility]    Culture   Final    NO GROWTH 3 DAYS Performed at [facility]    Report Status  05/12/2014 FINAL  Final  Clostridium Difficile by PCR     Status: Abnormal   Collection Time: 05/11/14  7:30 PM  Result Value Ref Range Status   C difficile by pcr POSITIVE (A) NEGATIVE Final    Comment: CRITICAL RESULT CALLED TO, READ BACK BY AND VERIFIED WITH: L.HOWELL,RN 05/12/14 @1103  BY V.WILKINS   MRSA PCR Screening     Status: None   Collection Time: 05/15/14  4:21 PM  Result Value Ref Range Status   MRSA by PCR NEGATIVE NEGATIVE Final    Comment:        The GeneXpert MRSA Assay (FDA approved for NASAL specimens only), is one component of a comprehensive MRSA colonization surveillance program. It is not intended to diagnose MRSA infection nor to guide or monitor treatment for MRSA infections.  Culture, blood (routine x 2)     Status: None (Preliminary result)   Collection Time: 05/15/14  5:45 PM  Result Value Ref Range Status   Specimen Description BLOOD HEMODIALYSIS CATHETER  Final   Special Requests BOTTLES DRAWN AEROBIC AND ANAEROBIC 10ML  Final   Culture   Final           BLOOD CULTURE RECEIVED NO GROWTH TO DATE CULTURE WILL BE HELD FOR 5 DAYS BEFORE ISSUING A FINAL NEGATIVE REPORT [facility]    Report Status PENDING  Incomplete  Culture, blood (routine x 2)     Status: None (Preliminary result)   Collection Time: 05/15/14  6:00 PM  Result Value Ref Range Status   Specimen Description BLOOD HEMODIALYSIS CATHETER  Final   Special Requests BOTTLES DRAWN AEROBIC AND ANAEROBIC 10CC  Final   Culture   Final           BLOOD CULTURE RECEIVED NO GROWTH TO DATE CULTURE WILL BE HELD FOR 5 DAYS BEFORE ISSUING A FINAL NEGATIVE REPORT Performed at [facility]    Report Status PENDING  Incomplete     Labs: Basic Metabolic Panel:  Recent Labs Lab 05/12/14 1000  05/14/14 1439 05/15/14 1527 05/16/14 0250 05/17/14 0611 05/18/14 0727  NA 133*  < > 128* 136 139 134* 130*  K 4.3  < > 5.3* 5.6* 3.5 3.1* 3.5  CL 94*  < > 87* 100 98 94*  92*  CO2 28  < > 28 23 33* 25 21  GLUCOSE 105*  < > 108* 104* 82 95 113*  BUN 26*  < > 53* 28* 7 22 29*  CREATININE 4.32*  < > 7.25* 5.28* 2.32* 4.70* 6.31*  CALCIUM 8.5  < > 8.7 8.7 7.9* 8.1* 7.9*  MG 2.3  --   --  2.6* 2.1 2.2  --   PHOS  --   --  6.8* 6.4* 2.8  --  2.5  < > = values in this interval not displayed. Liver Function Tests:  Recent Labs Lab 05/12/14 1000 05/14/14 1439 05/15/14 1527 05/18/14 0727  AST 12  --  710*  --   ALT 11  --  479*  --   ALKPHOS 108  --  229*  --   BILITOT 0.8  --  1.0  --   PROT 5.8*  --  5.8*  --   ALBUMIN 2.1* 2.3* 2.2* 2.3*   No results for input(s): LIPASE, AMYLASE in the last 168 hours. No results for input(s): AMMONIA in the last 168 hours. CBC:  Recent Labs Lab 05/12/14 1000 05/14/14 0409 05/14/14 1439 05/15/14 1527 05/16/14 0250 05/18/14 0727  WBC 15.7* 13.9* 13.2* 12.9* 9.4 12.9*  NEUTROABS 14.1*  --   --   --   --   --   HGB 8.6* 9.0* 8.4* 7.9* 7.9* 7.5*  HCT 28.5* 29.8* 27.5* 26.4* 26.7* 24.4*  MCV 94.4 97.1 96.2 97.4 94.7 90.0  PLT 491* 472* 508* 388 410* 374   Cardiac Enzymes: No results for input(s): CKTOTAL, CKMB, CKMBINDEX, TROPONINI in the last 168 hours. BNP: BNP (last 3 results)  Recent Labs  02/09/14 1425  PROBNP >70000.0*   CBG:  Recent Labs Lab 05/16/14 0004 05/16/14 0414 05/16/14 0817 05/16/14 1202 05/16/14 1607  GLUCAP 88 73 78 105* 153*       Signed:  Regalado, Belkys A  [facility] 05/18/2014, 11:03 AM

## 2014-05-22 LAB — CULTURE, BLOOD (ROUTINE X 2)
CULTURE: NO GROWTH
CULTURE: NO GROWTH

## 2014-06-10 DEATH — deceased

## 2016-04-21 IMAGING — CR DG CHEST 1V PORT
1 series · 1 of 1 positions shown · non-contrast
Comparison: 01/14/2014

CLINICAL DATA: Acute chest pain with shortness of breath, history
of hypertension and systolic heart failure as well as peripheral
vascular disease, alcohol abuse, gastric ulcers, anxiety and
depression with chronic renal disease and personal history of
congestive heart failure

EXAM:
PORTABLE CHEST - 1 VIEW

[AP]
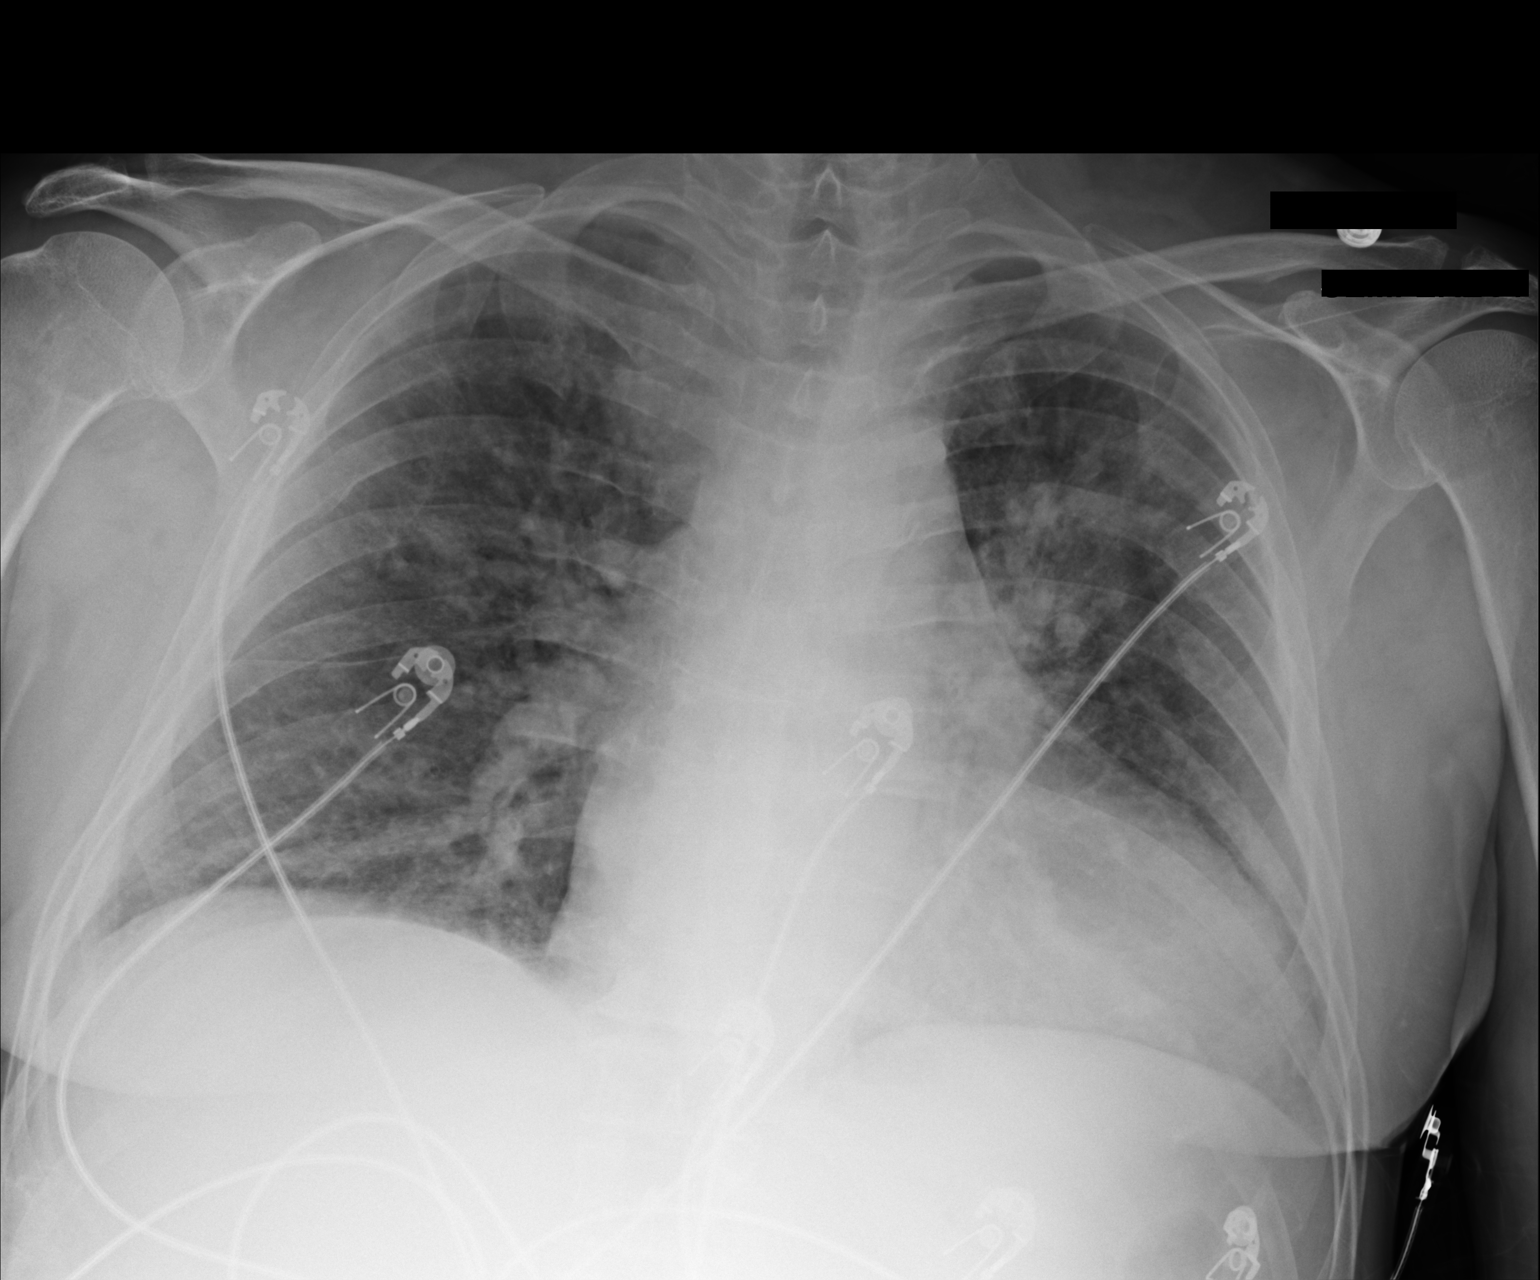

[1 of 1 positions shown; findings below may reference images not displayed]

FINDINGS: Moderate cardiac enlargement. Moderate vascular congestion. Mild
peribronchial cuffing and interstitial prominence. More focal hazy
left suprahilar opacity. No pleural effusions.
IMPRESSION: Findings suggest mild cardiogenic interstitial pulmonary edema.
Recommend radiographic follow-up to ensure resolution of left
suprahilar opacity which likely represents mildly asymmetric edema.

## 2022-08-25 ENCOUNTER — Other Ambulatory Visit (HOSPITAL_COMMUNITY): Payer: Self-pay
